# Patient Record
Sex: Female | Born: 1955 | Race: White | Hispanic: No | Marital: Married | Smoking: Never smoker
Health system: Southern US, Community
[De-identification: ages and names within clinical notes are randomized; demographics above are authoritative.]

## PROBLEM LIST (undated history)

## (undated) DIAGNOSIS — R112 Nausea with vomiting, unspecified: Secondary | ICD-10-CM

## (undated) DIAGNOSIS — J45909 Unspecified asthma, uncomplicated: Secondary | ICD-10-CM

## (undated) DIAGNOSIS — H269 Unspecified cataract: Secondary | ICD-10-CM

## (undated) DIAGNOSIS — Z87442 Personal history of urinary calculi: Secondary | ICD-10-CM

## (undated) DIAGNOSIS — R32 Unspecified urinary incontinence: Secondary | ICD-10-CM

## (undated) DIAGNOSIS — M1990 Unspecified osteoarthritis, unspecified site: Secondary | ICD-10-CM

## (undated) DIAGNOSIS — T8859XA Other complications of anesthesia, initial encounter: Secondary | ICD-10-CM

## (undated) DIAGNOSIS — Z9889 Other specified postprocedural states: Secondary | ICD-10-CM

## (undated) DIAGNOSIS — B019 Varicella without complication: Secondary | ICD-10-CM

## (undated) DIAGNOSIS — T7840XA Allergy, unspecified, initial encounter: Secondary | ICD-10-CM

## (undated) DIAGNOSIS — K219 Gastro-esophageal reflux disease without esophagitis: Secondary | ICD-10-CM

## (undated) DIAGNOSIS — G43909 Migraine, unspecified, not intractable, without status migrainosus: Secondary | ICD-10-CM

## (undated) HISTORY — DX: Unspecified urinary incontinence: R32

## (undated) HISTORY — DX: Unspecified osteoarthritis, unspecified site: M19.90

## (undated) HISTORY — PX: OTHER SURGICAL HISTORY: SHX169

## (undated) HISTORY — DX: Migraine, unspecified, not intractable, without status migrainosus: G43.909

## (undated) HISTORY — PX: BREAST CYST ASPIRATION: SHX578

## (undated) HISTORY — DX: Gastro-esophageal reflux disease without esophagitis: K21.9

## (undated) HISTORY — DX: Unspecified asthma, uncomplicated: J45.909

## (undated) HISTORY — DX: Allergy, unspecified, initial encounter: T78.40XA

## (undated) HISTORY — DX: Unspecified cataract: H26.9

## (undated) HISTORY — PX: EYE SURGERY: SHX253

## (undated) HISTORY — DX: Varicella without complication: B01.9

---

## 1985-03-24 HISTORY — PX: VAGINAL HYSTERECTOMY: SUR661

## 1990-03-24 HISTORY — PX: NASAL SINUS SURGERY: SHX719

## 1992-03-24 HISTORY — PX: ROTATOR CUFF REPAIR: SHX139

## 1997-03-24 HISTORY — PX: ELBOW SURGERY: SHX618

## 1997-03-24 HISTORY — PX: HAND SURGERY: SHX662

## 1997-10-12 ENCOUNTER — Ambulatory Visit (HOSPITAL_BASED_OUTPATIENT_CLINIC_OR_DEPARTMENT_OTHER): Admission: RE | Admit: 1997-10-12 | Discharge: 1997-10-12 | Payer: Self-pay | Admitting: Orthopedic Surgery

## 1997-11-23 ENCOUNTER — Ambulatory Visit (HOSPITAL_BASED_OUTPATIENT_CLINIC_OR_DEPARTMENT_OTHER): Admission: RE | Admit: 1997-11-23 | Discharge: 1997-11-23 | Payer: Self-pay | Admitting: Orthopedic Surgery

## 1998-04-13 ENCOUNTER — Ambulatory Visit (HOSPITAL_BASED_OUTPATIENT_CLINIC_OR_DEPARTMENT_OTHER): Admission: RE | Admit: 1998-04-13 | Discharge: 1998-04-13 | Payer: Self-pay | Admitting: Orthopedic Surgery

## 2004-10-18 ENCOUNTER — Ambulatory Visit: Payer: Self-pay | Admitting: Unknown Physician Specialty

## 2004-11-04 ENCOUNTER — Ambulatory Visit: Payer: Self-pay | Admitting: Unknown Physician Specialty

## 2005-02-07 ENCOUNTER — Ambulatory Visit: Payer: Self-pay | Admitting: Internal Medicine

## 2005-04-14 ENCOUNTER — Ambulatory Visit: Payer: Self-pay | Admitting: Unknown Physician Specialty

## 2006-02-16 ENCOUNTER — Ambulatory Visit: Payer: Self-pay | Admitting: Internal Medicine

## 2006-04-06 ENCOUNTER — Ambulatory Visit: Payer: Self-pay | Admitting: Unknown Physician Specialty

## 2007-01-08 ENCOUNTER — Ambulatory Visit: Payer: Self-pay | Admitting: Podiatry

## 2007-02-08 ENCOUNTER — Ambulatory Visit: Payer: Self-pay | Admitting: Podiatry

## 2007-02-16 ENCOUNTER — Ambulatory Visit: Payer: Self-pay | Admitting: Cardiology

## 2007-03-02 ENCOUNTER — Ambulatory Visit: Payer: Self-pay | Admitting: Internal Medicine

## 2007-05-27 ENCOUNTER — Ambulatory Visit: Payer: Self-pay | Admitting: Specialist

## 2007-10-01 ENCOUNTER — Ambulatory Visit: Payer: Self-pay | Admitting: Unknown Physician Specialty

## 2008-02-16 ENCOUNTER — Ambulatory Visit: Payer: Self-pay | Admitting: Unknown Physician Specialty

## 2008-03-02 ENCOUNTER — Ambulatory Visit: Payer: Self-pay | Admitting: Internal Medicine

## 2008-10-25 ENCOUNTER — Ambulatory Visit: Payer: Self-pay | Admitting: Internal Medicine

## 2009-03-07 ENCOUNTER — Ambulatory Visit: Payer: Self-pay | Admitting: Internal Medicine

## 2010-02-08 ENCOUNTER — Ambulatory Visit: Payer: Self-pay | Admitting: Unknown Physician Specialty

## 2010-02-11 LAB — PATHOLOGY REPORT

## 2010-03-11 ENCOUNTER — Ambulatory Visit: Payer: Self-pay | Admitting: Internal Medicine

## 2010-03-15 ENCOUNTER — Ambulatory Visit: Payer: Self-pay | Admitting: Internal Medicine

## 2011-03-19 ENCOUNTER — Ambulatory Visit: Payer: Self-pay | Admitting: Internal Medicine

## 2011-03-20 ENCOUNTER — Ambulatory Visit: Payer: Self-pay | Admitting: Internal Medicine

## 2012-01-16 ENCOUNTER — Encounter: Payer: Self-pay | Admitting: Internal Medicine

## 2012-01-16 ENCOUNTER — Ambulatory Visit (INDEPENDENT_AMBULATORY_CARE_PROVIDER_SITE_OTHER): Payer: BC Managed Care – PPO | Admitting: Internal Medicine

## 2012-01-16 VITALS — BP 112/62 | HR 72 | Temp 97.8°F | Ht 62.5 in | Wt 174.8 lb

## 2012-01-16 DIAGNOSIS — K219 Gastro-esophageal reflux disease without esophagitis: Secondary | ICD-10-CM

## 2012-01-16 DIAGNOSIS — Z139 Encounter for screening, unspecified: Secondary | ICD-10-CM

## 2012-01-16 DIAGNOSIS — N3010 Interstitial cystitis (chronic) without hematuria: Secondary | ICD-10-CM

## 2012-01-16 DIAGNOSIS — E7800 Pure hypercholesterolemia, unspecified: Secondary | ICD-10-CM

## 2012-01-16 DIAGNOSIS — Z8669 Personal history of other diseases of the nervous system and sense organs: Secondary | ICD-10-CM

## 2012-01-16 MED ORDER — ESTRADIOL 1 MG PO TABS: 1.0000 mg | ORAL_TABLET | Freq: Every day | ORAL | Status: DC

## 2012-01-16 MED ORDER — METOPROLOL TARTRATE 25 MG PO TABS: 25.0000 mg | ORAL_TABLET | Freq: Every day | ORAL | Status: DC

## 2012-01-16 NOTE — Patient Instructions (Addendum)
It was nice seeing you today.  We will get your mammogram scheduled.  We will also schedule your physical.   Let me know if you have any problems.

## 2012-01-18 ENCOUNTER — Encounter: Payer: Self-pay | Admitting: Internal Medicine

## 2012-01-18 DIAGNOSIS — K219 Gastro-esophageal reflux disease without esophagitis: Secondary | ICD-10-CM | POA: Insufficient documentation

## 2012-01-18 DIAGNOSIS — Z8669 Personal history of other diseases of the nervous system and sense organs: Secondary | ICD-10-CM | POA: Insufficient documentation

## 2012-01-18 DIAGNOSIS — N3010 Interstitial cystitis (chronic) without hematuria: Secondary | ICD-10-CM | POA: Insufficient documentation

## 2012-01-18 DIAGNOSIS — E7800 Pure hypercholesterolemia, unspecified: Secondary | ICD-10-CM | POA: Insufficient documentation

## 2012-01-18 NOTE — Assessment & Plan Note (Signed)
Reflux has been under reasonable control.  Sees Dr Markham Jordan.  Continue Dexilant.

## 2012-01-18 NOTE — Assessment & Plan Note (Signed)
Low cholesterol diet and exercise.  Check lipid panel with next fasting labs.    

## 2012-01-18 NOTE — Assessment & Plan Note (Signed)
On Topamax.  Followed by Dr Sherryll Burger.  Stable.

## 2012-01-18 NOTE — Assessment & Plan Note (Signed)
Followed by Dr Achilles Dunk.  Has follow planned 11/13.

## 2012-01-18 NOTE — Progress Notes (Signed)
  Subjective:    Patient ID: Savannah Gomez, female    DOB: 03/18/1956, 56 y.o.   MRN: [id number]  HPI 56 year old female with past history of interstitial cystitis, GERD (with Barrett's esophagitis), migraine headaches and hypercholesterolemia who comes in today for a scheduled follow up.  States she has been doing relatively well.  Headaches controlled with Topamax.  Sees Dr. Sherryll Burger.  Still some reflux issues, but has been under reasonable control with Dexilant.  Sees Dr Markham Jordan.  Still with some constipation.  Overall bowels stable.  No abdominal pain or cramping.  No nausea or vomiting.  No sob and no increased heart rate or palpitations.    Past Medical History  Diagnosis Date  . Asthma   . Arthritis   . GERD (gastroesophageal reflux disease)   . Allergy   . Chicken pox   . Migraine headache   . Urine incontinence     Review of Systems Patient denies any headache, lightheadedness or dizziness.  No significant sinus or allergy symptoms currently.  No chest pain, tightness or palpitations.  No increased shortness of breath, cough or congestion.  No nausea or vomiting.  No abdominal pain or cramping.  No bowel change, such as diarrhea, BRBPR or melana.  Some constipation.  Still has issues with her bladder.  Plans to discuss and follow up with Dr Achilles Dunk.        Objective:   Physical Exam Filed Vitals:   01/16/12 1347  BP: 112/62  Pulse: 72  Temp: 97.8 F (30.69 C)   56 year old female in no acute distress.  HEENT:  Nares - clear.  OP- without lesions or erythema.  NECK:  Supple, nontender.  No audible carotid bruit.   HEART:  Appears to be regular. LUNGS:  Without crackles or wheezing audible.  Respirations even and unlabored.   RADIAL PULSE:  Equal bilaterally.  ABDOMEN:  Soft, nontender.  No audible abdominal bruit.   EXTREMITIES:  No increased edema to be present.                     Assessment & Plan:  PULMONARY.  Currently doing well.  No sob.  Follow.   CARDIOVASCULAR.   Stable.  Has seen Dr Lady Gary.  Doing well.  Continue risk factor modification.   ESTROGEN USAGE.  Discussed with her again today.  Desires to remain on the medication.  Follow.   HEALTH MAINTENANCE.  Physical 01/29/11.  Is s/p hysterectomy and does not require yearly pap smears.  Mammogram 03/19/11 - rec ultrasound.  Ultrasound left breast (03/20/11) - Birads II.  Schedule a follow up mammogram when due.  Check cholesterol.  Colonoscopy 11/11 revealed internal hemorrhoids.  Rec follow up colonoscopy in three years.

## 2012-01-20 ENCOUNTER — Other Ambulatory Visit: Payer: Self-pay | Admitting: *Deleted

## 2012-01-20 MED ORDER — METOPROLOL TARTRATE 25 MG PO TABS: 25.0000 mg | ORAL_TABLET | Freq: Every day | ORAL | Status: DC

## 2012-02-02 ENCOUNTER — Encounter: Payer: Self-pay | Admitting: Internal Medicine

## 2012-02-03 ENCOUNTER — Encounter: Payer: Self-pay | Admitting: Internal Medicine

## 2012-02-11 DIAGNOSIS — R399 Unspecified symptoms and signs involving the genitourinary system: Secondary | ICD-10-CM | POA: Insufficient documentation

## 2012-02-11 DIAGNOSIS — N3941 Urge incontinence: Secondary | ICD-10-CM | POA: Insufficient documentation

## 2012-02-11 DIAGNOSIS — R350 Frequency of micturition: Secondary | ICD-10-CM | POA: Insufficient documentation

## 2012-02-11 DIAGNOSIS — R339 Retention of urine, unspecified: Secondary | ICD-10-CM | POA: Insufficient documentation

## 2012-03-19 ENCOUNTER — Ambulatory Visit: Payer: Self-pay | Admitting: Internal Medicine

## 2012-03-29 ENCOUNTER — Ambulatory Visit: Payer: Self-pay | Admitting: Internal Medicine

## 2012-04-01 ENCOUNTER — Telehealth: Payer: Self-pay | Admitting: Internal Medicine

## 2012-04-01 NOTE — Telephone Encounter (Signed)
Patient is wanting her test results from mammogram and ultra sound.

## 2012-04-02 NOTE — Telephone Encounter (Signed)
Dr. Lorin Picket,  Patient is wanting her test results from mammogram and ultra sound. Please advise

## 2012-04-02 NOTE — Telephone Encounter (Signed)
Dr. Lorin Picket,  Patient informed of mammogram results and  would like to wait until here appointment on 05-03-12 for her f/u breast exam. Is this okay?

## 2012-04-02 NOTE — Telephone Encounter (Signed)
yes

## 2012-04-02 NOTE — Telephone Encounter (Signed)
Patient informed of f/u appointment time

## 2012-04-02 NOTE — Telephone Encounter (Signed)
These are on michelle's desk.  Please let me know if I need to talk to pt.

## 2012-05-03 ENCOUNTER — Ambulatory Visit (INDEPENDENT_AMBULATORY_CARE_PROVIDER_SITE_OTHER): Payer: BC Managed Care – PPO | Admitting: Internal Medicine

## 2012-05-03 ENCOUNTER — Encounter: Payer: Self-pay | Admitting: Internal Medicine

## 2012-05-03 VITALS — BP 110/80 | HR 76 | Temp 97.9°F | Resp 16 | Ht 62.5 in | Wt 174.0 lb

## 2012-05-03 DIAGNOSIS — E7800 Pure hypercholesterolemia, unspecified: Secondary | ICD-10-CM

## 2012-05-03 DIAGNOSIS — R5381 Other malaise: Secondary | ICD-10-CM

## 2012-05-03 DIAGNOSIS — K219 Gastro-esophageal reflux disease without esophagitis: Secondary | ICD-10-CM

## 2012-05-03 DIAGNOSIS — R5383 Other fatigue: Secondary | ICD-10-CM

## 2012-05-03 DIAGNOSIS — N3010 Interstitial cystitis (chronic) without hematuria: Secondary | ICD-10-CM

## 2012-05-03 DIAGNOSIS — Z8669 Personal history of other diseases of the nervous system and sense organs: Secondary | ICD-10-CM

## 2012-05-04 ENCOUNTER — Encounter: Payer: Self-pay | Admitting: Internal Medicine

## 2012-05-04 NOTE — Assessment & Plan Note (Signed)
Stable on Topamax.  Sees Dr Sherryll Burger.

## 2012-05-04 NOTE — Assessment & Plan Note (Signed)
Low cholesterol diet and exercise.  Check lipid panel.   

## 2012-05-04 NOTE — Assessment & Plan Note (Signed)
Sees Dr Achilles Dunk.  Overall improved.  Still with some flares.

## 2012-05-04 NOTE — Assessment & Plan Note (Signed)
On Dexilant.  Under reasonable control.  Continues to follow up with Dr Markham Jordan.

## 2012-05-04 NOTE — Progress Notes (Signed)
Subjective:    Patient ID: Savannah Gomez, female    DOB: [birth year], 57 y.o.   MRN: [id number]  HPI 57 year old female with past history of interstitial cystitis, GERD (with Barrett's esophagitis), migraine headaches and hypercholesterolemia who comes in today to follow up on these issues as well as for a complete physical exam.  States she has been doing relatively well.  Headaches controlled with Topamax.  Sees Dr. Sherryll Burger.  Reflux under reasonable control with Dexilant.  Sees Dr Markham Jordan.  Overall bowels stable.  No abdominal pain or cramping.  No nausea or vomiting.  No sob and no increased heart rate or significant palpitations.  Had mammogram recently.  Recommended six month follow up mammo.  She has not noticed any changes in her breast.    Past Medical History  Diagnosis Date  . Asthma   . Arthritis   . GERD (gastroesophageal reflux disease)   . Allergy   . Chicken pox   . Migraine headache   . Urine incontinence     Current Outpatient Prescriptions on File Prior to Visit  Medication Sig Dispense Refill  . dexlansoprazole (DEXILANT) 60 MG capsule Take 60 mg by mouth daily.      Marland Kitchen diltiazem (CARDIZEM) 120 MG tablet Take 120 mg by mouth daily.      Marland Kitchen estradiol (ESTRACE) 1 MG tablet Take 1 tablet (1 mg total) by mouth daily. Take 2 every other day and 1 every other day  45 tablet  6  . imipramine (TOFRANIL) 25 MG tablet Take 25 mg by mouth 2 (two) times daily.      . metoprolol succinate (TOPROL-XL) 25 MG 24 hr tablet       . mirabegron ER (MYRBETRIQ) 25 MG TB24 Take 25 mg by mouth 2 (two) times daily.      Marland Kitchen topiramate (TOPAMAX) 50 MG tablet Take 50 mg by mouth 2 (two) times daily.      Marland Kitchen diltiazem (CARDIZEM CD) 120 MG 24 hr capsule       . metoprolol tartrate (LOPRESSOR) 25 MG tablet Take 1 tablet (25 mg total) by mouth daily. Take 1/2 tablet a day  30 tablet  6   No current facility-administered medications on file prior to visit.    Review of Systems Patient denies any  headache, lightheadedness or dizziness.  No significant sinus or allergy symptoms currently.  No chest pain, tightness or palpitations.  No increased shortness of breath, cough or congestion.  No nausea or vomiting.  No abdominal pain or cramping.  No bowel change, such as diarrhea, BRBPR or melana.  Still has issues with her bladder.  Follow up with Dr Achilles Dunk. Some fatigue, but overall feels she is doing well.        Objective:   Physical Exam  Filed Vitals:   05/03/12 1334  BP: 110/80  Pulse: 76  Temp: 97.9 F (36.6 C)  Resp: 16   Blood pressure recheck:  116/70, pulse 22  57 year old female in no acute distress.   HEENT:  Nares- clear.  Oropharynx - without lesions. NECK:  Supple.  Nontender.  No audible bruit.  HEART:  Appears to be regular. LUNGS:  No crackles or wheezing audible.  Respirations even and unlabored.  RADIAL PULSE:  Equal bilaterally.    BREASTS:  No nipple discharge or nipple retraction present.  Could not appreciate any distinct nodules or axillary adenopathy.  ABDOMEN:  Soft, nontender.  Bowel sounds present and normal.  No  audible abdominal bruit.  GU:  Normal external genitalia.  Vaginal vault without lesions.  S/P hysterectomy. Could not appreciate any adnexal masses or tenderness.   RECTAL:  Heme negative.   EXTREMITIES:  No increased edema present.  DP pulses palpable and equal bilaterally.             Assessment & Plan:  ABNORMAL MAMMOGRAM.  See below for details regarding mammogram.  She has noticed no change on breast exam.  Discussed options with her.  She elects to follow up with a six month mammogram.   PULMONARY.  Currently doing well.  No sob.  Follow.   CARDIOVASCULAR.  Stable.  Has seen Dr Lady Gary.  Doing well.  Continue risk factor modification.   ESTROGEN USAGE.  Discussed with her again today.  Desires to remain on the medication.  Follow.   FATIGUE.  Check cbc, met c and tsh.   HEALTH MAINTENANCE.  Physical today.  Is s/p hysterectomy and  does not require yearly pap smears.  Mammogram 03/19/11 - rec ultrasound.  Ultrasound left breast (03/20/11) - Birads II.  Had follow up mammogram 03/19/12.  Recommended follow up views.  03/29/12 follow up views recommended a six month follow up mammogram right breast.  Schedule.   Check cholesterol.  Colonoscopy 11/11 revealed internal hemorrhoids.  Rec follow up colonoscopy in three years.

## 2012-05-06 ENCOUNTER — Other Ambulatory Visit: Payer: Self-pay | Admitting: Internal Medicine

## 2012-05-06 DIAGNOSIS — R928 Other abnormal and inconclusive findings on diagnostic imaging of breast: Secondary | ICD-10-CM

## 2012-05-06 NOTE — Progress Notes (Signed)
Order placed for r breast mammo for 6 month f/u.

## 2012-05-08 ENCOUNTER — Other Ambulatory Visit: Payer: Self-pay

## 2012-05-18 ENCOUNTER — Other Ambulatory Visit: Payer: BC Managed Care – PPO

## 2012-05-21 ENCOUNTER — Other Ambulatory Visit (INDEPENDENT_AMBULATORY_CARE_PROVIDER_SITE_OTHER): Payer: BC Managed Care – PPO

## 2012-05-21 DIAGNOSIS — E7800 Pure hypercholesterolemia, unspecified: Secondary | ICD-10-CM

## 2012-05-21 DIAGNOSIS — K219 Gastro-esophageal reflux disease without esophagitis: Secondary | ICD-10-CM

## 2012-05-21 DIAGNOSIS — R5383 Other fatigue: Secondary | ICD-10-CM

## 2012-05-21 DIAGNOSIS — R5381 Other malaise: Secondary | ICD-10-CM

## 2012-05-21 LAB — CBC WITH DIFFERENTIAL/PLATELET
Basophils Relative: 0.4 % (ref 0.0–3.0)
Eosinophils Relative: 6.4 % — ABNORMAL HIGH (ref 0.0–5.0)
Lymphocytes Relative: 29.9 % (ref 12.0–46.0)
Monocytes Absolute: 0.2 10*3/uL (ref 0.1–1.0)
Monocytes Relative: 6.2 % (ref 3.0–12.0)
Neutrophils Relative %: 57.1 % (ref 43.0–77.0)
Platelets: 205 10*3/uL (ref 150.0–400.0)
RBC: 4.56 Mil/uL (ref 3.87–5.11)
WBC: 3.7 10*3/uL — ABNORMAL LOW (ref 4.5–10.5)

## 2012-05-21 LAB — LIPID PANEL
HDL: 75.4 mg/dL (ref >39.00)
Total CHOL/HDL Ratio: 2
Triglycerides: 44 mg/dL (ref 0.0–149.0)
VLDL: 8.8 mg/dL (ref 0.0–40.0)

## 2012-05-21 LAB — COMPREHENSIVE METABOLIC PANEL
Albumin: 3.9 g/dL (ref 3.5–5.2)
Alkaline Phosphatase: 81 U/L (ref 39–117)
CO2: 25 mEq/L (ref 19–32)
Calcium: 9.1 mg/dL (ref 8.4–10.5)
Chloride: 109 mEq/L (ref 96–112)
Glucose, Bld: 99 mg/dL (ref 70–99)
Potassium: 3.8 mEq/L (ref 3.5–5.1)
Sodium: 140 mEq/L (ref 135–145)
Total Protein: 6.5 g/dL (ref 6.0–8.3)

## 2012-05-21 LAB — TSH: TSH: 1.61 u[IU]/mL (ref 0.35–5.50)

## 2012-05-22 ENCOUNTER — Telehealth: Payer: Self-pay | Admitting: Internal Medicine

## 2012-05-22 DIAGNOSIS — D72819 Decreased white blood cell count, unspecified: Secondary | ICD-10-CM

## 2012-05-22 NOTE — Telephone Encounter (Signed)
Pt notified of labs via my chart and needs to come in for a follow up blood test in the next 3 weeks.  Please schedule her for a non fasting lab in 3 weeks.  Thanks.  Please call her with an appt time.

## 2012-05-24 ENCOUNTER — Encounter: Payer: Self-pay | Admitting: Internal Medicine

## 2012-05-26 NOTE — Telephone Encounter (Signed)
scheduled

## 2012-06-04 ENCOUNTER — Other Ambulatory Visit (INDEPENDENT_AMBULATORY_CARE_PROVIDER_SITE_OTHER): Payer: BC Managed Care – PPO

## 2012-06-04 DIAGNOSIS — D72819 Decreased white blood cell count, unspecified: Secondary | ICD-10-CM

## 2012-06-04 LAB — CBC WITH DIFFERENTIAL/PLATELET
Basophils Absolute: 0 10*3/uL (ref 0.0–0.1)
HCT: 39.1 % (ref 36.0–46.0)
Lymphs Abs: 1.3 10*3/uL (ref 0.7–4.0)
MCV: 86.3 fl (ref 78.0–100.0)
Monocytes Absolute: 0.3 10*3/uL (ref 0.1–1.0)
Neutrophils Relative %: 55.1 % (ref 43.0–77.0)
Platelets: 225 10*3/uL (ref 150.0–400.0)
RDW: 13.9 % (ref 11.5–14.6)
WBC: 4.1 10*3/uL — ABNORMAL LOW (ref 4.5–10.5)

## 2012-06-09 ENCOUNTER — Telehealth: Payer: Self-pay | Admitting: Internal Medicine

## 2012-06-09 DIAGNOSIS — D72819 Decreased white blood cell count, unspecified: Secondary | ICD-10-CM

## 2012-06-09 NOTE — Telephone Encounter (Signed)
I can see her tomorrow at 11:30 - if ok to wait until tomorrow.  Work in for this.

## 2012-06-09 NOTE — Telephone Encounter (Signed)
Left message for pt to call office please schedule the follow appointments

## 2012-06-09 NOTE — Telephone Encounter (Signed)
Patient called this morning to see if she could be seen today or tomorrow. She isn't feeling well and has a low grade fever? Is there any way we can work her in?    -AB

## 2012-06-09 NOTE — Telephone Encounter (Signed)
Patient will be here tomorrow at 11:30 am.

## 2012-06-09 NOTE — Telephone Encounter (Signed)
Pt notified of labs via my chart.  She needs a follow up non fasting lab in 3-4 months.  Please schedule and call pt with an appt time.  She also needs a six month follow up appt scheduled with me.  Thanks.

## 2012-06-10 ENCOUNTER — Ambulatory Visit (INDEPENDENT_AMBULATORY_CARE_PROVIDER_SITE_OTHER): Payer: BC Managed Care – PPO | Admitting: Internal Medicine

## 2012-06-10 ENCOUNTER — Encounter: Payer: Self-pay | Admitting: Internal Medicine

## 2012-06-10 VITALS — BP 110/80 | HR 76 | Temp 98.3°F | Ht 62.5 in | Wt 174.0 lb

## 2012-06-10 DIAGNOSIS — R000 Tachycardia, unspecified: Secondary | ICD-10-CM

## 2012-06-10 DIAGNOSIS — R002 Palpitations: Secondary | ICD-10-CM

## 2012-06-10 DIAGNOSIS — Z8669 Personal history of other diseases of the nervous system and sense organs: Secondary | ICD-10-CM

## 2012-06-10 DIAGNOSIS — K219 Gastro-esophageal reflux disease without esophagitis: Secondary | ICD-10-CM

## 2012-06-11 ENCOUNTER — Encounter: Payer: Self-pay | Admitting: Internal Medicine

## 2012-06-11 NOTE — Progress Notes (Signed)
Subjective:    Patient ID: Savannah Gomez, female    DOB: 11/02/1955, 57 y.o.   MRN: [id number]  HPI 57 year old female with past history of interstitial cystitis, GERD (with Barrett's esophagitis), migraine headaches and hypercholesterolemia who comes in today as a work in with concerns regarding increased fatigue and no energy.  States symptoms started several days ago with a headache.  She then experienced some nausea and chills.  The headache subsided and subsequently the nausea and chills subsided.  She noticed some minimal lightheadedness.  This has also occurred on a few occasions prior to this week.  She is eating and drinking.  No vomiting or diarrhea.  No abdominal pain. Feels her bowels are stable.  Her concern is the increased fatigue and decreased energy.  Noticed getting fatigued trying to get a shower and while getting ready to go to work.  No chest pain.  She has noticed some intermittent episodes of heart palpitations and increased heart racing.  This has occurred prior to this week as well.    Past Medical History  Diagnosis Date  . Asthma   . Arthritis   . GERD (gastroesophageal reflux disease)   . Allergy   . Chicken pox   . Migraine headache   . Urine incontinence     Current Outpatient Prescriptions on File Prior to Visit  Medication Sig Dispense Refill  . acetaminophen (TYLENOL) 500 MG tablet Take 500 mg by mouth every 6 (six) hours as needed for pain.      Marland Kitchen dexlansoprazole (DEXILANT) 60 MG capsule Take 60 mg by mouth daily.      Marland Kitchen diltiazem (CARDIZEM CD) 120 MG 24 hr capsule       . diltiazem (CARDIZEM) 120 MG tablet Take 120 mg by mouth daily.      Marland Kitchen estradiol (ESTRACE) 1 MG tablet Take 1 tablet (1 mg total) by mouth daily. Take 2 every other day and 1 every other day  45 tablet  6  . imipramine (TOFRANIL) 25 MG tablet Take 25 mg by mouth 2 (two) times daily.      . metoprolol succinate (TOPROL-XL) 25 MG 24 hr tablet       . metoprolol tartrate (LOPRESSOR) 25  MG tablet Take 1 tablet (25 mg total) by mouth daily. Take 1/2 tablet a day  30 tablet  6  . mirabegron ER (MYRBETRIQ) 25 MG TB24 Take 25 mg by mouth 2 (two) times daily.      Marland Kitchen topiramate (TOPAMAX) 50 MG tablet Take 50 mg by mouth 2 (two) times daily.       No current facility-administered medications on file prior to visit.    Review of Systems Patient denies any headache now.  Occasional lightheadedness/dizziness.  No significant sinus or allergy symptoms currently.  No chest pain, tightness.  Some increased heart racing and palpitations.  No increased shortness of breath, cough or congestion.  No nausea now.  Nausea resolved.  No vomiting.  No abdominal pain or cramping.  No bowel change, such as diarrhea, BRBPR or melana.  Still has issues with her bladder.  Increased fatigue and decreased energy.  Questionable bite - upper right arm.  No surrounding erythema.         Objective:   Physical Exam  Filed Vitals:   06/10/12 1136  BP: 110/80  Pulse: 76  Temp: 98.3 F (36.8 C)   Blood pressure recheck:  Not orthostatic on exam.   57 year old female in  no acute distress.   HEENT:  Nares- clear.  Oropharynx - without lesions. NECK:  Supple.  Nontender.  No audible bruit.  HEART:  Appears to be regular. LUNGS:  No crackles or wheezing audible.  Respirations even and unlabored.  RADIAL PULSE:  Equal bilaterally. ABDOMEN:  Soft, nontender.  Bowel sounds present and normal.  No audible abdominal bruit.    EXTREMITIES:  No increased edema present.  DP pulses palpable and equal bilaterally.             Assessment & Plan:  PULMONARY.  Currently doing well.  No sob.  Follow.   CARDIOVASCULAR.  Given the palpitations and increased heart rate along with the increased fatigue and decreased energy, EKG obtained and revealed SR with no acute ischemic changes.  Discussed further options regarding w/up.  Will refer back to Dr Lady Gary for further evaluation with question of need for repeat ECHO/holter  monitor, etc.  Pt comfortable with this plan.     FATIGUE.  Symptoms as outlined.  Recent cbc, met c and tsh within normal limits.  Will pursue cardiac w/up as outlined.  Possible viral syndrome.  The nausea, chills and headache have resolved.  No other infectious symptoms identified.  Hold on abx or further lab testing.  Not orthostatic on exam.  Stay hydrated.  Follow.  Notify me if symptoms change, worsen or do not resolve.    HEALTH MAINTENANCE.  Physical last visit.  Is s/p hysterectomy and does not require yearly pap smears.  Mammogram 03/19/11 - rec ultrasound.  Ultrasound left breast (03/20/11) - Birads II.  Had follow up mammogram 03/19/12.  Recommended follow up views.  03/29/12 follow up views recommended a six month follow up mammogram right breast.  Schedule.   Colonoscopy 11/11 revealed internal hemorrhoids.  Rec follow up colonoscopy in three years.

## 2012-06-11 NOTE — Assessment & Plan Note (Signed)
On Dexilant.  Under reasonable control.  Continues to follow up with Dr Markham Jordan.

## 2012-06-11 NOTE — Assessment & Plan Note (Signed)
Stable on Topamax.  Sees Dr Sherryll Burger.  Headache has resolved.

## 2012-06-16 ENCOUNTER — Other Ambulatory Visit: Payer: BC Managed Care – PPO

## 2012-09-09 ENCOUNTER — Other Ambulatory Visit: Payer: Self-pay | Admitting: Internal Medicine

## 2012-09-13 ENCOUNTER — Other Ambulatory Visit: Payer: Self-pay | Admitting: Internal Medicine

## 2012-09-15 ENCOUNTER — Other Ambulatory Visit (INDEPENDENT_AMBULATORY_CARE_PROVIDER_SITE_OTHER): Payer: BC Managed Care – PPO

## 2012-09-15 DIAGNOSIS — D72819 Decreased white blood cell count, unspecified: Secondary | ICD-10-CM

## 2012-09-16 LAB — CBC WITH DIFFERENTIAL/PLATELET
Eosinophils Relative: 11.3 % — ABNORMAL HIGH (ref 0.0–5.0)
Lymphocytes Relative: 32.7 % (ref 12.0–46.0)
Monocytes Absolute: 0.3 10*3/uL (ref 0.1–1.0)
Monocytes Relative: 6.5 % (ref 3.0–12.0)
Neutrophils Relative %: 47.9 % (ref 43.0–77.0)
Platelets: 213 10*3/uL (ref 150.0–400.0)
WBC: 5.3 10*3/uL (ref 4.5–10.5)

## 2012-09-17 ENCOUNTER — Encounter: Payer: Self-pay | Admitting: Internal Medicine

## 2012-09-28 ENCOUNTER — Ambulatory Visit: Payer: Self-pay | Admitting: Internal Medicine

## 2012-10-12 ENCOUNTER — Encounter: Payer: Self-pay | Admitting: *Deleted

## 2012-11-15 ENCOUNTER — Encounter: Payer: Self-pay | Admitting: Internal Medicine

## 2012-12-13 ENCOUNTER — Ambulatory Visit: Payer: BC Managed Care – PPO | Admitting: Internal Medicine

## 2013-01-03 ENCOUNTER — Ambulatory Visit (INDEPENDENT_AMBULATORY_CARE_PROVIDER_SITE_OTHER): Payer: BC Managed Care – PPO | Admitting: Internal Medicine

## 2013-01-03 ENCOUNTER — Encounter: Payer: Self-pay | Admitting: Internal Medicine

## 2013-01-03 VITALS — BP 110/80 | HR 66 | Temp 98.1°F | Ht 62.5 in | Wt 168.5 lb

## 2013-01-03 DIAGNOSIS — E7800 Pure hypercholesterolemia, unspecified: Secondary | ICD-10-CM

## 2013-01-03 DIAGNOSIS — Z8669 Personal history of other diseases of the nervous system and sense organs: Secondary | ICD-10-CM

## 2013-01-03 DIAGNOSIS — N3010 Interstitial cystitis (chronic) without hematuria: Secondary | ICD-10-CM

## 2013-01-03 DIAGNOSIS — K219 Gastro-esophageal reflux disease without esophagitis: Secondary | ICD-10-CM

## 2013-01-09 ENCOUNTER — Encounter: Payer: Self-pay | Admitting: Internal Medicine

## 2013-01-09 NOTE — Progress Notes (Signed)
Subjective:    Patient ID: Savannah Gomez, female    DOB: [birth year], 57 y.o.   MRN: [id number]  HPI 57 year old female with past history of interstitial cystitis, GERD (with Barrett's esophagitis), migraine headaches and hypercholesterolemia who comes in today for a scheduled follow up.  Overall doing well.  Feels from a cardiac standpoint things are stable.  Breathing stable.  Still some issues with her acid reflux and some regurgitation.  Planning for f/u in 11/14.  No abdominal pain or cramping.  Bowels stable.     Past Medical History  Diagnosis Date  . Asthma   . Arthritis   . GERD (gastroesophageal reflux disease)   . Allergy   . Chicken pox   . Migraine headache   . Urine incontinence     Current Outpatient Prescriptions on File Prior to Visit  Medication Sig Dispense Refill  . acetaminophen (TYLENOL) 500 MG tablet Take 500 mg by mouth every 6 (six) hours as needed for pain.      Marland Kitchen dexlansoprazole (DEXILANT) 60 MG capsule Take 60 mg by mouth daily.      Marland Kitchen diltiazem (CARDIZEM CD) 120 MG 24 hr capsule       . estradiol (ESTRACE) 1 MG tablet TAKE 2 TABLETS BY MOUTH EVERY OTHER DAY AND 1 TABLET EVERY OTHER DAY  45 tablet  11  . imipramine (TOFRANIL) 25 MG tablet Take 25 mg by mouth 2 (two) times daily.      . metoprolol succinate (TOPROL-XL) 25 MG 24 hr tablet TAKE 1/2 TABLET BY MOUTH ONCE DAILY AS DIRECTED.  15 tablet  5  . mirabegron ER (MYRBETRIQ) 25 MG TB24 Take 25 mg by mouth 2 (two) times daily.      Marland Kitchen topiramate (TOPAMAX) 50 MG tablet Take 50 mg by mouth 2 (two) times daily.       No current facility-administered medications on file prior to visit.    Review of Systems Patient denies any headache.   No dizziness.   No significant sinus or allergy symptoms currently.  No chest pain, tightness.  No increased heart rate or palpitations.   No increased shortness of breath, cough or congestion.  No nausea. No vomiting.  No abdominal pain or cramping.  No bowel change, such  as diarrhea, BRBPR or melana.  Still has issues with her bladder.  Acid reflux and upper GI symptoms as outlined.  Has f/u with GI next month.          Objective:   Physical Exam  Filed Vitals:   01/03/13 1641  BP: 110/80  Pulse: 66  Temp: 98.1 F (36.7 C)   Blood pressure recheck:  19/52  56 year old female in no acute distress.   HEENT:  Nares- clear.  Oropharynx - without lesions. NECK:  Supple.  Nontender.  No audible bruit.  HEART:  Appears to be regular. LUNGS:  No crackles or wheezing audible.  Respirations even and unlabored.  RADIAL PULSE:  Equal bilaterally. ABDOMEN:  Soft, nontender.  Bowel sounds present and normal.  No audible abdominal bruit.    EXTREMITIES:  No increased edema present.  DP pulses palpable and equal bilaterally.             Assessment & Plan:  PULMONARY.  Currently doing well.  No sob.  Follow.   CARDIOVASCULAR.  Stable.  Sees Dr Lady Gary.     HEALTH MAINTENANCE.  Physical 05/03/12.  Is s/p hysterectomy and does not require yearly pap smears.  Mammogram 03/19/11 - rec ultrasound.  Ultrasound left breast (03/20/11) - Birads II.  Had follow up mammogram 03/19/12.  Recommended follow up views.  03/29/12 follow up views recommended a six month follow up mammogram right breast.  Follow up mammogram 09/28/12 - Birads II.  Colonoscopy 11/11 revealed internal hemorrhoids.  Rec follow up colonoscopy in three years.

## 2013-01-09 NOTE — Assessment & Plan Note (Signed)
Stable on Topamax.  Sees Dr Shah.  Headaches better.   

## 2013-01-09 NOTE — Assessment & Plan Note (Signed)
Low cholesterol diet and exercise.  Follow lipid panel.   

## 2013-01-09 NOTE — Assessment & Plan Note (Signed)
On Dexilant.  Under reasonable control.  Continues to follow up with Dr Markham Jordan.  Some persistent symptoms.  Planning to f/u with Dr Markham Jordan next month.  Has colonoscopy planned.  Will see if EGD can be done at that time.

## 2013-01-09 NOTE — Assessment & Plan Note (Signed)
Sees Dr Achilles Dunk.  Overall improved.  Still with some flares.

## 2013-02-19 ENCOUNTER — Ambulatory Visit: Payer: Self-pay | Admitting: Physician Assistant

## 2013-02-23 ENCOUNTER — Encounter: Payer: Self-pay | Admitting: Adult Health

## 2013-02-23 ENCOUNTER — Ambulatory Visit (INDEPENDENT_AMBULATORY_CARE_PROVIDER_SITE_OTHER): Payer: BC Managed Care – PPO | Admitting: Adult Health

## 2013-02-23 VITALS — BP 126/80 | HR 84 | Temp 97.6°F | Resp 14 | Wt 168.0 lb

## 2013-02-23 DIAGNOSIS — R5383 Other fatigue: Secondary | ICD-10-CM

## 2013-02-23 DIAGNOSIS — R5381 Other malaise: Secondary | ICD-10-CM

## 2013-02-23 LAB — COMPREHENSIVE METABOLIC PANEL
Albumin: 4.1 g/dL (ref 3.5–5.2)
CO2: 25 mEq/L (ref 19–32)
GFR: 52.99 mL/min — ABNORMAL LOW (ref >60.00)
Glucose, Bld: 97 mg/dL (ref 70–99)
Sodium: 139 mEq/L (ref 135–145)
Total Bilirubin: 0.5 mg/dL (ref 0.3–1.2)
Total Protein: 6.7 g/dL (ref 6.0–8.3)

## 2013-02-23 LAB — CBC WITH DIFFERENTIAL/PLATELET
Basophils Relative: 0.4 % (ref 0.0–3.0)
Eosinophils Relative: 6.1 % — ABNORMAL HIGH (ref 0.0–5.0)
HCT: 40.5 % (ref 36.0–46.0)
Hemoglobin: 13.6 g/dL (ref 12.0–15.0)
Lymphs Abs: 1.8 10*3/uL (ref 0.7–4.0)
Monocytes Relative: 6.8 % (ref 3.0–12.0)
Platelets: 227 10*3/uL (ref 150.0–400.0)
RBC: 4.62 Mil/uL (ref 3.87–5.11)
WBC: 5.2 10*3/uL (ref 4.5–10.5)

## 2013-02-23 LAB — TSH: TSH: 2.22 u[IU]/mL (ref 0.35–5.50)

## 2013-02-23 NOTE — Progress Notes (Signed)
   Subjective:    Patient ID: Savannah Gomez, female    DOB: [birth year], 57 y.o.   MRN: [id number]  HPI Patient is a pleasant 57 year old female who presents to clinic with complaints of fatigue. She reports having an injury to her right index finger which required stitches. Patient received a tetanus vaccine on Saturday. She began to experience some fatigue on Monday. No other symptoms reported.  Current Outpatient Prescriptions on File Prior to Visit  Medication Sig Dispense Refill  . acetaminophen (TYLENOL) 500 MG tablet Take 500 mg by mouth every 6 (six) hours as needed for pain.      Marland Kitchen dexlansoprazole (DEXILANT) 60 MG capsule Take 60 mg by mouth daily.      Marland Kitchen diltiazem (CARDIZEM CD) 120 MG 24 hr capsule       . estradiol (ESTRACE) 1 MG tablet TAKE 2 TABLETS BY MOUTH EVERY OTHER DAY AND 1 TABLET EVERY OTHER DAY  45 tablet  11  . imipramine (TOFRANIL) 25 MG tablet Take 25 mg by mouth 2 (two) times daily.      . metoprolol succinate (TOPROL-XL) 25 MG 24 hr tablet TAKE 1/2 TABLET BY MOUTH ONCE DAILY AS DIRECTED.  15 tablet  5  . mirabegron ER (MYRBETRIQ) 25 MG TB24 Take 25 mg by mouth 2 (two) times daily.      Marland Kitchen topiramate (TOPAMAX) 50 MG tablet Take 50 mg by mouth 2 (two) times daily.       No current facility-administered medications on file prior to visit.     Review of Systems  Constitutional: Positive for fatigue. Negative for fever and chills.  HENT: Negative.   Eyes: Negative.   Respiratory: Negative.   Cardiovascular: Negative.   Gastrointestinal: Negative.   Genitourinary: Negative.   Musculoskeletal: Negative.  Negative for arthralgias and myalgias.  Neurological: Negative.   Psychiatric/Behavioral: Negative.        Objective:   Physical Exam  Constitutional: She is oriented to person, place, and time. She appears well-developed and well-nourished. No distress.  HENT:  Head: Normocephalic and atraumatic.  Mouth/Throat: Oropharynx is clear and moist. No  oropharyngeal exudate.  Cardiovascular: Normal rate, regular rhythm and normal heart sounds.  Exam reveals no gallop and no friction rub.   No murmur heard. Pulmonary/Chest: Effort normal and breath sounds normal. No respiratory distress. She has no wheezes. She has no rales. She exhibits no tenderness.  Musculoskeletal: Normal range of motion.  Right index finger with stitches in place. No s/s infection. Skin is well approximated.  Neurological: She is alert and oriented to person, place, and time.  Skin: Skin is warm and dry.  Psychiatric: She has a normal mood and affect. Her behavior is normal. Judgment and thought content normal.    BP 126/80  Pulse 84  Temp(Src) 97.6 F (36.4 C) (Oral)  Resp 14  Wt 168 lb (76.204 kg)  SpO2 97%       Assessment & Plan:

## 2013-02-23 NOTE — Progress Notes (Signed)
Pre visit review using our clinic review tool, if applicable. No additional management support is needed unless otherwise documented below in the visit note. 

## 2013-02-23 NOTE — Patient Instructions (Signed)
  Please have your labs drawn prior to leaving the office.  I will contact you with the results once they are available.

## 2013-02-23 NOTE — Assessment & Plan Note (Signed)
Assured patient that fatigue is a common side effect after receiving the tetanus vaccine. Will check cbc, tsh, cmet to r/o any physiolologic reasons for fatigue.

## 2013-02-24 ENCOUNTER — Telehealth: Payer: Self-pay | Admitting: Internal Medicine

## 2013-02-24 NOTE — Telephone Encounter (Signed)
Wanting lab results

## 2013-02-25 NOTE — Telephone Encounter (Signed)
I reviewed her chart.  It appears that Savannah Gomez saw her on 02/23/13.  She was having issues with fatigue.  Labs drawn then.  Her hgb is normal.  Her thyroid function is normal.  Her kidney function is ok, but she does need to stay hydrated.  Let us know if she needs anything.

## 2013-02-25 NOTE — Telephone Encounter (Signed)
LMTCB/lsw 

## 2013-02-25 NOTE — Telephone Encounter (Signed)
The patient called back wants you to call her on her home phone.

## 2013-02-25 NOTE — Telephone Encounter (Signed)
Pt has mychart-lab not viewable & no recommendations have been given

## 2013-02-25 NOTE — Telephone Encounter (Signed)
Pt notified of lab results & recommendations

## 2013-03-09 ENCOUNTER — Other Ambulatory Visit: Payer: Self-pay | Admitting: Internal Medicine

## 2013-03-23 ENCOUNTER — Ambulatory Visit: Payer: Self-pay | Admitting: Internal Medicine

## 2013-03-23 LAB — HM MAMMOGRAPHY: HM Mammogram: NEGATIVE

## 2013-03-28 ENCOUNTER — Encounter: Payer: Self-pay | Admitting: Internal Medicine

## 2013-06-07 ENCOUNTER — Ambulatory Visit (INDEPENDENT_AMBULATORY_CARE_PROVIDER_SITE_OTHER): Payer: BC Managed Care – PPO | Admitting: Adult Health

## 2013-06-07 ENCOUNTER — Encounter: Payer: Self-pay | Admitting: Adult Health

## 2013-06-07 VITALS — BP 112/72 | HR 77 | Temp 98.3°F | Resp 12 | Wt 167.0 lb

## 2013-06-07 DIAGNOSIS — R6889 Other general symptoms and signs: Secondary | ICD-10-CM

## 2013-06-07 LAB — POCT RAPID STREP A (OFFICE): Rapid Strep A Screen: NEGATIVE

## 2013-06-07 LAB — POCT INFLUENZA A/B
INFLUENZA A, POC: NEGATIVE
INFLUENZA B, POC: NEGATIVE

## 2013-06-07 MED ORDER — AMOXICILLIN-POT CLAVULANATE 875-125 MG PO TABS: 1.0000 | ORAL_TABLET | Freq: Two times a day (BID) | ORAL | Status: DC

## 2013-06-07 NOTE — Progress Notes (Signed)
Pre visit review using our clinic review tool, if applicable. No additional management support is needed unless otherwise documented below in the visit note. 

## 2013-06-07 NOTE — Patient Instructions (Signed)
  Start Augmentin twice a day for 7 days.  Drink plenty of fluids.  Over the counter medication such as delsym for cough, tylenol for HA and fever.  Return to clinic if no improvement within 4-5 days.

## 2013-06-07 NOTE — Progress Notes (Signed)
Patient ID: Savannah NeighborsCynthia H Mcinnis, female   DOB: May 14, 1955, 58 y.o.   MRN: [id number]    Subjective:    Patient ID: Savannah NeighborsCynthia H Scalici, female    DOB: May 14, 1955, 58 y.o.   MRN: [id number]  HPI  Pt is a pleasant 58 y/o female who presents to clinic with flu-like symptoms of fever, chills, cough, HA and sore throat. Symptoms started on Friday. She reports taking tylenol for her HA but not any other medication. Her husband has been sick. Mrs. Feggins reports bloody drainage from sinuses at times. She also reports feeling her left ear popping.   Past Medical History  Diagnosis Date  . Asthma   . Arthritis   . GERD (gastroesophageal reflux disease)   . Allergy   . Chicken pox   . Migraine headache   . Urine incontinence     Current Outpatient Prescriptions on File Prior to Visit  Medication Sig Dispense Refill  . acetaminophen (TYLENOL) 500 MG tablet Take 500 mg by mouth every 6 (six) hours as needed for pain.      Marland Kitchen. dexlansoprazole (DEXILANT) 60 MG capsule Take 60 mg by mouth daily.      Marland Kitchen. diltiazem (CARDIZEM CD) 120 MG 24 hr capsule       . estradiol (ESTRACE) 1 MG tablet TAKE 2 TABLETS BY MOUTH EVERY OTHER DAY AND 1 TABLET EVERY OTHER DAY  45 tablet  11  . imipramine (TOFRANIL) 25 MG tablet Take 25 mg by mouth 2 (two) times daily.      . metoprolol succinate (TOPROL-XL) 25 MG 24 hr tablet TAKE 1/2 TABLET BY MOUTH ONCE DAILY AS DIRECTED.  15 tablet  5  . mirabegron ER (MYRBETRIQ) 25 MG TB24 Take 25 mg by mouth 2 (two) times daily.      Marland Kitchen. topiramate (TOPAMAX) 50 MG tablet Take 50 mg by mouth 2 (two) times daily.       No current facility-administered medications on file prior to visit.     Review of Systems Positive for fever, chills, cough, bloody drainage from sinuses, pressure in left ear, sore throat and HA. Negative for shortness of breath    Objective:  BP 112/72  Pulse 77  Temp(Src) 98.3 F (36.8 C) (Oral)  Resp 12  Wt 167 lb (75.751 kg)  SpO2 98%   Physical Exam    Constitutional: She is oriented to person, place, and time.  Appears acutely ill  HENT:  Head: Normocephalic and atraumatic.  Right Ear: External ear normal.  Left ear canal erythematous. TM is also slightly erythematous. Pharynx is erythematous. There is no exudate.  Cardiovascular: Normal rate, regular rhythm, normal heart sounds and intact distal pulses.  Exam reveals no gallop and no friction rub.   No murmur heard. Pulmonary/Chest: Effort normal and breath sounds normal. No respiratory distress. She has no wheezes. She has no rales.  Musculoskeletal: Normal range of motion.  Neurological: She is alert and oriented to person, place, and time.  Skin: Skin is warm and dry.  Psychiatric: She has a normal mood and affect. Her behavior is normal. Judgment and thought content normal.       Assessment & Plan:   1. Flu-like symptoms Both tests of influenza and strep were negative. Will treat for sinus infection with Augmentin. RTC if not improvement within 4/5 days. - POCT Influenza A/B - POCT rapid strep A

## 2013-06-09 ENCOUNTER — Telehealth: Payer: Self-pay | Admitting: Internal Medicine

## 2013-06-09 ENCOUNTER — Telehealth: Payer: Self-pay | Admitting: Adult Health

## 2013-06-09 NOTE — Telephone Encounter (Signed)
Patient Information:  Caller Name: Aram BeechamCynthia  Phone: [phone]  Patient: Savannah NeighborsBennett, Brayli Gomez  Gender: Female  DOB: [birth year]  Age: 58 Years  PCP: Dale DurhamScott, Charlene  Office Follow Up:  Does the office need to follow up with this patient?: Yes  Instructions For The Office: [phone].  Patient is requesting medication or appt to be evaluated. Scheduled booked.  Please review and contact patient.  RN Note:  [phone]- [phone].  Patient is requesting medication or appt to be evaluated. Scheduled booked.  Please review and contact patient.  Symptoms  Reason For Call & Symptoms: Patient states she saw Raquel Rey on Tuesday 06/07/13. Dx with  left ear infection and sore throat. She is currently on Augmentin for 7 days.  She states she is not better.  Now both ears are hurting "head in a barrell", +sore throat, +headache.  +nasal drainage green, +cough non productive.  No fever but chills.  (2) She is also experiencing a shingles out break- currently on Acyclovir since Monday 06/06/13  Reviewed Health History In EMR: Yes  Reviewed Medications In EMR: Yes  Reviewed Allergies In EMR: Yes  Reviewed Surgeries / Procedures: Yes  Date of Onset of Symptoms: 06/04/2013  Treatments Tried: Augmentin, robitussion, Tylenol  Treatments Tried Worked: No  Guideline(s) Used:  Cough  Disposition Per Guideline:   See Today or Tomorrow in Office  Reason For Disposition Reached:   Patient wants to be seen  Advice Given:  Cough Medicines:  Home Remedy - Hard Candy: Hard candy works just as well as medicine-flavored OTC cough drops. Diabetics should use sugar-free candy.  Home Remedy - Honey: This old home remedy has been shown to help decrease coughing at night. The adult dosage is 2 teaspoons (10 ml) at bedtime. Honey should not be given to infants under one year of age.  Reassurance  Coughing is the way that our lungs remove irritants  and mucus. It helps protect our lungs from getting pneumonia.  Coughing Spasms:  Drink warm fluids. Inhale warm mist (Reason: both relax the airway and loosen up the phlegm).  Suck on cough drops or hard candy to coat the irritated throat.  Prevent Dehydration:  Drink adequate liquids.  This will help soothe an irritated or dry throat and loosen up the phlegm.  Call Back If:  Difficulty breathing  You become worse.  Fever Medicines:  For fevers above 101 F (38.3 C) take either acetaminophen or ibuprofen.  They are over-the-counter (OTC) drugs that help treat both fever and pain. You can buy them at the drugstore.l  Acetaminophen (e.g., Tylenol):  Regular Strength Tylenol: Take 650 mg (two 325 mg pills) by mouth every 4-6 hours as needed. Each Regular Strength Tylenol pill has 325 mg of acetaminophen.  Ibuprofen (e.g., Motrin, Advil):  Take 400 mg (two 200 mg pills) by mouth every 6 hours.  RN Overrode Recommendation:  Patient Requests Prescription  [phone].  Patient is requesting medication or appt to be evaluated. Scheduled booked.  Please review and contact patient.

## 2013-06-09 NOTE — Telephone Encounter (Signed)
Spoke with pt, will take 8:00 appt tomorrow.

## 2013-06-09 NOTE — Telephone Encounter (Signed)
Please advise 

## 2013-06-09 NOTE — Telephone Encounter (Signed)
error 

## 2013-06-09 NOTE — Telephone Encounter (Signed)
Please call pt and see if she is ok if I see her at 8:00 in the morning.  Will work her in for this.  (please block the 30 minute spot)

## 2013-06-10 ENCOUNTER — Encounter: Payer: Self-pay | Admitting: Internal Medicine

## 2013-06-10 ENCOUNTER — Ambulatory Visit (INDEPENDENT_AMBULATORY_CARE_PROVIDER_SITE_OTHER): Payer: BC Managed Care – PPO | Admitting: Internal Medicine

## 2013-06-10 VITALS — HR 85 | Temp 98.0°F | Ht 62.5 in | Wt 165.2 lb

## 2013-06-10 DIAGNOSIS — J069 Acute upper respiratory infection, unspecified: Secondary | ICD-10-CM

## 2013-06-10 MED ORDER — AMOXICILLIN-POT CLAVULANATE 875-125 MG PO TABS
1.0000 | ORAL_TABLET | Freq: Two times a day (BID) | ORAL | Status: DC
Start: 2013-06-10 — End: 2014-05-02

## 2013-06-10 MED ORDER — PREDNISONE 10 MG PO TABS: ORAL_TABLET | ORAL | Status: DC

## 2013-06-10 NOTE — Patient Instructions (Signed)
Saline nasal spray - flush nose at least 2-3x/day.  Nasacort - 2 sprays each nostril one time per day. (do this in the evening).   Continue Robitussin.

## 2013-06-10 NOTE — Progress Notes (Signed)
Pre-visit discussion using our clinic review tool. No additional management support is needed unless otherwise documented below in the visit note.  

## 2013-06-12 ENCOUNTER — Encounter: Payer: Self-pay | Admitting: Internal Medicine

## 2013-06-12 NOTE — Progress Notes (Signed)
Subjective:    Patient ID: Savannah Gomez, female    DOB: [birth year], 58 y.o.   MRN: [id number]  Otalgia  Associated symptoms include coughing.  Sore Throat  Associated symptoms include coughing and ear pain.  Cough Associated symptoms include ear pain.  58 year old female with past history of interstitial cystitis, GERD (with Barrett's esophagitis), migraine headaches and hypercholesterolemia who comes in today as a work in with concerns regarding increased cough and sore throat.  Also some left ear discomfort.  Feels like her head is in a barrell.  Some wheezing.  Is on augmentin now.  Started the abx 06/07/13.  See Raquel's note for details.  No vomiting or diarrhea.  Has shingles as well.  On valtrex.  Does feel some better today, but still persistent cough and symptoms as outlined.       Past Medical History  Diagnosis Date  . Asthma   . Arthritis   . GERD (gastroesophageal reflux disease)   . Allergy   . Chicken pox   . Migraine headache   . Urine incontinence     Current Outpatient Prescriptions on File Prior to Visit  Medication Sig Dispense Refill  . acetaminophen (TYLENOL) 500 MG tablet Take 500 mg by mouth every 6 (six) hours as needed for pain.      . calcium carbonate (CALCIUM 600) 600 MG TABS tablet Take 600 mg by mouth daily with breakfast.      . cetirizine (ZYRTEC) 10 MG tablet Take 10 mg by mouth daily.      Marland Kitchen dexlansoprazole (DEXILANT) 60 MG capsule Take 60 mg by mouth daily.      Marland Kitchen diltiazem (CARDIZEM CD) 120 MG 24 hr capsule       . estradiol (ESTRACE) 1 MG tablet TAKE 2 TABLETS BY MOUTH EVERY OTHER DAY AND 1 TABLET EVERY OTHER DAY  45 tablet  11  . imipramine (TOFRANIL) 25 MG tablet Take 25 mg by mouth 2 (two) times daily.      . metoprolol succinate (TOPROL-XL) 25 MG 24 hr tablet TAKE 1/2 TABLET BY MOUTH ONCE DAILY AS DIRECTED.  15 tablet  5  . mirabegron ER (MYRBETRIQ) 25 MG TB24 Take 25 mg by mouth 2 (two) times daily.      Marland Kitchen topiramate (TOPAMAX) 50 MG  tablet Take 50 mg by mouth 2 (two) times daily.      . valACYclovir (VALTREX) 1000 MG tablet Take 1,000 mg by mouth 4 (four) times daily.       No current facility-administered medications on file prior to visit.    Review of Systems  HENT: Positive for ear pain.   Respiratory: Positive for cough.   Had headache over the weekend.  No dizziness.   Some nasal congestion and drainage.  Sore throat.  Increased cough.  Left ear discomfort.  Previously told had ear infection.  On augmentin.  Does feel some better.  Persistent increased cough.   No chest pain, tightness.  No increased heart rate or palpitations.   No increased sob.  Some wheezing.  No nausea. No vomiting.  No abdominal pain or cramping.  Decreased appetite.  No bowel change, such as diarrhea.  Some chills.  No documented fever.            Objective:   Physical Exam  Filed Vitals:   06/10/13 0756  Pulse: 85  Temp: 98 F (41.62 C)   58 year old female in no acute distress.   HEENT:  Nares- slightly erythematous turbinates.  Oropharynx - without lesions.  Left TM - no erythema.  Minimal fluid posterior TM.  Right TM clear.   NECK:  Supple.  Nontender.   HEART:  Appears to be regular. LUNGS:  No crackles or wheezing audible.  Respirations even and unlabored.            Assessment & Plan:  CARDIOVASCULAR.  Stable.  Sees Dr Lady GaryFath.     HEALTH MAINTENANCE.  Physical 05/03/12.  Is s/p hysterectomy and does not require yearly pap smears.  Mammogram 03/19/11 - rec ultrasound.  Ultrasound left breast (03/20/11) - Birads II.  Had follow up mammogram 03/19/12.  Recommended follow up views.  03/29/12 follow up views recommended a six month follow up mammogram right breast.  Follow up mammogram 09/28/12 - Birads II.  Colonoscopy 11/11 revealed internal hemorrhoids.  Rec follow up colonoscopy in three years.

## 2013-06-12 NOTE — Assessment & Plan Note (Addendum)
Persistent cough and drainage as outlined.  Some improvement on augmentin and extend out past one week.  Continue augmentin.  Saline nasal spray and nasacort as directed.  Mucinex DM/robitussin DM as outlined.  Prednisone taper as directed.  Follow.  Call with update on symptoms.

## 2013-09-06 ENCOUNTER — Other Ambulatory Visit: Payer: Self-pay | Admitting: Internal Medicine

## 2013-09-10 ENCOUNTER — Emergency Department: Payer: Self-pay | Admitting: Emergency Medicine

## 2013-09-10 LAB — COMPREHENSIVE METABOLIC PANEL
ALBUMIN: 3.5 g/dL (ref 3.4–5.0)
Alkaline Phosphatase: 95 U/L
Anion Gap: 6 — ABNORMAL LOW (ref 7–16)
BILIRUBIN TOTAL: 0.5 mg/dL (ref 0.2–1.0)
BUN: 11 mg/dL (ref 7–18)
CALCIUM: 8.8 mg/dL (ref 8.5–10.1)
CO2: 26 mmol/L (ref 21–32)
Chloride: 108 mmol/L — ABNORMAL HIGH (ref 98–107)
Creatinine: 0.91 mg/dL (ref 0.60–1.30)
Glucose: 107 mg/dL — ABNORMAL HIGH (ref 65–99)
OSMOLALITY: 279 (ref 275–301)
POTASSIUM: 3.8 mmol/L (ref 3.5–5.1)
SGOT(AST): 22 U/L (ref 15–37)
SGPT (ALT): 30 U/L (ref 12–78)
SODIUM: 140 mmol/L (ref 136–145)
Total Protein: 6.3 g/dL — ABNORMAL LOW (ref 6.4–8.2)

## 2013-09-10 LAB — URINALYSIS, COMPLETE
BILIRUBIN, UR: NEGATIVE
BLOOD: NEGATIVE
Bacteria: NONE SEEN
Glucose,UR: NEGATIVE mg/dL (ref 0–75)
Ketone: NEGATIVE
Leukocyte Esterase: NEGATIVE
NITRITE: NEGATIVE
PROTEIN: NEGATIVE
Ph: 7 (ref 4.5–8.0)
RBC,UR: NONE SEEN /HPF (ref 0–5)
SQUAMOUS EPITHELIAL: NONE SEEN
Specific Gravity: 1.008 (ref 1.003–1.030)
WBC UR: NONE SEEN /HPF (ref 0–5)

## 2013-09-10 LAB — CBC
HCT: 40.6 % (ref 35.0–47.0)
HGB: 13.3 g/dL (ref 12.0–16.0)
MCH: 29 pg (ref 26.0–34.0)
MCHC: 32.8 g/dL (ref 32.0–36.0)
MCV: 89 fL (ref 80–100)
Platelet: 211 10*3/uL (ref 150–440)
RBC: 4.59 10*6/uL (ref 3.80–5.20)
RDW: 13.6 % (ref 11.5–14.5)
WBC: 9 10*3/uL (ref 3.6–11.0)

## 2013-09-10 LAB — LIPASE, BLOOD: Lipase: 105 U/L (ref 73–393)

## 2013-09-20 ENCOUNTER — Encounter: Payer: Self-pay | Admitting: General Surgery

## 2013-09-20 ENCOUNTER — Observation Stay: Payer: Self-pay | Admitting: Internal Medicine

## 2013-09-20 DIAGNOSIS — K5900 Constipation, unspecified: Secondary | ICD-10-CM

## 2013-09-20 LAB — CBC WITH DIFFERENTIAL/PLATELET
BASOS PCT: 0.3 %
Basophil #: 0 10*3/uL (ref 0.0–0.1)
EOS ABS: 0.3 10*3/uL (ref 0.0–0.7)
Eosinophil %: 2.6 %
HCT: 45.8 % (ref 35.0–47.0)
HGB: 15.2 g/dL (ref 12.0–16.0)
LYMPHS PCT: 11.5 %
Lymphocyte #: 1.1 10*3/uL (ref 1.0–3.6)
MCH: 29 pg (ref 26.0–34.0)
MCHC: 33.3 g/dL (ref 32.0–36.0)
MCV: 87 fL (ref 80–100)
Monocyte #: 0.6 x10 3/mm (ref 0.2–0.9)
Monocyte %: 5.7 %
Neutrophil #: 7.9 10*3/uL — ABNORMAL HIGH (ref 1.4–6.5)
Neutrophil %: 79.9 %
Platelet: 250 10*3/uL (ref 150–440)
RBC: 5.26 10*6/uL — ABNORMAL HIGH (ref 3.80–5.20)
RDW: 13.3 % (ref 11.5–14.5)
WBC: 9.8 10*3/uL (ref 3.6–11.0)

## 2013-09-20 LAB — COMPREHENSIVE METABOLIC PANEL
ALBUMIN: 4 g/dL (ref 3.4–5.0)
ALK PHOS: 109 U/L
AST: 21 U/L (ref 15–37)
Anion Gap: 6 — ABNORMAL LOW (ref 7–16)
BUN: 8 mg/dL (ref 7–18)
Bilirubin,Total: 0.6 mg/dL (ref 0.2–1.0)
CHLORIDE: 106 mmol/L (ref 98–107)
CO2: 26 mmol/L (ref 21–32)
CREATININE: 0.97 mg/dL (ref 0.60–1.30)
Calcium, Total: 9 mg/dL (ref 8.5–10.1)
EGFR (African American): >60
EGFR (Non-African Amer.): >60
Glucose: 117 mg/dL — ABNORMAL HIGH (ref 65–99)
Osmolality: 275 (ref 275–301)
POTASSIUM: 3.5 mmol/L (ref 3.5–5.1)
SGPT (ALT): 30 U/L (ref 12–78)
SODIUM: 138 mmol/L (ref 136–145)
Total Protein: 7.4 g/dL (ref 6.4–8.2)

## 2013-09-20 LAB — URINALYSIS, COMPLETE
BILIRUBIN, UR: NEGATIVE
Bacteria: NONE SEEN
Blood: NEGATIVE
Glucose,UR: NEGATIVE mg/dL (ref 0–75)
LEUKOCYTE ESTERASE: NEGATIVE
NITRITE: NEGATIVE
Ph: 5 (ref 4.5–8.0)
Protein: NEGATIVE
Specific Gravity: 1.023 (ref 1.003–1.030)
Squamous Epithelial: 2

## 2013-09-21 LAB — CBC WITH DIFFERENTIAL/PLATELET
BASOS ABS: 0 10*3/uL (ref 0.0–0.1)
Basophil %: 0.4 %
Eosinophil #: 0.2 10*3/uL (ref 0.0–0.7)
Eosinophil %: 2.6 %
HCT: 37.8 % (ref 35.0–47.0)
HGB: 13 g/dL (ref 12.0–16.0)
Lymphocyte #: 1.3 10*3/uL (ref 1.0–3.6)
Lymphocyte %: 17.8 %
MCH: 29.7 pg (ref 26.0–34.0)
MCHC: 34.4 g/dL (ref 32.0–36.0)
MCV: 86 fL (ref 80–100)
MONO ABS: 0.5 x10 3/mm (ref 0.2–0.9)
MONOS PCT: 6.6 %
NEUTROS PCT: 72.6 %
Neutrophil #: 5.3 10*3/uL (ref 1.4–6.5)
PLATELETS: 211 10*3/uL (ref 150–440)
RBC: 4.37 10*6/uL (ref 3.80–5.20)
RDW: 13.4 % (ref 11.5–14.5)
WBC: 7.3 10*3/uL (ref 3.6–11.0)

## 2013-09-21 LAB — BASIC METABOLIC PANEL
Anion Gap: 11 (ref 7–16)
BUN: 4 mg/dL — ABNORMAL LOW (ref 7–18)
Calcium, Total: 8.2 mg/dL — ABNORMAL LOW (ref 8.5–10.1)
Chloride: 110 mmol/L — ABNORMAL HIGH (ref 98–107)
Co2: 21 mmol/L (ref 21–32)
Creatinine: 0.82 mg/dL (ref 0.60–1.30)
GLUCOSE: 100 mg/dL — AB (ref 65–99)
OSMOLALITY: 280 (ref 275–301)
POTASSIUM: 3.4 mmol/L — AB (ref 3.5–5.1)
Sodium: 142 mmol/L (ref 136–145)

## 2013-09-21 LAB — MAGNESIUM: Magnesium: 2 mg/dL

## 2013-12-13 LAB — HM COLONOSCOPY

## 2014-02-28 DIAGNOSIS — N200 Calculus of kidney: Secondary | ICD-10-CM | POA: Insufficient documentation

## 2014-04-10 ENCOUNTER — Other Ambulatory Visit: Payer: Self-pay | Admitting: Internal Medicine

## 2014-04-10 NOTE — Telephone Encounter (Signed)
Last OV 3.20.15, last refill 12.21.15.  Please advise refill

## 2014-04-11 NOTE — Telephone Encounter (Signed)
ok'd #[phone] with one refill.

## 2014-05-01 ENCOUNTER — Telehealth: Payer: Self-pay | Admitting: Internal Medicine

## 2014-05-01 NOTE — Telephone Encounter (Signed)
left message to call office to reschedule appt 2/8.msn ° °

## 2014-05-02 ENCOUNTER — Encounter: Payer: Self-pay | Admitting: Internal Medicine

## 2014-05-02 ENCOUNTER — Ambulatory Visit: Payer: Self-pay | Admitting: Internal Medicine

## 2014-05-02 ENCOUNTER — Ambulatory Visit (INDEPENDENT_AMBULATORY_CARE_PROVIDER_SITE_OTHER): Payer: BLUE CROSS/BLUE SHIELD | Admitting: Internal Medicine

## 2014-05-02 VITALS — BP 118/70 | HR 79 | Temp 98.0°F | Ht 61.5 in | Wt 164.0 lb

## 2014-05-02 DIAGNOSIS — E7800 Pure hypercholesterolemia, unspecified: Secondary | ICD-10-CM

## 2014-05-02 DIAGNOSIS — Z13220 Encounter for screening for lipoid disorders: Secondary | ICD-10-CM

## 2014-05-02 DIAGNOSIS — Z1239 Encounter for other screening for malignant neoplasm of breast: Secondary | ICD-10-CM

## 2014-05-02 DIAGNOSIS — N3010 Interstitial cystitis (chronic) without hematuria: Secondary | ICD-10-CM

## 2014-05-02 DIAGNOSIS — R5383 Other fatigue: Secondary | ICD-10-CM

## 2014-05-02 DIAGNOSIS — K219 Gastro-esophageal reflux disease without esophagitis: Secondary | ICD-10-CM

## 2014-05-02 DIAGNOSIS — Z0000 Encounter for general adult medical examination without abnormal findings: Secondary | ICD-10-CM

## 2014-05-02 MED ORDER — METOPROLOL SUCCINATE ER 25 MG PO TB24: ORAL_TABLET | ORAL | Status: DC

## 2014-05-02 MED ORDER — ESTRADIOL 1 MG PO TABS: ORAL_TABLET | ORAL | Status: DC

## 2014-05-02 NOTE — Progress Notes (Signed)
Patient ID: Savannah Gomez, female   DOB: 1955/05/31, 59 y.o.   MRN: [id number]   Subjective:    Patient ID: Savannah Gomez, female    DOB: [birth year], 59 y.o.   MRN: [id number]  HPI  Patient here for her physical exam.  She reports she is doing well.  Working.  Some increased stress at work, but she feels she is handling things relatively well.  Reflux controlled.  Eating and drinking well.  Sees Dr Tiffany Kocher.  Bowels stable.  Taking a stool softener.     Past Medical History  Diagnosis Date  . Asthma   . Arthritis   . GERD (gastroesophageal reflux disease)   . Allergy   . Chicken pox   . Migraine headache   . Urine incontinence     Current Outpatient Prescriptions on File Prior to Visit  Medication Sig Dispense Refill  . acetaminophen (TYLENOL) 500 MG tablet Take 500 mg by mouth every 6 (six) hours as needed for pain.    . calcium carbonate (CALCIUM 600) 600 MG TABS tablet Take 600 mg by mouth daily with breakfast.    . cetirizine (ZYRTEC) 10 MG tablet Take 10 mg by mouth daily.    Marland Kitchen dexlansoprazole (DEXILANT) 60 MG capsule Take 60 mg by mouth daily.    Marland Kitchen diltiazem (CARDIZEM CD) 120 MG 24 hr capsule     . imipramine (TOFRANIL) 25 MG tablet Take 25 mg by mouth 2 (two) times daily.    . mirabegron ER (MYRBETRIQ) 25 MG TB24 Take 25 mg by mouth 2 (two) times daily.    Marland Kitchen topiramate (TOPAMAX) 50 MG tablet Take 50 mg by mouth 2 (two) times daily.     No current facility-administered medications on file prior to visit.    Review of Systems  Constitutional: Positive for fatigue (some fatigue, but overall doing well.  ). Negative for unexpected weight change.  HENT: Negative for congestion, sinus pressure and sore throat.   Eyes: Negative for pain and visual disturbance.  Respiratory: Negative for cough, chest tightness and shortness of breath.   Cardiovascular: Negative for chest pain, palpitations and leg swelling.  Gastrointestinal: Negative for abdominal pain and diarrhea.    Bowels doing ok.  Taking a stool softener.    Genitourinary: Negative for frequency and difficulty urinating.  Musculoskeletal: Negative for back pain and joint swelling.  Skin: Negative for color change and rash.  Neurological: Negative for dizziness and headaches.  Hematological: Negative for adenopathy. Does not bruise/bleed easily.  Psychiatric/Behavioral: Negative for dysphoric mood and decreased concentration.       Objective:    Physical Exam  Constitutional: She is oriented to person, place, and time. She appears well-developed and well-nourished.  HENT:  Nose: Nose normal.  Mouth/Throat: Oropharynx is clear and moist.  Eyes: Right eye exhibits no discharge. Left eye exhibits no discharge. No scleral icterus.  Neck: Neck supple. No thyromegaly present.  Cardiovascular: Normal rate and regular rhythm.   Pulmonary/Chest: Breath sounds normal. No accessory muscle usage. No tachypnea. No respiratory distress. She has no decreased breath sounds. She has no wheezes. She has no rhonchi. Right breast exhibits no inverted nipple, no mass, no nipple discharge and no tenderness (no axillary adenopathy). Left breast exhibits no inverted nipple, no mass, no nipple discharge and no tenderness (no axilarry adenopathy).  Abdominal: Soft. Bowel sounds are normal. There is no tenderness.  Musculoskeletal: She exhibits no edema or tenderness.  Lymphadenopathy:    She has no cervical adenopathy.  Neurological: She is alert and oriented to person, place, and time.  Skin: Skin is warm. No rash noted.  Psychiatric: She has a normal mood and affect. Her behavior is normal.    BP 118/70 mmHg  Pulse 79  Temp(Src) 98 F (36.7 C) (Oral)  Ht 5' 1.5" (1.562 m)  Wt 164 lb (74.39 kg)  BMI 30.49 kg/m2  SpO2 98% Wt Readings from Last 3 Encounters:  05/02/14 164 lb (74.39 kg)  06/10/13 165 lb 4 oz (74.957 kg)  06/07/13 167 lb (75.751 kg)     Lab Results  Component Value Date   WBC 5.2 02/23/2013    HGB 13.6 02/23/2013   HCT 40.5 02/23/2013   PLT 227.0 02/23/2013   GLUCOSE 97 02/23/2013   CHOL 179 05/21/2012   TRIG 44.0 05/21/2012   HDL 75.40 05/21/2012   LDLCALC 95 05/21/2012   ALT 24 02/23/2013   AST 20 02/23/2013   NA 139 02/23/2013   K 4.1 02/23/2013   CL 107 02/23/2013   CREATININE 1.1 02/23/2013   BUN 13 02/23/2013   CO2 25 02/23/2013   TSH 2.22 02/23/2013       Assessment & Plan:   Problem List Items Addressed This Visit    Fatigue    Some fatigue.  Overall doing well.  Check cbc, met c and tsh.        Relevant Orders   TSH   GERD (gastroesophageal reflux disease)    Controlled on dexilant.  Sees Dr Tiffany Kocher.        Relevant Medications   Docusate Calcium (STOOL SOFTENER PO)   Other Relevant Orders   CBC with Differential/Platelet   Health care maintenance    Physical today.  Schedule mammogram.  Up to date with colonoscopy.       Hypercholesteremia    Low cholesterol diet and exercise.  Check lipid panel        Relevant Medications   metoprolol succinate (TOPROL-XL) 24 hr tablet   Other Relevant Orders   Lipid panel   Interstitial cystitis    Currently doing well.  On imipramine.  Sees Dr Jacqlyn Larsen.        Relevant Orders   Comprehensive metabolic panel    Other Visit Diagnoses    Breast cancer screening    -  Primary    Relevant Orders    MM DIGITAL SCREENING BILATERAL    Screening cholesterol level          I spent 25 minutes with the patient and more than 50% of the time was spent in consultation regarding the above.     Savannah Pheasant, MD

## 2014-05-02 NOTE — Progress Notes (Signed)
Pre visit review using our clinic review tool, if applicable. No additional management support is needed unless otherwise documented below in the visit note. 

## 2014-05-04 ENCOUNTER — Encounter: Payer: BC Managed Care – PPO | Admitting: Internal Medicine

## 2014-05-07 ENCOUNTER — Encounter: Payer: Self-pay | Admitting: Internal Medicine

## 2014-05-07 DIAGNOSIS — Z0000 Encounter for general adult medical examination without abnormal findings: Secondary | ICD-10-CM | POA: Insufficient documentation

## 2014-05-07 NOTE — Assessment & Plan Note (Signed)
Low cholesterol diet and exercise.  Check lipid panel.   

## 2014-05-07 NOTE — Assessment & Plan Note (Signed)
Physical today.  Schedule mammogram.  Up to date with colonoscopy.

## 2014-05-07 NOTE — Assessment & Plan Note (Signed)
Currently doing well.  On imipramine.  Sees Dr Achilles Dunkope.

## 2014-05-07 NOTE — Assessment & Plan Note (Signed)
Some fatigue.  Overall doing well.  Check cbc, met c and tsh.

## 2014-05-07 NOTE — Assessment & Plan Note (Signed)
Controlled on dexilant.  Sees Dr Markham JordanElliot.

## 2014-05-12 ENCOUNTER — Other Ambulatory Visit: Payer: BLUE CROSS/BLUE SHIELD

## 2014-05-19 ENCOUNTER — Other Ambulatory Visit: Payer: BLUE CROSS/BLUE SHIELD

## 2014-05-19 ENCOUNTER — Ambulatory Visit: Payer: Self-pay | Admitting: Internal Medicine

## 2014-05-19 LAB — HM MAMMOGRAPHY

## 2014-05-26 ENCOUNTER — Other Ambulatory Visit (INDEPENDENT_AMBULATORY_CARE_PROVIDER_SITE_OTHER): Payer: BLUE CROSS/BLUE SHIELD

## 2014-05-26 DIAGNOSIS — K219 Gastro-esophageal reflux disease without esophagitis: Secondary | ICD-10-CM

## 2014-05-26 DIAGNOSIS — N3010 Interstitial cystitis (chronic) without hematuria: Secondary | ICD-10-CM

## 2014-05-26 DIAGNOSIS — E7800 Pure hypercholesterolemia, unspecified: Secondary | ICD-10-CM

## 2014-05-26 DIAGNOSIS — R5383 Other fatigue: Secondary | ICD-10-CM

## 2014-05-26 LAB — CBC WITH DIFFERENTIAL/PLATELET
BASOS PCT: 0.7 % (ref 0.0–3.0)
Basophils Absolute: 0.1 10*3/uL (ref 0.0–0.1)
Eosinophils Absolute: 0.5 10*3/uL (ref 0.0–0.7)
Eosinophils Relative: 3.8 % (ref 0.0–5.0)
HCT: 43.6 % (ref 36.0–46.0)
Hemoglobin: 14.9 g/dL (ref 12.0–15.0)
LYMPHS PCT: 24.3 % (ref 12.0–46.0)
Lymphs Abs: 2.9 10*3/uL (ref 0.7–4.0)
MCHC: 34.1 g/dL (ref 30.0–36.0)
MCV: 86.2 fl (ref 78.0–100.0)
Monocytes Absolute: 1.1 10*3/uL — ABNORMAL HIGH (ref 0.1–1.0)
Monocytes Relative: 9.1 % (ref 3.0–12.0)
Neutro Abs: 7.4 10*3/uL (ref 1.4–7.7)
Neutrophils Relative %: 62.1 % (ref 43.0–77.0)
Platelets: 391 10*3/uL (ref 150.0–400.0)
RBC: 5.06 Mil/uL (ref 3.87–5.11)
RDW: 13.3 % (ref 11.5–15.5)
WBC: 11.9 10*3/uL — ABNORMAL HIGH (ref 4.0–10.5)

## 2014-05-26 LAB — COMPREHENSIVE METABOLIC PANEL
ALBUMIN: 4.3 g/dL (ref 3.5–5.2)
ALT: 17 U/L (ref 0–35)
AST: 18 U/L (ref 0–37)
Alkaline Phosphatase: 100 U/L (ref 39–117)
BUN: 17 mg/dL (ref 6–23)
CALCIUM: 9.2 mg/dL (ref 8.4–10.5)
CO2: 26 mEq/L (ref 19–32)
Chloride: 106 mEq/L (ref 96–112)
Creatinine, Ser: 1.03 mg/dL (ref 0.40–1.20)
GFR: 58.12 mL/min — AB (ref >60.00)
Glucose, Bld: 92 mg/dL (ref 70–99)
POTASSIUM: 4 meq/L (ref 3.5–5.1)
Sodium: 138 mEq/L (ref 135–145)
Total Bilirubin: 0.6 mg/dL (ref 0.2–1.2)
Total Protein: 6.7 g/dL (ref 6.0–8.3)

## 2014-05-26 LAB — LIPID PANEL
CHOLESTEROL: 191 mg/dL (ref 0–200)
HDL: 86.6 mg/dL (ref >39.00)
LDL CALC: 95 mg/dL (ref 0–99)
NonHDL: 104.4
TRIGLYCERIDES: 49 mg/dL (ref 0.0–149.0)
Total CHOL/HDL Ratio: 2
VLDL: 9.8 mg/dL (ref 0.0–40.0)

## 2014-05-26 LAB — TSH: TSH: 2.52 u[IU]/mL (ref 0.35–4.50)

## 2014-05-28 ENCOUNTER — Encounter: Payer: Self-pay | Admitting: Internal Medicine

## 2014-05-28 ENCOUNTER — Telehealth: Payer: Self-pay | Admitting: Internal Medicine

## 2014-05-28 DIAGNOSIS — D72829 Elevated white blood cell count, unspecified: Secondary | ICD-10-CM

## 2014-05-28 NOTE — Telephone Encounter (Signed)
Pt was notified of labs via my chart.  Needs a f/u non fasting lab in 1-2 weeks.  Please contact the patient with an appt date and time.  Thanks

## 2014-06-12 ENCOUNTER — Other Ambulatory Visit: Payer: BLUE CROSS/BLUE SHIELD

## 2014-06-22 ENCOUNTER — Other Ambulatory Visit (INDEPENDENT_AMBULATORY_CARE_PROVIDER_SITE_OTHER): Payer: BLUE CROSS/BLUE SHIELD

## 2014-06-22 DIAGNOSIS — D72829 Elevated white blood cell count, unspecified: Secondary | ICD-10-CM | POA: Diagnosis not present

## 2014-06-22 NOTE — Addendum Note (Signed)
Addended by: Cristela BluePULLIAM, Kush Farabee W on: 06/22/2014 04:03 PM   Modules accepted: Orders

## 2014-06-23 LAB — CBC WITH DIFFERENTIAL/PLATELET
BASOS ABS: 0 10*3/uL (ref 0.0–0.1)
BASOS PCT: 0.5 % (ref 0.0–3.0)
Eosinophils Absolute: 0.5 10*3/uL (ref 0.0–0.7)
Eosinophils Relative: 9 % — ABNORMAL HIGH (ref 0.0–5.0)
HCT: 38.1 % (ref 36.0–46.0)
HEMOGLOBIN: 13 g/dL (ref 12.0–15.0)
LYMPHS PCT: 35.8 % (ref 12.0–46.0)
Lymphs Abs: 1.9 10*3/uL (ref 0.7–4.0)
MCHC: 34 g/dL (ref 30.0–36.0)
MCV: 86.6 fl (ref 78.0–100.0)
MONOS PCT: 4.7 % (ref 3.0–12.0)
Monocytes Absolute: 0.2 10*3/uL (ref 0.1–1.0)
Neutro Abs: 2.6 10*3/uL (ref 1.4–7.7)
Neutrophils Relative %: 50 % (ref 43.0–77.0)
Platelets: 215 10*3/uL (ref 150.0–400.0)
RBC: 4.4 Mil/uL (ref 3.87–5.11)
RDW: 14.2 % (ref 11.5–15.5)
WBC: 5.2 10*3/uL (ref 4.0–10.5)

## 2014-06-24 ENCOUNTER — Encounter: Payer: Self-pay | Admitting: Internal Medicine

## 2014-07-15 NOTE — Consult Note (Signed)
PATIENT NAME:  Savannah Gomez, Savannah Gomez MR#:  [id number] DATE OF BIRTH:  October 03, 1955  DATE OF CONSULTATION:  09/20/2013  REFERRING PHYSICIAN:  Lynnae Prude, MD CONSULTING PHYSICIAN:  Earline Mayotte, MD  PRIMARY CARE PHYSICIAN: Dale [place], MD  ATTENDING PHYSICIAN: Huey Bienenstock, MD  INDICATION FOR CONSULTATION: Abdominal pain, possible diverticulitis, possible obstruction.   CLINICAL NOTE: This 59 year old woman was in her usual state of health until June 18th when she noticed some left lower quadrant pain. This accelerated over the next 48 hours prompting a trip to the Emergency Room on June 20th. At that time, a CT scan was obtained which suggested thickening of the distal descending colon adjacent to diverticula. No evidence of obstruction or evidence of perforation. Mild inflammation was thought to represent early diverticulitis. Incidental finding was nonobstructing left nephrolithiasis. The patient's laboratory studies at that time were normal and she was placed on Cipro and Flagyl. The latter produced significant nausea and vomiting, even on a reduced dose. This was changed to Augmentin on June 25 with a transient improvement in her symptoms. She developed worsening pain last night and presented to the Emergency Room in the early morning hours today and was admitted for evaluation.   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 The patient has been able to maintain a regular diet throughout this episode. She denied previous episodes of similar abdominal pain.   There is a past history of significant slow colonic transit with her normal bowel movements occurring once every week. She has not reported any blood or mucus. Last colonoscopy was completed in 2011 and was notable only for diverticula.   FAMILY HISTORY: Colon cancer in both mother and a sister.    PAST MEDICAL HISTORY: Notable for asthma, arthritis, reflux, and intermittent palpitations. The latter is managed with Cardizem and metoprolol.   ALLERGIES: Only recorded drug allergy is Demerol.   PHYSICAL EXAMINATION:  GENERAL: Today shows the patient to be awake, alert and orientated.  VITAL SIGNS: She is afebrile and has been since admission. Normal vital signs.  HEAD AND NECK: Unremarkable.  CHEST: Clear to auscultation.  CARDIAC: Regular rhythm without murmur, rub or gallop.  ABDOMEN: Nondistended. Bowel sounds were normoactive. There was mild tenderness with firm palpation in the left lower quadrant. No peritoneal irritation, guarding or referred pain.  LYMPH: No inguinal adenopathy.  EXTREMITIES: No peripheral edema.   DIAGNOSTIC DATA: CT of June 20th and plain films of earlier this morning were reviewed. The latter study shows CT contrast within the colon without dilatation. No rectal contrast. (The patient has had multiple enemas in the last 24 hours).   Laboratory studies completed earlier this morning showed a white blood cell count of 9,800 with 80% polys and 12% lymphocytes. Hemoglobin 15.2, up from 13.3 on June 20th. Normal liver function studies. Normal electrolytes. Minimally elevated blood sugar at 117. Urinalysis showed elevated specific gravity of 1.23, otherwise negative.   The CT findings of diverticulitis are modest at best. She has not shown any laboratory evidence of an acute inflammatory process, although she does report being chilled. She has had no definite improvement on Cipro/Flagyl, Augmentin or presently on Levaquin.   Last operative intervention was in 2000 when she had her ovaries removed (previously status post vaginal hysterectomy). Adhesions would be unlikely. It may be a combination of general slow colonic transit, long-standing calcium  channel blockers as well as this recent pain episode that has exacerbated her obstipation.   Arrangements were made  for a Gastrografin enema to evaluate for possibility of obstruction near the area of reported diverticulitis. These films were reviewed by phone with David SwazilandJordan MD from radiology. There was no obstruction to contrast passage, although multiple large stool balls were identified. Normal mucosal pattern.   IMPRESSION: Obstipation secondary to multifactorial causes.   RECOMMENDATIONS: Efforts at lower gastrointestinal cleanout should be continued and at present consideration could be given towards discontinuation of antibiotic therapy and possibly calcium channel blockers.   Thank you for the opportunity of seeing this patient.  ____________________________ Earline MayotteJeffrey W. Byrnett, MD jwb:sb D: 09/20/2013 16:33:53 ET T: 09/20/2013 17:05:53 ET JOB#: [id number]  cc: Earline MayotteJeffrey W. Byrnett, MD, <Dictator> Dale Durhamharlene Scott, MD Scot Junobert T. Elliott, MD JEFFREY Brion AlimentW BYRNETT MD ELECTRONICALLY SIGNED 09/20/2013 18:49

## 2014-07-15 NOTE — Consult Note (Signed)
PATIENT NAME:  Savannah Gomez, Savannah Gomez MR#:  [id number] DATE OF BIRTH:  03-Mar-1956  DATE OF CONSULTATION:  09/20/2013.  REFERRING PHYSICIAN:  Dr. Adrian SaranSital Gomez.  CONSULTING PHYSICIAN:  Dow AdolphMatthew Rein, MD  REASON FOR CONSULTATION:  Abdominal pain, diverticulitis.   HISTORY OF PRESENT ILLNESS:  Savannah Gomez is 59 year old female with a history of GERD, headaches, who is presenting to the Emergency Room for evaluation of left lower quadrant pain, nausea and vomiting.  Of note, Savannah Gomez did develop abdominal pain back in the middle of June.  She was seen in the Emergency Room for this and had evidence of mild diverticulitis in her descending colon.  She was discharged on Cipro and Flagyl but developed nausea on the Flagyl.  She was switched from Flagyl to Augmentin by Dr. Mechele CollinElliott.    Unfortunately, the patient did not tolerate the Augmentin well, either.  She has continued to have nausea and also now also has worsening left lower quadrant pain.  She presented to the Emergency Room for further evaluation of this.    In the Emergency Room Savannah Gomez underwent laboratory testing which was unremarkable. She also underwent a KUB which did show significant stool burden along with contrast from her prior CT in her distal colon.    Currently she is doing a little bit better now that she is receiving pain medicine. She did receive an enema in the Emergency Room but had very little output from that enema.   PAST MEDICAL HISTORY:    1.  GERD.  2.  Migraine.   PAST SURGICAL HISTORY: 1.  D and C.  2.  Vaginal hysterectomy.  3.  Bilateral oophorectomy.   SOCIAL HISTORY:  No alcohol, no recreational drugs.   FAMILY HISTORY:  The patient does have a significant family history of colon cancer including in her mother and her sister.    HOME MEDICATIONS:  1.  Percocet 1 tablet every 6 hours p.r.n.  2.  Diltiazem 120 mg daily.  3.  Topiramate 50 mg b.i.d. 4.  Imipramine 25 mg at bedtime. 5.  Zyrtec 10 mg daily.   6.  Metoprolol 25 mg daily.  7.  Magnesium citrate 1 tablet daily.  8.  Augmentin 1 tablet q.12 hours. 9.  Dexilant 60 mg daily.  10. Estradiol 1 mg 2 tablets daily.  11. Myrbetriq 50 mg daily.  12. Calcium plus vitamin D.    REVIEW OF SYSTEMS:  A 10-point review of systems was conducted and is negative except as stated in the HPI.   PHYSICAL EXAMINATION:  VITAL SIGNS:  Temperature 97.7, pulse 74, respirations 18, blood pressure 106.68, pulse oximetry is 100% on room air.  GENERAL: Alert and oriented times 4.  No acute distress. Appears stated age. HEENT: Normocephalic/atraumatic. Extraocular movements are intact. Anicteric. NECK: Soft, supple. JVP appears normal. No adenopathy. CHEST: Clear to auscultation. No wheeze or crackle. Respirations unlabored. HEART: Regular. No murmur, rub, or gallop.  Normal S1 and S2. ABDOMEN: Soft, nondistended.  Some mild left lower quadrant tenderness to palpation. Decreased bowel sounds diffusely. No organomegaly. No masses EXTREMITIES: No swelling, well perfused. SKIN: No rash or lesion. Skin color, texture, turgor normal. NEUROLOGICAL: Grossly intact. PSYCHIATRIC: Normal tone and affect. MUSCULOSKELETAL: No joint swelling or erythema.   LABORATORY DATA:  Sodium 138, creatinine 0.97, potassium 3.5, chloride 106, her liver enzymes are normal, white count 9.8, hemoglobin 15.2, hematocrit 46, platelets 250,000.   Urinalysis is unremarkable.   IMAGING:  KUB showed a moderate amount of  retained large bowel stool and enteric CT contrast in her small bowel.   ASSESSMENT AND PLAN:  Left lower quadrant abdominal pain, nausea.  I suspect this is likely a combination of constipation along with possibly some residual diverticulitis. She has not been moving her bowels well at all in the past week or so.  This is also evidenced on the KUB.    RECOMMENDATIONS:  We will go ahead and start her on a bowel regimen and a molasses enema from below and we will also try  to give her MiraLax in a form that she can tolerate better, such as MiraLax in lemonade as opposed to Gatorade.    In terms of her possible diverticulitis I think levofloxacin is an okay choice, although the anaerobic coverage is not great, however, she did not tolerate Flagyl or Augmentin to this point.  I also do suspect that more of the symptoms are from constipation as opposed to diverticulitis.   If the patient does not respond well to the above measures or any other concerns we will go ahead with a CT scan to evaluate for evidence of worsening diverticulitis or complications from the diverticulitis.   Thank you for this consult.    ____________________________ Dow Adolph, MD mr:lt D: 09/20/2013 12:35:38 ET T: 09/20/2013 13:55:48 ET JOB#: [id number]  cc: Dow Adolph, MD, <Dictator> Kathalene Frames MD ELECTRONICALLY SIGNED 10/13/2013 10:20

## 2014-07-15 NOTE — Consult Note (Signed)
Pt had gastrograffin enema and showed constipation without colonic obstruction or stricture.  Continue lactulose and see no reason could not start regular diet since no sign of any colonic problem that would need colonoscopy.  Electronic Signatures: Scot JunElliott, Robert T (MD)  (Signed on 30-Jun-15 17:19)  Authored  Last Updated: 30-Jun-15 17:19 by Scot JunElliott, Robert T (MD)

## 2014-07-15 NOTE — H&P (Signed)
PATIENT NAME:  Savannah Gomez, Savannah Gomez MR#:  [id number] DATE OF BIRTH:  1955/05/06  DATE OF ADMISSION:  09/20/2013.  PRIMARY CARE PHYSICIAN:  Dr. Dale Holstein.   REFERRING PHYSICIAN: [place] Sink. Dolores Frame, MD.  CHIEF COMPLAINT: Abdominal pain, nausea.   HISTORY OF PRESENT ILLNESS: This is a 59 year old female with known history of migraine and GERD. The patient presents with complaints of abdominal pain and nausea. The patient was seen in the Emergency Department on that June 20 where she was diagnosed with diverticulitis with CAT scan.  She was afebrile, had no leukocytosis.  She was sent home on p.o. Cipro and Flagyl. The patient has nausea to Flagyl and was switched to Augmentin by her PCP.  The patient saw Dr. Mechele Collin yesterday because she was still having abdominal pain. He did an x-ray and told her she has constipation and gave her MiraLax. The patient at home could not take MiraLax because of her nausea, so the patient called Dr. Shelle Iron, on call, who instructed her to come to the Emergency Department for admission. The patient she was afebrile, did not have any leukocytosis in the Emergency Department.    An abdominal x-ray was done which did not show any acute cardiopulmonary process and a moderate amount of retained large bowel stool and enteric contrast without obstructive gas pattern. Hospitalists were requested to admit the patient for further treatment of her diverticulitis and abdominal pain.    PAST MEDICAL HISTORY:  Esophageal reflux, trigger finger, headache, and migraine.   SURGICAL HISTORY:  1.  Status post D and C.  2.  Vaginal hysterectomy secondary to fibroids.  3.  Sinus surgery.  4.  Rotator cuff surgery.  5.  Broken finger with pins placed.  6.  Trigger finger surgery.  7.  Bilateral carpal tunnel syndrome.  8.  Right elbow surgery.  9.  Bilateral oophorectomy.  10. Rotator cuff repair June 2002.   SOCIAL HISTORY: The patient is married, has 2 children. No tobacco. No alcohol.  No illicit drug use.   FAMILY HISTORY: Significant for colon cancer in her mother. Father has thyroid cancer.  One sister also has colon cancer.   HOME MEDICATIONS:  1.  Percocet 1 tablet every 6 hours as needed.  2.  Diltiazem 120 mg oral daily.  3.  Topiramate 50 mg oral 2 times a day (Topamax).  4.  Imipramine 25 mg oral 2 tablets at bedtime.  5.  Zyrtec 10 mg oral daily.  6.  Metoprolol succinate 25 mg 0.5 tablet oral daily.  7.  Magnesium carbonate 1 tablet daily.  8.  Augmentin 1 tablet every 12 hours.  9.  Dexilant 60 mg oral daily.  10. Estradiol 1 mg oral 2 tablets every other day.  11. Myrbetriq 50 mg oral daily.  12. Calcium 600 with vitamin D 1 tablet oral daily.   REVIEW OF SYSTEMS:  CONSTITUTIONAL: The patient denies fever, chills, fatigue.  EYES: Denies blurry vision, double vision, inflammation.  ENT: Denies tinnitus, ear pain, pain, hearing loss.  RESPIRATORY: Denies cough, wheezing, hemoptysis or chronic obstructive pulmonary disease.  CARDIOVASCULAR: Denies chest pain, edema, palpitation, syncope.  GASTROINTESTINAL: Reports nausea, abdominal pain, and constipation. Denies hematemesis, melena, coffee-ground emesis.  GENITOURINARY: Denies dysuria, hematuria, or renal colic.  ENDOCRINE: Denies polyuria, polydipsia, heat or cold intolerance.  HEMATOLOGY: Denies anemia, easy bruising, bleeding, diathesis.  INTEGUMENT: Denies acne, rash, or skin lesion.  MUSCULOSKELETAL: Denies any arthritis, cramps or gout.  NEUROLOGIC: Denies any CVA, TIA,  tremors, vertigo,  ataxia.   PSYCHIATRIC: Denies anxiety, insomnia, or depression.   PHYSICAL EXAMINATION:  VITAL SIGNS: Temperature 97.5, pulse 83, respiratory rate 16, blood pressure 109/63, saturating 100% on room air.  GENERAL: Well-nourished female who looks comfortable in bed, in no apparent distress.  HEENT:  Head atraumatic, normocephalic. Pupils equal, reactive to light. Pink conjunctivae. Anicteric sclerae. Moist oral  mucosa.  NECK: Supple. No thyromegaly. No JVD.  CHEST: Good air entry bilaterally. No wheezing, rales or rhonchi.  CARDIOVASCULAR: S1, S2 heard. No rubs, murmurs or gallops.  ABDOMEN:  Soft, no rebound, no guarding. Bowel sounds present in all 4 quadrants. Has mild tenderness to palpation in the left lower quadrant and periumbilical area.  EXTREMITIES: No edema. No clubbing. No cyanosis.  PSYCHIATRIC: Appropriate affect. Awake, alert x3. Intact judgment and insight.  NEUROLOGIC: Cranial nerves grossly intact. Motor 5/5. No focal deficits. Pedal and radial pulses felt bilaterally.  SKIN: Normal skin turgor. Warm and dry.  MUSCULOSKELETAL: No joint effusion or erythema.   PERTINENT LABORATORY DATA:  Glucose 117, BUN 8, creatinine 0.97, sodium 138, potassium 3.5, chloride 106, CO2 26 white blood cells 9.8, hemoglobin 15.2, hematocrit 45.8, platelets 250,000.   Urinalysis negative for leukocyte esterase and nitrite.   IMAGING:  KUB showing no acute cardiopulmonary process and a moderate amount of retained large bowel stool and enteric contrast without obstructive bowel gas pattern.   ASSESSMENT AND PLAN:  1.  Abdominal pain and nausea. This is most likely related to constipation versus diverticulitis. The patient will be started on IV levofloxacin. Will be started good laxative and good bowel regimen. We will consult Dr. Shelle Ironein.  2.  History of migraine. The patient reports she is on diltiazem for migraine. Will continue her on diltiazem and the rest of her home medication including Topamax and imipramine.  3.  History of gastroesophageal reflux disease. We will continue the patient on PPI.  4.  Deep vein thrombosis prophylaxis. Subcutaneous heparin.   CODE STATUS: Full code.   TOTAL TIME SPENT:  Admission and patient care: 45 minutes.    ____________________________ Starleen Armsawood S. Elgergawy, MD dse:lt D: 09/20/2013 07:40:56 ET T: 09/20/2013 08:47:29 ET JOB#: [id number]  cc: Starleen Armsawood S. Elgergawy,  MD, <Dictator> DAWOOD Teena IraniS ELGERGAWY MD ELECTRONICALLY SIGNED 09/30/2013 0:48

## 2014-07-15 NOTE — Discharge Summary (Signed)
PATIENT NAME:  Savannah NeighborsBENNETT, Arryana H MR#:  [id number] DATE OF BIRTH:  24-Jul-1955  DATE OF ADMISSION:  09/20/2013 DATE OF DISCHARGE:  09/21/2013  ADMISSION DIAGNOSES: 1.  Abdominal pain.  2.  Constipation.   DISCHARGE DIAGNOSES: 1.  Left lower quadrant abdominal pain secondary to obstipation. 2.  Constipation.  CONSULTATIONS:  1.  Dr. Lemar LivingsByrnett.  2.  Dr. Mechele CollinElliott.   LABORATORIES AT DISCHARGE: White blood cells 7.3, hemoglobin 13, hematocrit 38, platelets 211,000, sodium 142, potassium 3.4, chloride 110, bicarbonate 21, BUN 4, creatinine 0.82, glucose is 100.   Barium enema of colon showed no fixed obstruction, large amount of retained stool within the colon from ascending and descending colon.   HOSPITAL COURSE: A 59 year old female, recently diagnosed with diverticulitis, treated as an outpatient and sent from GI's office due to ongoing left lower quadrant pain. For further details, please see H and P.  1.  Left  lower quadrant abdominal pain. It is likely this is secondary to constipation. She had a barium of colon, which showed no evidence of obstruction, but did show a large amount of stool. Dr. Lemar LivingsByrnett felt that this is probably due to her calcium channel blockers. Her abdominal pain has resolved as she has had bowel movements during the hospitalization.   2.  Constipation. The patient will need p.r.n. stool softeners.    3.  Irregular heart beat and hypertension. The patient is on diltiazem and metoprolol.  If she continues to have ongoing issues with constipation, her diltiazem will need to be discontinued, but this should be discussed with her primary care physician.   4.  History of migraines, which is stable during this hospitalization.   DISCHARGE MEDICATIONS:  1.  Zyrtec 10 mg daily.  2.  Acetaminophen/oxycodone 5/325 q. 6 hours p.r.n.   3.  Dexilant 60 mg daily. 4.  Diltiazem 120 mg daily.  5.  Estradiol 2 mg every other day, alternating with 1 tablet every other day.  6.   Imipramine 25 mg 2 tablets at bedtime.  7.  Magnesium carbonate 1 tablet daily.  8.  Topiramate 50 mg b.i.d.   9.  Metoprolol 12.5 daily.  10.  Myrbetriq 50 mg daily.  11.  Calcium plus D 1 tablet daily.  12.  Docusate 100 mg t.i.d.   DISCHARGE DIET: Low-sodium high-fiber diet.   DISCHARGE ACTIVITY: As tolerated.   DISCHARGE FOLLOW UP:  Patient will follow up in one week with Dr. Mechele CollinElliott  and in 1 week with Dr. Dale Durhamharlene Scott.  She sees Dr. Lorin PicketScott and Dr. Mechele CollinElliott.  TIME SPENT: 35 minutes. The patient was stable for discharge     ____________________________ Edwena Mayorga P. Juliene PinaMody, MD spm:ts D: 09/21/2013 13:03:08 ET T: 09/21/2013 14:07:42 ET JOB#: [id number]  cc: Milianna Ericsson P. Juliene PinaMody, MD, <Dictator> Janyth ContesSITAL P Quanta Roher MD ELECTRONICALLY SIGNED 09/22/2013 12:10

## 2014-07-15 NOTE — Consult Note (Signed)
Given lack of signs of diverticulitis on gastrograffin colon study will stop Zosyn.  Likely home tomorrow.  Electronic Signatures: Scot JunElliott, Robert T (MD)  (Signed on 30-Jun-15 18:00)  Authored  Last Updated: 30-Jun-15 18:00 by Scot JunElliott, Robert T (MD)

## 2014-07-15 NOTE — Consult Note (Signed)
Brief Consult Note: Diagnosis: constipation, diverticulitis.   Patient was seen by consultant.   Consult note dictated.   Recommend further assessment or treatment.   Comments: This appears to be mostly constipation based on the KUB and the labs and hx.  Possible contribution from diverticulitis also.   Recs:  - molasses enema - miralax 34 gr BID ( will try crystal lite since gatorade caused nausea) - zofran 4 mg q8 hr - ok with levofloxacin for now for diverticulitis ( flagyl caused nausea, didn't tolerate augmentin).  Electronic Signatures: Dow Adolphein, Matthew (MD)  (Signed 30-Jun-15 10:15)  Authored: Brief Consult Note   Last Updated: 30-Jun-15 10:15 by Dow Adolphein, Matthew (MD)

## 2014-08-09 ENCOUNTER — Other Ambulatory Visit: Payer: Self-pay | Admitting: Internal Medicine

## 2014-09-18 ENCOUNTER — Other Ambulatory Visit: Payer: Self-pay | Admitting: *Deleted

## 2014-09-18 MED ORDER — METOPROLOL SUCCINATE ER 25 MG PO TB24
12.5000 mg | ORAL_TABLET | Freq: Every day | ORAL | Status: DC
Start: 2014-09-18 — End: 2018-02-02

## 2014-11-02 ENCOUNTER — Encounter: Payer: Self-pay | Admitting: Internal Medicine

## 2014-11-02 ENCOUNTER — Ambulatory Visit (INDEPENDENT_AMBULATORY_CARE_PROVIDER_SITE_OTHER): Payer: BLUE CROSS/BLUE SHIELD | Admitting: Internal Medicine

## 2014-11-02 VITALS — BP 108/70 | HR 68 | Temp 98.1°F | Ht 61.5 in

## 2014-11-02 DIAGNOSIS — Z8669 Personal history of other diseases of the nervous system and sense organs: Secondary | ICD-10-CM | POA: Diagnosis not present

## 2014-11-02 DIAGNOSIS — K219 Gastro-esophageal reflux disease without esophagitis: Secondary | ICD-10-CM

## 2014-11-02 DIAGNOSIS — N3010 Interstitial cystitis (chronic) without hematuria: Secondary | ICD-10-CM

## 2014-11-02 DIAGNOSIS — R5383 Other fatigue: Secondary | ICD-10-CM | POA: Diagnosis not present

## 2014-11-02 DIAGNOSIS — E7800 Pure hypercholesterolemia, unspecified: Secondary | ICD-10-CM

## 2014-11-02 DIAGNOSIS — R928 Other abnormal and inconclusive findings on diagnostic imaging of breast: Secondary | ICD-10-CM

## 2014-11-02 LAB — VITAMIN B12: Vitamin B-12: 382 pg/mL (ref 211–911)

## 2014-11-02 NOTE — Progress Notes (Signed)
Patient ID: Savannah Gomez, female   DOB: November 15, 1955, 59 y.o.   MRN: [id number]   Subjective:    Patient ID: Savannah Gomez, female    DOB: 11-17-1955, 59 y.o.   MRN: [id number]  HPI  Patient here for a scheduled follow up.  Increased stress with her work.  Feels she is handling things relatively well.  Does not feel she needs any further intervention at this time.  No cardiac symptoms with increased activity or exertion.  No sob.  Palpitations better.  Seeing Dr Lady Gary.  Eating and drinking well.  No nausea or vomiting.  Bowels stable.  No problems with acid reflux.     Past Medical History  Diagnosis Date  . Asthma   . Arthritis   . GERD (gastroesophageal reflux disease)   . Allergy   . Chicken pox   . Migraine headache   . Urine incontinence     Family history and social history reviewed.    Outpatient Encounter Prescriptions as of 11/02/2014  Medication Sig  . acetaminophen (TYLENOL) 500 MG tablet Take 500 mg by mouth every 6 (six) hours as needed for pain.  . calcium carbonate (CALCIUM 600) 600 MG TABS tablet Take 600 mg by mouth daily with breakfast.  . cetirizine (ZYRTEC) 10 MG tablet Take 10 mg by mouth daily.  Marland Kitchen dexlansoprazole (DEXILANT) 60 MG capsule Take 60 mg by mouth daily.  Marland Kitchen diltiazem (CARDIZEM CD) 120 MG 24 hr capsule   . Docusate Calcium (STOOL SOFTENER PO) Take by mouth daily.  Marland Kitchen estradiol (ESTRACE) 1 MG tablet TAKE 2 TABLETS BY MOUTH EVERY OTHER DAY AND 1 TABLET EVERY OTHER DAY  . imipramine (TOFRANIL) 25 MG tablet Take 25 mg by mouth 2 (two) times daily.  . metoprolol succinate (TOPROL-XL) 25 MG 24 hr tablet Take 0.5 tablets (12.5 mg total) by mouth daily. (Patient taking differently: Take 25 mg by mouth daily. )  . mirabegron ER (MYRBETRIQ) 25 MG TB24 Take 25 mg by mouth 2 (two) times daily.  Marland Kitchen topiramate (TOPAMAX) 50 MG tablet Take 50 mg by mouth 2 (two) times daily.   No facility-administered encounter medications on file as of 11/02/2014.    Review of  Systems  Constitutional: Negative for appetite change and unexpected weight change.  HENT: Negative for congestion and sinus pressure.   Respiratory: Negative for cough, chest tightness and shortness of breath.   Cardiovascular: Negative for chest pain, palpitations and leg swelling.  Gastrointestinal: Negative for nausea, vomiting, abdominal pain and diarrhea.  Skin: Negative for color change and rash.  Neurological: Negative for dizziness, light-headedness and headaches.  Psychiatric/Behavioral: Negative for dysphoric mood and agitation.       Objective:    Physical Exam  Constitutional: She appears well-developed and well-nourished. No distress.  HENT:  Nose: Nose normal.  Mouth/Throat: Oropharynx is clear and moist.  Neck: Neck supple. No thyromegaly present.  Cardiovascular: Normal rate and regular rhythm.   Pulmonary/Chest: Breath sounds normal. No respiratory distress. She has no wheezes.  Abdominal: Soft. Bowel sounds are normal. There is no tenderness.  Musculoskeletal: She exhibits no edema or tenderness.  Lymphadenopathy:    She has no cervical adenopathy.  Skin: No rash noted. No erythema.  Psychiatric: She has a normal mood and affect. Her behavior is normal.    BP 108/70 mmHg  Pulse 68  Temp(Src) 98.1 F (36.7 C) (Oral)  Ht 5' 1.5" (1.562 m)  SpO2 97% Wt Readings from Last 3 Encounters:  05/02/14 164  lb (74.39 kg)  06/10/13 165 lb 4 oz (74.957 kg)  06/07/13 167 lb (75.751 kg)     Lab Results  Component Value Date   WBC 5.2 06/22/2014   HGB 13.0 06/22/2014   HCT 38.1 06/22/2014   PLT 215.0 06/22/2014   GLUCOSE 92 05/26/2014   CHOL 191 05/26/2014   TRIG 49.0 05/26/2014   HDL 86.60 05/26/2014   LDLCALC 95 05/26/2014   ALT 17 05/26/2014   AST 18 05/26/2014   NA 138 05/26/2014   K 4.0 05/26/2014   CL 106 05/26/2014   CREATININE 1.03 05/26/2014   BUN 17 05/26/2014   CO2 26 05/26/2014   TSH 2.52 05/26/2014       Assessment & Plan:   Problem  List Items Addressed This Visit    Abnormal mammogram    Mammogram 05/02/14 - recommended f/u views.  F/u views 05/19/14 - [facility] II.  Recommended f/u in one year.        Fatigue - Primary    Desires to have B12 checked.  Check today.        Relevant Orders   Vitamin B12 (Completed)   GERD (gastroesophageal reflux disease)    Controlled on dexilant.  Sees Dr Markham Jordan.        History of migraine headaches    Stable on Topamax.  Sees Dr Sherryll Burger.  Headaches better.        Hypercholesteremia    Low cholesterol diet and exercise.  Follow lipid panel.        Interstitial cystitis    On imipramine.  Sees Dr Achilles Dunk.            Dale [place], MD

## 2014-11-02 NOTE — Progress Notes (Signed)
Pre visit review using our clinic review tool, if applicable. No additional management support is needed unless otherwise documented below in the visit note. 

## 2014-11-03 ENCOUNTER — Encounter: Payer: Self-pay | Admitting: *Deleted

## 2014-11-04 ENCOUNTER — Encounter: Payer: Self-pay | Admitting: Internal Medicine

## 2014-11-04 DIAGNOSIS — R928 Other abnormal and inconclusive findings on diagnostic imaging of breast: Secondary | ICD-10-CM

## 2014-11-04 NOTE — Assessment & Plan Note (Signed)
Stable on Topamax.  Sees Dr Sherryll Burger.  Headaches better.

## 2014-11-04 NOTE — Assessment & Plan Note (Signed)
Low cholesterol diet and exercise.  Follow lipid panel.   

## 2014-11-04 NOTE — Assessment & Plan Note (Signed)
On imipramine.  Sees Dr Achilles Dunk.

## 2014-11-04 NOTE — Assessment & Plan Note (Signed)
Mammogram 05/02/14 - recommended f/u views.  F/u views 05/19/14 - [facility] II.  Recommended f/u in one year.

## 2014-11-04 NOTE — Assessment & Plan Note (Signed)
Controlled on dexilant.  Sees Dr Elliot.   

## 2014-11-04 NOTE — Assessment & Plan Note (Signed)
Desires to have B12 checked.  Check today.

## 2014-11-06 ENCOUNTER — Encounter: Payer: Self-pay | Admitting: *Deleted

## 2014-11-07 ENCOUNTER — Encounter: Payer: Self-pay | Admitting: Internal Medicine

## 2014-11-07 DIAGNOSIS — Z800 Family history of malignant neoplasm of digestive organs: Secondary | ICD-10-CM | POA: Insufficient documentation

## 2014-11-17 ENCOUNTER — Encounter: Payer: Self-pay | Admitting: Internal Medicine

## 2015-02-01 ENCOUNTER — Encounter: Payer: Self-pay | Admitting: Emergency Medicine

## 2015-02-01 ENCOUNTER — Emergency Department
Admission: EM | Admit: 2015-02-01 | Discharge: 2015-02-01 | Disposition: A | Discharge status: Home / Self Care | Payer: BLUE CROSS/BLUE SHIELD | Attending: Emergency Medicine | Admitting: Emergency Medicine

## 2015-02-01 ENCOUNTER — Emergency Department: Payer: BLUE CROSS/BLUE SHIELD

## 2015-02-01 DIAGNOSIS — J168 Pneumonia due to other specified infectious organisms: Secondary | ICD-10-CM | POA: Diagnosis not present

## 2015-02-01 DIAGNOSIS — R05 Cough: Secondary | ICD-10-CM | POA: Diagnosis present

## 2015-02-01 DIAGNOSIS — J45909 Unspecified asthma, uncomplicated: Secondary | ICD-10-CM | POA: Diagnosis not present

## 2015-02-01 DIAGNOSIS — E860 Dehydration: Secondary | ICD-10-CM | POA: Insufficient documentation

## 2015-02-01 DIAGNOSIS — Z79899 Other long term (current) drug therapy: Secondary | ICD-10-CM | POA: Insufficient documentation

## 2015-02-01 DIAGNOSIS — J189 Pneumonia, unspecified organism: Secondary | ICD-10-CM

## 2015-02-01 DIAGNOSIS — R531 Weakness: Secondary | ICD-10-CM | POA: Insufficient documentation

## 2015-02-01 DIAGNOSIS — R42 Dizziness and giddiness: Secondary | ICD-10-CM | POA: Diagnosis not present

## 2015-02-01 DIAGNOSIS — J181 Lobar pneumonia, unspecified organism: Secondary | ICD-10-CM

## 2015-02-01 LAB — BASIC METABOLIC PANEL
ANION GAP: 2 — AB (ref 5–15)
BUN: 10 mg/dL (ref 6–20)
CALCIUM: 8.6 mg/dL — AB (ref 8.9–10.3)
CO2: 24 mmol/L (ref 22–32)
CREATININE: 1 mg/dL (ref 0.44–1.00)
Chloride: 107 mmol/L (ref 101–111)
GFR, EST NON AFRICAN AMERICAN: 60 mL/min — AB (ref >60)
Glucose, Bld: 113 mg/dL — ABNORMAL HIGH (ref 65–99)
Potassium: 3.5 mmol/L (ref 3.5–5.1)
SODIUM: 133 mmol/L — AB (ref 135–145)

## 2015-02-01 LAB — CBC
HCT: 42.6 % (ref 35.0–47.0)
HEMOGLOBIN: 14.5 g/dL (ref 12.0–16.0)
MCH: 29 pg (ref 26.0–34.0)
MCHC: 34 g/dL (ref 32.0–36.0)
MCV: 85.3 fL (ref 80.0–100.0)
PLATELETS: 155 10*3/uL (ref 150–440)
RBC: 5 MIL/uL (ref 3.80–5.20)
RDW: 13.5 % (ref 11.5–14.5)
WBC: 3.1 10*3/uL — AB (ref 3.6–11.0)

## 2015-02-01 LAB — URINALYSIS COMPLETE WITH MICROSCOPIC (ARMC ONLY)
BILIRUBIN URINE: NEGATIVE
Glucose, UA: NEGATIVE mg/dL
HGB URINE DIPSTICK: NEGATIVE
LEUKOCYTES UA: NEGATIVE
NITRITE: NEGATIVE
PH: 5 (ref 5.0–8.0)
Protein, ur: 30 mg/dL — AB
SPECIFIC GRAVITY, URINE: 1.023 (ref 1.005–1.030)

## 2015-02-01 MED ORDER — DOXYCYCLINE HYCLATE 100 MG PO TABS
100.0000 mg | ORAL_TABLET | Freq: Once | ORAL | Status: AC
  Administered 2015-02-01: 100 mg via ORAL
  Filled 2015-02-01: qty 1

## 2015-02-01 MED ORDER — DOXYCYCLINE HYCLATE 100 MG PO TABS: 100.0000 mg | ORAL_TABLET | Freq: Once | ORAL | Status: DC

## 2015-02-01 NOTE — ED Provider Notes (Signed)
[facility] Emergency Department Keegan Bensch Note  ____________________________________________  Time seen: 1420  I have reviewed the triage vital signs and the nursing notes.   HISTORY  Chief Complaint Cough; Dizziness; and Weakness     HPI Savannah Gomez is a 59 y.o. female who developed a cough this past Friday and Saturday. She was seen at [facility] acute care on Monday. They prescribed a hydrocodone cough syrup for evening use, Tessalon Perles, and Zofran. She is also started on Flonase. They did not prescribe any antibiotics. She has been taking the above-mentioned medications except for the Zofran.  The patient presents to the emergency department today due to no change in her symptoms. She continues to have a mild to moderate cough. She also reports she feels a little bit weak and off balance. She has a dry mouth and is concerned about dehydration. She has been tolerating liquids by mouth. She reports she drank a 16 ounce bottle of 4 this morning as well as some tea.  She does confirm that she has a little bit of a sore throat. She denies any significant congestion.  She does report she has a mild history of asthma, with symptoms that crop up when she has a cold. No ongoing asthma medications.   Past Medical History  Diagnosis Date  . Asthma   . Arthritis   . GERD (gastroesophageal reflux disease)   . Allergy   . Chicken pox   . Migraine headache   . Urine incontinence     Patient Active Problem List   Diagnosis Date Noted  . Family history of colon cancer 11/07/2014  . Abnormal mammogram 11/04/2014  . Health care maintenance 05/07/2014  . Fatigue 02/23/2013  . History of migraine headaches 01/18/2012  . Interstitial cystitis 01/18/2012  . Hypercholesteremia 01/18/2012  . GERD (gastroesophageal reflux disease) 01/18/2012    Past Surgical History  Procedure Laterality Date  . Vaginal hysterectomy  1987    secondary to fibroids  . Nasal  sinus surgery  1992  . Rotator cuff repair  1994  . Hand surgery  1999    broken finger - pins  . Elbow surgery  1999  . Carpal tunnel sugery      bilateral  . Trigger finger surgery  1999 and 2000    Current Outpatient Rx  Name  Route  Sig  Dispense  Refill  . acetaminophen (TYLENOL) 500 MG tablet   Oral   Take 500 mg by mouth every 6 (six) hours as needed for pain.         . calcium carbonate (CALCIUM 600) 600 MG TABS tablet   Oral   Take 600 mg by mouth daily with breakfast.         . cetirizine (ZYRTEC) 10 MG tablet   Oral   Take 10 mg by mouth daily.         Marland Kitchen dexlansoprazole (DEXILANT) 60 MG capsule   Oral   Take 60 mg by mouth daily.         Marland Kitchen diltiazem (CARDIZEM CD) 120 MG 24 hr capsule               . Docusate Calcium (STOOL SOFTENER PO)   Oral   Take by mouth daily.         Marland Kitchen doxycycline (VIBRA-TABS) 100 MG tablet   Oral   Take 1 tablet (100 mg total) by mouth once.   14 tablet   0   . estradiol (  ESTRACE) 1 MG tablet      TAKE 2 TABLETS BY MOUTH EVERY OTHER DAY AND 1 TABLET EVERY OTHER DAY   45 tablet   0   . imipramine (TOFRANIL) 25 MG tablet   Oral   Take 25 mg by mouth 2 (two) times daily.         . metoprolol succinate (TOPROL-XL) 25 MG 24 hr tablet   Oral   Take 0.5 tablets (12.5 mg total) by mouth daily. Patient taking differently: Take 25 mg by mouth daily.    15 tablet   5   . mirabegron ER (MYRBETRIQ) 25 MG TB24   Oral   Take 25 mg by mouth 2 (two) times daily.         Marland Kitchen topiramate (TOPAMAX) 50 MG tablet   Oral   Take 50 mg by mouth 2 (two) times daily.           Allergies Demerol  Family History  Problem Relation Age of Onset  . Colon cancer Mother     colon  . Asthma Mother   . Heart disease Father   . Hypertension Father   . Colon cancer Sister   . Stroke Maternal Grandfather   . Lung cancer Maternal Grandmother   . Breast cancer Maternal Grandmother   . Diabetes Maternal Grandfather      Social History Social History  Substance Use Topics  . Smoking status: Never Smoker   . Smokeless tobacco: Never Used  . Alcohol Use: No    Review of Systems  Constitutional: Mild general weakness with a little bit of dizziness. See history of present illness. ENT: Negative for congestion. Positive for sore throat. Cardiovascular: Negative for chest pain. Respiratory: Positive for cough. See history of present illness Gastrointestinal: Negative for abdominal pain, vomiting and diarrhea. Genitourinary: Negative for dysuria. Musculoskeletal: No myalgias or injuries. Skin: Negative for rash. Neurological: Negative for headache or focal weakness   10-point ROS otherwise negative.  ____________________________________________   PHYSICAL EXAM:  VITAL SIGNS: ED Triage Vitals  Enc Vitals Group     BP 02/01/15 1341 137/36 mmHg     Pulse Rate 02/01/15 1341 105     Resp 02/01/15 1341 18     Temp 02/01/15 1341 98.9 F (37.2 C)     Temp Source 02/01/15 1341 Oral     SpO2 02/01/15 1341 100 %     Weight 02/01/15 1341 165 lb (74.844 kg)     Height 02/01/15 1341  (1.6 m)     Head Cir --      Peak Flow --      Pain Score 02/01/15 1343 0     Pain Loc --      Pain Edu? --      Excl. in GC? --     Constitutional: Alert and oriented. Well appearing and in no distress. ENT   Head: Normocephalic and atraumatic.   Nose: No congestion/rhinnorhea.       Mouth: No erythema, no swelling   Cardiovascular: Normal rate, regular rhythm, no murmur noted Respiratory:  Normal respiratory effort, no tachypnea.    Breath sounds are clear and equal bilaterally.    No wheezing. Gastrointestinal: Soft and nontender. No distention.  Back: No muscle spasm, no tenderness, no CVA tenderness. Musculoskeletal: No deformity noted. Nontender with normal range of motion in all extremities.  No noted edema. Neurologic:  Communicative. Normal appearing spontaneous movement in all 4  extremities. Ambulatory. Equal grip strength. Negative pronator  drift and negative Romberg. Good finger to nose motion. No gross focal neurologic deficits are appreciated.  Skin:  Skin is warm, dry. No rash noted. Psychiatric: Mood and affect are normal. Speech and behavior are normal.  ____________________________________________    LABS (pertinent positives/negatives)  Labs Reviewed  BASIC METABOLIC PANEL - Abnormal; Notable for the following:    Sodium 133 (*)    Glucose, Bld 113 (*)    Calcium 8.6 (*)    GFR calc non Af Amer 60 (*)    Anion gap 2 (*)    All other components within normal limits  CBC - Abnormal; Notable for the following:    WBC 3.1 (*)    All other components within normal limits  URINALYSIS COMPLETEWITH MICROSCOPIC ([facility] ONLY) - Abnormal; Notable for the following:    Color, Urine YELLOW (*)    APPearance CLEAR (*)    Ketones, ur TRACE (*)    Protein, ur 30 (*)    Bacteria, UA RARE (*)    Squamous Epithelial / LPF 0-5 (*)    All other components within normal limits  CBG MONITORING, ED     ____________________________________________   EKG  ED ECG REPORT I, KAMINSKI,DAVID W, the attending physician, personally viewed and interpreted this ECG.   Date: 02/01/2015  EKG Time: 1352  Rate: 90  Rhythm: Normal sinus rhythm  Axis: Normal  Intervals: Normal  ST&T Change: None noted   ____________________________________________    RADIOLOGY  Chest x-ray:  FINDINGS: Cardiomediastinal silhouette unremarkable. Airspace consolidation in the right middle lobe. Lungs otherwise clear. Bronchovascular markings normal. Pulmonary vascularity normal. No pleural effusions. No pneumothorax. 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  IMPRESSION: Acute right middle lobe pneumonia.   ____________________________________________   PROCEDURES    ____________________________________________   INITIAL  IMPRESSION / ASSESSMENT AND PLAN / ED COURSE  Pertinent labs & imaging results that were available during my care of the patient were reviewed by me and considered in my medical decision making (see chart for details).  Pleasant, overall well-appearing [age]-year-old female in no acute distress. She is breathing comfortably without wheezing. She is ambulatory without any noted focal neurologic deficits. Her mild symptoms may be due to what I suspect is a viral illness or due to side effects from some of the medications she is currently taking. We will check a chest x-ray and ensure there is no infiltrate. Labs are pending. If they appear well, we will not pursue IV rehydration but let her continue oral rehydration which she is tolerating well.  ----------------------------------------- 3:48 PM on 02/01/2015 -----------------------------------------  X-ray shows acute right middle lobe pneumonia. Blood tests overall look good with normal electrolytes and renal function. Her white blood cell count is a little bit low at 3.1k.  Reexamined this time finds the patient comfortable. Her heart rate is 96. Her oxygen saturation level on room air is 100%.  We will start her on doxycycline. I counseled her and her husband on the diagnosis, treatment, and the need for follow-up.   ____________________________________________   FINAL CLINICAL IMPRESSION(S) / ED DIAGNOSES  Final diagnoses:  Pneumonia of right middle lobe due to infectious organism      Darien Ramusavid W Kaminski, MD 02/01/15 1557

## 2015-02-01 NOTE — Discharge Instructions (Signed)
Take doxycycline for the right middle lobe pneumonia that has been seen on x-ray today. Follow-up with her regular doctor early this coming week. Return to the emergency department if you have worsening shortness of breath, chest pain, uncontrolled fevers, or other urgent concerns.  Community-Acquired Pneumonia, Adult Pneumonia is an infection of the lungs. One type of pneumonia can happen while a person is in a hospital. A different type can happen when a person is not in a hospital (community-acquired pneumonia). It is easy for this kind to spread from person to person. It can spread to you if you breathe near an infected person who coughs or sneezes. Some symptoms include:  A dry cough.  A wet (productive) cough.  Fever.  Sweating.  Chest pain. HOME CARE  Take over-the-counter and prescription medicines only as told by your doctor.  Only take cough medicine if you are losing sleep.  If you were prescribed an antibiotic medicine, take it as told by your doctor. Do not stop taking the antibiotic even if you start to feel better.  Sleep with your head and neck raised (elevated). You can do this by putting a few pillows under your head, or you can sleep in a recliner.  Do not use tobacco products. These include cigarettes, chewing tobacco, and e-cigarettes. If you need help quitting, ask your doctor.  Drink enough water to keep your pee (urine) clear or pale yellow. A shot (vaccine) can help prevent pneumonia. Shots are often suggested for:  People older than 60 years of age.  People older than 60 years of age:  Who are having cancer treatment.  Who have long-term (chronic) lung disease.  Who have problems with their body's defense system (immune system). You may also prevent pneumonia if you take these actions:  Get the flu (influenza) shot every year.  Go to the dentist as often as told.  Wash your hands often. If soap and water are not available, use hand  sanitizer. GET HELP IF:  You have a fever.  You lose sleep because your cough medicine does not help. GET HELP RIGHT AWAY IF:  You are short of breath and it gets worse.  You have more chest pain.  Your sickness gets worse. This is very serious if:  You are an older adult.  Your body's defense system is weak.  You cough up blood.   This information is not intended to replace advice given to you by your health care provider. Make sure you discuss any questions you have with your health care provider.   Document Released: 08/27/2007 Document Revised: 11/29/2014 Document Reviewed: 07/05/2014 Elsevier Interactive Patient Education [employer]2016 Elsevier Inc.

## 2015-02-01 NOTE — ED Notes (Addendum)
Pt states she was dx with URI on Monday and given 2 cough medications and 1 nasal spray, states she feels dizzy and weak, states "I feel like I am dehydrated from the medicine I have been taking",l states she did not drink fluids yesterday due to being in bed all day, also states her urine has been very dark in color

## 2015-02-01 NOTE — ED Notes (Signed)
Discussed discharge instructions, prescriptions, and follow-up care with patient. No questions or concerns at this time. Pt stable at discharge.  

## 2015-02-06 ENCOUNTER — Ambulatory Visit (INDEPENDENT_AMBULATORY_CARE_PROVIDER_SITE_OTHER): Payer: BLUE CROSS/BLUE SHIELD | Admitting: Nurse Practitioner

## 2015-02-06 ENCOUNTER — Encounter: Payer: Self-pay | Admitting: Nurse Practitioner

## 2015-02-06 VITALS — BP 110/70 | HR 80 | Temp 97.9°F | Resp 16 | Wt 165.0 lb

## 2015-02-06 DIAGNOSIS — J189 Pneumonia, unspecified organism: Secondary | ICD-10-CM

## 2015-02-06 NOTE — Progress Notes (Signed)
Patient ID: Savannah Gomez, female    DOB: 03/18/1956  Age: 59 y.o. MRN: [id number]  CC: Follow-up   HPI MYKIRA HOFMEISTER presents for HFU for pneumonia.   1) [facility] ED 02/01/15 for cough, dizziness, and weakness Seen previously by [facility] Acute Care on the Monday prio Rxs- hycodan syrup, tessalon, zofran, flonase (did not start zofran) Pt felt worse and proceeded to the ED X-ray Chest- Acute Right Middle Lobe Pneumonia Started on Doxycyline bid x 7 days  Today: Feeling improved today. Finishing up Doxycyline. Headaches and coughing still apparent.    History [name] has a past medical history of Asthma; Arthritis; GERD (gastroesophageal reflux disease); Allergy; Chicken pox; Migraine headache; and Urine incontinence.   She has past surgical history that includes Vaginal hysterectomy (1987); Nasal sinus surgery (1992); Rotator cuff repair (1994); Hand surgery (1999); Elbow surgery (1999); carpal tunnel sugery; and Trigger finger surgery (1999 and 2000).   Her family history includes Asthma in her mother; Breast cancer in her maternal grandmother; Colon cancer in her mother and sister; Diabetes in her maternal grandfather; Heart disease in her father; Hypertension in her father; Lung cancer in her maternal grandmother; Stroke in her maternal grandfather.She reports that she has never smoked. She has never used smokeless tobacco. She reports that she does not drink alcohol or use illicit drugs.  Outpatient Prescriptions Prior to Visit  Medication Sig Dispense Refill  . calcium carbonate (CALCIUM 600) 600 MG TABS tablet Take 600 mg by mouth daily with breakfast.    . cetirizine (ZYRTEC) 10 MG tablet Take 10 mg by mouth daily.    Marland Kitchen dexlansoprazole (DEXILANT) 60 MG capsule Take 60 mg by mouth daily.    Marland Kitchen diltiazem (CARDIZEM CD) 120 MG 24 hr capsule     . Docusate Calcium (STOOL SOFTENER PO) Take by mouth daily.    Marland Kitchen estradiol (ESTRACE) 1 MG tablet TAKE 2 TABLETS BY MOUTH EVERY OTHER DAY  AND 1 TABLET EVERY OTHER DAY 45 tablet 0  . imipramine (TOFRANIL) 25 MG tablet Take 25 mg by mouth 2 (two) times daily.    . metoprolol succinate (TOPROL-XL) 25 MG 24 hr tablet Take 0.5 tablets (12.5 mg total) by mouth daily. (Patient taking differently: Take 25 mg by mouth daily. ) 15 tablet 5  . mirabegron ER (MYRBETRIQ) 25 MG TB24 Take 25 mg by mouth 2 (two) times daily.    Marland Kitchen topiramate (TOPAMAX) 50 MG tablet Take 50 mg by mouth 2 (two) times daily.    Marland Kitchen acetaminophen (TYLENOL) 500 MG tablet Take 500 mg by mouth every 6 (six) hours as needed for pain.    Marland Kitchen doxycycline (VIBRA-TABS) 100 MG tablet Take 1 tablet (100 mg total) by mouth once. 14 tablet 0   No facility-administered medications prior to visit.    ROS Review of Systems  Constitutional: Negative for fever, chills, diaphoresis and fatigue.  Respiratory: Positive for cough. Negative for chest tightness, shortness of breath and wheezing.   Cardiovascular: Negative for chest pain, palpitations and leg swelling.  Gastrointestinal: Negative for nausea, vomiting and diarrhea.  Skin: Negative for rash.  Neurological: Positive for headaches. Negative for dizziness, weakness and numbness.  Psychiatric/Behavioral: The patient is not nervous/anxious.     Objective:  BP 110/70 mmHg  Pulse 80  Temp(Src) 97.9 F (36.6 C) (Oral)  Resp 16  Wt 165 lb (74.844 kg)  SpO2 98%  Physical Exam  Constitutional: She is oriented to person, place, and time. She appears well-developed and well-nourished. No  distress.  HENT:  Head: Normocephalic and atraumatic.  Right Ear: External ear normal.  Left Ear: External ear normal.  Mouth/Throat: No oropharyngeal exudate.  Eyes: EOM are normal. Pupils are equal, round, and reactive to light. Right eye exhibits no discharge. Left eye exhibits no discharge. No scleral icterus.  Cardiovascular: Normal rate, regular rhythm and normal heart sounds.  Exam reveals no gallop and no friction rub.   No murmur  heard. Pulmonary/Chest: Effort normal. No respiratory distress. She has no wheezes. She has no rales. She exhibits no tenderness.  Slightly decreased on right side lower to middle lobe areas.  Neurological: She is alert and oriented to person, place, and time. No cranial nerve deficit. She exhibits normal muscle tone. Coordination normal.  Skin: Skin is warm and dry. No rash noted. She is not diaphoretic.  Psychiatric: She has a normal mood and affect. Her behavior is normal. Judgment and thought content normal.   Assessment & Plan:   Aram BeechamCynthia was seen today for follow-up.  Diagnoses and all orders for this visit:  CAP (community acquired pneumonia) -     DG Chest 2 View; Future   I am having [name] maintain her topiramate, dexlansoprazole, imipramine, mirabegron ER, diltiazem, cetirizine, calcium carbonate, Docusate Calcium (STOOL SOFTENER PO), estradiol, and metoprolol succinate.  No orders of the defined types were placed in this encounter.     Follow-up: Return if symptoms worsen or fail to improve.

## 2015-02-06 NOTE — Patient Instructions (Addendum)
Mucinex over the counter (plain; NOT DM or D)- This will help move mucous out and thin it  [facility] from 11/10 (12/8 approximately)   Community-Acquired Pneumonia, Adult Pneumonia is an infection of the lungs. There are different types of pneumonia. One type can develop while a person is in a hospital. A different type, called community-acquired pneumonia, develops in people who are not, or have not recently been, in the hospital or other health care facility.  CAUSES Pneumonia may be caused by bacteria, viruses, or funguses. Community-acquired pneumonia is often caused by Streptococcus pneumonia bacteria. These bacteria are often passed from one person to another by breathing in droplets from the cough or sneeze of an infected person. RISK FACTORS The condition is more likely to develop in:  People who havechronic diseases, such as chronic obstructive pulmonary disease (COPD), asthma, congestive heart failure, cystic fibrosis, diabetes, or kidney disease.  People who haveearly-stage or late-stage HIV.  People who havesickle cell disease.  People who havehad their spleen removed (splenectomy).  People who havepoor Administratordental hygiene.  People who havemedical conditions that increase the risk of breathing in (aspirating) secretions their own mouth and nose.   People who havea weakened immune system (immunocompromised).  People who smoke.  People whotravel to areas where pneumonia-causing germs commonly exist.  People whoare around animal habitats or animals that have pneumonia-causing germs, including birds, bats, rabbits, cats, and farm animals. SYMPTOMS Symptoms of this condition include:  Adry cough.  A wet (productive) cough.  Fever.  Sweating.  Chest pain, especially when breathing deeply or coughing.  Rapid breathing or difficulty breathing.  Shortness of breath.  Shaking chills.  Fatigue.  Muscle aches. DIAGNOSIS Your health  care provider will take a medical history and perform a physical exam. You may also have other tests, including:  Imaging studies of your chest, including X-rays.  Tests to check your blood oxygen level and other blood gases.  Other tests on blood, mucus (sputum), fluid around your lungs (pleural fluid), and urine. If your pneumonia is severe, other tests may be done to identify the specific cause of your illness. TREATMENT The type of treatment that you receive depends on many factors, such as the cause of your pneumonia, the medicines you take, and other medical conditions that you have. For most adults, treatment and recovery from pneumonia may occur at home. In some cases, treatment must happen in a hospital. Treatment may include:  Antibiotic medicines, if the pneumonia was caused by bacteria.  Antiviral medicines, if the pneumonia was caused by a virus.  Medicines that are given by mouth or through an IV tube.  Oxygen.  Respiratory therapy. Although rare, treating severe pneumonia may include:  Mechanical ventilation. This is done if you are not breathing well on your own and you cannot maintain a safe blood oxygen level.  Thoracentesis. This procedureremoves fluid around one lung or both lungs to help you breathe better. HOME CARE INSTRUCTIONS  Take over-the-counter and prescription medicines only as told by your health care provider.  Only takecough medicine if you are losing sleep. Understand that cough medicine can prevent your body's natural ability to remove mucus from your lungs.  If you were prescribed an antibiotic medicine, take it as told by your health care provider. Do not stop taking the antibiotic even if you start to feel better.  Sleep in a semi-upright position at night. Try sleeping in a reclining chair, or place a few pillows  under your head.  Do not use tobacco products, including cigarettes, chewing tobacco, and e-cigarettes. If you need help quitting,  ask your health care provider.  Drink enough water to keep your urine clear or pale yellow. This will help to thin out mucus secretions in your lungs. PREVENTION There are ways that you can decrease your risk of developing community-acquired pneumonia. Consider getting a pneumococcal vaccine if:  You are older than 60 years of age.  You are older than 60 years of age and are undergoing cancer treatment, have chronic lung disease, or have other medical conditions that affect your immune system. Ask your health care provider if this applies to you. There are different types and schedules of pneumococcal vaccines. Ask your health care provider which vaccination option is best for you. You may also prevent community-acquired pneumonia if you take these actions:  Get an influenza vaccine every year. Ask your health care provider which type of influenza vaccine is best for you.  Go to the dentist on a regular basis.  Wash your hands often. Use hand sanitizer if soap and water are not available. SEEK MEDICAL CARE IF:  You have a fever.  You are losing sleep because you cannot control your cough with cough medicine. SEEK IMMEDIATE MEDICAL CARE IF:  You have worsening shortness of breath.  You have increased chest pain.  Your sickness becomes worse, especially if you are an older adult or have a weakened immune system.  You cough up blood.   This information is not intended to replace advice given to you by your health care provider. Make sure you discuss any questions you have with your health care provider.   Document Released: 03/10/2005 Document Revised: 11/29/2014 Document Reviewed: 07/05/2014 Elsevier Interactive Patient Education [employer].

## 2015-02-06 NOTE — Progress Notes (Signed)
Pre visit review using our clinic review tool, if applicable. No additional management support is needed unless otherwise documented below in the visit note. 

## 2015-02-11 ENCOUNTER — Emergency Department
Admission: EM | Admit: 2015-02-11 | Discharge: 2015-02-11 | Disposition: A | Discharge status: Home / Self Care | Payer: BLUE CROSS/BLUE SHIELD | Attending: Emergency Medicine | Admitting: Emergency Medicine

## 2015-02-11 ENCOUNTER — Emergency Department: Payer: BLUE CROSS/BLUE SHIELD

## 2015-02-11 ENCOUNTER — Encounter: Payer: Self-pay | Admitting: Emergency Medicine

## 2015-02-11 DIAGNOSIS — J159 Unspecified bacterial pneumonia: Secondary | ICD-10-CM | POA: Diagnosis not present

## 2015-02-11 DIAGNOSIS — Z79899 Other long term (current) drug therapy: Secondary | ICD-10-CM | POA: Insufficient documentation

## 2015-02-11 DIAGNOSIS — J45909 Unspecified asthma, uncomplicated: Secondary | ICD-10-CM | POA: Insufficient documentation

## 2015-02-11 DIAGNOSIS — J189 Pneumonia, unspecified organism: Secondary | ICD-10-CM

## 2015-02-11 DIAGNOSIS — R05 Cough: Secondary | ICD-10-CM | POA: Diagnosis present

## 2015-02-11 LAB — CBC WITH DIFFERENTIAL/PLATELET
BASOS ABS: 0 10*3/uL (ref 0–0.1)
Basophils Relative: 1 %
EOS PCT: 10 %
Eosinophils Absolute: 0.4 10*3/uL (ref 0–0.7)
HEMATOCRIT: 36.2 % (ref 35.0–47.0)
HEMOGLOBIN: 12.6 g/dL (ref 12.0–16.0)
LYMPHS PCT: 36 %
Lymphs Abs: 1.6 10*3/uL (ref 1.0–3.6)
MCH: 29.6 pg (ref 26.0–34.0)
MCHC: 34.7 g/dL (ref 32.0–36.0)
MCV: 85.1 fL (ref 80.0–100.0)
Monocytes Absolute: 0.3 10*3/uL (ref 0.2–0.9)
Monocytes Relative: 8 %
NEUTROS ABS: 2 10*3/uL (ref 1.4–6.5)
NEUTROS PCT: 45 %
PLATELETS: 210 10*3/uL (ref 150–440)
RBC: 4.25 MIL/uL (ref 3.80–5.20)
RDW: 13.7 % (ref 11.5–14.5)
WBC: 4.4 10*3/uL (ref 3.6–11.0)

## 2015-02-11 MED ORDER — AZITHROMYCIN 250 MG PO TABS: ORAL_TABLET | ORAL | Status: DC

## 2015-02-11 MED ORDER — BUTALBITAL-APAP-CAFFEINE 50-325-40 MG PO TABS: 1.0000 | ORAL_TABLET | Freq: Four times a day (QID) | ORAL | Status: AC | PRN

## 2015-02-11 MED ORDER — BUTALBITAL-APAP-CAFFEINE 50-325-40 MG PO TABS
1.0000 | ORAL_TABLET | Freq: Once | ORAL | Status: AC
  Administered 2015-02-11: 1 via ORAL
  Filled 2015-02-11: qty 1

## 2015-02-11 MED ORDER — GUAIFENESIN-CODEINE 100-10 MG/5ML PO SOLN: 10.0000 mL | ORAL | Status: DC | PRN

## 2015-02-11 NOTE — Discharge Instructions (Signed)

## 2015-02-11 NOTE — ED Provider Notes (Signed)
[facility] Emergency Department Provider Note  ____________________________________________  Time seen: Approximately 1:32 PM  I have reviewed the triage vital signs and the nursing notes.   HISTORY  Chief Complaint Cough    HPI Savannah Gomez is a 59 y.o. female who presents for follow-up from right middle lobe pneumonia diagnosed 10 days ago.   Past Medical History  Diagnosis Date  . Asthma   . Arthritis   . GERD (gastroesophageal reflux disease)   . Allergy   . Chicken pox   . Migraine headache   . Urine incontinence     Patient Active Problem List   Diagnosis Date Noted  . Family history of colon cancer 11/07/2014  . Abnormal mammogram 11/04/2014  . Health care maintenance 05/07/2014  . Fatigue 02/23/2013  . History of migraine headaches 01/18/2012  . Interstitial cystitis 01/18/2012  . Hypercholesteremia 01/18/2012  . GERD (gastroesophageal reflux disease) 01/18/2012    Past Surgical History  Procedure Laterality Date  . Vaginal hysterectomy  1987    secondary to fibroids  . Nasal sinus surgery  1992  . Rotator cuff repair  1994  . Hand surgery  1999    broken finger - pins  . Elbow surgery  1999  . Carpal tunnel sugery      bilateral  . Trigger finger surgery  1999 and 2000    Current Outpatient Rx  Name  Route  Sig  Dispense  Refill  . azithromycin (ZITHROMAX Z-PAK) 250 MG tablet      Take 2 tablets (500 mg) on  Day 1,  followed by 1 tablet (250 mg) once daily on Days 2 through 5.   6 each   0   . butalbital-acetaminophen-caffeine (FIORICET) 50-325-40 MG tablet   Oral   Take 1-2 tablets by mouth every 6 (six) hours as needed for headache.   20 tablet   0   . calcium carbonate (CALCIUM 600) 600 MG TABS tablet   Oral   Take 600 mg by mouth daily with breakfast.         . cetirizine (ZYRTEC) 10 MG tablet   Oral   Take 10 mg by mouth daily.         Marland Kitchen dexlansoprazole (DEXILANT) 60 MG capsule   Oral    Take 60 mg by mouth daily.         Marland Kitchen diltiazem (CARDIZEM CD) 120 MG 24 hr capsule               . Docusate Calcium (STOOL SOFTENER PO)   Oral   Take by mouth daily.         Marland Kitchen estradiol (ESTRACE) 1 MG tablet      TAKE 2 TABLETS BY MOUTH EVERY OTHER DAY AND 1 TABLET EVERY OTHER DAY   45 tablet   0   . guaiFENesin-codeine 100-10 MG/5ML syrup   Oral   Take 10 mLs by mouth every 4 (four) hours as needed for cough.   180 mL   0   . imipramine (TOFRANIL) 25 MG tablet   Oral   Take 25 mg by mouth 2 (two) times daily.         . metoprolol succinate (TOPROL-XL) 25 MG 24 hr tablet   Oral   Take 0.5 tablets (12.5 mg total) by mouth daily. Patient taking differently: Take 25 mg by mouth daily.    15 tablet   5   . mirabegron ER (MYRBETRIQ) 25 MG TB24  Oral   Take 25 mg by mouth 2 (two) times daily.         Marland Kitchen. topiramate (TOPAMAX) 50 MG tablet   Oral   Take 50 mg by mouth 2 (two) times daily.           Allergies Demerol  Family History  Problem Relation Age of Onset  . Colon cancer Mother     colon  . Asthma Mother   . Heart disease Father   . Hypertension Father   . Colon cancer Sister   . Stroke Maternal Grandfather   . Lung cancer Maternal Grandmother   . Breast cancer Maternal Grandmother   . Diabetes Maternal Grandfather     Social History Social History  Substance Use Topics  . Smoking status: Never Smoker   . Smokeless tobacco: Never Used  . Alcohol Use: No    Review of Systems Constitutional: No fever/chills Eyes: No visual changes. ENT: No sore throat. Cardiovascular: Denies chest pain. Respiratory: Denies shortness of breath. Continues with cough. Gastrointestinal: No abdominal pain.  No nausea, no vomiting.  No diarrhea.  No constipation. Genitourinary: Negative for dysuria. Musculoskeletal: Negative for back pain. Skin: Negative for rash. Neurological: Positive for headaches, negative for focal weakness or numbness.  10-point  ROS otherwise negative.  ____________________________________________   PHYSICAL EXAM:  VITAL SIGNS: ED Triage Vitals  Enc Vitals Group     BP 02/11/15 1253 113/59 mmHg     Pulse Rate 02/11/15 1253 66     Resp 02/11/15 1253 16     Temp 02/11/15 1253 97.6 F (36.4 C)     Temp Source 02/11/15 1253 Oral     SpO2 02/11/15 1253 100 %     Weight 02/11/15 1253 165 lb (74.844 kg)     Height 02/11/15 1253 5\' 3"  (1.6 m)     Head Cir --      Peak Flow --      Pain Score 02/11/15 1254 8     Pain Loc --      Pain Edu? --      Excl. in GC? --     Constitutional: Alert and oriented. Well appearing and in no acute distress. Eyes: Conjunctivae are normal. PERRL. EOMI. Head: Atraumatic. Nose: No congestion/rhinnorhea. Mouth/Throat: Mucous membranes are moist.  Oropharynx non-erythematous. Neck: No stridor.   Cardiovascular: Normal rate, regular rhythm. Grossly normal heart sounds.  Good peripheral circulation. Respiratory: Normal respiratory effort.  No retractions. Lungs CTAB with occasional rhonchi noted on the right lower lobe. Musculoskeletal: No lower extremity tenderness nor edema.  No joint effusions. Neurologic:  Normal speech and language. No gross focal neurologic deficits are appreciated. No gait instability. Skin:  Skin is warm, dry and intact. No rash noted. Psychiatric: Mood and affect are normal. Speech and behavior are normal.  ____________________________________________   LABS (all labs ordered are listed, but only abnormal results are displayed)  Labs Reviewed  CBC WITH DIFFERENTIAL/PLATELET     RADIOLOGY  Airspace disease in the right middle lobe has markedly improved. Suspect a small amount of residual disease and recommend continued follow-up to ensure complete resolution. No new pneumonia. ____________________________________________   PROCEDURES  Procedure(s) performed: None  Critical Care performed:  No  ____________________________________________   INITIAL IMPRESSION / ASSESSMENT AND PLAN / ED COURSE  Pertinent labs & imaging results that were available during my care of the patient were reviewed by me and considered in my medical decision making (see chart for details).  Continued pneumonia. Rx  given for Zithromax 5 days. Continue with Robitussin-AC as needed for cough congestion and follow up with PCP next week or return to the ER with any worsening symptomology. Urised given as needed for headaches. ____________________________________________   FINAL CLINICAL IMPRESSION(S) / ED DIAGNOSES  Final diagnoses:  Community acquired pneumonia      Evangeline Dakin, PA-C 02/11/15 1441  Governor Rooks, MD 02/11/15 1515

## 2015-02-14 ENCOUNTER — Encounter: Payer: Self-pay | Admitting: *Deleted

## 2015-02-14 DIAGNOSIS — J189 Pneumonia, unspecified organism: Secondary | ICD-10-CM | POA: Insufficient documentation

## 2015-02-14 NOTE — Assessment & Plan Note (Signed)
Improving. Continue doxycyline until finished. Mucinex plain OTC and repeat chest x-ray in 4 weeks. FU prn worsening/failure to improve.  Return precautions and when to seek emergency care verbally discussed and given on handout.

## 2015-03-06 ENCOUNTER — Encounter: Payer: Self-pay | Admitting: Nurse Practitioner

## 2015-03-06 ENCOUNTER — Telehealth: Payer: Self-pay | Admitting: Nurse Practitioner

## 2015-03-06 ENCOUNTER — Ambulatory Visit
Admission: RE | Admit: 2015-03-06 | Discharge: 2015-03-06 | Disposition: A | Discharge status: Home / Self Care | Payer: Managed Care, Other (non HMO) | Source: Ambulatory Visit | Attending: Nurse Practitioner | Admitting: Nurse Practitioner

## 2015-03-06 ENCOUNTER — Ambulatory Visit (INDEPENDENT_AMBULATORY_CARE_PROVIDER_SITE_OTHER): Payer: Managed Care, Other (non HMO) | Admitting: Nurse Practitioner

## 2015-03-06 VITALS — BP 112/70 | HR 80 | Temp 97.9°F | Wt 164.0 lb

## 2015-03-06 DIAGNOSIS — R05 Cough: Secondary | ICD-10-CM | POA: Diagnosis present

## 2015-03-06 DIAGNOSIS — R059 Cough, unspecified: Secondary | ICD-10-CM

## 2015-03-06 MED ORDER — PREDNISONE 10 MG PO TABS: ORAL_TABLET | ORAL | Status: DC

## 2015-03-06 NOTE — Assessment & Plan Note (Signed)
Pt continues to have intermittent symptoms. 3 weeks out from last chest x-ray. Will repeat today to follow to resolution as recommended by ED. Prednisone taper given for residual symptoms. Instructions given verbally and written on AVS. Continue use of Robitussin AC prn. FU prn worsening/failure to improve.

## 2015-03-06 NOTE — Telephone Encounter (Signed)
Spoke with pt about results of CXR. It showed resolution of CAP. She will continue prednisone taper for other symptoms and follow up with Dr. Lorin PicketScott in Feb at her scheduled appointment or sooner if needed.

## 2015-03-06 NOTE — Patient Instructions (Signed)
[facility] [address] [place], [place] [id number] Hours of Operation Monday - Friday, 8 a.m. - 5 p.m. Main: [phone]   Prednisone with breakfast or lunch at the latest.  6 tablets on day 1, 5 tablets on day 2, 4 tablets on day 3, 3 tablets on day 4, 2 tablets day 5, 1 tablet on day 6...done! Take tablets all together not spaced out Don't take with NSAIDs (Ibuprofen, Aleve, Naproxen, Meloxicam ect...)

## 2015-03-06 NOTE — Progress Notes (Signed)
Patient ID: Savannah NeighborsCynthia H Kadrmas, female    DOB: [birth year]  Age: 59 y.o. MRN: [id number]  CC: Hospitalization Follow-up   HPI Savannah Gomez presents for follow up from ER and incomplete resolution of symptoms.  1) Pt was seen in the ER 02/01/15 with Pneumonia, was treated with Doxycyline, saw me, went back to the ED on 02/11/15 with similar symptoms and was given a Z-pack. Improvement seen on X-ray.   Was given Robitussin-AC  3 weeks out from last Chest X-ray  Last day or two, felt the symptoms returning. Chest tightness, fatigue, coughing, and SOB. Sore throat, no drainage   Zyrtec daily   History Savannah Gomez has a past medical history of Asthma; Arthritis; GERD (gastroesophageal reflux disease); Allergy; Chicken pox; Migraine headache; and Urine incontinence.   She has past surgical history that includes Vaginal hysterectomy (1987); Nasal sinus surgery (1992); Rotator cuff repair (1994); Hand surgery (1999); Elbow surgery (1999); carpal tunnel sugery; and Trigger finger surgery (1999 and 2000).   Her family history includes Asthma in her mother; Breast cancer in her maternal grandmother; Colon cancer in her mother and sister; Diabetes in her maternal grandfather; Heart disease in her father; Hypertension in her father; Lung cancer in her maternal grandmother; Stroke in her maternal grandfather.She reports that she has never smoked. She has never used smokeless tobacco. She reports that she does not drink alcohol or use illicit drugs.  Outpatient Prescriptions Prior to Visit  Medication Sig Dispense Refill  . butalbital-acetaminophen-caffeine (FIORICET) 50-325-40 MG tablet Take 1-2 tablets by mouth every 6 (six) hours as needed for headache. 20 tablet 0  . calcium carbonate (CALCIUM 600) 600 MG TABS tablet Take 600 mg by mouth daily with breakfast.    . cetirizine (ZYRTEC) 10 MG tablet Take 10 mg by mouth daily.    Marland Kitchen. dexlansoprazole (DEXILANT) 60 MG capsule Take 60 mg by mouth daily.    Marland Kitchen.  diltiazem (CARDIZEM CD) 120 MG 24 hr capsule     . Docusate Calcium (STOOL SOFTENER PO) Take by mouth daily.    Marland Kitchen. estradiol (ESTRACE) 1 MG tablet TAKE 2 TABLETS BY MOUTH EVERY OTHER DAY AND 1 TABLET EVERY OTHER DAY 45 tablet 0  . guaiFENesin-codeine 100-10 MG/5ML syrup Take 10 mLs by mouth every 4 (four) hours as needed for cough. 180 mL 0  . imipramine (TOFRANIL) 25 MG tablet Take 25 mg by mouth 2 (two) times daily.    . metoprolol succinate (TOPROL-XL) 25 MG 24 hr tablet Take 0.5 tablets (12.5 mg total) by mouth daily. (Patient taking differently: Take 25 mg by mouth daily. ) 15 tablet 5  . mirabegron ER (MYRBETRIQ) 25 MG TB24 Take 25 mg by mouth 2 (two) times daily.    Marland Kitchen. topiramate (TOPAMAX) 50 MG tablet Take 50 mg by mouth 2 (two) times daily.    Marland Kitchen. azithromycin (ZITHROMAX Z-PAK) 250 MG tablet Take 2 tablets (500 mg) on  Day 1,  followed by 1 tablet (250 mg) once daily on Days 2 through 5. (Patient not taking: Reported on 03/06/2015) 6 each 0   No facility-administered medications prior to visit.    ROS Review of Systems  Constitutional: Positive for fatigue. Negative for fever, chills and diaphoresis.  HENT: Positive for postnasal drip and sore throat. Negative for congestion, ear discharge and ear pain.   Eyes: Negative for visual disturbance.  Respiratory: Positive for cough. Negative for chest tightness, shortness of breath and wheezing.   Cardiovascular: Negative for chest pain, palpitations and leg  swelling.  Gastrointestinal: Negative for nausea, vomiting and diarrhea.  Skin: Negative for rash.  Neurological: Positive for light-headedness. Negative for dizziness, weakness, numbness and headaches.  Psychiatric/Behavioral: The patient is not nervous/anxious.     Objective:  BP 112/70 mmHg  Pulse 80  Temp(Src) 97.9 F (36.6 C) (Oral)  Wt 164 lb (74.39 kg)  SpO2 99%  Physical Exam  Constitutional: She is oriented to person, place, and time. She appears well-developed and  well-nourished. No distress.  HENT:  Head: Normocephalic and atraumatic.  Right Ear: External ear normal.  Left Ear: External ear normal.  Mouth/Throat: No oropharyngeal exudate.  TM's clear bilaterally  Eyes: EOM are normal. Pupils are equal, round, and reactive to light. Right eye exhibits no discharge. Left eye exhibits no discharge. No scleral icterus.  Cardiovascular: Normal rate, regular rhythm and normal heart sounds.  Exam reveals no gallop and no friction rub.   No murmur heard. Pulmonary/Chest: Effort normal and breath sounds normal. No respiratory distress. She has no wheezes. She has no rales. She exhibits no tenderness.  Neurological: She is alert and oriented to person, place, and time. No cranial nerve deficit. She exhibits normal muscle tone. Coordination normal.  Skin: Skin is warm and dry. No rash noted. She is not diaphoretic.  Psychiatric: She has a normal mood and affect. Her behavior is normal. Judgment and thought content normal.   Assessment & Plan:   Savannah Gomez was seen today for hospitalization follow-up.  Diagnoses and all orders for this visit:  Cough -     DG Chest 2 View; Future  Other orders -     predniSONE (DELTASONE) 10 MG tablet; Take 6 tablets by mouth on day 1 with breakfast then decrease by 1 tablet each day until gone.   I have discontinued Ms. Delima's azithromycin. I am also having her start on predniSONE. Additionally, I am having her maintain her topiramate, dexlansoprazole, imipramine, mirabegron ER, diltiazem, cetirizine, calcium carbonate, Docusate Calcium (STOOL SOFTENER PO), estradiol, metoprolol succinate, butalbital-acetaminophen-caffeine, and guaiFENesin-codeine.  Meds ordered this encounter  Medications  . predniSONE (DELTASONE) 10 MG tablet    Sig: Take 6 tablets by mouth on day 1 with breakfast then decrease by 1 tablet each day until gone.    Dispense:  21 tablet    Refill:  0    Order Specific Question:  Supervising Provider     Answer:  Sherlene Shams [[id number]]     Follow-up: Return if symptoms worsen or fail to improve.

## 2015-03-26 ENCOUNTER — Other Ambulatory Visit: Payer: Self-pay | Admitting: Internal Medicine

## 2015-03-27 ENCOUNTER — Ambulatory Visit (INDEPENDENT_AMBULATORY_CARE_PROVIDER_SITE_OTHER): Payer: Managed Care, Other (non HMO) | Admitting: Nurse Practitioner

## 2015-03-27 VITALS — BP 124/82 | HR 86 | Temp 98.7°F | Ht 63.0 in | Wt 167.0 lb

## 2015-03-27 DIAGNOSIS — J189 Pneumonia, unspecified organism: Secondary | ICD-10-CM

## 2015-03-27 MED ORDER — GUAIFENESIN-CODEINE 100-10 MG/5ML PO SOLN: 10.0000 mL | ORAL | Status: DC | PRN

## 2015-03-27 MED ORDER — LEVOFLOXACIN 750 MG PO TABS: 750.0000 mg | ORAL_TABLET | Freq: Every day | ORAL | Status: DC

## 2015-03-27 MED ORDER — PREDNISONE 10 MG PO TABS: ORAL_TABLET | ORAL | Status: DC

## 2015-03-27 NOTE — Progress Notes (Signed)
Patient ID: Savannah Gomez, female    DOB: 1955-10-23  Age: 60 y.o. MRN: [id number]  CC: Sore Throat   HPI Savannah Gomez presents for follow up of cough that has worsened x 4 days She is accompanied by her husband again today  1) Started back on cough medicine ( Robitussin AC) and sudafed  Sore throat and cough. Denies fever  Saturday had chest heaviness Fatigue  Patient reports she feels the same as when she first had CAP  History Savannah Gomez has a past medical history of Asthma; Arthritis; GERD (gastroesophageal reflux disease); Allergy; Chicken pox; Migraine headache; and Urine incontinence.   She has past surgical history that includes Vaginal hysterectomy (1987); Nasal sinus surgery (1992); Rotator cuff repair (1994); Hand surgery (1999); Elbow surgery (1999); carpal tunnel sugery; and Trigger finger surgery (1999 and 2000).   Her family history includes Asthma in her mother; Breast cancer in her maternal grandmother; Colon cancer in her mother and sister; Diabetes in her maternal grandfather; Heart disease in her father; Hypertension in her father; Lung cancer in her maternal grandmother; Stroke in her maternal grandfather.She reports that she has never smoked. She has never used smokeless tobacco. She reports that she does not drink alcohol or use illicit drugs.  Outpatient Prescriptions Prior to Visit  Medication Sig Dispense Refill  . calcium carbonate (CALCIUM 600) 600 MG TABS tablet Take 600 mg by mouth daily with breakfast.    . cetirizine (ZYRTEC) 10 MG tablet Take 10 mg by mouth daily.    Marland Kitchen dexlansoprazole (DEXILANT) 60 MG capsule Take 60 mg by mouth daily.    Marland Kitchen diltiazem (CARDIZEM CD) 120 MG 24 hr capsule     . Docusate Calcium (STOOL SOFTENER PO) Take by mouth daily.    Marland Kitchen estradiol (ESTRACE) 1 MG tablet TAKE 2 TABLETS BY MOUTH EVERY OTHER DAY ALTERNATING WITH 1 TABLET EVERY OTHER DAY 45 tablet 1  . mirabegron ER (MYRBETRIQ) 25 MG TB24 Take 25 mg by mouth 2 (two) times  daily.    Marland Kitchen topiramate (TOPAMAX) 50 MG tablet Take 50 mg by mouth 2 (two) times daily.    Marland Kitchen guaiFENesin-codeine 100-10 MG/5ML syrup Take 10 mLs by mouth every 4 (four) hours as needed for cough. 180 mL 0  . butalbital-acetaminophen-caffeine (FIORICET) 50-325-40 MG tablet Take 1-2 tablets by mouth every 6 (six) hours as needed for headache. 20 tablet 0  . imipramine (TOFRANIL) 25 MG tablet Take 25 mg by mouth 2 (two) times daily.    . metoprolol succinate (TOPROL-XL) 25 MG 24 hr tablet Take 0.5 tablets (12.5 mg total) by mouth daily. (Patient taking differently: Take 25 mg by mouth daily. ) 15 tablet 5  . predniSONE (DELTASONE) 10 MG tablet Take 6 tablets by mouth on day 1 with breakfast then decrease by 1 tablet each day until gone. (Patient not taking: Reported on 03/27/2015) 21 tablet 0   No facility-administered medications prior to visit.    ROS Review of Systems  Constitutional: Positive for chills and fatigue. Negative for fever and diaphoresis.  HENT: Positive for congestion and sore throat. Negative for ear discharge, ear pain, postnasal drip and rhinorrhea.   Respiratory: Positive for cough and chest tightness. Negative for wheezing.   Gastrointestinal: Positive for nausea. Negative for vomiting and diarrhea.  Musculoskeletal: Negative for myalgias.  Skin: Negative for rash.  Neurological: Positive for headaches.    Objective:  BP 124/82 mmHg  Pulse 86  Temp(Src) 98.7 F (37.1 C)  Ht 5\' 3"  (  1.6 m)  Wt 167 lb (75.751 kg)  BMI 29.59 kg/m2  SpO2 99%  Physical Exam  Constitutional: She is oriented to person, place, and time. She appears well-developed and well-nourished. No distress.  HENT:  Head: Normocephalic and atraumatic.  Right Ear: External ear normal.  Left Ear: External ear normal.  Mouth/Throat: No oropharyngeal exudate.  Oropharynx and nasal mucosa slightly edematous  Eyes: EOM are normal. Pupils are equal, round, and reactive to light. Right eye exhibits no  discharge. Left eye exhibits no discharge. No scleral icterus.  Neck: Normal range of motion. Neck supple.  Cardiovascular: Normal rate and regular rhythm.   Pulmonary/Chest: Effort normal and breath sounds normal. No respiratory distress. She has no wheezes. She has no rales. She exhibits no tenderness.  Lymphadenopathy:    She has no cervical adenopathy.  Neurological: She is alert and oriented to person, place, and time. No cranial nerve deficit. She exhibits normal muscle tone. Coordination normal.  Skin: Skin is warm and dry. No rash noted. She is not diaphoretic.  Psychiatric: She has a normal mood and affect. Her behavior is normal. Judgment and thought content normal.   Assessment & Plan:   Savannah Gomez was seen today for sore throat.  Diagnoses and all orders for this visit:  CAP (community acquired pneumonia)  Other orders -     levofloxacin (LEVAQUIN) 750 MG tablet; Take 1 tablet (750 mg total) by mouth daily. -     guaiFENesin-codeine 100-10 MG/5ML syrup; Take 10 mLs by mouth every 4 (four) hours as needed for cough. -     predniSONE (DELTASONE) 10 MG tablet; Take 6 tablets by mouth on day 1 with breakfast then decrease by 1 tablet each day until gone.  I have discontinued Ms. Grandinetti's predniSONE. I am also having her start on levofloxacin and predniSONE. Additionally, I am having her maintain her topiramate, dexlansoprazole, imipramine, mirabegron ER, diltiazem, cetirizine, calcium carbonate, Docusate Calcium (STOOL SOFTENER PO), metoprolol succinate, butalbital-acetaminophen-caffeine, estradiol, and guaiFENesin-codeine.  Meds ordered this encounter  Medications  . levofloxacin (LEVAQUIN) 750 MG tablet    Sig: Take 1 tablet (750 mg total) by mouth daily.    Dispense:  5 tablet    Refill:  0    Order Specific Question:  Supervising Provider    Answer:  Darrick HuntsmanULLO, TERESA L [[id number]]  . guaiFENesin-codeine 100-10 MG/5ML syrup    Sig: Take 10 mLs by mouth every 4 (four) hours as  needed for cough.    Dispense:  180 mL    Refill:  0    Order Specific Question:  Supervising Provider    Answer:  Duncan DullULLO, TERESA L [[id number]]  . predniSONE (DELTASONE) 10 MG tablet    Sig: Take 6 tablets by mouth on day 1 with breakfast then decrease by 1 tablet each day until gone.    Dispense:  21 tablet    Refill:  0    Order Specific Question:  Supervising Provider    Answer:  Sherlene ShamsULLO, TERESA L [[id number]]     Follow-up: Return if symptoms worsen or fail to improve.

## 2015-03-27 NOTE — Patient Instructions (Signed)
Levaquin once daily for 5 days- daily probiotics for 3 weeks with this.  Prednisone with breakfast or lunch at the latest.  6 tablets on day 1, 5 tablets on day 2, 4 tablets on day 3, 3 tablets on day 4, 2 tablets day 5, 1 tablet on day 6...done! Take tablets all together not spaced out Don't take with NSAIDs (Ibuprofen, Aleve, Naproxen, Meloxicam ect...)  5 mL (1 teaspoon) of cough syrup. Don't drive or make important decisions while taking this....just sleep and rest.   Send me a Mychart or call if it comes back again after this round. We may need another Chest x-ray

## 2015-04-01 ENCOUNTER — Encounter: Payer: Self-pay | Admitting: Nurse Practitioner

## 2015-04-01 NOTE — Assessment & Plan Note (Signed)
Worsening again established problem Levaquin once daily for 5 days Encouraged probiotics Prednisone taper instructions Robitussin-AC with instructions (printed, signed, given to pt) Asked her to follow-up with me via my chart if little to no improvement we will get a chest x-ray Follow-up with Dr. Lorin PicketScott in February or sooner as needed

## 2015-05-07 ENCOUNTER — Encounter: Payer: BLUE CROSS/BLUE SHIELD | Admitting: Internal Medicine

## 2015-05-10 ENCOUNTER — Encounter: Payer: Self-pay | Admitting: Internal Medicine

## 2015-05-10 ENCOUNTER — Ambulatory Visit (INDEPENDENT_AMBULATORY_CARE_PROVIDER_SITE_OTHER): Payer: Managed Care, Other (non HMO) | Admitting: Internal Medicine

## 2015-05-10 VITALS — BP 102/62 | HR 84 | Temp 97.8°F | Resp 12 | Ht 62.0 in | Wt 169.1 lb

## 2015-05-10 DIAGNOSIS — R059 Cough, unspecified: Secondary | ICD-10-CM

## 2015-05-10 DIAGNOSIS — R928 Other abnormal and inconclusive findings on diagnostic imaging of breast: Secondary | ICD-10-CM

## 2015-05-10 DIAGNOSIS — R05 Cough: Secondary | ICD-10-CM

## 2015-05-10 DIAGNOSIS — Z1239 Encounter for other screening for malignant neoplasm of breast: Secondary | ICD-10-CM

## 2015-05-10 DIAGNOSIS — R5383 Other fatigue: Secondary | ICD-10-CM

## 2015-05-10 DIAGNOSIS — R0789 Other chest pain: Secondary | ICD-10-CM

## 2015-05-10 DIAGNOSIS — Z0001 Encounter for general adult medical examination with abnormal findings: Secondary | ICD-10-CM

## 2015-05-10 DIAGNOSIS — N3010 Interstitial cystitis (chronic) without hematuria: Secondary | ICD-10-CM

## 2015-05-10 DIAGNOSIS — K219 Gastro-esophageal reflux disease without esophagitis: Secondary | ICD-10-CM

## 2015-05-10 DIAGNOSIS — E7800 Pure hypercholesterolemia, unspecified: Secondary | ICD-10-CM

## 2015-05-10 DIAGNOSIS — Z0000 Encounter for general adult medical examination without abnormal findings: Secondary | ICD-10-CM

## 2015-05-10 DIAGNOSIS — Z800 Family history of malignant neoplasm of digestive organs: Secondary | ICD-10-CM

## 2015-05-10 NOTE — Progress Notes (Signed)
Pre visit review using our clinic review tool, if applicable. No additional management support is needed unless otherwise documented below in the visit note. 

## 2015-05-10 NOTE — Progress Notes (Signed)
Patient ID: Savannah Gomez, female   DOB: June 11, 1955, 60 y.o.   MRN: [id number]   Subjective:    Patient ID: Savannah Gomez, female    DOB: [birth year], 60 y.o.   MRN: [id number]  HPI  Patient with past history of GERD, allergies and hypercholesterolemia.  She comes in today to follow up on these issues as well as for a complete physical exam.  She reports decreased energy.  Reports noticing some increased fatigue with exertion.  Also reports noticing a lump in her throat.  Some cough related to this.  No chest congestion.  No wheezing.  She has a history of acid reflux.  Noticed worsening.  Had been on dexilant.  Insurance stopped covering.  Had worsening symptoms.  Back on dexilant now.  No abdominal pain.  Bowels stable.     Past Medical History  Diagnosis Date  . Asthma   . Arthritis   . GERD (gastroesophageal reflux disease)   . Allergy   . Chicken pox   . Migraine headache   . Urine incontinence    Past Surgical History  Procedure Laterality Date  . Vaginal hysterectomy  1987    secondary to fibroids  . Nasal sinus surgery  1992  . Rotator cuff repair  1994  . Hand surgery  1999    broken finger - pins  . Elbow surgery  1999  . Carpal tunnel sugery      bilateral  . Trigger finger surgery  1999 and 2000   Family History  Problem Relation Age of Onset  . Colon cancer Mother     colon  . Asthma Mother   . Heart disease Father   . Hypertension Father   . Colon cancer Sister   . Stroke Maternal Grandfather   . Lung cancer Maternal Grandmother   . Breast cancer Maternal Grandmother   . Diabetes Maternal Grandfather    Social History   Social History  . Marital Status: Married    Spouse Name: N/A  . Number of Children: N/A  . Years of Education: N/A   Social History Main Topics  . Smoking status: Never Smoker   . Smokeless tobacco: Never Used  . Alcohol Use: No  . Drug Use: No  . Sexual Activity: Not Asked   Other Topics Concern  . None   Social  History Narrative    Outpatient Encounter Prescriptions as of 05/10/2015  Medication Sig  . butalbital-acetaminophen-caffeine (FIORICET) 50-325-40 MG tablet Take 1-2 tablets by mouth every 6 (six) hours as needed for headache.  . calcium carbonate (CALCIUM 600) 600 MG TABS tablet Take 600 mg by mouth daily with breakfast.  . cetirizine (ZYRTEC) 10 MG tablet Take 10 mg by mouth daily.  Marland Kitchen dexlansoprazole (DEXILANT) 60 MG capsule Take 60 mg by mouth daily.  Marland Kitchen diltiazem (CARDIZEM CD) 120 MG 24 hr capsule   . Docusate Calcium (STOOL SOFTENER PO) Take by mouth daily.  Marland Kitchen estradiol (ESTRACE) 1 MG tablet TAKE 2 TABLETS BY MOUTH EVERY OTHER DAY ALTERNATING WITH 1 TABLET EVERY OTHER DAY  . imipramine (TOFRANIL) 25 MG tablet Take 25 mg by mouth 2 (two) times daily.  . metoprolol succinate (TOPROL-XL) 25 MG 24 hr tablet Take 0.5 tablets (12.5 mg total) by mouth daily. (Patient taking differently: Take 25 mg by mouth daily. )  . mirabegron ER (MYRBETRIQ) 25 MG TB24 Take 25 mg by mouth 2 (two) times daily.  Marland Kitchen topiramate (TOPAMAX) 50 MG tablet Take 50 mg by  mouth 2 (two) times daily.  . [DISCONTINUED] guaiFENesin-codeine 100-10 MG/5ML syrup Take 10 mLs by mouth every 4 (four) hours as needed for cough.  . [DISCONTINUED] levofloxacin (LEVAQUIN) 750 MG tablet Take 1 tablet (750 mg total) by mouth daily.  . [DISCONTINUED] predniSONE (DELTASONE) 10 MG tablet Take 6 tablets by mouth on day 1 with breakfast then decrease by 1 tablet each day until gone.   No facility-administered encounter medications on file as of 05/10/2015.    Review of Systems  Constitutional: Positive for fatigue. Negative for appetite change and unexpected weight change.  HENT: Negative for congestion and sinus pressure.   Eyes: Negative for pain and visual disturbance.  Respiratory: Positive for cough and shortness of breath. Negative for chest tightness and wheezing.   Cardiovascular: Negative for chest pain, palpitations and leg  swelling.  Gastrointestinal: Negative for nausea, vomiting, abdominal pain and diarrhea.  Genitourinary: Negative for dysuria and difficulty urinating.  Musculoskeletal: Negative for back pain and joint swelling.  Skin: Negative for color change and rash.  Neurological: Negative for dizziness, light-headedness and headaches.  Hematological: Negative for adenopathy. Does not bruise/bleed easily.  Psychiatric/Behavioral: Negative for dysphoric mood and agitation.       Objective:    Physical Exam  Constitutional: She is oriented to person, place, and time. She appears well-developed and well-nourished. No distress.  HENT:  Nose: Nose normal.  Mouth/Throat: Oropharynx is clear and moist.  Eyes: Right eye exhibits no discharge. Left eye exhibits no discharge. No scleral icterus.  Neck: Neck supple. No thyromegaly present.  Cardiovascular: Normal rate and regular rhythm.   Pulmonary/Chest: Breath sounds normal. No accessory muscle usage. No tachypnea. No respiratory distress. She has no decreased breath sounds. She has no wheezes. She has no rhonchi. Right breast exhibits no inverted nipple, no mass, no nipple discharge and no tenderness (no axillary adenopathy). Left breast exhibits no inverted nipple, no mass, no nipple discharge and no tenderness (no axilarry adenopathy).  Abdominal: Soft. Bowel sounds are normal. There is no tenderness.  Musculoskeletal: She exhibits no edema or tenderness.  Lymphadenopathy:    She has no cervical adenopathy.  Neurological: She is alert and oriented to person, place, and time.  Skin: Skin is warm. No rash noted. No erythema.  Psychiatric: She has a normal mood and affect. Her behavior is normal.    BP 102/62 mmHg  Pulse 84  Temp(Src) 97.8 F (36.6 C) (Oral)  Resp 12  Ht  (1.575 m)  Wt 169 lb 1.9 oz (76.712 kg)  BMI 30.92 kg/m2  SpO2 99% Wt Readings from Last 3 Encounters:  05/10/15 169 lb 1.9 oz (76.712 kg)  03/27/15 167 lb (75.751 kg)    03/06/15 164 lb (74.39 kg)     Lab Results  Component Value Date   WBC 4.4 02/11/2015   HGB 12.6 02/11/2015   HCT 36.2 02/11/2015   PLT 210 02/11/2015   GLUCOSE 113* 02/01/2015   CHOL 191 05/26/2014   TRIG 49.0 05/26/2014   HDL 86.60 05/26/2014   LDLCALC 95 05/26/2014   ALT 17 05/26/2014   AST 18 05/26/2014   NA 133* 02/01/2015   K 3.5 02/01/2015   CL 107 02/01/2015   CREATININE 1.00 02/01/2015   BUN 10 02/01/2015   CO2 24 02/01/2015   TSH 2.52 05/26/2014    Dg Chest 2 View  03/06/2015  CLINICAL DATA:  Cough, pneumonia. EXAM: CHEST  2 VIEW COMPARISON:  02/11/2015 FINDINGS: Previously seen right middle lobe infiltrate has  resolved. No focal airspace opacities or effusions. Heart is normal size. No acute bony abnormality. IMPRESSION: No active cardiopulmonary disease. Electronically Signed   By: Charlett Nose M.D.   On: 03/06/2015 11:53       Assessment & Plan:   Problem List Items Addressed This Visit    Abnormal mammogram    Schedule f/u mammogram.        Cough    Persistent cough and feeling as if has a lump in her throat.  Had been off dexilant.  Back on now.  Add zantac as directed.  F/u with GI.  cxr recently - clear.        Family history of colon cancer    Colonoscopy 12/13/13 as outlined.  Continue f/u with GI.       Fatigue    Reports increased fatigue.  Fatigue with exertion.  EKG - SR with no acute ischemic changes.  Given worsening and persistent symptoms, will have Dr Lady Gary evaluate with question of need for further cardiac w/up.        Relevant Orders   Ambulatory referral to Cardiology   TSH   Basic metabolic panel   GERD (gastroesophageal reflux disease)    Back on dexilant.  With cough now.  Had been off dexilant.  Symptoms worsened.  Feel cough and lump in throat may be related to acid reflux.  Continue dexilant in the morning.  Zantac in the evening.  F/u with Dr Markham Jordan.  Question of need for f/u EGD.       Health care maintenance     Physical today 05/11/15.  Schedule mammogram.  Colonoscopy 12/13/13 - internal hemorrhoids and diverticulosis.        Hypercholesteremia    Low cholesterol diet and exercise.  Follow lipid panel.        Relevant Orders   Lipid panel   Hepatic function panel   Interstitial cystitis    Followed by Dr Achilles Dunk.        Other Visit Diagnoses    Other chest pain    -  Primary    Relevant Orders    EKG 12-Lead (Completed)    Ambulatory referral to Cardiology    Breast cancer screening        Relevant Orders    MM DIGITAL SCREENING BILATERAL        Dale Frankfort, MD

## 2015-05-13 ENCOUNTER — Encounter: Payer: Self-pay | Admitting: Internal Medicine

## 2015-05-13 NOTE — Assessment & Plan Note (Signed)
Followed by Dr Cope.  

## 2015-05-13 NOTE — Assessment & Plan Note (Signed)
Low cholesterol diet and exercise.  Follow lipid panel.   

## 2015-05-13 NOTE — Assessment & Plan Note (Signed)
Physical today 05/11/15.  Schedule mammogram.  Colonoscopy 12/13/13 - internal hemorrhoids and diverticulosis.

## 2015-05-13 NOTE — Assessment & Plan Note (Signed)
Reports increased fatigue.  Fatigue with exertion.  EKG - SR with no acute ischemic changes.  Given worsening and persistent symptoms, will have Dr Lady Gary evaluate with question of need for further cardiac w/up.

## 2015-05-13 NOTE — Assessment & Plan Note (Signed)
Back on dexilant.  With cough now.  Had been off dexilant.  Symptoms worsened.  Feel cough and lump in throat may be related to acid reflux.  Continue dexilant in the morning.  Zantac in the evening.  F/u with Dr Markham Jordan.  Question of need for f/u EGD.

## 2015-05-13 NOTE — Assessment & Plan Note (Signed)
Persistent cough and feeling as if has a lump in her throat.  Had been off dexilant.  Back on now.  Add zantac as directed.  F/u with GI.  cxr recently - clear.

## 2015-05-13 NOTE — Assessment & Plan Note (Signed)
Colonoscopy 12/13/13 as outlined.  Continue f/u with GI.

## 2015-05-13 NOTE — Assessment & Plan Note (Signed)
Schedule f/u mammogram.   

## 2015-05-18 ENCOUNTER — Other Ambulatory Visit: Payer: Self-pay | Admitting: Internal Medicine

## 2015-05-22 ENCOUNTER — Telehealth: Payer: Self-pay | Admitting: *Deleted

## 2015-05-22 NOTE — Telephone Encounter (Signed)
Patient stated that she was to receive a Rx for Estradial,and have a mammogram scheduled, however she has not received either.  Please advise  Pt.contact  [phone]

## 2015-05-22 NOTE — Telephone Encounter (Signed)
The referral coordinators will be giving Savannah Gomez a call about her mammogram and the pharmacy states that the pt picked up her Rx for Estradial on 2/18, too early for a refill

## 2015-05-24 ENCOUNTER — Other Ambulatory Visit: Payer: Managed Care, Other (non HMO)

## 2015-05-24 ENCOUNTER — Encounter: Payer: Self-pay | Admitting: Internal Medicine

## 2015-06-06 ENCOUNTER — Other Ambulatory Visit (INDEPENDENT_AMBULATORY_CARE_PROVIDER_SITE_OTHER): Payer: Managed Care, Other (non HMO)

## 2015-06-06 ENCOUNTER — Encounter: Payer: Self-pay | Admitting: Internal Medicine

## 2015-06-06 DIAGNOSIS — R5383 Other fatigue: Secondary | ICD-10-CM

## 2015-06-06 DIAGNOSIS — E7800 Pure hypercholesterolemia, unspecified: Secondary | ICD-10-CM | POA: Diagnosis not present

## 2015-06-06 LAB — LIPID PANEL
Cholesterol: 177 mg/dL (ref 0–200)
HDL: 74.2 mg/dL (ref >39.00)
LDL CALC: 92 mg/dL (ref 0–99)
NONHDL: 103.1
Total CHOL/HDL Ratio: 2
Triglycerides: 54 mg/dL (ref 0.0–149.0)
VLDL: 10.8 mg/dL (ref 0.0–40.0)

## 2015-06-06 LAB — HEPATIC FUNCTION PANEL
ALBUMIN: 3.9 g/dL (ref 3.5–5.2)
ALT: 20 U/L (ref 0–35)
AST: 19 U/L (ref 0–37)
Alkaline Phosphatase: 89 U/L (ref 39–117)
Bilirubin, Direct: 0.1 mg/dL (ref 0.0–0.3)
Total Bilirubin: 0.4 mg/dL (ref 0.2–1.2)
Total Protein: 6.3 g/dL (ref 6.0–8.3)

## 2015-06-06 LAB — TSH: TSH: 2.63 u[IU]/mL (ref 0.35–4.50)

## 2015-06-06 LAB — BASIC METABOLIC PANEL
BUN: 16 mg/dL (ref 6–23)
CALCIUM: 9.1 mg/dL (ref 8.4–10.5)
CO2: 26 mEq/L (ref 19–32)
Chloride: 108 mEq/L (ref 96–112)
Creatinine, Ser: 0.97 mg/dL (ref 0.40–1.20)
GFR: 62.08 mL/min (ref >60.00)
Glucose, Bld: 93 mg/dL (ref 70–99)
POTASSIUM: 4.2 meq/L (ref 3.5–5.1)
SODIUM: 139 meq/L (ref 135–145)

## 2015-06-12 ENCOUNTER — Ambulatory Visit
Admission: RE | Admit: 2015-06-12 | Discharge: 2015-06-12 | Disposition: A | Discharge status: Home / Self Care | Payer: Managed Care, Other (non HMO) | Source: Ambulatory Visit | Attending: Internal Medicine | Admitting: Internal Medicine

## 2015-06-12 ENCOUNTER — Other Ambulatory Visit: Payer: Self-pay | Admitting: Internal Medicine

## 2015-06-12 DIAGNOSIS — Z1231 Encounter for screening mammogram for malignant neoplasm of breast: Secondary | ICD-10-CM | POA: Diagnosis present

## 2015-06-12 DIAGNOSIS — Z1239 Encounter for other screening for malignant neoplasm of breast: Secondary | ICD-10-CM

## 2015-07-24 ENCOUNTER — Encounter: Payer: Self-pay | Admitting: Family Medicine

## 2015-07-24 ENCOUNTER — Telehealth: Payer: Self-pay | Admitting: *Deleted

## 2015-07-24 ENCOUNTER — Ambulatory Visit (INDEPENDENT_AMBULATORY_CARE_PROVIDER_SITE_OTHER): Payer: Managed Care, Other (non HMO) | Admitting: Family Medicine

## 2015-07-24 VITALS — BP 124/82 | HR 64 | Temp 97.6°F | Ht 62.0 in | Wt 172.5 lb

## 2015-07-24 DIAGNOSIS — J069 Acute upper respiratory infection, unspecified: Secondary | ICD-10-CM | POA: Diagnosis not present

## 2015-07-24 LAB — POCT RAPID STREP A (OFFICE): RAPID STREP A SCREEN: NEGATIVE

## 2015-07-24 NOTE — Patient Instructions (Signed)
This is viral.  Continue supportive care/OTC meds.  Call me if you fail to improve.  Take care  Dr. Adriana Simasook   Upper Respiratory Infection, Adult Most upper respiratory infections (URIs) are a viral infection of the air passages leading to the lungs. A URI affects the nose, throat, and upper air passages. The most common type of URI is nasopharyngitis and is typically referred to as "the common cold." URIs run their course and usually go away on their own. Most of the time, a URI does not require medical attention, but sometimes a bacterial infection in the upper airways can follow a viral infection. This is called a secondary infection. Sinus and middle ear infections are common types of secondary upper respiratory infections. Bacterial pneumonia can also complicate a URI. A URI can worsen asthma and chronic obstructive pulmonary disease (COPD). Sometimes, these complications can require emergency medical care and may be life threatening.  CAUSES Almost all URIs are caused by viruses. A virus is a type of germ and can spread from one person to another.  RISKS FACTORS You may be at risk for a URI if:   You smoke.   You have chronic heart or lung disease.  You have a weakened defense (immune) system.   You are very young or very old.   You have nasal allergies or asthma.  You work in crowded or poorly ventilated areas.  You work in health care facilities or schools. SIGNS AND SYMPTOMS  Symptoms typically develop 2-3 days after you come in contact with a cold virus. Most viral URIs last 7-10 days. However, viral URIs from the influenza virus (flu virus) can last 14-18 days and are typically more severe. Symptoms may include:   Runny or stuffy (congested) nose.   Sneezing.   Cough.   Sore throat.   Headache.   Fatigue.   Fever.   Loss of appetite.   Pain in your forehead, behind your eyes, and over your cheekbones (sinus pain).  Muscle aches.  DIAGNOSIS    Your health care provider may diagnose a URI by:  Physical exam.  Tests to check that your symptoms are not due to another condition such as:  Strep throat.  Sinusitis.  Pneumonia.  Asthma. TREATMENT  A URI goes away on its own with time. It cannot be cured with medicines, but medicines may be prescribed or recommended to relieve symptoms. Medicines may help:  Reduce your fever.  Reduce your cough.  Relieve nasal congestion. HOME CARE INSTRUCTIONS   Take medicines only as directed by your health care provider.   Gargle warm saltwater or take cough drops to comfort your throat as directed by your health care provider.  Use a warm mist humidifier or inhale steam from a shower to increase air moisture. This may make it easier to breathe.  Drink enough fluid to keep your urine clear or pale yellow.   Eat soups and other clear broths and maintain good nutrition.   Rest as needed.   Return to work when your temperature has returned to normal or as your health care provider advises. You may need to stay home longer to avoid infecting others. You can also use a face mask and careful hand washing to prevent spread of the virus.  Increase the usage of your inhaler if you have asthma.   Do not use any tobacco products, including cigarettes, chewing tobacco, or electronic cigarettes. If you need help quitting, ask your health care provider. PREVENTION  The  best way to protect yourself from getting a cold is to practice good hygiene.   Avoid oral or hand contact with people with cold symptoms.   Wash your hands often if contact occurs.  There is no clear evidence that vitamin C, vitamin E, echinacea, or exercise reduces the chance of developing a cold. However, it is always recommended to get plenty of rest, exercise, and practice good nutrition.  SEEK MEDICAL CARE IF:   You are getting worse rather than better.   Your symptoms are not controlled by medicine.   You  have chills.  You have worsening shortness of breath.  You have brown or red mucus.  You have yellow or brown nasal discharge.  You have pain in your face, especially when you bend forward.  You have a fever.  You have swollen neck glands.  You have pain while swallowing.  You have white areas in the back of your throat. SEEK IMMEDIATE MEDICAL CARE IF:   You have severe or persistent:  Headache.  Ear pain.  Sinus pain.  Chest pain.  You have chronic lung disease and any of the following:  Wheezing.  Prolonged cough.  Coughing up blood.  A change in your usual mucus.  You have a stiff neck.  You have changes in your:  Vision.  Hearing.  Thinking.  Mood. MAKE SURE YOU:   Understand these instructions.  Will watch your condition.  Will get help right away if you are not doing well or get worse.   This information is not intended to replace advice given to you by your health care provider. Make sure you discuss any questions you have with your health care provider.   Document Released: 09/03/2000 Document Revised: 07/25/2014 Document Reviewed: 06/15/2013 Elsevier Interactive Patient Education [employer]

## 2015-07-24 NOTE — Assessment & Plan Note (Signed)
New acute problem. Rapid strep negative. Exam unremarkable. Viral in origin.  Advised OTC Sudafed and zyrtec. Follow up as needed.

## 2015-07-24 NOTE — Telephone Encounter (Signed)
Patient was seen in the today, when she arrived home, her nose started to bleed.  EMS was called to the home, the nose bleed did stop,they suggested she FYI her provider about the nose bleed. Patient currently feels fine. Please advise, if needed.

## 2015-07-24 NOTE — Progress Notes (Signed)
Pre visit review using our clinic review tool, if applicable. No additional management support is needed unless otherwise documented below in the visit note. 

## 2015-07-24 NOTE — Telephone Encounter (Signed)
Patient called and told Dr.Cook's suggestions.

## 2015-07-24 NOTE — Telephone Encounter (Signed)
Please advise thanks.

## 2015-07-24 NOTE — Telephone Encounter (Signed)
Advise saline twice daily (intranasal)

## 2015-07-24 NOTE — Progress Notes (Signed)
   Subjective:  Patient ID: Savannah NeighborsCynthia H Gomez, female    DOB: [birth year]  Age: 60 y.o. MRN: [id number]  CC: URI  HPI:  60 year old female presents with complaints of sore throat, congestion, cough.  Patient reports that she's had the above symptoms since Sunday. Most troublesome symptom is sore throat. She states that it is moderate in severity. She also reports associated headache congestion, chills, and nonproductive cough. No reports of purulent nasal discharge. No associated fever. No known exacerbating factors. She been taking over-the-counter Sudafed with no relief. No other complaints at this time.  Social Hx   Social History   Social History  . Marital Status: Married    Spouse Name: N/A  . Number of Children: N/A  . Years of Education: N/A   Social History Main Topics  . Smoking status: Never Smoker   . Smokeless tobacco: Never Used  . Alcohol Use: No  . Drug Use: No  . Sexual Activity: Not Asked   Other Topics Concern  . None   Social History Narrative   Review of Systems  Constitutional: Positive for chills. Negative for fever.  HENT: Positive for congestion.   Respiratory: Positive for cough.    Objective:  BP 124/82 mmHg  Pulse 64  Temp(Src) 97.6 F (36.4 C) (Oral)  Ht 5\' 2"  (1.575 m)  Wt 172 lb 8 oz (78.245 kg)  BMI 31.54 kg/m2  SpO2 99%  BP/Weight 07/24/2015 05/10/2015 03/27/2015  Systolic BP 124 102 124  Diastolic BP 82 62 82  Wt. (Lbs) 172.5 169.12 167  BMI 31.54 30.92 29.59   Physical Exam  Constitutional: She appears well-developed. No distress.  HENT:  Head: Normocephalic and atraumatic.  Oropharynx with mild erythema. No exudate.  Eyes: Conjunctivae are normal.  Neck: Neck supple.  Cardiovascular: Normal rate and regular rhythm.   Pulmonary/Chest: Effort normal and breath sounds normal. She has no wheezes. She has no rales.  Lymphadenopathy:    She has no cervical adenopathy.  Neurological: She is alert.  Vitals reviewed.  Lab Results    Component Value Date   WBC 4.4 02/11/2015   HGB 12.6 02/11/2015   HCT 36.2 02/11/2015   PLT 210 02/11/2015   GLUCOSE 93 06/06/2015   CHOL 177 06/06/2015   TRIG 54.0 06/06/2015   HDL 74.20 06/06/2015   LDLCALC 92 06/06/2015   ALT 20 06/06/2015   AST 19 06/06/2015   NA 139 06/06/2015   K 4.2 06/06/2015   CL 108 06/06/2015   CREATININE 0.97 06/06/2015   BUN 16 06/06/2015   CO2 26 06/06/2015   TSH 2.63 06/06/2015   Assessment & Plan:   Problem List Items Addressed This Visit    URI (upper respiratory infection) - Primary    New acute problem. Rapid strep negative. Exam unremarkable. Viral in origin.  Advised OTC Sudafed and zyrtec. Follow up as needed.      Relevant Orders   POCT rapid strep A (Completed)   Culture, Group A Strep     Follow-up: PRN  Everlene OtherJayce Haile Bosler DO [facility] [place]

## 2015-07-25 ENCOUNTER — Telehealth: Payer: Self-pay | Admitting: Internal Medicine

## 2015-07-25 NOTE — Telephone Encounter (Signed)
Please advise patient when results are back, thanks Dr. Adriana Simasook saw her for a acute visit. thanks

## 2015-07-25 NOTE — Telephone Encounter (Signed)
Pt called to check if her strep test result came in? Call pt @ [phone]. Thank you!

## 2015-07-25 NOTE — Telephone Encounter (Signed)
Per EPIC not resulted yet. Please advise. thanks

## 2015-07-25 NOTE — Telephone Encounter (Signed)
Patient was called and told that once results are in she would be called. The culture is saying "in process."

## 2015-07-25 NOTE — Telephone Encounter (Signed)
Patient requested results if they are back.

## 2015-07-25 NOTE — Telephone Encounter (Signed)
Culture results not back yet.  It appears she saw Dr Adriana Simasook on 07/24/15.  May take 2-3 days to result.  Does she need anything else?

## 2015-07-26 LAB — CULTURE, GROUP A STREP: ORGANISM ID, BACTERIA: NORMAL

## 2015-09-17 ENCOUNTER — Encounter: Payer: Self-pay | Admitting: Internal Medicine

## 2015-09-17 ENCOUNTER — Ambulatory Visit (INDEPENDENT_AMBULATORY_CARE_PROVIDER_SITE_OTHER): Payer: Managed Care, Other (non HMO) | Admitting: Internal Medicine

## 2015-09-17 VITALS — BP 110/76 | HR 77 | Temp 98.0°F | Resp 18 | Ht 62.0 in | Wt 174.0 lb

## 2015-09-17 DIAGNOSIS — R05 Cough: Secondary | ICD-10-CM

## 2015-09-17 DIAGNOSIS — R059 Cough, unspecified: Secondary | ICD-10-CM

## 2015-09-17 DIAGNOSIS — K219 Gastro-esophageal reflux disease without esophagitis: Secondary | ICD-10-CM

## 2015-09-17 DIAGNOSIS — N3010 Interstitial cystitis (chronic) without hematuria: Secondary | ICD-10-CM

## 2015-09-17 DIAGNOSIS — E7800 Pure hypercholesterolemia, unspecified: Secondary | ICD-10-CM | POA: Diagnosis not present

## 2015-09-17 NOTE — Progress Notes (Signed)
Pre-visit discussion using our clinic review tool. No additional management support is needed unless otherwise documented below in the visit note.  

## 2015-09-17 NOTE — Progress Notes (Signed)
Patient ID: Savannah Gomez, female   DOB: 04-14-1955, 60 y.o.   MRN: [id number]   Subjective:    Patient ID: Savannah Gomez, female    DOB: [birth year], 60 y.o.   MRN: [id number]  HPI  Patient here for a scheduled follow up.  States she is doing relatively well.  Handling stress. Main concern is that of persistent intermittent cough and feeling of congestion down in her throat.  Also has issues with singing.  This has been persistent.  Also reports some occasional sob with exertion.  She has been evaluated by cardiology.  States the sob is worse in certain weather.  Feels her acid reflux is controlled. No problems swallowing.  No chest pain.  No abdominal pain or cramping.  Bowels stable.    Past Medical History  Diagnosis Date  . Asthma   . Arthritis   . GERD (gastroesophageal reflux disease)   . Allergy   . Chicken pox   . Migraine headache   . Urine incontinence    Past Surgical History  Procedure Laterality Date  . Vaginal hysterectomy  1987    secondary to fibroids  . Nasal sinus surgery  1992  . Rotator cuff repair  1994  . Hand surgery  1999    broken finger - pins  . Elbow surgery  1999  . Carpal tunnel sugery      bilateral  . Trigger finger surgery  1999 and 2000  . Breast cyst aspiration Bilateral    Family History  Problem Relation Age of Onset  . Colon cancer Mother     colon  . Asthma Mother   . Heart disease Father   . Hypertension Father   . Colon cancer Sister   . Stroke Maternal Grandfather   . Lung cancer Maternal Grandmother   . Breast cancer Maternal Grandmother   . Diabetes Maternal Grandfather    Social History   Social History  . Marital Status: Married    Spouse Name: N/A  . Number of Children: N/A  . Years of Education: N/A   Social History Main Topics  . Smoking status: Never Smoker   . Smokeless tobacco: Never Used  . Alcohol Use: No  . Drug Use: No  . Sexual Activity: Not Asked   Other Topics Concern  . None   Social  History Narrative    Outpatient Encounter Prescriptions as of 09/17/2015  Medication Sig  . butalbital-acetaminophen-caffeine (FIORICET) 50-325-40 MG tablet Take 1-2 tablets by mouth every 6 (six) hours as needed for headache.  . calcium carbonate (CALCIUM 600) 600 MG TABS tablet Take 600 mg by mouth daily with breakfast.  . cetirizine (ZYRTEC) 10 MG tablet Take 10 mg by mouth daily.  Marland Kitchen dexlansoprazole (DEXILANT) 60 MG capsule Take 60 mg by mouth daily.  Marland Kitchen diltiazem (CARDIZEM CD) 120 MG 24 hr capsule   . Docusate Calcium (STOOL SOFTENER PO) Take by mouth daily.  Marland Kitchen estradiol (ESTRACE) 1 MG tablet TAKE 2 TABLETS BY MOUTH EVERY OTHER DAY ALTERNATING WITH 1 TABLET EVERY OTHER DAY  . imipramine (TOFRANIL) 25 MG tablet Take 25 mg by mouth 2 (two) times daily.  . metoprolol succinate (TOPROL-XL) 25 MG 24 hr tablet Take 0.5 tablets (12.5 mg total) by mouth daily. (Patient taking differently: Take 25 mg by mouth daily. )  . mirabegron ER (MYRBETRIQ) 25 MG TB24 Take 25 mg by mouth 2 (two) times daily.  Marland Kitchen topiramate (TOPAMAX) 50 MG tablet Take 50 mg by mouth 2 (  two) times daily.   No facility-administered encounter medications on file as of 09/17/2015.    Review of Systems  Constitutional: Negative for appetite change and unexpected weight change.  HENT: Negative for sinus pressure.        Throat congestion as outlined.    Respiratory: Positive for cough and shortness of breath. Negative for chest tightness.   Cardiovascular: Negative for chest pain, palpitations and leg swelling.  Gastrointestinal: Negative for nausea, vomiting, abdominal pain and diarrhea.  Genitourinary: Negative for dysuria and difficulty urinating.  Musculoskeletal: Negative for back pain and joint swelling.  Skin: Negative for color change and rash.  Neurological: Negative for dizziness, light-headedness and headaches.  Psychiatric/Behavioral: Negative for dysphoric mood and agitation.       Objective:    Physical Exam    Constitutional: She appears well-developed and well-nourished. No distress.  HENT:  Nose: Nose normal.  Mouth/Throat: Oropharynx is clear and moist.  Neck: Neck supple. No thyromegaly present.  Cardiovascular: Normal rate and regular rhythm.   Pulmonary/Chest: Breath sounds normal. No respiratory distress. She has no wheezes.  Abdominal: Soft. Bowel sounds are normal. There is no tenderness.  Musculoskeletal: She exhibits no edema or tenderness.  Lymphadenopathy:    She has no cervical adenopathy.  Skin: No rash noted. No erythema.  Psychiatric: She has a normal mood and affect. Her behavior is normal.    BP 110/76 mmHg  Pulse 77  Temp(Src) 98 F (36.7 C) (Oral)  Resp 18  Ht 5\' 2"  (1.575 m)  Wt 174 lb (78.926 kg)  BMI 31.82 kg/m2  SpO2 98% Wt Readings from Last 3 Encounters:  09/17/15 174 lb (78.926 kg)  07/24/15 172 lb 8 oz (78.245 kg)  05/10/15 169 lb 1.9 oz (76.712 kg)     Lab Results  Component Value Date   WBC 4.4 02/11/2015   HGB 12.6 02/11/2015   HCT 36.2 02/11/2015   PLT 210 02/11/2015   GLUCOSE 93 06/06/2015   CHOL 177 06/06/2015   TRIG 54.0 06/06/2015   HDL 74.20 06/06/2015   LDLCALC 92 06/06/2015   ALT 20 06/06/2015   AST 19 06/06/2015   NA 139 06/06/2015   K 4.2 06/06/2015   CL 108 06/06/2015   CREATININE 0.97 06/06/2015   BUN 16 06/06/2015   CO2 26 06/06/2015   TSH 2.63 06/06/2015    Mm Screening Breast Tomo Bilateral  06/12/2015  CLINICAL DATA:  Screening. EXAM: 2D DIGITAL SCREENING BILATERAL MAMMOGRAM WITH CAD AND ADJUNCT TOMO COMPARISON:  Previous exam(s). ACR Breast Density Category b: There are scattered areas of fibroglandular density. FINDINGS: There are no findings suspicious for malignancy. Images were processed with CAD. IMPRESSION: No mammographic evidence of malignancy. A result letter of this screening mammogram will be mailed directly to the patient. RECOMMENDATION: Screening mammogram in one year. (Code:SM-B-01Y) BI-RADS CATEGORY  1:  Negative. Electronically Signed   By: Beckie SaltsSteven  Reid M.D.   On: 06/12/2015 11:26       Assessment & Plan:   Problem List Items Addressed This Visit    Cough - Primary    Persistent cough, throat congestion and feeling of lump in her throat.  Will add zantac as outlined.  Has tried allergy treatment.  cxr recently - clear.  Refer to pulmonary.        Relevant Orders   Ambulatory referral to Pulmonology   GERD (gastroesophageal reflux disease)    On dexilant.  Sees Dr Markham JordanElliot.  Describes the throat congestion.  Will add zantac.  Refer to pulmonary.        Hypercholesteremia    Low cholesterol diet and exercise.  Follow lipid panel.        Interstitial cystitis    Followed by Dr Achilles Dunkope.  Stable.            Dale DurhamSCOTT, Jandi Swiger, MD

## 2015-09-17 NOTE — Patient Instructions (Signed)
Zantac (ranitidine) 150mg - take 30 minutes before evening meal.   

## 2015-09-20 ENCOUNTER — Encounter: Payer: Self-pay | Admitting: Internal Medicine

## 2015-09-20 NOTE — Assessment & Plan Note (Signed)
Followed by Dr Cope.  Stable.  

## 2015-09-20 NOTE — Assessment & Plan Note (Signed)
Persistent cough, throat congestion and feeling of lump in her throat.  Will add zantac as outlined.  Has tried allergy treatment.  cxr recently - clear.  Refer to pulmonary.

## 2015-09-20 NOTE — Assessment & Plan Note (Signed)
Low cholesterol diet and exercise.  Follow lipid panel.   

## 2015-09-20 NOTE — Assessment & Plan Note (Signed)
On dexilant.  Sees Dr Markham JordanElliot.  Describes the throat congestion.  Will add zantac.  Refer to pulmonary.

## 2015-10-09 ENCOUNTER — Ambulatory Visit: Payer: Managed Care, Other (non HMO) | Admitting: Internal Medicine

## 2015-10-17 ENCOUNTER — Ambulatory Visit (INDEPENDENT_AMBULATORY_CARE_PROVIDER_SITE_OTHER): Payer: Managed Care, Other (non HMO) | Admitting: Internal Medicine

## 2015-10-17 ENCOUNTER — Encounter: Payer: Self-pay | Admitting: Internal Medicine

## 2015-10-17 VITALS — BP 128/70 | HR 68 | Ht 62.0 in | Wt 175.0 lb

## 2015-10-17 DIAGNOSIS — R0600 Dyspnea, unspecified: Secondary | ICD-10-CM

## 2015-10-17 DIAGNOSIS — R05 Cough: Secondary | ICD-10-CM

## 2015-10-17 DIAGNOSIS — J4530 Mild persistent asthma, uncomplicated: Secondary | ICD-10-CM | POA: Diagnosis not present

## 2015-10-17 DIAGNOSIS — R059 Cough, unspecified: Secondary | ICD-10-CM

## 2015-10-17 DIAGNOSIS — J45909 Unspecified asthma, uncomplicated: Secondary | ICD-10-CM | POA: Insufficient documentation

## 2015-10-17 MED ORDER — FLUTICASONE FUROATE 100 MCG/ACT IN AEPB: 1.0000 | INHALATION_SPRAY | Freq: Every day | RESPIRATORY_TRACT | 0 refills | Status: AC

## 2015-10-17 MED ORDER — ALBUTEROL SULFATE HFA 108 (90 BASE) MCG/ACT IN AERS: 2.0000 | INHALATION_SPRAY | Freq: Four times a day (QID) | RESPIRATORY_TRACT | 2 refills | Status: DC | PRN

## 2015-10-17 NOTE — Progress Notes (Signed)
[facility] Pulmonary Medicine Consultation    Date: 10/17/2015  MRN# [id number] Savannah Gomez 04-18-1955  Referring Physician: Dr. Lorin Picket PMD - Dr. Benard Rink Savannah Gomez is a 60 y.o. old female seen in consultation for cough  CC:  Chief Complaint  Patient presents with  . pulmonary consult    per Dr. Lorin Picket for cough. pt states she was dx w/PNA 3 times during the winter months since the PNA she has a non prod cough & increased sob. she seen Dr. Meredeth Ide around 2012 & was dx w/asthma.    HPI:  60 year old female past medical history of tachyarrhythmias, heart palpitations, acid reflux disease, migraine headaches, seen in consultation for chronic cough 5 months. Patient states that she had 3 episodes of pneumonia last year, most recent in December through January, and since then has been having a nonproductive cough. Cough has gotten better, but over the last 2-3 months his level of. She was seen by Dr. Meredeth Ide in 2012 for shortness of breath and cough, had a methacholine challenge test, which was positive, she was diagnosed with asthma. She was started on Ventolin and Symbicort, but had improvement in her symptoms and did not end up taking those medications. Today she states she has a mild intermittent dry cough, no wheezing, but does have shortness of breath. Can walk about 50 yards or 2 flights of stairs before need to take a break. She was recently seen by her primary care physician who added Zantac to her cough regimen, however this is not providing any significant improvement. She does have seasonal allergies and has seen ENT and allergist in the past, currently on Zyrtec. She is on a beta blocker and a calcium channel blocker for her palpitations and tach arrhythmias. Line patient is a never smoker, no exotic pets at home.     PMHX:   Past Medical History:  Diagnosis Date  . Allergy   . Arthritis   . Asthma   . Chicken pox   . GERD (gastroesophageal reflux disease)   .  Migraine headache   . Urine incontinence    Surgical Hx:  Past Surgical History:  Procedure Laterality Date  . BREAST CYST ASPIRATION Bilateral   . carpal tunnel sugery     bilateral  . ELBOW SURGERY  1999  . HAND SURGERY  1999   broken finger - pins  . NASAL SINUS SURGERY  1992  . ROTATOR CUFF REPAIR  1994  . Trigger finger surgery  1999 and 2000  . VAGINAL HYSTERECTOMY  1987   secondary to fibroids   Family Hx:  Family History  Problem Relation Age of Onset  . Colon cancer Mother     colon  . Asthma Mother   . Heart disease Father   . Hypertension Father   . Colon cancer Sister   . Stroke Maternal Grandfather   . Diabetes Maternal Grandfather   . Lung cancer Maternal Grandmother   . Breast cancer Maternal Grandmother    Social Hx:   Social History  Substance Use Topics  . Smoking status: Never Smoker  . Smokeless tobacco: Never Used  . Alcohol use No   Medication:   Current Outpatient Rx  . Order #: [id number] Class: Print  . Order #: [id number] Class: Historical Med  . Order #: [id number] Class: Historical Med  . Order #: [id number] Class: Historical Med  . Order #: [id number] Class: Historical Med  . Order #: [id number] Class: Historical Med  . Order #: [id number] Class: Normal  . Order #:  [id number] Class: Historical Med  . Order #: [id number] Class: Normal  . Order #: [id number] Class: Historical Med  . Order #: [id number] Class: Historical Med  . Order #: [id number] Class: Normal  . Order #: [id number] Class: Sample      Allergies:  Demerol [meperidine]  Review of Systems  Constitutional: Negative for chills and fever.  Eyes: Negative for double vision.  Respiratory: Positive for cough and shortness of breath. Negative for sputum production and wheezing.   Gastrointestinal: Negative for nausea.  Genitourinary: Negative for dysuria.  Skin: Negative for rash.  Neurological: Negative for dizziness and headaches.  Endo/Heme/Allergies: Does not bruise/bleed easily.    Psychiatric/Behavioral: Negative for depression.     Physical Examination:   VS: BP 128/70 (BP Location: Left Arm, Cuff Size: Normal)   Pulse 68   Ht  (1.575 m)   Wt 175 lb (79.4 kg)   SpO2 100%   BMI 32.01 kg/m   General Appearance: No distress  Neuro:without focal findings, mental status, speech normal, alert and oriented, cranial nerves 2-12 intact, reflexes normal and symmetric, sensation grossly normal  HEENT: PERRLA, EOM intact, no ptosis, no other lesions noticed; Mallampati 2 Pulmonary: normal breath sounds., diaphragmatic excursion normal.No wheezing, No rales;   Sputum Production:  None CardiovascularNormal S1,S2.  No m/r/g.  Abdominal aorta pulsation normal.    Abdomen: Benign, Soft, non-tender, No masses, hepatosplenomegaly, No lymphadenopathy Renal:  No costovertebral tenderness  GU:  No performed at this time. Endoc: No evident thyromegaly, no signs of acromegaly or Cushing features Skin:   warm, no rashes, no ecchymosis  Extremities: normal, no cyanosis, clubbing, no edema, warm with normal capillary refill. Other findings: None   Labs results:   Rad results: (The following images and results were reviewed by Dr. Dema Severin on 10/17/2015). CXR 03/06/2015 CLINICAL DATA:  Cough, pneumonia.  EXAM: CHEST  2 VIEW  COMPARISON:  02/11/2015  FINDINGS: Previously seen right middle lobe infiltrate has resolved. No focal airspace opacities or effusions. Heart is normal size. No acute bony abnormality.  IMPRESSION: No active cardiopulmonary disease.   Assessment and Plan: Asthma Patient with known history of asthma. Had a positive methacholine challenge testing in 2012, roughly I suspect that she has a resurgence of asthma given the recent upper respiratory tract infections. Cough could be manifesting as an asthmatic symptom. Patient also known to have shortness of breath with dyspnea on exertion. Given her current symptoms, we'll give a two-week trial of  inhaled corticosteroid.  Plan: Arnuity 100 g, 1 puff daily, gargle and rinse after each use Avoid allergens as much as possible  Cough Multifactorial-asthma, postinfectious, recurrent infections.  I suspect that her asthma is researching, and cough may be an early manifestation. Multiple exposure to upper respiratory tract infection maybe causing resurgence of asthma. Other and inciting factors could be beta blocker usage for her tachyarrhythmia and heart palpitations however I believe this is very low on the differential.  Given that she has a known diagnosis of asthma with a positive methacholine challenge test, will rechallenge her with inhaled corticosteroid trial.  Plan: -Avoid allergens -Inhale corticosteroid trial for 2 weeks  Dyspnea Multifactorial-recurrent upper spray tract infection, heart palpitations, tachyarrhythmia, asthma, postinfectious cough, deconditioning  Plan: -Inhale corticosteroid trial 2 weeks -Pulmonary function testing and 6 minute walk test prior to follow-up visit   Updated Medication List Outpatient Encounter Prescriptions as of 10/17/2015  Medication Sig  . butalbital-acetaminophen-caffeine (FIORICET) 50-325-40 MG tablet Take 1-2 tablets by mouth every 6 (six) hours as needed for headache.  Marland Kitchen  calcium carbonate (CALCIUM 600) 600 MG TABS tablet Take 600 mg by mouth daily with breakfast.  . cetirizine (ZYRTEC) 10 MG tablet Take 10 mg by mouth daily.  Marland Kitchen dexlansoprazole (DEXILANT) 60 MG capsule Take 60 mg by mouth daily.  Marland Kitchen diltiazem (CARDIZEM CD) 120 MG 24 hr capsule   . Docusate Calcium (STOOL SOFTENER PO) Take by mouth daily.  Marland Kitchen estradiol (ESTRACE) 1 MG tablet TAKE 2 TABLETS BY MOUTH EVERY OTHER DAY ALTERNATING WITH 1 TABLET EVERY OTHER DAY  . imipramine (TOFRANIL) 25 MG tablet Take 25 mg by mouth 2 (two) times daily.  . metoprolol succinate (TOPROL-XL) 25 MG 24 hr tablet Take 0.5 tablets (12.5 mg total) by mouth daily. (Patient taking differently:  Take 25 mg by mouth daily. )  . mirabegron ER (MYRBETRIQ) 25 MG TB24 Take 25 mg by mouth 2 (two) times daily.  Marland Kitchen topiramate (TOPAMAX) 50 MG tablet Take 50 mg by mouth 2 (two) times daily.  Marland Kitchen albuterol (PROVENTIL HFA;VENTOLIN HFA) 108 (90 Base) MCG/ACT inhaler Inhale 2 puffs into the lungs every 6 (six) hours as needed for wheezing or shortness of breath.  . Fluticasone Furoate (ARNUITY ELLIPTA) 100 MCG/ACT AEPB Inhale 1 puff into the lungs daily.   No facility-administered encounter medications on file as of 10/17/2015.     Orders for this visit: Orders Placed This Encounter  Procedures  . Pulmonary function test    Standing Status:   Future    Standing Expiration Date:   10/16/2016    Order Specific Question:   Where should this test be performed?    Answer:   [facility]    Order Specific Question:   Full PFT: includes the following: basic spirometry, spirometry pre & post bronchodilator, diffusion capacity (DLCO), lung volumes    Answer:   Full PFT  . 6 minute walk    Standing Status:   Future    Standing Expiration Date:   10/16/2016    Order Specific Question:   Where should this test be performed?    Answer:   Other     Thank  you for the consultation and for allowing [facility] to assist in the care of your patient. Our recommendations are noted above.  Please contact us if we can be of further service.   Stephanie Acre, MD  Pulmonary and Critical Care Office Number: [phone]  Note: This note was prepared with Dragon dictation along with smaller phrase technology. Any transcriptional errors that result from this process are unintentional.

## 2015-10-17 NOTE — Assessment & Plan Note (Signed)
Multifactorial-asthma, postinfectious, recurrent infections.  I suspect that her asthma is researching, and cough may be an early manifestation. Multiple exposure to upper respiratory tract infection maybe causing resurgence of asthma. Other and inciting factors could be beta blocker usage for her tachyarrhythmia and heart palpitations however I believe this is very low on the differential.  Given that she has a known diagnosis of asthma with a positive methacholine challenge test, will rechallenge her with inhaled corticosteroid trial.  Plan: -Avoid allergens -Inhale corticosteroid trial for 2 weeks

## 2015-10-17 NOTE — Assessment & Plan Note (Signed)
Patient with known history of asthma. Had a positive methacholine challenge testing in 2012, roughly I suspect that she has a resurgence of asthma given the recent upper respiratory tract infections. Cough could be manifesting as an asthmatic symptom. Patient also known to have shortness of breath with dyspnea on exertion. Given her current symptoms, we'll give a two-week trial of inhaled corticosteroid.  Plan: Arnuity 100 g, 1 puff daily, gargle and rinse after each use Avoid allergens as much as possible

## 2015-10-17 NOTE — Assessment & Plan Note (Signed)
Multifactorial-recurrent upper spray tract infection, heart palpitations, tachyarrhythmia, asthma, postinfectious cough, deconditioning  Plan: -Inhale corticosteroid trial 2 weeks -Pulmonary function testing and 6 minute walk test prior to follow-up visit

## 2015-10-17 NOTE — Progress Notes (Signed)
Patient ID: Savannah Gomez, female   DOB: 11-01-1955, 60 y.o.   MRN: [id number] Patient seen in the office today and instructed on use of arnuity ellipta.  Patient expressed understanding and demonstrated technique.

## 2015-10-17 NOTE — Patient Instructions (Addendum)
Follow up with Dr. Dema Severin in:2 months - pfts and test - avoid allergens as much as possible.  - 2 week Arnuity - 1puff daily, -gargle and rinse after each use. If improvement in cough and SOB after 7-10 days, then call us back for rx.  - albuterol inhaler ([facility]) - 2puff every 3-4 hours as needed for shortness of breath\wheezing\recurrent cough

## 2015-10-24 ENCOUNTER — Telehealth: Payer: Self-pay | Admitting: Internal Medicine

## 2015-10-24 MED ORDER — FLUTICASONE FUROATE 100 MCG/ACT IN AEPB: 1.0000 | INHALATION_SPRAY | Freq: Every day | RESPIRATORY_TRACT | 5 refills | Status: DC

## 2015-10-24 NOTE — Telephone Encounter (Signed)
Arnuity 100 g  Pt is calling stating asking for [place] to send in a prescription for this medication  Please call in [facility] Please advise.

## 2015-10-24 NOTE — Telephone Encounter (Signed)
RX sent to pharmacy. Nothing further needed. 

## 2015-12-05 ENCOUNTER — Encounter: Payer: Self-pay | Admitting: Family

## 2015-12-05 ENCOUNTER — Ambulatory Visit (INDEPENDENT_AMBULATORY_CARE_PROVIDER_SITE_OTHER): Payer: Managed Care, Other (non HMO) | Admitting: Family

## 2015-12-05 DIAGNOSIS — M62838 Other muscle spasm: Secondary | ICD-10-CM | POA: Diagnosis not present

## 2015-12-05 DIAGNOSIS — J4530 Mild persistent asthma, uncomplicated: Secondary | ICD-10-CM

## 2015-12-05 DIAGNOSIS — Z8669 Personal history of other diseases of the nervous system and sense organs: Secondary | ICD-10-CM | POA: Diagnosis not present

## 2015-12-05 NOTE — Progress Notes (Signed)
Pre visit review using our clinic review tool, if applicable. No additional management support is needed unless otherwise documented below in the visit note. 

## 2015-12-05 NOTE — Progress Notes (Signed)
Subjective:    Patient ID: Savannah NeighborsCynthia H Speirs, female    DOB: 08-Dec-1955, 60 y.o.   MRN: [id number]  CC: Savannah NeighborsCynthia H Few is a 60 y.o. female who presents today for an acute visit.    HPI: Patient is here with acute complaint of headaches for 3 days. Woke up with headache, improved with Fioricet and topamax. Headache feels similar to prior HAs.  Endorses nausea, neck pain, fatigue, chills. No chills today. No fever. Neck pain is improving with heated rice pack. History of migraine headaches. No sinus congestion. No h/o cancer.   Asthma- on inhaler. Endorses some SOB and dry cough for months after PNA. Notes PNA 01/2015 and ever since hasn't felt well. Has been seen pulmonologist and diagnosed asthma. PFTs next week. No wheezing.   Denies exertional chest pain or pressure, numbness or tingling radiating to left arm or jaw, palpitations, dizziness, , changes in vision.       HISTORY:  Past Medical History:  Diagnosis Date  . Allergy   . Arthritis   . Asthma   . Chicken pox   . GERD (gastroesophageal reflux disease)   . Migraine headache   . Urine incontinence    Past Surgical History:  Procedure Laterality Date  . BREAST CYST ASPIRATION Bilateral   . carpal tunnel sugery     bilateral  . ELBOW SURGERY  1999  . HAND SURGERY  1999   broken finger - pins  . NASAL SINUS SURGERY  1992  . ROTATOR CUFF REPAIR  1994  . Trigger finger surgery  1999 and 2000  . VAGINAL HYSTERECTOMY  1987   secondary to fibroids   Family History  Problem Relation Age of Onset  . Colon cancer Mother     colon  . Asthma Mother   . Heart disease Father   . Hypertension Father   . Colon cancer Sister   . Stroke Maternal Grandfather   . Diabetes Maternal Grandfather   . Lung cancer Maternal Grandmother   . Breast cancer Maternal Grandmother     Allergies: Demerol [meperidine] Current Outpatient Prescriptions on File Prior to Visit  Medication Sig Dispense Refill  . albuterol (PROVENTIL  HFA;VENTOLIN HFA) 108 (90 Base) MCG/ACT inhaler Inhale 2 puffs into the lungs every 6 (six) hours as needed for wheezing or shortness of breath. 1 Inhaler 2  . butalbital-acetaminophen-caffeine (FIORICET) 50-325-40 MG tablet Take 1-2 tablets by mouth every 6 (six) hours as needed for headache. 20 tablet 0  . calcium carbonate (CALCIUM 600) 600 MG TABS tablet Take 600 mg by mouth daily with breakfast.    . cetirizine (ZYRTEC) 10 MG tablet Take 10 mg by mouth daily.    Marland Kitchen. dexlansoprazole (DEXILANT) 60 MG capsule Take 60 mg by mouth daily.    Marland Kitchen. diltiazem (CARDIZEM CD) 120 MG 24 hr capsule     . Docusate Calcium (STOOL SOFTENER PO) Take by mouth daily.    Marland Kitchen. estradiol (ESTRACE) 1 MG tablet TAKE 2 TABLETS BY MOUTH EVERY OTHER DAY ALTERNATING WITH 1 TABLET EVERY OTHER DAY 45 tablet 3  . Fluticasone Furoate (ARNUITY ELLIPTA) 100 MCG/ACT AEPB Inhale 1 puff into the lungs daily. 30 each 5  . imipramine (TOFRANIL) 25 MG tablet Take 25 mg by mouth 2 (two) times daily.    . metoprolol succinate (TOPROL-XL) 25 MG 24 hr tablet Take 0.5 tablets (12.5 mg total) by mouth daily. (Patient taking differently: Take 25 mg by mouth daily. ) 15 tablet 5  . mirabegron  ER (MYRBETRIQ) 25 MG TB24 Take 25 mg by mouth 2 (two) times daily.    Marland Kitchen topiramate (TOPAMAX) 50 MG tablet Take 50 mg by mouth 2 (two) times daily.     No current facility-administered medications on file prior to visit.     Social History  Substance Use Topics  . Smoking status: Never Smoker  . Smokeless tobacco: Never Used  . Alcohol use No    Review of Systems  Constitutional: Positive for chills. Negative for fever.  HENT: Negative for congestion and sore throat.   Respiratory: Positive for shortness of breath. Negative for cough and wheezing.   Cardiovascular: Negative for chest pain and palpitations.  Gastrointestinal: Negative for nausea and vomiting.  Musculoskeletal: Positive for neck pain.  Neurological: Positive for headaches.        Objective:    BP 110/66   Pulse 69   Temp 97.8 F (36.6 C) (Oral)   Ht 5\' 3"  (1.6 m)   Wt 175 lb 9.6 oz (79.7 kg)   SpO2 99%   BMI 31.11 kg/m    Physical Exam  Constitutional: She appears well-developed and well-nourished.  HENT:  Head: Normocephalic and atraumatic.  Right Ear: Hearing, tympanic membrane, external ear and ear canal normal. No drainage, swelling or tenderness. No foreign bodies. Tympanic membrane is not erythematous and not bulging. No middle ear effusion. No decreased hearing is noted.  Left Ear: Hearing, tympanic membrane, external ear and ear canal normal. No drainage, swelling or tenderness. No foreign bodies. Tympanic membrane is not erythematous and not bulging.  No middle ear effusion. No decreased hearing is noted.  Nose: Nose normal. No rhinorrhea. Right sinus exhibits no maxillary sinus tenderness and no frontal sinus tenderness. Left sinus exhibits no maxillary sinus tenderness and no frontal sinus tenderness.  Mouth/Throat: Uvula is midline, oropharynx is clear and moist and mucous membranes are normal. No oropharyngeal exudate, posterior oropharyngeal edema, posterior oropharyngeal erythema or tonsillar abscesses.  Eyes: Conjunctivae and EOM are normal. Pupils are equal, round, and reactive to light.  Fundus normal bilaterally.   Neck: Normal range of motion. Spinous process tenderness and muscular tenderness present. No neck rigidity. No erythema and normal range of motion present.    Point tenderness, spasm. No swelling or erythema. Slight discomfort with lateral ROM.   Cardiovascular: Normal rate, regular rhythm, normal heart sounds and normal pulses.   Pulmonary/Chest: Effort normal and breath sounds normal. She has no wheezes. She has no rhonchi. She has no rales.  Lymphadenopathy:       Head (right side): No submental, no submandibular, no tonsillar, no preauricular, no posterior auricular and no occipital adenopathy present.       Head (left side):  No submental, no submandibular, no tonsillar, no preauricular, no posterior auricular and no occipital adenopathy present.    She has no cervical adenopathy.  Neurological: She is alert. She has normal strength. No cranial nerve deficit or sensory deficit. She displays a negative Romberg sign.  Reflex Scores:      Bicep reflexes are 2+ on the right side and 2+ on the left side.      Patellar reflexes are 2+ on the right side and 2+ on the left side. Grip equal and strong bilateral upper extremities. Gait strong and steady. Able to perform rapid alternating movement without difficulty.   Skin: Skin is warm and dry.  Psychiatric: She has a normal mood and affect. Her speech is normal and behavior is normal. Thought content normal.  Vitals reviewed.      Assessment & Plan:   Problem List Items Addressed This Visit      Respiratory   Asthma    No wheezing or adventitious lung sounds to indicate exacerbation. No acute respiratory distress. Patient is afebrile. Patient follows with pulmonology and has pulmonary function testing for next week. Advise continued vigilance; return precautions given.        Other   History of migraine headaches    I'm reassured by normal neurologic and HEENT exam. Patient has a significant headache history. Headache responded to Topamax and Uristat and is resolving today. Patient states headache feels similar to headaches in the past and this is not the worst headache of her life. Patient politely declined any imaging today. Advise close vigilance. Return precautions given.      Muscle spasm    Suspect severity of headache triggered muscle spasm as point tenderness over trapezius. Patient and I jointly agreed on conservative management at home including heat, gentle stretching. She will let us know if this does not relieve symptoms and we will consider starting a muscle relaxant at that time.       Other Visit Diagnoses   None.        I am having Ms.  Madill maintain her topiramate, dexlansoprazole, imipramine, mirabegron ER, diltiazem, cetirizine, calcium carbonate, Docusate Calcium (STOOL SOFTENER PO), metoprolol succinate, butalbital-acetaminophen-caffeine, estradiol, albuterol, and Fluticasone Furoate.   No orders of the defined types were placed in this encounter.   Return precautions given.   Risks, benefits, and alternatives of the medications and treatment plan prescribed today were discussed, and patient expressed understanding.   Education regarding symptom management and diagnosis given to patient on AVS.  Continue to follow with Dale [place], MD for routine health maintenance.   Savannah Gomez and I agreed with plan.   Rennie Plowman, FNP

## 2015-12-05 NOTE — Assessment & Plan Note (Addendum)
No wheezing or adventitious lung sounds to indicate exacerbation. No acute respiratory distress. Patient is afebrile. Patient follows with pulmonology and has pulmonary function testing for next week. Advise continued vigilance; return precautions given.

## 2015-12-05 NOTE — Assessment & Plan Note (Signed)
Suspect severity of headache triggered muscle spasm as point tenderness over trapezius. Patient and I jointly agreed on conservative management at home including heat, gentle stretching. She will let us know if this does not relieve symptoms and we will consider starting a muscle relaxant at that time.

## 2015-12-05 NOTE — Patient Instructions (Signed)
Heat, gentle stretching as we talked about for your neck. Please let us know if headache or neck pain does not completely resolve, and most certainly if it worsens. My pleasure meeting you.

## 2015-12-05 NOTE — Assessment & Plan Note (Signed)
I'm reassured by normal neurologic and HEENT exam. Patient has a significant headache history. Headache responded to Topamax and Uristat and is resolving today. Patient states headache feels similar to headaches in the past and this is not the worst headache of her life. Patient politely declined any imaging today. Advise close vigilance. Return precautions given.

## 2015-12-11 ENCOUNTER — Ambulatory Visit (INDEPENDENT_AMBULATORY_CARE_PROVIDER_SITE_OTHER): Payer: Managed Care, Other (non HMO) | Admitting: *Deleted

## 2015-12-11 DIAGNOSIS — R05 Cough: Secondary | ICD-10-CM

## 2015-12-11 DIAGNOSIS — J4530 Mild persistent asthma, uncomplicated: Secondary | ICD-10-CM

## 2015-12-11 DIAGNOSIS — R059 Cough, unspecified: Secondary | ICD-10-CM

## 2015-12-11 LAB — PULMONARY FUNCTION TEST
DL/VA % pred: 70 %
DL/VA: 3.3 ml/min/mmHg/L
DLCO UNC: 32.61 ml/min/mmHg
DLCO unc % pred: 142 %
FEF 25-75 Post: 1.92 L/sec
FEF 25-75 Pre: 2.22 L/sec
FEF2575-%CHANGE-POST: -13 %
FEF2575-%PRED-POST: 86 %
FEF2575-%Pred-Pre: 99 %
FEV1-%CHANGE-POST: -2 %
FEV1-%PRED-POST: 89 %
FEV1-%PRED-PRE: 92 %
FEV1-POST: 2.17 L
FEV1-Pre: 2.24 L
FEV1FVC-%Change-Post: 6 %
FEV1FVC-%Pred-Pre: 101 %
FEV6-%CHANGE-POST: -8 %
FEV6-%PRED-POST: 84 %
FEV6-%PRED-PRE: 92 %
FEV6-PRE: 2.81 L
FEV6-Post: 2.56 L
FEV6FVC-%CHANGE-POST: 0 %
FEV6FVC-%PRED-PRE: 104 %
FEV6FVC-%Pred-Post: 104 %
FVC-%CHANGE-POST: -8 %
FVC-%PRED-POST: 81 %
FVC-%Pred-Pre: 89 %
FVC-Post: 2.56 L
FVC-Pre: 2.81 L
POST FEV1/FVC RATIO: 85 %
POST FEV6/FVC RATIO: 100 %
PRE FEV6/FVC RATIO: 100 %
Pre FEV1/FVC ratio: 80 %
RV % PRED: 106 %
RV: 2.11 L
TLC % pred: 102 %
TLC: 5.02 L

## 2015-12-11 NOTE — Progress Notes (Signed)
SMW performed today. 

## 2015-12-11 NOTE — Progress Notes (Signed)
PFT performed today. 

## 2015-12-19 ENCOUNTER — Ambulatory Visit (INDEPENDENT_AMBULATORY_CARE_PROVIDER_SITE_OTHER): Payer: Managed Care, Other (non HMO) | Admitting: Internal Medicine

## 2015-12-19 ENCOUNTER — Encounter: Payer: Self-pay | Admitting: Internal Medicine

## 2015-12-19 VITALS — BP 118/74 | HR 65 | Ht 62.5 in | Wt 174.4 lb

## 2015-12-19 DIAGNOSIS — J4530 Mild persistent asthma, uncomplicated: Secondary | ICD-10-CM

## 2015-12-19 NOTE — Assessment & Plan Note (Signed)
Asthma - mild persisent Cont with ICS - Arnuity  Still with some mild cough and sob, but attributes this to seasonal changes.  If cough and sob continues may need to add setup tx with ICS\LABA during the seasonal changes.

## 2015-12-19 NOTE — Progress Notes (Signed)
[facility]      MRN# [id number] Savannah Gomez 1955-11-07   CC: Chief Complaint  Patient presents with  . Follow-up    59mo rov. PFT/SMW results. pt states breathing has slightly improved. pt c/o non prd cough & sob with exertion.       Brief History: 60 yo with Hx of Asthma (positive MCH), stopped meds, and presented back to [place] with chronic cough, now on Arnuity with significant improvement.    Events since last clinic visit:  Patient presents today for follow-up visit of her asthma. At her last visit she was placed on a trial of inhaled corticosteroids, for which she states is significantly improve her symptoms of cough and shortness of breath. Today she does endorse some mild residual nonproductive cough, and some very mild shortness of breath. Patient also had primary function testing and 6 minute walk test done.   Current Outpatient Prescriptions:  .  albuterol (PROVENTIL HFA;VENTOLIN HFA) 108 (90 Base) MCG/ACT inhaler, Inhale 2 puffs into the lungs every 6 (six) hours as needed for wheezing or shortness of breath., Disp: 1 Inhaler, Rfl: 2 .  butalbital-acetaminophen-caffeine (FIORICET) 50-325-40 MG tablet, Take 1-2 tablets by mouth every 6 (six) hours as needed for headache., Disp: 20 tablet, Rfl: 0 .  calcium carbonate (CALCIUM 600) 600 MG TABS tablet, Take 600 mg by mouth daily with breakfast., Disp: , Rfl:  .  cetirizine (ZYRTEC) 10 MG tablet, Take 10 mg by mouth daily., Disp: , Rfl:  .  dexlansoprazole (DEXILANT) 60 MG capsule, Take 60 mg by mouth daily., Disp: , Rfl:  .  diltiazem (CARDIZEM CD) 120 MG 24 hr capsule, , Disp: , Rfl:  .  Docusate Calcium (STOOL SOFTENER PO), Take by mouth daily., Disp: , Rfl:  .  estradiol (ESTRACE) 1 MG tablet, TAKE 2 TABLETS BY MOUTH EVERY OTHER DAY ALTERNATING WITH 1 TABLET EVERY OTHER DAY, Disp: 45 tablet, Rfl: 3 .  Fluticasone Furoate (ARNUITY ELLIPTA) 100 MCG/ACT AEPB, Inhale 1 puff into the  lungs daily., Disp: 30 each, Rfl: 5 .  imipramine (TOFRANIL) 25 MG tablet, Take 25 mg by mouth 2 (two) times daily., Disp: , Rfl:  .  metoprolol succinate (TOPROL-XL) 25 MG 24 hr tablet, Take 0.5 tablets (12.5 mg total) by mouth daily. (Patient taking differently: Take 25 mg by mouth daily. ), Disp: 15 tablet, Rfl: 5 .  mirabegron ER (MYRBETRIQ) 25 MG TB24, Take 25 mg by mouth 2 (two) times daily., Disp: , Rfl:  .  topiramate (TOPAMAX) 50 MG tablet, Take 50 mg by mouth 2 (two) times daily., Disp: , Rfl:    Review of Systems  Constitutional: Negative for chills and fever.  Eyes: Negative for blurred vision and double vision.  Respiratory: Positive for cough and shortness of breath. Negative for hemoptysis, sputum production and wheezing.   Cardiovascular: Negative for chest pain.  Gastrointestinal: Negative for heartburn and nausea.  Genitourinary: Negative for dysuria.  Musculoskeletal: Negative for myalgias.  Skin: Negative for rash.  Neurological: Negative for dizziness and headaches.  Endo/Heme/Allergies: Does not bruise/bleed easily.  Psychiatric/Behavioral: Negative for depression.      Allergies:  Demerol [meperidine]  Physical Examination:  VS: BP 118/74 (BP Location: Left Arm, Cuff Size: Normal)   Pulse 65   Ht 5' 2.5" (1.588 m)   Wt 174 lb 6.4 oz (79.1 kg)   SpO2 100%   BMI 31.39 kg/m   General Appearance: No distress  HEENT: PERRLA, no ptosis, no other  lesions noticed Pulmonary:normal breath sounds., diaphragmatic excursion normal.No wheezing, No rales   Cardiovascular:  Normal S1,S2.  No m/r/g.     Abdomen:Exam: Benign, Soft, non-tender, No masses  Skin:   warm, no rashes, no ecchymosis  Extremities: normal, no cyanosis, clubbing, warm with normal capillary refill.    Pulmonary function test 12/11/2015 FEV1 92% FEV1/FVC 80% RV 106% TLC 102% DLCO corrected and her 42% Impression: Nonobstructive process by spirometry, no significant response to  bronchodilators. Mild scooping of end expiratory curve, consistent with a mild obstructive process. Overall fits the clinical diagnosis of asthma.  6 minute walk test total distance 1240 feet/378 m low saturation 99%, highest heart rate 78    Assessment and Plan: 60 year old female with history of asthma (diagnosed by [facility], Dr. Meredeth Ide). Seen for follow up visit  Asthma Asthma - mild persisent Cont with ICS - Arnuity  Still with some mild cough and sob, but attributes this to seasonal changes.  If cough and sob continues may need to add setup tx with ICS\LABA during the seasonal changes.    Updated Medication List Outpatient Encounter Prescriptions as of 12/19/2015  Medication Sig  . albuterol (PROVENTIL HFA;VENTOLIN HFA) 108 (90 Base) MCG/ACT inhaler Inhale 2 puffs into the lungs every 6 (six) hours as needed for wheezing or shortness of breath.  . butalbital-acetaminophen-caffeine (FIORICET) 50-325-40 MG tablet Take 1-2 tablets by mouth every 6 (six) hours as needed for headache.  . calcium carbonate (CALCIUM 600) 600 MG TABS tablet Take 600 mg by mouth daily with breakfast.  . cetirizine (ZYRTEC) 10 MG tablet Take 10 mg by mouth daily.  Marland Kitchen dexlansoprazole (DEXILANT) 60 MG capsule Take 60 mg by mouth daily.  Marland Kitchen diltiazem (CARDIZEM CD) 120 MG 24 hr capsule   . Docusate Calcium (STOOL SOFTENER PO) Take by mouth daily.  Marland Kitchen estradiol (ESTRACE) 1 MG tablet TAKE 2 TABLETS BY MOUTH EVERY OTHER DAY ALTERNATING WITH 1 TABLET EVERY OTHER DAY  . Fluticasone Furoate (ARNUITY ELLIPTA) 100 MCG/ACT AEPB Inhale 1 puff into the lungs daily.  Marland Kitchen imipramine (TOFRANIL) 25 MG tablet Take 25 mg by mouth 2 (two) times daily.  . metoprolol succinate (TOPROL-XL) 25 MG 24 hr tablet Take 0.5 tablets (12.5 mg total) by mouth daily. (Patient taking differently: Take 25 mg by mouth daily. )  . mirabegron ER (MYRBETRIQ) 25 MG TB24 Take 25 mg by mouth 2 (two) times daily.  Marland Kitchen topiramate (TOPAMAX) 50 MG tablet Take 50 mg by  mouth 2 (two) times daily.   No facility-administered encounter medications on file as of 12/19/2015.     Orders for this visit: No orders of the defined types were placed in this encounter.   Thank  you for the visitation and for allowing  [facility] to assist in the care of your patient. Our recommendations are noted above.  Please contact us if we can be of further service.  Stephanie Acre, MD [facility] Office Number: [phone]  Note: This note was prepared with Dragon dictation along with smaller phrase technology. Any transcriptional errors that result from this process are unintentional.

## 2015-12-19 NOTE — Patient Instructions (Addendum)
Follow up in months - cont with Arnuity, - allergy avoidance - please inform us if symptoms get worst as the seasons progress, we might have to add setup therapy with ICS/LABA - diet and exercise as tolerated.

## 2016-01-17 ENCOUNTER — Encounter: Payer: Self-pay | Admitting: Internal Medicine

## 2016-01-17 ENCOUNTER — Ambulatory Visit (INDEPENDENT_AMBULATORY_CARE_PROVIDER_SITE_OTHER): Payer: Managed Care, Other (non HMO) | Admitting: Internal Medicine

## 2016-01-17 DIAGNOSIS — N3010 Interstitial cystitis (chronic) without hematuria: Secondary | ICD-10-CM

## 2016-01-17 DIAGNOSIS — Z800 Family history of malignant neoplasm of digestive organs: Secondary | ICD-10-CM | POA: Diagnosis not present

## 2016-01-17 DIAGNOSIS — J4520 Mild intermittent asthma, uncomplicated: Secondary | ICD-10-CM

## 2016-01-17 DIAGNOSIS — K219 Gastro-esophageal reflux disease without esophagitis: Secondary | ICD-10-CM

## 2016-01-17 DIAGNOSIS — E7800 Pure hypercholesterolemia, unspecified: Secondary | ICD-10-CM | POA: Diagnosis not present

## 2016-01-17 NOTE — Progress Notes (Signed)
Patient ID: Savannah Gomez, female   DOB: 1955/06/17, 60 y.o.   MRN: [id number]   Subjective:    Patient ID: Savannah Gomez, female    DOB: 1955/12/21, 60 y.o.   MRN: [id number]  HPI  Patient here for a scheduled follow up.  States she is doing well.  Feels good.  Some increased stress with work, but doing well.  No chest pain.  No sob.  Breathing stable.  She just saw pulmonary.  Diagnosed with mild asthma.  Doing well on current regimen.     Past Medical History:  Diagnosis Date  . Allergy   . Arthritis   . Asthma   . Chicken pox   . GERD (gastroesophageal reflux disease)   . Migraine headache   . Urine incontinence    Past Surgical History:  Procedure Laterality Date  . BREAST CYST ASPIRATION Bilateral   . carpal tunnel sugery     bilateral  . ELBOW SURGERY  1999  . HAND SURGERY  1999   broken finger - pins  . NASAL SINUS SURGERY  1992  . ROTATOR CUFF REPAIR  1994  . Trigger finger surgery  1999 and 2000  . VAGINAL HYSTERECTOMY  1987   secondary to fibroids   Family History  Problem Relation Age of Onset  . Colon cancer Mother     colon  . Asthma Mother   . Heart disease Father   . Hypertension Father   . Colon cancer Sister   . Stroke Maternal Grandfather   . Diabetes Maternal Grandfather   . Lung cancer Maternal Grandmother   . Breast cancer Maternal Grandmother    Social History   Social History  . Marital status: Married    Spouse name: N/A  . Number of children: N/A  . Years of education: N/A   Social History Main Topics  . Smoking status: Never Smoker  . Smokeless tobacco: Never Used  . Alcohol use No  . Drug use: No  . Sexual activity: Not Asked   Other Topics Concern  . None   Social History Narrative  . None    Outpatient Encounter Prescriptions as of 01/17/2016  Medication Sig  . albuterol (PROVENTIL HFA;VENTOLIN HFA) 108 (90 Base) MCG/ACT inhaler Inhale 2 puffs into the lungs every 6 (six) hours as needed for wheezing or  shortness of breath.  . butalbital-acetaminophen-caffeine (FIORICET) 50-325-40 MG tablet Take 1-2 tablets by mouth every 6 (six) hours as needed for headache.  . calcium carbonate (CALCIUM 600) 600 MG TABS tablet Take 600 mg by mouth daily with breakfast.  . cetirizine (ZYRTEC) 10 MG tablet Take 10 mg by mouth daily.  Marland Kitchen dexlansoprazole (DEXILANT) 60 MG capsule Take 60 mg by mouth daily.  Marland Kitchen diltiazem (CARDIZEM CD) 120 MG 24 hr capsule   . Docusate Calcium (STOOL SOFTENER PO) Take by mouth daily.  Marland Kitchen estradiol (ESTRACE) 1 MG tablet TAKE 2 TABLETS BY MOUTH EVERY OTHER DAY ALTERNATING WITH 1 TABLET EVERY OTHER DAY  . Fluticasone Furoate (ARNUITY ELLIPTA) 100 MCG/ACT AEPB Inhale 1 puff into the lungs daily.  Marland Kitchen imipramine (TOFRANIL) 25 MG tablet Take 25 mg by mouth 2 (two) times daily.  . metoprolol succinate (TOPROL-XL) 25 MG 24 hr tablet Take 0.5 tablets (12.5 mg total) by mouth daily. (Patient taking differently: Take 25 mg by mouth daily. )  . mirabegron ER (MYRBETRIQ) 25 MG TB24 Take 25 mg by mouth 2 (two) times daily.  Marland Kitchen topiramate (TOPAMAX) 50 MG tablet Take  50 mg by mouth 2 (two) times daily.   No facility-administered encounter medications on file as of 01/17/2016.     Review of Systems  Constitutional: Negative for appetite change and unexpected weight change.  HENT: Negative for congestion and sinus pressure.   Respiratory: Negative for cough, chest tightness and shortness of breath.   Cardiovascular: Negative for chest pain, palpitations and leg swelling.  Gastrointestinal: Negative for abdominal pain, diarrhea, nausea and vomiting.  Genitourinary: Negative for difficulty urinating and dysuria.  Musculoskeletal: Negative for back pain and joint swelling.  Skin: Negative for color change and rash.  Neurological: Negative for dizziness, light-headedness and headaches.  Psychiatric/Behavioral: Negative for agitation and dysphoric mood.       Objective:    Physical Exam    Constitutional: She appears well-developed and well-nourished. No distress.  HENT:  Nose: Nose normal.  Mouth/Throat: Oropharynx is clear and moist.  Neck: Neck supple. No thyromegaly present.  Cardiovascular: Normal rate and regular rhythm.   Pulmonary/Chest: Breath sounds normal. No respiratory distress. She has no wheezes.  Abdominal: Soft. Bowel sounds are normal. There is no tenderness.  Musculoskeletal: She exhibits no edema or tenderness.  Lymphadenopathy:    She has no cervical adenopathy.  Skin: No rash noted. No erythema.  Psychiatric: She has a normal mood and affect. Her behavior is normal.    BP 100/70   Pulse 70   Temp 97.6 F (36.4 C) (Oral)   Ht 5\' 3"  (1.6 m)   Wt 176 lb 3.2 oz (79.9 kg)   SpO2 98%   BMI 31.21 kg/m  Wt Readings from Last 3 Encounters:  01/17/16 176 lb 3.2 oz (79.9 kg)  12/19/15 174 lb 6.4 oz (79.1 kg)  12/05/15 175 lb 9.6 oz (79.7 kg)     Lab Results  Component Value Date   WBC 4.4 02/11/2015   HGB 12.6 02/11/2015   HCT 36.2 02/11/2015   PLT 210 02/11/2015   GLUCOSE 93 06/06/2015   CHOL 177 06/06/2015   TRIG 54.0 06/06/2015   HDL 74.20 06/06/2015   LDLCALC 92 06/06/2015   ALT 20 06/06/2015   AST 19 06/06/2015   NA 139 06/06/2015   K 4.2 06/06/2015   CL 108 06/06/2015   CREATININE 0.97 06/06/2015   BUN 16 06/06/2015   CO2 26 06/06/2015   TSH 2.63 06/06/2015    Mm Screening Breast Tomo Bilateral  Result Date: 06/12/2015 CLINICAL DATA:  Screening. EXAM: 2D DIGITAL SCREENING BILATERAL MAMMOGRAM WITH CAD AND ADJUNCT TOMO COMPARISON:  Previous exam(s). ACR Breast Density Category b: There are scattered areas of fibroglandular density. FINDINGS: There are no findings suspicious for malignancy. Images were processed with CAD. IMPRESSION: No mammographic evidence of malignancy. A result letter of this screening mammogram will be mailed directly to the patient. RECOMMENDATION: Screening mammogram in one year. (Code:SM-B-01Y) BI-RADS  CATEGORY  1: Negative. Electronically Signed   By: Beckie SaltsSteven  Reid M.D.   On: 06/12/2015 11:26       Assessment & Plan:   Problem List Items Addressed This Visit    Asthma    Seeing pulmonary.  Continue current inhaler regimen.  Overall feels better.  Follow.        Family history of colon cancer    Colonoscopy 12/13/13 as outlined.  Continues f/u with GI.       GERD (gastroesophageal reflux disease)    On dexilant.  Followed by GI.  Doing well.       Hypercholesteremia    Low  cholesterol diet and exercise.  Follow lipid panel.        Interstitial cystitis    Followed by Dr Achilles Dunk.  Stable.         Other Visit Diagnoses   None.      Dale Grafton, MD

## 2016-01-17 NOTE — Progress Notes (Signed)
Pre visit review using our clinic review tool, if applicable. No additional management support is needed unless otherwise documented below in the visit note. 

## 2016-01-19 ENCOUNTER — Encounter: Payer: Self-pay | Admitting: Internal Medicine

## 2016-01-19 NOTE — Assessment & Plan Note (Signed)
Followed by Dr Cope.  Stable.  

## 2016-01-19 NOTE — Assessment & Plan Note (Signed)
On dexilant.  Followed by GI.  Doing well.

## 2016-01-19 NOTE — Assessment & Plan Note (Signed)
Colonoscopy 12/13/13 as outlined.  Continues f/u with GI.

## 2016-01-19 NOTE — Assessment & Plan Note (Signed)
Seeing pulmonary.  Continue current inhaler regimen.  Overall feels better.  Follow.

## 2016-01-19 NOTE — Assessment & Plan Note (Signed)
Low cholesterol diet and exercise.  Follow lipid panel.   

## 2016-01-21 ENCOUNTER — Other Ambulatory Visit: Payer: Self-pay | Admitting: Internal Medicine

## 2016-02-26 ENCOUNTER — Other Ambulatory Visit: Payer: Self-pay | Admitting: Nurse Practitioner

## 2016-04-07 ENCOUNTER — Ambulatory Visit (INDEPENDENT_AMBULATORY_CARE_PROVIDER_SITE_OTHER): Payer: Managed Care, Other (non HMO) | Admitting: Family

## 2016-04-07 ENCOUNTER — Ambulatory Visit (INDEPENDENT_AMBULATORY_CARE_PROVIDER_SITE_OTHER): Payer: Managed Care, Other (non HMO)

## 2016-04-07 ENCOUNTER — Encounter: Payer: Self-pay | Admitting: Family

## 2016-04-07 VITALS — BP 128/76 | HR 83 | Temp 98.2°F | Ht 63.0 in | Wt 175.6 lb

## 2016-04-07 DIAGNOSIS — J209 Acute bronchitis, unspecified: Secondary | ICD-10-CM

## 2016-04-07 MED ORDER — BENZONATATE 100 MG PO CAPS: 100.0000 mg | ORAL_CAPSULE | Freq: Two times a day (BID) | ORAL | 0 refills | Status: DC | PRN

## 2016-04-07 NOTE — Progress Notes (Signed)
Subjective:    Patient ID: Savannah Gomez, female    DOB: 1955-08-11, 61 y.o.   MRN: [id number]  CC: Savannah Gomez is a 61 y.o. female who presents today for an acute visit.    HPI: CC: cough, sore throat x one week, waxing and waning. Endorses fatigue, chills, SOB. No congestion. Tried sudafed with no relief. HA 'has eased up'. No fever, wheezing.   H/o pna  H/o palpitations, follows with cardiology and takes toprol, cardizem. Denies exertional chest pain or pressure, numbness or tingling radiating to left arm or jaw,  dizziness, frequent headaches, changes in vision, or shortness of breath.      9/17 seen pulmonology for asthma, mild persistent on ICS- Arnuity. May need ICS/LABA during seasonal allergies     HISTORY:  Past Medical History:  Diagnosis Date  . Allergy   . Arthritis   . Asthma   . Chicken pox   . GERD (gastroesophageal reflux disease)   . Migraine headache   . Urine incontinence    Past Surgical History:  Procedure Laterality Date  . BREAST CYST ASPIRATION Bilateral   . carpal tunnel sugery     bilateral  . ELBOW SURGERY  1999  . HAND SURGERY  1999   broken finger - pins  . NASAL SINUS SURGERY  1992  . ROTATOR CUFF REPAIR  1994  . Trigger finger surgery  1999 and 2000  . VAGINAL HYSTERECTOMY  1987   secondary to fibroids   Family History  Problem Relation Age of Onset  . Colon cancer Mother     colon  . Asthma Mother   . Heart disease Father   . Hypertension Father   . Colon cancer Sister   . Stroke Maternal Grandfather   . Diabetes Maternal Grandfather   . Lung cancer Maternal Grandmother   . Breast cancer Maternal Grandmother     Allergies: Demerol [meperidine] Current Outpatient Prescriptions on File Prior to Visit  Medication Sig Dispense Refill  . albuterol (PROVENTIL HFA;VENTOLIN HFA) 108 (90 Base) MCG/ACT inhaler Inhale 2 puffs into the lungs every 6 (six) hours as needed for wheezing or shortness of breath. 1 Inhaler 2    . calcium carbonate (CALCIUM 600) 600 MG TABS tablet Take 600 mg by mouth daily with breakfast.    . cetirizine (ZYRTEC) 10 MG tablet Take 10 mg by mouth daily.    Marland Kitchen DEXILANT 60 MG capsule TAKE ONE CAPSULE DAILY 30 capsule 5  . diltiazem (CARDIZEM CD) 120 MG 24 hr capsule     . Docusate Calcium (STOOL SOFTENER PO) Take by mouth daily.    Marland Kitchen estradiol (ESTRACE) 1 MG tablet TAKE TWO TABLETS BY MOUTH EVERY OTHER DAY ALTERNATING WITH ONE TABLETEVERY OTHER DAY 45 tablet 2  . Fluticasone Furoate (ARNUITY ELLIPTA) 100 MCG/ACT AEPB Inhale 1 puff into the lungs daily. 30 each 5  . imipramine (TOFRANIL) 25 MG tablet Take 25 mg by mouth 2 (two) times daily.    . metoprolol succinate (TOPROL-XL) 25 MG 24 hr tablet Take 0.5 tablets (12.5 mg total) by mouth daily. (Patient taking differently: Take 25 mg by mouth daily. ) 15 tablet 5  . mirabegron ER (MYRBETRIQ) 25 MG TB24 Take 25 mg by mouth 2 (two) times daily.    Marland Kitchen topiramate (TOPAMAX) 50 MG tablet Take 50 mg by mouth 2 (two) times daily.     No current facility-administered medications on file prior to visit.     Social History  Substance  Use Topics  . Smoking status: Never Smoker  . Smokeless tobacco: Never Used  . Alcohol use No    Review of Systems  Constitutional: Positive for fatigue. Negative for chills and fever.  HENT: Negative for congestion.   Respiratory: Positive for cough.   Cardiovascular: Negative for chest pain and palpitations.  Gastrointestinal: Negative for nausea and vomiting.  Neurological: Positive for headaches.      Objective:    BP 128/76   Pulse 83   Temp 98.2 F (36.8 C) (Oral)   Ht 5\' 3"  (1.6 m)   Wt 175 lb 9.6 oz (79.7 kg)   SpO2 99%   BMI 31.11 kg/m    Physical Exam  Constitutional: She appears well-developed and well-nourished.  HENT:  Head: Normocephalic and atraumatic.  Right Ear: Hearing, tympanic membrane, external ear and ear canal normal. No drainage, swelling or tenderness. No foreign  bodies. Tympanic membrane is not erythematous and not bulging. No middle ear effusion. No decreased hearing is noted.  Left Ear: Hearing, tympanic membrane, external ear and ear canal normal. No drainage, swelling or tenderness. No foreign bodies. Tympanic membrane is not erythematous and not bulging.  No middle ear effusion. No decreased hearing is noted.  Nose: Nose normal. No rhinorrhea. Right sinus exhibits no maxillary sinus tenderness and no frontal sinus tenderness. Left sinus exhibits no maxillary sinus tenderness and no frontal sinus tenderness.  Mouth/Throat: Uvula is midline, oropharynx is clear and moist and mucous membranes are normal. No oropharyngeal exudate, posterior oropharyngeal edema, posterior oropharyngeal erythema or tonsillar abscesses.  Eyes: Conjunctivae are normal.  Cardiovascular: Regular rhythm, normal heart sounds and normal pulses.   Pulmonary/Chest: Effort normal and breath sounds normal. She has no wheezes. She has no rhonchi. She has no rales.  Lymphadenopathy:       Head (right side): No submental, no submandibular, no tonsillar, no preauricular, no posterior auricular and no occipital adenopathy present.       Head (left side): No submental, no submandibular, no tonsillar, no preauricular, no posterior auricular and no occipital adenopathy present.    She has no cervical adenopathy.  Neurological: She is alert.  Skin: Skin is warm and dry.  Psychiatric: She has a normal mood and affect. Her speech is normal and behavior is normal. Thought content normal.  Vitals reviewed.      Assessment & Plan:  1. Acute bronchitis, unspecified organism Sao 299%. Patient is afebrile. No adventitious lung sounds. Pending chest x-ray to ensure no pneumonia as patient had pneumonia last year. Patient and I jointly agreed on conservative management in the absence of pneumonia. Advised Mucinex. Advised patient to stop Sudafed especially in history of heart palpitations.  - route.  benzonatate (TESSALON) 100 MG capsule; Take 1 capsule (100 mg total) by mouth 2 (two) times daily as needed for cough.  Dispense: 20 capsule; Refill: 0 - DG Chest 2 View       I am having Savannah Gomez maintain her topiramate, imipramine, mirabegron ER, diltiazem, cetirizine, calcium carbonate, Docusate Calcium (STOOL SOFTENER PO), metoprolol succinate, albuterol, Fluticasone Furoate, estradiol, and DEXILANT.   No orders of the defined types were placed in this encounter.   Return precautions given.   Risks, benefits, and alternatives of the medications and treatment plan prescribed today were discussed, and patient expressed understanding.   Education regarding symptom management and diagnosis given to patient on AVS.  Continue to follow with Dale DurhamSCOTT, CHARLENE, MD for routine health maintenance.   Wende Neighborsynthia H Rensch and I  agreed with plan.   Mable Paris, FNP

## 2016-04-07 NOTE — Patient Instructions (Signed)
Chest xray  Use albuterol every 6 hours for first 24 hours to get good medication into the lungs and loosen congestion; after, you may use as needed and eventually stop all together when cough resolves.  Increase intake of clear fluids. Congestion is best treated by hydration, when mucus is wetter, it is thinner, less sticky, and easier to expel from the body, either through coughing up drainage, or by blowing your nose.   Get plenty of rest.   Use saline nasal drops and blow your nose frequently. Run a humidifier at night and elevate the head of the bed. Vicks Vapor rub will help with congestion and cough. Steam showers and sinus massage for congestion.   Use Acetaminophen or Ibuprofen as needed for fever or pain. Avoid second hand smoke. Even the smallest exposure will worsen symptoms.   Over the counter medications you can try include Delsym for cough, a decongestant for congestion, and Mucinex or Robitussin as an expectorant. Be sure to just get the plain Mucinex or Robitussin that just has one medication (Guaifenesen). We don't recommend the combination products. Note, be sure to drink two glasses of water with each dose of Mucinex as the medication will not work well without adequate hydration.   You can also try a teaspoon of honey to see if this will help reduce cough. Throat lozenges can sometimes be beneficial as well.    This illness will typically last 7 - 10 days.   Please follow up with our clinic if you develop a fever greater than 101 F, symptoms worsen, or do not resolve in the next week.

## 2016-04-07 NOTE — Progress Notes (Signed)
Pre visit review using our clinic review tool, if applicable. No additional management support is needed unless otherwise documented below in the visit note. 

## 2016-05-13 ENCOUNTER — Encounter: Payer: Managed Care, Other (non HMO) | Admitting: Internal Medicine

## 2016-05-19 ENCOUNTER — Encounter: Payer: Managed Care, Other (non HMO) | Admitting: Internal Medicine

## 2016-05-27 ENCOUNTER — Telehealth: Payer: Self-pay

## 2016-05-27 NOTE — Telephone Encounter (Signed)
PA completed on Covermymeds. thanks 

## 2016-05-28 NOTE — Telephone Encounter (Signed)
PA approved from 05/27/2016-11/23/2016

## 2016-07-17 ENCOUNTER — Ambulatory Visit (INDEPENDENT_AMBULATORY_CARE_PROVIDER_SITE_OTHER): Payer: Managed Care, Other (non HMO) | Admitting: Internal Medicine

## 2016-07-17 ENCOUNTER — Encounter: Payer: Self-pay | Admitting: Internal Medicine

## 2016-07-17 VITALS — BP 108/80 | HR 77 | Resp 16 | Ht 63.0 in | Wt 179.0 lb

## 2016-07-17 DIAGNOSIS — J4520 Mild intermittent asthma, uncomplicated: Secondary | ICD-10-CM

## 2016-07-17 MED ORDER — FLUTICASONE FUROATE 100 MCG/ACT IN AEPB: 1.0000 | INHALATION_SPRAY | Freq: Every day | RESPIRATORY_TRACT | 0 refills | Status: DC

## 2016-07-17 NOTE — Addendum Note (Signed)
Addended by: Alease Frame on: 07/17/2016 09:54 AM   Modules accepted: Orders

## 2016-07-17 NOTE — Progress Notes (Signed)
[facility] Pulmonary Medicine Consultation      MRN# [id number] Savannah Gomez 10-12-1955   CC: Chief Complaint  Patient presents with  . Follow-up  . Asthma      Brief History: 61 yo with Hx of Asthma (positive MCH), stopped meds, and presented back to [place] with chronic cough, now on Arnuity with significant improvement.  Dx with ASTHMA 15 years ago Pneumonia 2 years ago Triggers-pollen, cats,dogs  Events since last clinic visit: Doing well overall, no SOB, no cough and no wheezing Well controlled at this time Last albuterol use was 3 months ago No signs of infection   Current Outpatient Prescriptions:  .  albuterol (PROVENTIL HFA;VENTOLIN HFA) 108 (90 Base) MCG/ACT inhaler, Inhale 2 puffs into the lungs every 6 (six) hours as needed for wheezing or shortness of breath., Disp: 1 Inhaler, Rfl: 2 .  calcium carbonate (CALCIUM 600) 600 MG TABS tablet, Take 600 mg by mouth daily with breakfast., Disp: , Rfl:  .  cetirizine (ZYRTEC) 10 MG tablet, Take 10 mg by mouth daily., Disp: , Rfl:  .  DEXILANT 60 MG capsule, TAKE ONE CAPSULE DAILY, Disp: 30 capsule, Rfl: 5 .  diltiazem (CARDIZEM CD) 120 MG 24 hr capsule, , Disp: , Rfl:  .  Docusate Calcium (STOOL SOFTENER PO), Take by mouth daily., Disp: , Rfl:  .  estradiol (ESTRACE) 1 MG tablet, TAKE TWO TABLETS BY MOUTH EVERY OTHER DAY ALTERNATING WITH ONE TABLETEVERY OTHER DAY, Disp: 45 tablet, Rfl: 2 .  Fluticasone Furoate (ARNUITY ELLIPTA) 100 MCG/ACT AEPB, Inhale 1 puff into the lungs daily., Disp: 30 each, Rfl: 5 .  imipramine (TOFRANIL) 25 MG tablet, Take 25 mg by mouth 2 (two) times daily., Disp: , Rfl:  .  metoprolol succinate (TOPROL-XL) 25 MG 24 hr tablet, Take 0.5 tablets (12.5 mg total) by mouth daily. (Patient taking differently: Take 25 mg by mouth daily. ), Disp: 15 tablet, Rfl: 5 .  mirabegron ER (MYRBETRIQ) 25 MG TB24, Take 25 mg by mouth 2 (two) times daily., Disp: , Rfl:  .  topiramate (TOPAMAX) 50 MG tablet,  Take 50 mg by mouth 2 (two) times daily., Disp: , Rfl:    Review of Systems  Constitutional: Negative for chills and fever.  HENT: Negative for congestion.   Eyes: Negative for blurred vision and double vision.  Respiratory: Negative for cough, hemoptysis, sputum production, shortness of breath and wheezing.   Cardiovascular: Negative for chest pain.  Gastrointestinal: Negative for heartburn and nausea.  Skin: Negative for rash.      Allergies:  Demerol [meperidine]   BP 108/80 (BP Location: Left Arm, Patient Position: Sitting, Cuff Size: Normal)   Pulse 77   Resp 16   Ht  (1.6 m)   Wt 179 lb (81.2 kg)   SpO2 100%   BMI 31.71 kg/m   Physical Examination:  VS: Resp 16   Ht  (1.6 m)   Wt 179 lb (81.2 kg)   BMI 31.71 kg/m   General Appearance: No distress  HEENT: PERRLA, no ptosis, no other lesions noticed Pulmonary:normal breath sounds., diaphragmatic excursion normal.No wheezing, No rales   Cardiovascular:  Normal S1,S2.  No m/r/g.      Pulmonary function test 12/11/2015 FEV1 92% FEV1/FVC 80% RV 106% TLC 102% DLCO corrected and her 42% Impression: Nonobstructive process by spirometry, no significant response to bronchodilators. Mild scooping of end expiratory curve, consistent with a mild obstructive process. Overall fits the clinical diagnosis of asthma.  6  minute walk test total distance 1240 feet/378 m low saturation 99%, highest heart rate 78    Assessment and Plan:  61 year old female with mild intermittent well controlled ASTHMA  With well controlled allergic rhinitis Asthma Asthma - mild intermittent Cont with ICS - Arnuity 100 daily -avoid triggers -continue Zyrtec  Follow up in 6 months  Patient are satisfied with Plan of action and management. All questions answered  [name], M.D.  [facility]  Medical Director [facility] Medical Director [facility] Cardio-Pulmonary Department

## 2016-07-17 NOTE — Patient Instructions (Signed)
continue inhalers as prescribed 

## 2016-08-19 ENCOUNTER — Ambulatory Visit (INDEPENDENT_AMBULATORY_CARE_PROVIDER_SITE_OTHER): Payer: BLUE CROSS/BLUE SHIELD | Admitting: Internal Medicine

## 2016-08-19 ENCOUNTER — Encounter: Payer: Self-pay | Admitting: Internal Medicine

## 2016-08-19 VITALS — BP 110/80 | HR 79 | Temp 97.5°F | Ht 63.0 in | Wt 179.2 lb

## 2016-08-19 DIAGNOSIS — E2839 Other primary ovarian failure: Secondary | ICD-10-CM | POA: Diagnosis not present

## 2016-08-19 DIAGNOSIS — Z1231 Encounter for screening mammogram for malignant neoplasm of breast: Secondary | ICD-10-CM

## 2016-08-19 DIAGNOSIS — E7800 Pure hypercholesterolemia, unspecified: Secondary | ICD-10-CM

## 2016-08-19 DIAGNOSIS — K219 Gastro-esophageal reflux disease without esophagitis: Secondary | ICD-10-CM

## 2016-08-19 DIAGNOSIS — Z1239 Encounter for other screening for malignant neoplasm of breast: Secondary | ICD-10-CM

## 2016-08-19 DIAGNOSIS — J4520 Mild intermittent asthma, uncomplicated: Secondary | ICD-10-CM | POA: Diagnosis not present

## 2016-08-19 DIAGNOSIS — Z23 Encounter for immunization: Secondary | ICD-10-CM

## 2016-08-19 DIAGNOSIS — Z0000 Encounter for general adult medical examination without abnormal findings: Secondary | ICD-10-CM | POA: Diagnosis not present

## 2016-08-19 DIAGNOSIS — N3010 Interstitial cystitis (chronic) without hematuria: Secondary | ICD-10-CM

## 2016-08-19 MED ORDER — DEXLANSOPRAZOLE 60 MG PO CPDR: 1.0000 | DELAYED_RELEASE_CAPSULE | Freq: Every day | ORAL | 5 refills | Status: DC

## 2016-08-19 NOTE — Progress Notes (Signed)
Patient ID: VIVIANA TRIMBLE, female   DOB: Jan 18, 1956, 61 y.o.   MRN: [id number]   Subjective:    Patient ID: SOLEIL MAS, female    DOB: Nov 14, 1955, 61 y.o.   MRN: [id number]  HPI  Patient here for her physical exam.  She reports she is doing well.  Retired last month.  Doing well.  Decreased stress.  No chest pain.  Breathing stable.  Saw Dr Belia Heman 07/17/16.  No changes made.  No cough or congestion.  No acid reflux.  No abdominal pain.  Swallowing ok.  Bowels stable.  Off estrogen.  She weaned herself off.  Has been off for 3 weeks.  Some hot flashes.  Overall doing ok.     Past Medical History:  Diagnosis Date  . Allergy   . Arthritis   . Asthma   . Chicken pox   . GERD (gastroesophageal reflux disease)   . Migraine headache   . Urine incontinence    Past Surgical History:  Procedure Laterality Date  . BREAST CYST ASPIRATION Bilateral   . carpal tunnel sugery     bilateral  . ELBOW SURGERY  1999  . HAND SURGERY  1999   broken finger - pins  . NASAL SINUS SURGERY  1992  . ROTATOR CUFF REPAIR  1994  . Trigger finger surgery  1999 and 2000  . VAGINAL HYSTERECTOMY  1987   secondary to fibroids   Family History  Problem Relation Age of Onset  . Colon cancer Mother        colon  . Asthma Mother   . Heart disease Father   . Hypertension Father   . Colon cancer Sister   . Stroke Maternal Grandfather   . Diabetes Maternal Grandfather   . Lung cancer Maternal Grandmother   . Breast cancer Maternal Grandmother    Social History   Social History  . Marital status: Married    Spouse name: N/A  . Number of children: N/A  . Years of education: N/A   Social History Main Topics  . Smoking status: Never Smoker  . Smokeless tobacco: Never Used  . Alcohol use No  . Drug use: No  . Sexual activity: Not Asked   Other Topics Concern  . None   Social History Narrative  . None    Outpatient Encounter Prescriptions as of 08/19/2016  Medication Sig  . albuterol  (PROVENTIL HFA;VENTOLIN HFA) 108 (90 Base) MCG/ACT inhaler Inhale 2 puffs into the lungs every 6 (six) hours as needed for wheezing or shortness of breath.  . calcium carbonate (CALCIUM 600) 600 MG TABS tablet Take 600 mg by mouth daily with breakfast.  . cetirizine (ZYRTEC) 10 MG tablet Take 10 mg by mouth daily.  Marland Kitchen dexlansoprazole (DEXILANT) 60 MG capsule Take 1 capsule (60 mg total) by mouth daily.  Marland Kitchen diltiazem (CARDIZEM CD) 120 MG 24 hr capsule   . Docusate Calcium (STOOL SOFTENER PO) Take by mouth daily.  . Fluticasone Furoate (ARNUITY ELLIPTA) 100 MCG/ACT AEPB Inhale 1 puff into the lungs daily.  . Fluticasone Furoate (ARNUITY ELLIPTA) 100 MCG/ACT AEPB Inhale 1 puff into the lungs daily.  Marland Kitchen imipramine (TOFRANIL) 25 MG tablet Take 25 mg by mouth 2 (two) times daily.  . metoprolol succinate (TOPROL-XL) 25 MG 24 hr tablet Take 0.5 tablets (12.5 mg total) by mouth daily. (Patient taking differently: Take 25 mg by mouth daily. )  . mirabegron ER (MYRBETRIQ) 25 MG TB24 Take 25 mg by mouth 2 (two)  times daily.  Marland Kitchen topiramate (TOPAMAX) 50 MG tablet Take 50 mg by mouth 2 (two) times daily.  . [DISCONTINUED] DEXILANT 60 MG capsule TAKE ONE CAPSULE DAILY  . [DISCONTINUED] estradiol (ESTRACE) 1 MG tablet TAKE TWO TABLETS BY MOUTH EVERY OTHER DAY ALTERNATING WITH ONE TABLETEVERY OTHER DAY (Patient not taking: Reported on 08/19/2016)   No facility-administered encounter medications on file as of 08/19/2016.     Review of Systems  Constitutional: Negative for appetite change and unexpected weight change.  HENT: Negative for congestion and sinus pressure.   Eyes: Negative for pain and visual disturbance.  Respiratory: Negative for cough, chest tightness and shortness of breath.   Cardiovascular: Negative for chest pain, palpitations and leg swelling.  Gastrointestinal: Negative for abdominal pain, diarrhea, nausea and vomiting.  Genitourinary: Negative for difficulty urinating and dysuria.    Musculoskeletal: Negative for back pain and joint swelling.  Skin: Negative for color change and rash.  Neurological: Negative for dizziness, light-headedness and headaches.  Hematological: Negative for adenopathy. Does not bruise/bleed easily.  Psychiatric/Behavioral: Negative for agitation and dysphoric mood.       Objective:    Physical Exam  Constitutional: She is oriented to person, place, and time. She appears well-developed and well-nourished. No distress.  HENT:  Nose: Nose normal.  Mouth/Throat: Oropharynx is clear and moist.  Eyes: Right eye exhibits no discharge. Left eye exhibits no discharge. No scleral icterus.  Neck: Neck supple. No thyromegaly present.  Cardiovascular: Normal rate and regular rhythm.   Pulmonary/Chest: Breath sounds normal. No accessory muscle usage. No tachypnea. No respiratory distress. She has no decreased breath sounds. She has no wheezes. She has no rhonchi. Right breast exhibits no inverted nipple, no mass, no nipple discharge and no tenderness (no axillary adenopathy). Left breast exhibits no inverted nipple, no mass, no nipple discharge and no tenderness (no axilarry adenopathy).  Abdominal: Soft. Bowel sounds are normal. There is no tenderness.  Musculoskeletal: She exhibits no edema or tenderness.  Lymphadenopathy:    She has no cervical adenopathy.  Neurological: She is alert and oriented to person, place, and time.  Skin: Skin is warm. No rash noted. No erythema.  Psychiatric: She has a normal mood and affect. Her behavior is normal.    BP 110/80 (BP Location: Left Arm, Patient Position: Sitting, Cuff Size: Normal)   Pulse 79   Temp 97.5 F (36.4 C) (Oral)   Ht 5\' 3"  (1.6 m)   Wt 179 lb 3.2 oz (81.3 kg)   SpO2 98%   BMI 31.74 kg/m  Wt Readings from Last 3 Encounters:  08/19/16 179 lb 3.2 oz (81.3 kg)  07/17/16 179 lb (81.2 kg)  04/07/16 175 lb 9.6 oz (79.7 kg)     Lab Results  Component Value Date   WBC 4.4 02/11/2015   HGB  12.6 02/11/2015   HCT 36.2 02/11/2015   PLT 210 02/11/2015   GLUCOSE 93 06/06/2015   CHOL 177 06/06/2015   TRIG 54.0 06/06/2015   HDL 74.20 06/06/2015   LDLCALC 92 06/06/2015   ALT 20 06/06/2015   AST 19 06/06/2015   NA 139 06/06/2015   K 4.2 06/06/2015   CL 108 06/06/2015   CREATININE 0.97 06/06/2015   BUN 16 06/06/2015   CO2 26 06/06/2015   TSH 2.63 06/06/2015    Mm Screening Breast Tomo Bilateral  Result Date: 06/12/2015 CLINICAL DATA:  Screening. EXAM: 2D DIGITAL SCREENING BILATERAL MAMMOGRAM WITH CAD AND ADJUNCT TOMO COMPARISON:  Previous exam(s). ACR Breast Density  Category b: There are scattered areas of fibroglandular density. FINDINGS: There are no findings suspicious for malignancy. Images were processed with CAD. IMPRESSION: No mammographic evidence of malignancy. A result letter of this screening mammogram will be mailed directly to the patient. RECOMMENDATION: Screening mammogram in one year. (Code:SM-B-01Y) BI-RADS CATEGORY  1: Negative. Electronically Signed   By: Beckie SaltsSteven  Reid M.D.   On: 06/12/2015 11:26       Assessment & Plan:   Problem List Items Addressed This Visit    Asthma    Seeing pulmonary.  With known asthma.  Will go ahead and give her pneumovax today.  Breathing stable.  Continue current regimen.        GERD (gastroesophageal reflux disease)    On dexilant.  Followed by GI.        Relevant Medications   dexlansoprazole (DEXILANT) 60 MG capsule   Health care maintenance    Physical today 08/19/16.  Scheduled for mammogram.  Colonoscopy 12/13/13 - internal hemorrhoids and diverticulosis.  Schedule bone density.        Hypercholesteremia    Low cholesterol diet and exercise.  Follow lipid panel.        Relevant Orders   CBC with Differential/Platelet   Hepatic function panel   Lipid panel   TSH   Basic metabolic panel   Interstitial cystitis    Stable.  Has been followed by Dr Achilles Dunkope.        Other Visit Diagnoses    Routine general  medical examination at a health care facility    -  Primary   Screening for breast cancer       Relevant Orders   MM DIGITAL SCREENING BILATERAL   Estrogen deficiency       Relevant Orders   DG Bone Density       Dale DurhamSCOTT, Becci Batty, MD

## 2016-08-19 NOTE — Assessment & Plan Note (Signed)
Low cholesterol diet and exercise.  Follow lipid panel.   

## 2016-08-19 NOTE — Assessment & Plan Note (Signed)
Stable.  Has been followed by Dr Achilles Dunkope.

## 2016-08-19 NOTE — Assessment & Plan Note (Signed)
On dexilant.  Followed by GI.   

## 2016-08-19 NOTE — Assessment & Plan Note (Signed)
Seeing pulmonary.  With known asthma.  Will go ahead and give her pneumovax today.  Breathing stable.  Continue current regimen.

## 2016-08-19 NOTE — Progress Notes (Signed)
Pre-visit discussion using our clinic review tool. No additional management support is needed unless otherwise documented below in the visit note.  

## 2016-08-19 NOTE — Assessment & Plan Note (Addendum)
Physical today 08/19/16.  Scheduled for mammogram.  Colonoscopy 12/13/13 - internal hemorrhoids and diverticulosis.  Schedule bone density.

## 2016-08-20 NOTE — Addendum Note (Signed)
Addended by: Donnamarie PoagHOMPSON, Shonya Sumida Y on: 08/20/2016 03:44 PM   Modules accepted: Orders

## 2016-08-29 LAB — HM DEXA SCAN

## 2016-09-10 ENCOUNTER — Other Ambulatory Visit (INDEPENDENT_AMBULATORY_CARE_PROVIDER_SITE_OTHER): Payer: BLUE CROSS/BLUE SHIELD

## 2016-09-10 DIAGNOSIS — E7800 Pure hypercholesterolemia, unspecified: Secondary | ICD-10-CM | POA: Diagnosis not present

## 2016-09-10 LAB — BASIC METABOLIC PANEL
BUN: 15 mg/dL (ref 6–23)
CALCIUM: 9.5 mg/dL (ref 8.4–10.5)
CO2: 29 mEq/L (ref 19–32)
Chloride: 104 mEq/L (ref 96–112)
Creatinine, Ser: 1.08 mg/dL (ref 0.40–1.20)
GFR: 54.61 mL/min — AB (ref >60.00)
GLUCOSE: 105 mg/dL — AB (ref 70–99)
Potassium: 4.3 mEq/L (ref 3.5–5.1)
SODIUM: 139 meq/L (ref 135–145)

## 2016-09-10 LAB — CBC WITH DIFFERENTIAL/PLATELET
BASOS ABS: 0 10*3/uL (ref 0.0–0.1)
BASOS PCT: 0.6 % (ref 0.0–3.0)
Eosinophils Absolute: 0.5 10*3/uL (ref 0.0–0.7)
Eosinophils Relative: 10 % — ABNORMAL HIGH (ref 0.0–5.0)
HEMATOCRIT: 39.6 % (ref 36.0–46.0)
HEMOGLOBIN: 13.4 g/dL (ref 12.0–15.0)
LYMPHS PCT: 37.5 % (ref 12.0–46.0)
Lymphs Abs: 1.7 10*3/uL (ref 0.7–4.0)
MCHC: 33.8 g/dL (ref 30.0–36.0)
MCV: 86.8 fl (ref 78.0–100.0)
MONO ABS: 0.3 10*3/uL (ref 0.1–1.0)
Monocytes Relative: 6.1 % (ref 3.0–12.0)
Neutro Abs: 2.1 10*3/uL (ref 1.4–7.7)
Neutrophils Relative %: 45.8 % (ref 43.0–77.0)
Platelets: 224 10*3/uL (ref 150.0–400.0)
RBC: 4.56 Mil/uL (ref 3.87–5.11)
RDW: 13.9 % (ref 11.5–15.5)
WBC: 4.6 10*3/uL (ref 4.0–10.5)

## 2016-09-10 LAB — LIPID PANEL
CHOL/HDL RATIO: 3
Cholesterol: 203 mg/dL — ABNORMAL HIGH (ref 0–200)
HDL: 73 mg/dL (ref >39.00)
LDL CALC: 115 mg/dL — AB (ref 0–99)
NONHDL: 129.52
TRIGLYCERIDES: 74 mg/dL (ref 0.0–149.0)
VLDL: 14.8 mg/dL (ref 0.0–40.0)

## 2016-09-10 LAB — HEPATIC FUNCTION PANEL
ALT: 28 U/L (ref 0–35)
AST: 23 U/L (ref 0–37)
Albumin: 4.3 g/dL (ref 3.5–5.2)
Alkaline Phosphatase: 105 U/L (ref 39–117)
BILIRUBIN DIRECT: 0.1 mg/dL (ref 0.0–0.3)
BILIRUBIN TOTAL: 0.4 mg/dL (ref 0.2–1.2)
Total Protein: 6.3 g/dL (ref 6.0–8.3)

## 2016-09-10 LAB — TSH: TSH: 2.5 u[IU]/mL (ref 0.35–4.50)

## 2016-09-14 ENCOUNTER — Encounter: Payer: Self-pay | Admitting: Internal Medicine

## 2016-09-23 ENCOUNTER — Ambulatory Visit
Admission: RE | Admit: 2016-09-23 | Discharge: 2016-09-23 | Disposition: A | Discharge status: Home / Self Care | Payer: BLUE CROSS/BLUE SHIELD | Source: Ambulatory Visit | Attending: Internal Medicine | Admitting: Internal Medicine

## 2016-09-23 DIAGNOSIS — Z1231 Encounter for screening mammogram for malignant neoplasm of breast: Secondary | ICD-10-CM | POA: Insufficient documentation

## 2016-09-23 DIAGNOSIS — Z1239 Encounter for other screening for malignant neoplasm of breast: Secondary | ICD-10-CM

## 2016-10-27 DIAGNOSIS — M7582 Other shoulder lesions, left shoulder: Secondary | ICD-10-CM | POA: Insufficient documentation

## 2016-10-27 DIAGNOSIS — M6530 Trigger finger, unspecified finger: Secondary | ICD-10-CM | POA: Insufficient documentation

## 2016-10-27 DIAGNOSIS — Z9889 Other specified postprocedural states: Secondary | ICD-10-CM | POA: Insufficient documentation

## 2016-10-29 ENCOUNTER — Other Ambulatory Visit: Payer: BLUE CROSS/BLUE SHIELD

## 2016-11-26 ENCOUNTER — Other Ambulatory Visit: Payer: Self-pay | Admitting: Internal Medicine

## 2016-11-26 MED ORDER — FLUTICASONE FUROATE 100 MCG/ACT IN AEPB: 1.0000 | INHALATION_SPRAY | Freq: Every day | RESPIRATORY_TRACT | 5 refills | Status: DC

## 2016-11-28 ENCOUNTER — Telehealth: Payer: Self-pay | Admitting: Internal Medicine

## 2016-11-28 NOTE — Telephone Encounter (Signed)
Pt would like to know if there is a generic version of Arnuity   She doesn't have insurance on medications  Please advise.

## 2016-12-04 NOTE — Telephone Encounter (Signed)
Informed pt there is no generic for Arnuity at this time. Pt verbalized understanding. Nothing further needed.

## 2017-01-08 ENCOUNTER — Encounter: Payer: Self-pay | Admitting: Internal Medicine

## 2017-01-08 ENCOUNTER — Ambulatory Visit (INDEPENDENT_AMBULATORY_CARE_PROVIDER_SITE_OTHER): Payer: BLUE CROSS/BLUE SHIELD | Admitting: Internal Medicine

## 2017-01-08 VITALS — BP 120/80 | HR 83 | Ht 63.0 in | Wt 186.0 lb

## 2017-01-08 DIAGNOSIS — Z23 Encounter for immunization: Secondary | ICD-10-CM | POA: Diagnosis not present

## 2017-01-08 DIAGNOSIS — J4520 Mild intermittent asthma, uncomplicated: Secondary | ICD-10-CM

## 2017-01-08 MED ORDER — FLUTICASONE FUROATE 100 MCG/ACT IN AEPB: 1.0000 | INHALATION_SPRAY | Freq: Every day | RESPIRATORY_TRACT | 5 refills | Status: DC

## 2017-01-08 NOTE — Addendum Note (Signed)
Addended by: Janean SarkSNIPES, SONYA K on: 01/08/2017 10:54 AM   Modules accepted: Orders

## 2017-01-08 NOTE — Progress Notes (Signed)
[facility]      MRN# [id number] Savannah NeighborsCynthia H Gomez [birth year]   CC  Follow up asthma   Brief History: 61 yo with Hx of Asthma (positive MCH), stopped meds, and presented back to [place] with chronic cough, now on Arnuity with significant improvement.  Dx with ASTHMA 15 years ago Pneumonia 2 years ago Triggers-pollen, cats,dogs  HPI Doing well overall, no SOB, no cough and no wheezing Well controlled at this time Last albuterol use was 3 months ago No signs of infection No signs of exacerbation +GRED under control +alerghic rhinitis seems to be controlled with zyrtec   Current Outpatient Prescriptions:  .  albuterol (PROVENTIL HFA;VENTOLIN HFA) 108 (90 Base) MCG/ACT inhaler, Inhale 2 puffs into the lungs every 6 (six) hours as needed for wheezing or shortness of breath., Disp: 1 Inhaler, Rfl: 2 .  calcium carbonate (CALCIUM 600) 600 MG TABS tablet, Take 600 mg by mouth daily with breakfast., Disp: , Rfl:  .  cetirizine (ZYRTEC) 10 MG tablet, Take 10 mg by mouth daily., Disp: , Rfl:  .  dexlansoprazole (DEXILANT) 60 MG capsule, Take 1 capsule (60 mg total) by mouth daily., Disp: 30 capsule, Rfl: 5 .  diltiazem (CARDIZEM CD) 120 MG 24 hr capsule, , Disp: , Rfl:  .  Docusate Calcium (STOOL SOFTENER PO), Take by mouth daily., Disp: , Rfl:  .  Fluticasone Furoate (ARNUITY ELLIPTA) 100 MCG/ACT AEPB, Inhale 1 puff into the lungs daily., Disp: 30 each, Rfl: 5 .  imipramine (TOFRANIL) 25 MG tablet, Take 25 mg by mouth 2 (two) times daily., Disp: , Rfl:  .  metoprolol succinate (TOPROL-XL) 25 MG 24 hr tablet, Take 0.5 tablets (12.5 mg total) by mouth daily. (Patient taking differently: Take 25 mg by mouth daily. ), Disp: 15 tablet, Rfl: 5 .  mirabegron ER (MYRBETRIQ) 25 MG TB24, Take 25 mg by mouth 2 (two) times daily., Disp: , Rfl:  .  topiramate (TOPAMAX) 50 MG tablet, Take 50 mg by mouth 2 (two) times daily., Disp: , Rfl:    Review of Systems    Constitutional: Negative for chills and fever.  HENT: Negative for congestion.   Eyes: Negative for blurred vision and double vision.  Respiratory: Negative for cough, hemoptysis, sputum production, shortness of breath and wheezing.   Cardiovascular: Negative for chest pain.  Gastrointestinal: Negative for heartburn and nausea.  Skin: Negative for rash.      Allergies:  Demerol [meperidine]  BP 120/80 (BP Location: Left Arm, Cuff Size: Normal)   Pulse 83   Ht 5\' 3"  (1.6 m)   Wt 186 lb (84.4 kg)   SpO2 96%   BMI 32.95 kg/m   Physical Examination:  General Appearance: No distress  HEENT: PERRLA, no ptosis, no other lesions noticed Pulmonary:normal breath sounds., diaphragmatic excursion normal.No wheezing, No rales   Cardiovascular:  Normal S1,S2.  No m/r/g.      Pulmonary function test 12/11/2015 FEV1 92% FEV1/FVC 80% RV 106% TLC 102% DLCO corrected and her 42% Impression: Nonobstructive process by spirometry, no significant response to bronchodilators. Mild scooping of end expiratory curve, consistent with a mild obstructive process. Overall fits the clinical diagnosis of asthma.  6 minute walk test total distance 1240 feet/378 m low saturation 99%, highest heart rate 78    Assessment and Plan:  61 year old female with mild intermittent well controlled ASTHMA  With well controlled allergic rhinitis with underlying GERD Asthma Asthma - mild intermittent Cont with ANORO -avoid triggers -continue Zyrtec -  continuePPI Patient to be given flu shot today   Follow up in 6 months  Patient are satisfied with Plan of action and management. All questions answered  [name], M.D.  [facility]  Medical Director [facility] Medical Director [facility] Cardio-Pulmonary Department

## 2017-01-08 NOTE — Addendum Note (Signed)
Addended by: Janean SarkSNIPES, Mase Dhondt K on: 01/08/2017 10:39 AM   Modules accepted: Orders

## 2017-01-08 NOTE — Patient Instructions (Addendum)
FLU SHOT TODAY!  Continue inhalers as prescribed

## 2017-01-22 ENCOUNTER — Telehealth: Payer: Self-pay | Admitting: Internal Medicine

## 2017-01-22 ENCOUNTER — Encounter: Payer: Self-pay | Admitting: Internal Medicine

## 2017-01-22 ENCOUNTER — Ambulatory Visit (INDEPENDENT_AMBULATORY_CARE_PROVIDER_SITE_OTHER): Payer: BLUE CROSS/BLUE SHIELD | Admitting: Internal Medicine

## 2017-01-22 VITALS — BP 126/80 | HR 78 | Resp 16 | Ht 63.0 in | Wt 184.0 lb

## 2017-01-22 DIAGNOSIS — J4551 Severe persistent asthma with (acute) exacerbation: Secondary | ICD-10-CM

## 2017-01-22 MED ORDER — ALBUTEROL SULFATE HFA 108 (90 BASE) MCG/ACT IN AERS
2.0000 | INHALATION_SPRAY | Freq: Four times a day (QID) | RESPIRATORY_TRACT | 5 refills | Status: DC | PRN
Start: 2017-01-22 — End: 2018-06-01

## 2017-01-22 NOTE — Telephone Encounter (Signed)
Pt states she has a croupy cough, SOB , and headache. Please call.

## 2017-01-22 NOTE — Progress Notes (Signed)
[facility]      MRN# [id number] Wende NeighborsCynthia H Rosebrook October 21, 1955   CC  Follow up asthma   Brief History: 61 yo with Hx of Asthma (positive MCH), stopped meds, and presented back to [place] with chronic cough, now on Arnuity with significant improvement.  Dx with ASTHMA 15 years ago Pneumonia 2 years ago Triggers-pollen, cats,dogs  HPI Today she notes that she has been having a lot of cough, congestion, wheezing.  She takes arnuity daily. She is using albuterol rarely, but she has forgot to use it now that she is feeling sicker. She does not have a nebulizer at home.  +GRED under control, she is taking prevacid.  +alerghic rhinitis seems to be controlled with zyrtec   Current Outpatient Prescriptions:  .  albuterol (PROVENTIL HFA;VENTOLIN HFA) 108 (90 Base) MCG/ACT inhaler, Inhale 2 puffs into the lungs every 6 (six) hours as needed for wheezing or shortness of breath., Disp: 1 Inhaler, Rfl: 2 .  calcium carbonate (CALCIUM 600) 600 MG TABS tablet, Take 600 mg by mouth daily with breakfast., Disp: , Rfl:  .  cetirizine (ZYRTEC) 10 MG tablet, Take 10 mg by mouth daily., Disp: , Rfl:  .  dexlansoprazole (DEXILANT) 60 MG capsule, Take 1 capsule (60 mg total) by mouth daily., Disp: 30 capsule, Rfl: 5 .  diltiazem (CARDIZEM CD) 120 MG 24 hr capsule, , Disp: , Rfl:  .  Docusate Calcium (STOOL SOFTENER PO), Take by mouth daily., Disp: , Rfl:  .  Fluticasone Furoate (ARNUITY ELLIPTA) 100 MCG/ACT AEPB, Inhale 1 puff into the lungs daily., Disp: 30 each, Rfl: 5 .  imipramine (TOFRANIL) 25 MG tablet, Take 25 mg by mouth 2 (two) times daily., Disp: , Rfl:  .  metoprolol succinate (TOPROL-XL) 25 MG 24 hr tablet, Take 0.5 tablets (12.5 mg total) by mouth daily. (Patient taking differently: Take 25 mg by mouth daily. ), Disp: 15 tablet, Rfl: 5 .  mirabegron ER (MYRBETRIQ) 25 MG TB24, Take 25 mg by mouth 2 (two) times daily., Disp: , Rfl:  .  topiramate (TOPAMAX) 50 MG  tablet, Take 50 mg by mouth 2 (two) times daily., Disp: , Rfl:    Review of Systems  Constitutional: Negative for chills and fever.  Eyes: Negative for blurred vision and double vision.  Respiratory: Positive for cough, shortness of breath and wheezing.   Cardiovascular: Positive for chest pain.  Gastrointestinal: Negative for heartburn and nausea.  Genitourinary: Negative.   Musculoskeletal: Negative.   Skin: Negative for rash.  Neurological: Positive for weakness and headaches.  Endo/Heme/Allergies: Negative.       Allergies:  Demerol [meperidine]  BP 126/80 (BP Location: Left Arm, Cuff Size: Normal)   Pulse 78   Resp 16   Ht 5\' 3"  (1.6 m)   Wt 184 lb (83.5 kg)   SpO2 100%   BMI 32.59 kg/m   Physical Examination:  General Appearance: No distress  HEENT: PERRLA, no ptosis, no other lesions noticed Pulmonary:normal breath sounds., diaphragmatic excursion normal.No wheezing, No rales   Cardiovascular:  Normal S1,S2.  No m/r/g.      Pulmonary function test 12/11/2015 FEV1 92% FEV1/FVC 80% RV 106% TLC 102% DLCO corrected and her 42% Impression: Nonobstructive process by spirometry, no significant response to bronchodilators. Mild scooping of end expiratory curve, consistent with a mild obstructive process. Overall fits the clinical diagnosis of asthma.  6 minute walk test total distance 1240 feet/378 m low saturation 99%, highest heart rate 78  Assessment and Plan:  61 year old female with mild intermittent well controlled ASTHMA -- now with acute exacerbation, likely viral syndrome. Asthma Asthma - mild intermittent at baseline now with an exacerbation, which appears mild. Cont with inhaled steroid -avoid triggers -continue Zyrtec -continuePPI -Patient is reminded to use her rescue inhaler, she is asked to use 2 puffs 3 times daily until feeling better.  She is asked to call back on Monday if she is not feeling better and we will prescribe another course of  prednisone or an antibiotic.   Follow up in 6 months with Dr. Belia Heman.   Wells Guiles, MD.   Board Certified in Internal Medicine, Pulmonary Medicine, Critical Care Medicine, and Sleep Medicine.  [place] [facility] Office Number: [phone] Pager: [phone]  Santiago Glad, M.D.  Billy Fischer, M.D

## 2017-01-22 NOTE — Addendum Note (Signed)
Addended by: Janean SarkSNIPES, SONYA K on: 01/22/2017 12:03 PM   Modules accepted: Orders

## 2017-01-22 NOTE — Telephone Encounter (Signed)
Called patient and offer appt to come in today. Patient scheduled 11:45 01-22-17. Nothing further needed.

## 2017-01-22 NOTE — Patient Instructions (Signed)
--  Start using your albuterol rescue inhaler 2 puffs three times daily until feeling better.   --Call back on Monday morning if not feeling better and we will call in a prescription for prednisone or an antibiotic.

## 2017-02-19 ENCOUNTER — Ambulatory Visit (INDEPENDENT_AMBULATORY_CARE_PROVIDER_SITE_OTHER): Payer: BLUE CROSS/BLUE SHIELD | Admitting: Internal Medicine

## 2017-02-19 ENCOUNTER — Encounter: Payer: Self-pay | Admitting: Internal Medicine

## 2017-02-19 DIAGNOSIS — E7800 Pure hypercholesterolemia, unspecified: Secondary | ICD-10-CM | POA: Diagnosis not present

## 2017-02-19 DIAGNOSIS — K219 Gastro-esophageal reflux disease without esophagitis: Secondary | ICD-10-CM

## 2017-02-19 DIAGNOSIS — J329 Chronic sinusitis, unspecified: Secondary | ICD-10-CM

## 2017-02-19 DIAGNOSIS — J4520 Mild intermittent asthma, uncomplicated: Secondary | ICD-10-CM | POA: Diagnosis not present

## 2017-02-19 MED ORDER — AMOXICILLIN 875 MG PO TABS: 875.0000 mg | ORAL_TABLET | Freq: Two times a day (BID) | ORAL | 0 refills | Status: DC

## 2017-02-19 NOTE — Patient Instructions (Signed)
Saline nasal spray - flush nose at least 2-3x/day  nasacort (or flonase) nasal spray - 2 sprays each nostril one time per day.  Do this in the evening.    Continue mucinex.

## 2017-02-19 NOTE — Progress Notes (Signed)
Pre visit review using our clinic review tool, if applicable. No additional management support is needed unless otherwise documented below in the visit note. 

## 2017-02-19 NOTE — Progress Notes (Signed)
Patient ID: Savannah NeighborsCynthia H Allis, female   DOB: March 14, 1956, 61 y.o.   MRN: [id number]   Subjective:    Patient ID: Savannah NeighborsCynthia H Marciel, female    DOB: March 14, 1956, 61 y.o.   MRN: [id number]  HPI  Patient here for a scheduled follow up.  She states she is doing relatively well.  Tries to stay active.  No chest pain.  No sob.  Breathing overall has been stable.  She has noticed recently some increased congestion.  Went to the beach 12/2016.  Saw Dr Belia HemanKasa after,  Since the visit, she has noticed increased congestion.  Noticed sore throat, increased nasal congestion and sinus pressure.  Some increased cough.  No chest tightness.  No sob.  Symptoms progressing.  No acid reflux.  No problems swallowing.  No abdominal pain or cramping.  Bowels stable.     Past Medical History:  Diagnosis Date  . Allergy   . Arthritis   . Asthma   . Chicken pox   . GERD (gastroesophageal reflux disease)   . Migraine headache   . Urine incontinence    Past Surgical History:  Procedure Laterality Date  . BREAST CYST ASPIRATION Bilateral   . carpal tunnel sugery     bilateral  . ELBOW SURGERY  1999  . HAND SURGERY  1999   broken finger - pins  . NASAL SINUS SURGERY  1992  . ROTATOR CUFF REPAIR  1994  . Trigger finger surgery  1999 and 2000  . VAGINAL HYSTERECTOMY  1987   secondary to fibroids   Family History  Problem Relation Age of Onset  . Colon cancer Mother        colon  . Asthma Mother   . Heart disease Father   . Hypertension Father   . Colon cancer Sister   . Stroke Maternal Grandfather   . Diabetes Maternal Grandfather   . Lung cancer Maternal Grandmother   . Breast cancer Maternal Grandmother    Social History   Socioeconomic History  . Marital status: Married    Spouse name: None  . Number of children: None  . Years of education: None  . Highest education level: None  Social Needs  . Financial resource strain: None  . Food insecurity - worry: None  . Food insecurity - inability: None    . Transportation needs - medical: None  . Transportation needs - non-medical: None  Occupational History  . None  Tobacco Use  . Smoking status: Never Smoker  . Smokeless tobacco: Never Used  Substance and Sexual Activity  . Alcohol use: No    Alcohol/week: 0.0 oz  . Drug use: No  . Sexual activity: None  Other Topics Concern  . None  Social History Narrative  . None    Outpatient Encounter Medications as of 02/19/2017  Medication Sig  . albuterol (PROVENTIL HFA;VENTOLIN HFA) 108 (90 Base) MCG/ACT inhaler Inhale 2 puffs into the lungs every 6 (six) hours as needed for wheezing or shortness of breath.  . calcium carbonate (CALCIUM 600) 600 MG TABS tablet Take 600 mg by mouth daily with breakfast.  . cetirizine (ZYRTEC) 10 MG tablet Take 10 mg by mouth daily.  Marland Kitchen. dexlansoprazole (DEXILANT) 60 MG capsule Take 1 capsule (60 mg total) by mouth daily.  Marland Kitchen. diltiazem (CARDIZEM CD) 120 MG 24 hr capsule   . Docusate Calcium (STOOL SOFTENER PO) Take by mouth daily.  . Fluticasone Furoate (ARNUITY ELLIPTA) 100 MCG/ACT AEPB Inhale 1 puff into the lungs daily.  .Marland Kitchen  imipramine (TOFRANIL) 25 MG tablet Take 25 mg by mouth 2 (two) times daily.  . metoprolol succinate (TOPROL-XL) 25 MG 24 hr tablet Take 0.5 tablets (12.5 mg total) by mouth daily. (Patient taking differently: Take 25 mg by mouth daily. )  . mirabegron ER (MYRBETRIQ) 25 MG TB24 Take 25 mg by mouth 2 (two) times daily.  Marland Kitchen topiramate (TOPAMAX) 50 MG tablet Take 50 mg by mouth 2 (two) times daily.  Marland Kitchen amoxicillin (AMOXIL) 875 MG tablet Take 1 tablet (875 mg total) by mouth 2 (two) times daily.   No facility-administered encounter medications on file as of 02/19/2017.     Review of Systems  Constitutional: Negative for appetite change and unexpected weight change.  HENT: Positive for congestion, sinus pressure and sore throat.   Respiratory: Negative for chest tightness, shortness of breath and wheezing.   Cardiovascular: Negative for  chest pain, palpitations and leg swelling.  Gastrointestinal: Negative for abdominal pain, diarrhea, nausea and vomiting.  Genitourinary: Negative for difficulty urinating and dysuria.  Musculoskeletal: Negative for joint swelling and myalgias.  Skin: Negative for color change and rash.  Neurological: Negative for dizziness and light-headedness.       Sinus headache.  No unusual headache.    Psychiatric/Behavioral: Negative for agitation and dysphoric mood.       Objective:     Blood pressure rechecked by me:  134/82  Physical Exam  Constitutional: She appears well-developed and well-nourished. No distress.  HENT:  Mouth/Throat: Oropharynx is clear and moist.  Nares:  Slightly erythematous turbinates.  Minimal tenderness to palpation over the sinuses.    Neck: Neck supple. No thyromegaly present.  Cardiovascular: Normal rate and regular rhythm.  Pulmonary/Chest: Breath sounds normal. No respiratory distress. She has no wheezes.  Abdominal: Soft. Bowel sounds are normal. There is no tenderness.  Musculoskeletal: She exhibits no edema or tenderness.  Lymphadenopathy:    She has no cervical adenopathy.  Skin: No rash noted. No erythema.  Psychiatric: She has a normal mood and affect. Her behavior is normal.    BP 136/66   Pulse 78   Temp 97.8 F (36.6 C) (Oral)   Ht 5\' 3"  (1.6 m)   Wt 188 lb 9.6 oz (85.5 kg)   SpO2 98%   BMI 33.41 kg/m  Wt Readings from Last 3 Encounters:  02/19/17 188 lb 9.6 oz (85.5 kg)  01/22/17 184 lb (83.5 kg)  01/08/17 186 lb (84.4 kg)     Lab Results  Component Value Date   WBC 4.6 09/10/2016   HGB 13.4 09/10/2016   HCT 39.6 09/10/2016   PLT 224.0 09/10/2016   GLUCOSE 105 (H) 09/10/2016   CHOL 203 (H) 09/10/2016   TRIG 74.0 09/10/2016   HDL 73.00 09/10/2016   LDLCALC 115 (H) 09/10/2016   ALT 28 09/10/2016   AST 23 09/10/2016   NA 139 09/10/2016   K 4.3 09/10/2016   CL 104 09/10/2016   CREATININE 1.08 09/10/2016   BUN 15 09/10/2016     CO2 29 09/10/2016   TSH 2.50 09/10/2016    Mm Screening Breast Tomo Bilateral  Result Date: 09/23/2016 CLINICAL DATA:  Screening. EXAM: 2D DIGITAL SCREENING BILATERAL MAMMOGRAM WITH CAD AND ADJUNCT TOMO COMPARISON:  Previous exam(s). ACR Breast Density Category b: There are scattered areas of fibroglandular density. FINDINGS: There are no findings suspicious for malignancy. Images were processed with CAD. IMPRESSION: No mammographic evidence of malignancy. A result letter of this screening mammogram will be mailed directly to the patient.  RECOMMENDATION: Screening mammogram in one year. (Code:SM-B-01Y) BI-RADS CATEGORY  1: Negative. Electronically Signed   By: Edwin CapJennifer  Jarosz M.D.   On: 09/23/2016 12:23       Assessment & Plan:   Problem List Items Addressed This Visit    Asthma    Followed by pulmonary.  Breathing overall stable.        GERD (gastroesophageal reflux disease)    Followed by GI. On dexilant.  Controlled.        Hypercholesteremia    Low cholesterol diet and exercise.  Follow lipid panel.        Sinusitis    Symptoms as outlined.  Progressing.  Will treat with mucinex, saline nasal spray and steroid nasal spray as directed.  Inhalers if needed.  Gave her a prescription for amoxicillin, to use if symptoms persist or progress.  Discussed the need for probiotic if takes abx.         Relevant Medications   amoxicillin (AMOXIL) 875 MG tablet       Dale DurhamSCOTT, Shir Bergman, MD

## 2017-02-22 ENCOUNTER — Encounter: Payer: Self-pay | Admitting: Internal Medicine

## 2017-02-22 DIAGNOSIS — J329 Chronic sinusitis, unspecified: Secondary | ICD-10-CM | POA: Insufficient documentation

## 2017-02-22 NOTE — Assessment & Plan Note (Signed)
Symptoms as outlined.  Progressing.  Will treat with mucinex, saline nasal spray and steroid nasal spray as directed.  Inhalers if needed.  Gave her a prescription for amoxicillin, to use if symptoms persist or progress.  Discussed the need for probiotic if takes abx.

## 2017-02-22 NOTE — Assessment & Plan Note (Signed)
Followed by GI. On dexilant.  Controlled.

## 2017-02-22 NOTE — Assessment & Plan Note (Signed)
Followed by pulmonary.  Breathing overall stable.

## 2017-02-22 NOTE — Assessment & Plan Note (Signed)
Low cholesterol diet and exercise.  Follow lipid panel.   

## 2017-03-20 ENCOUNTER — Ambulatory Visit: Payer: Self-pay | Admitting: *Deleted

## 2017-03-20 ENCOUNTER — Ambulatory Visit (INDEPENDENT_AMBULATORY_CARE_PROVIDER_SITE_OTHER): Payer: BLUE CROSS/BLUE SHIELD

## 2017-03-20 ENCOUNTER — Other Ambulatory Visit: Payer: Self-pay

## 2017-03-20 ENCOUNTER — Ambulatory Visit
Admission: EM | Admit: 2017-03-20 | Discharge: 2017-03-20 | Disposition: A | Discharge status: Home / Self Care | Payer: BLUE CROSS/BLUE SHIELD | Attending: Family Medicine | Admitting: Family Medicine

## 2017-03-20 ENCOUNTER — Encounter: Payer: Self-pay | Admitting: Emergency Medicine

## 2017-03-20 DIAGNOSIS — K219 Gastro-esophageal reflux disease without esophagitis: Secondary | ICD-10-CM | POA: Diagnosis not present

## 2017-03-20 DIAGNOSIS — J45909 Unspecified asthma, uncomplicated: Secondary | ICD-10-CM | POA: Diagnosis not present

## 2017-03-20 DIAGNOSIS — R05 Cough: Secondary | ICD-10-CM

## 2017-03-20 DIAGNOSIS — M1990 Unspecified osteoarthritis, unspecified site: Secondary | ICD-10-CM | POA: Insufficient documentation

## 2017-03-20 DIAGNOSIS — E7800 Pure hypercholesterolemia, unspecified: Secondary | ICD-10-CM | POA: Insufficient documentation

## 2017-03-20 DIAGNOSIS — R51 Headache: Secondary | ICD-10-CM | POA: Insufficient documentation

## 2017-03-20 DIAGNOSIS — R42 Dizziness and giddiness: Secondary | ICD-10-CM

## 2017-03-20 DIAGNOSIS — Z800 Family history of malignant neoplasm of digestive organs: Secondary | ICD-10-CM | POA: Diagnosis not present

## 2017-03-20 DIAGNOSIS — Z7951 Long term (current) use of inhaled steroids: Secondary | ICD-10-CM | POA: Diagnosis not present

## 2017-03-20 DIAGNOSIS — Z8249 Family history of ischemic heart disease and other diseases of the circulatory system: Secondary | ICD-10-CM | POA: Insufficient documentation

## 2017-03-20 DIAGNOSIS — R519 Headache, unspecified: Secondary | ICD-10-CM

## 2017-03-20 DIAGNOSIS — Z825 Family history of asthma and other chronic lower respiratory diseases: Secondary | ICD-10-CM | POA: Diagnosis not present

## 2017-03-20 DIAGNOSIS — Z79899 Other long term (current) drug therapy: Secondary | ICD-10-CM | POA: Insufficient documentation

## 2017-03-20 DIAGNOSIS — Z885 Allergy status to narcotic agent status: Secondary | ICD-10-CM | POA: Diagnosis not present

## 2017-03-20 DIAGNOSIS — Z8701 Personal history of pneumonia (recurrent): Secondary | ICD-10-CM | POA: Diagnosis not present

## 2017-03-20 DIAGNOSIS — R059 Cough, unspecified: Secondary | ICD-10-CM

## 2017-03-20 LAB — CBC WITH DIFFERENTIAL/PLATELET
BASOS ABS: 0 10*3/uL (ref 0–0.1)
Basophils Relative: 1 %
EOS PCT: 2 %
Eosinophils Absolute: 0.1 10*3/uL (ref 0–0.7)
HCT: 39.4 % (ref 35.0–47.0)
HEMOGLOBIN: 13.4 g/dL (ref 12.0–16.0)
Lymphocytes Relative: 20 %
Lymphs Abs: 1.3 10*3/uL (ref 1.0–3.6)
MCH: 29.6 pg (ref 26.0–34.0)
MCHC: 34 g/dL (ref 32.0–36.0)
MCV: 87 fL (ref 80.0–100.0)
Monocytes Absolute: 0.3 10*3/uL (ref 0.2–0.9)
Monocytes Relative: 5 %
NEUTROS ABS: 4.7 10*3/uL (ref 1.4–6.5)
NEUTROS PCT: 72 %
PLATELETS: 231 10*3/uL (ref 150–440)
RBC: 4.53 MIL/uL (ref 3.80–5.20)
RDW: 14 % (ref 11.5–14.5)
WBC: 6.4 10*3/uL (ref 3.6–11.0)

## 2017-03-20 LAB — BASIC METABOLIC PANEL
ANION GAP: 9 (ref 5–15)
BUN: 13 mg/dL (ref 6–20)
CHLORIDE: 101 mmol/L (ref 101–111)
CO2: 25 mmol/L (ref 22–32)
Calcium: 8.7 mg/dL — ABNORMAL LOW (ref 8.9–10.3)
Creatinine, Ser: 0.85 mg/dL (ref 0.44–1.00)
GFR calc Af Amer: >60 mL/min (ref >60)
GLUCOSE: 124 mg/dL — AB (ref 65–99)
POTASSIUM: 3.7 mmol/L (ref 3.5–5.1)
SODIUM: 135 mmol/L (ref 135–145)

## 2017-03-20 MED ORDER — ONDANSETRON 4 MG PO TBDP: 4.0000 mg | ORAL_TABLET | Freq: Three times a day (TID) | ORAL | 0 refills | Status: DC | PRN

## 2017-03-20 MED ORDER — BENZONATATE 100 MG PO CAPS: 100.0000 mg | ORAL_CAPSULE | Freq: Three times a day (TID) | ORAL | 0 refills | Status: DC | PRN

## 2017-03-20 MED ORDER — KETOROLAC TROMETHAMINE 60 MG/2ML IM SOLN
60.0000 mg | Freq: Once | INTRAMUSCULAR | Status: AC
  Administered 2017-03-20: 60 mg via INTRAMUSCULAR

## 2017-03-20 NOTE — Discharge Instructions (Signed)
Take medication as prescribed. Rest. Drink plenty of fluids.   Follow up with your primary care physician this week. Return to Urgent care as needed.  As discussed for any worsening headache, worsening lightheadedness sensation, chest pain, shortness of breath or worsening concerns proceed directly to the emergency room.

## 2017-03-20 NOTE — ED Triage Notes (Signed)
Patient in today c/o 5 day history of severe headache. Patient is using Tylenol Migraine. Cough started 2 days ago. Patient has had chills, but no fever.

## 2017-03-20 NOTE — Telephone Encounter (Signed)
Pt's husband, Jorja Loaim,  called and stated that his wife was having dizziness; pt placed on the phone and states that she started taking amoxicillin yesterday for a worsening sore throat; she states that she is dizzy upon standing with associated nausea and flushing; additionally has a croupy cough, severe headache and a fast heart rate; nurse triage initiated; per protocol pt and husband directed to call 911 now; they verbalize understanding and will follow recommendation; [facility]   Reason for Disposition . Shock suspected (e.g., cold/pale/clammy skin, too weak to stand, low BP, rapid pulse)  Answer Assessment - Initial Assessment Questions 1. DESCRIPTION: "Describe your dizziness."     severe 2. LIGHTHEADED: "Do you feel lightheaded?" (e.g., somewhat faint, woozy, weak upon standing)     Extremely faint upon standing 3. VERTIGO: "Do you feel like either you or the room is spinning or tilting?" (i.e. vertigo)     somewhat 4. SEVERITY: "How bad is it?"  "Do you feel like you are going to faint?" "Can you stand and walk?"   - MILD - walking normally   - MODERATE - interferes with normal activities (e.g., work, school)    - SEVERE - unable to stand, requires support to walk, feels like passing out now.      severe 5. ONSET:  "When did the dizziness begin?"     This morning 6. AGGRAVATING FACTORS: "Does anything make it worse?" (e.g., standing, change in head position)     standing 7. HEART RATE: "Can you tell me your heart rate?" "How many beats in 15 seconds?"  (Note: not all patients can do this)       Described as rapid 8. CAUSE: "What do you think is causing the dizziness?"     Started amoxicillin yesterday 9. RECURRENT SYMPTOM: "Have you had dizziness before?" If so, ask: "When was the last time?" "What happened that time?"     no 10. OTHER SYMPTOMS: "Do you have any other symptoms?" (e.g., fever, chest pain, vomiting, diarrhea,  bleeding)       Nausea, severe headache, flushing, croupy cough 11. PREGNANCY: "Is there any chance you are pregnant?" "When was your last menstrual period?"       No post menopausal  Protocols used: DIZZINESS [facility]- LIGHTHEADEDNESS-A-AH

## 2017-03-20 NOTE — Telephone Encounter (Signed)
Talked with patient and advised after seeing patient had not arrived at ER that she really needs to be evaluated today and that PCP is out of office today. That she really needs to be evaluated , patient staed she would go to ER now.

## 2017-03-20 NOTE — ED Provider Notes (Signed)
[facility] ____________________________________________  Time seen: Approximately 312 PM  I have reviewed the triage vital signs and the nursing notes.   HISTORY  Chief Complaint Cough and Headache  HPI Savannah Gomez is a 61 y.o. female present with husband at bedside for evaluation headache as well as a cough.  Patient reports she has been having headaches for approximately 4-5 days.  States headache is not constant but present daily.  States headache initially was at the base of her neck, now more to her forehead and cheek area described as an aching throbbing pain.  States has had a history of migraines with some similar presentation but has not had a migraine in years.  Denies abrupt onset.  States current headache 7 out of 10.  States did take over-the-counter Tylenol yesterday, no over-the-counter medications taken today prior to arrival.  States also has had accompanying cough and chest congestion present for last 3-4 days.  States she describes her cough as cough is similar to her previous pneumonia in the past.  States has had chills and some achiness, no known fevers.  States sinuses feel clogged, not much nasal drainage.  States that she had a prescription of amoxicillin at home and she has taken 3 doses without any change.  Also reports that today she is having some lightheadedness intermittently, stating worse with position changes.  States has been eating and drinking, but has not been eating and drinking nearly as much as normal.  States occasional nausea.  Denies vomiting or diarrhea.  Denies associated chest pain, shortness of breath, abdominal pain,syncope, near syncope, vision changes, unilateral weakness, dysuria, extremity pain, extremity swelling or rash.  Denies other home sick contacts.  Denies cardiac history.  Denies renal insufficiency.  History of asthma, denies any recent wheezing.    Past Medical History:  Diagnosis Date  . Allergy   . Arthritis     . Asthma   . Chicken pox   . GERD (gastroesophageal reflux disease)   . Migraine headache   . Urine incontinence     Patient Active Problem List   Diagnosis Date Noted  . Sinusitis 02/22/2017  . Muscle spasm 12/05/2015  . Asthma 10/17/2015  . Dyspnea 10/17/2015  . URI (upper respiratory infection) 07/24/2015  . Cough 03/06/2015  . CAP (community acquired pneumonia) 02/14/2015  . Family history of colon cancer 11/07/2014  . Abnormal mammogram 11/04/2014  . Health care maintenance 05/07/2014  . Fatigue 02/23/2013  . History of migraine headaches 01/18/2012  . Interstitial cystitis 01/18/2012  . Hypercholesteremia 01/18/2012  . GERD (gastroesophageal reflux disease) 01/18/2012    Past Surgical History:  Procedure Laterality Date  . BREAST CYST ASPIRATION Bilateral   . carpal tunnel sugery     bilateral  . ELBOW SURGERY  1999  . HAND SURGERY  1999   broken finger - pins  . NASAL SINUS SURGERY  1992  . ROTATOR CUFF REPAIR  1994  . Trigger finger surgery  1999 and 2000  . VAGINAL HYSTERECTOMY  1987   secondary to fibroids     No current facility-administered medications for this encounter.   Current Outpatient Medications:  .  amoxicillin (AMOXIL) 875 MG tablet, Take 1 tablet (875 mg total) by mouth 2 (two) times daily., Disp: 20 tablet, Rfl: 0 .  calcium carbonate (CALCIUM 600) 600 MG TABS tablet, Take 600 mg by mouth daily with breakfast., Disp: , Rfl:  .  cetirizine (ZYRTEC) 10 MG tablet, Take 10 mg by mouth  daily., Disp: , Rfl:  .  dexlansoprazole (DEXILANT) 60 MG capsule, Take 1 capsule (60 mg total) by mouth daily., Disp: 30 capsule, Rfl: 5 .  diltiazem (CARDIZEM CD) 120 MG 24 hr capsule, , Disp: , Rfl:  .  Docusate Calcium (STOOL SOFTENER PO), Take by mouth daily., Disp: , Rfl:  .  Fluticasone Furoate (ARNUITY ELLIPTA) 100 MCG/ACT AEPB, Inhale 1 puff into the lungs daily., Disp: 30 each, Rfl: 5 .  imipramine (TOFRANIL) 25 MG tablet, Take 25 mg by mouth 2 (two)  times daily., Disp: , Rfl:  .  metoprolol succinate (TOPROL-XL) 25 MG 24 hr tablet, Take 0.5 tablets (12.5 mg total) by mouth daily. (Patient taking differently: Take 25 mg by mouth daily. ), Disp: 15 tablet, Rfl: 5 .  mirabegron ER (MYRBETRIQ) 25 MG TB24, Take 25 mg by mouth 2 (two) times daily., Disp: , Rfl:  .  albuterol (PROVENTIL HFA;VENTOLIN HFA) 108 (90 Base) MCG/ACT inhaler, Inhale 2 puffs into the lungs every 6 (six) hours as needed for wheezing or shortness of breath., Disp: 1 Inhaler, Rfl: 5 .  benzonatate (TESSALON PERLES) 100 MG capsule, Take 1 capsule (100 mg total) by mouth 3 (three) times daily as needed for cough., Disp: 15 capsule, Rfl: 0 .  ondansetron (ZOFRAN ODT) 4 MG disintegrating tablet, Take 1 tablet (4 mg total) by mouth every 8 (eight) hours as needed for nausea or vomiting., Disp: 15 tablet, Rfl: 0 .  topiramate (TOPAMAX) 50 MG tablet, Take 50 mg by mouth 2 (two) times daily., Disp: , Rfl:   Allergies Demerol [meperidine]  Family History  Problem Relation Age of Onset  . Colon cancer Mother        colon  . Asthma Mother   . Heart disease Father   . Hypertension Father   . Colon cancer Sister   . Stroke Maternal Grandfather   . Diabetes Maternal Grandfather   . Lung cancer Maternal Grandmother   . Breast cancer Maternal Grandmother     Social History Social History   Tobacco Use  . Smoking status: Never Smoker  . Smokeless tobacco: Never Used  Substance Use Topics  . Alcohol use: No    Alcohol/week: 0.0 oz  . Drug use: No    Review of Systems Constitutional: As above.  Eyes: No visual changes. ENT: No sore throat. Cardiovascular: Denies chest pain. Respiratory: Denies shortness of breath. Gastrointestinal: No abdominal pain. No vomiting.  No diarrhea.  No constipation.  Some intermittent nausea. Genitourinary: Negative for dysuria. Musculoskeletal: Negative for back pain. Skin: Negative for rash. Neurological: Negative for focal weakness or  numbness.   ____________________________________________   PHYSICAL EXAM:  VITAL SIGNS: ED Triage Vitals [03/20/17 1452]  Enc Vitals Group     BP (!) 121/59     Pulse Rate 78     Resp 16     Temp 97.8 F (36.6 C)     Temp Source Oral     SpO2 100 %     Weight 180 lb (81.6 kg)     Height 5\' 3"  (1.6 m)     Head Circumference      Peak Flow      Pain Score 7     Pain Loc      Pain Edu?      Excl. in GC?     Constitutional: Alert and oriented. Well appearing and in no acute distress. Eyes: Conjunctivae are normal.  Head: Atraumatic.Mild tenderness to palpation bilateral frontal and maxillary  sinuses. No swelling. No erythema.   Ears: no erythema, normal TMs bilaterally.   Nose: No nasal congestion.  Mouth/Throat: Mucous membranes are moist.  Oropharynx non-erythematous.No tonsillar swelling or exudate.  Neck: No stridor.  No cervical spine tenderness to palpation. Hematological/Lymphatic/Immunilogical: No cervical lymphadenopathy. Cardiovascular: Normal rate, regular rhythm. Grossly normal heart sounds.  Good peripheral circulation. Respiratory: Normal respiratory effort.  No retractions.No wheezes, rales or rhonchi. Good air movement. Occasional dry cough noted in room.  Gastrointestinal: Soft and nontender.No CVA tenderness. Musculoskeletal: No lower or upper extremity tenderness nor edema.  Bilateral distal radial pulses equal and easily palpated. No cervical, thoracic or lumbar tenderness to palpation.  Neurologic:  Normal speech and language. No gross focal neurologic deficits are appreciated. No gait instability. 5/5 strength to bilateral upper and lower extremities. Steady gait. Negative Romberg. No ataxia.  Skin:  Skin is warm, dry and intact. No rash noted. Psychiatric: Mood and affect are normal. Speech and behavior are normal.  ___________________________________________   LABS (all labs ordered are listed, but only abnormal results are displayed)  Labs  Reviewed  BASIC METABOLIC PANEL - Abnormal; Notable for the following components:      Result Value   Glucose, Bld 124 (*)    Calcium 8.7 (*)    All other components within normal limits  CBC WITH DIFFERENTIAL/PLATELET   ____________________________________________  EKG  ED ECG REPORT I, Renford DillsLindsey Kimala Horne, the attending provider, personally viewed and interpreted this ECG.   Date: 03/20/2017  EKG Time: 1554  Rate: 68  Rhythm: normal EKG, normal sinus rhythm  Axis: normal  Intervals:none  ST&T Change: no St elevation or depression noted.   ____________________________________________  RADIOLOGY  Dg Chest 2 View  Result Date: 03/20/2017 CLINICAL DATA:  Severe headache EXAM: CHEST  2 VIEW COMPARISON:  04/07/2016 FINDINGS: The heart size and mediastinal contours are within normal limits. Both lungs are clear. Degenerative changes of the spine. IMPRESSION: No active cardiopulmonary disease. Electronically Signed   By: Jasmine PangKim  Fujinaga M.D.   On: 03/20/2017 15:56   ____________________________________________   PROCEDURES Procedures   INITIAL IMPRESSION / ASSESSMENT AND PLAN / ED COURSE  Pertinent labs & imaging results that were available during my care of the patient were reviewed by me and considered in my medical decision making (see chart for details).  Well-appearing patient.  Presenting with husband at bedside.  Complaining of headache as well as a cough.  Chest x-ray as per radiologist above no active cardiopulmonary disease.  Labs reviewed and unremarkable.   EKG unremarkable.  60 mg IM Toradol given once in urgent care.  After Toradol, patient reevaluated.  Patient reports headache improving and she is feeling better.  Patient states lightheadedness is also improved.  Discussed unclear etiology exactly of patient's symptoms.  No focal neurological deficits.  Discussed suspect viral infection and mild dehydration.  Encouraged rest, fluids, supportive care..  Zofran and  Tessalon Perles.  Discussed use of other cough medications and discussed due to intermittent lightheaded sensation will refrain from Tussionex or codeine with guaifenesin, patient and husband agree.Discussed indication, risks and benefits of medications with patient.  Discussed very strict follow-up and return parameters including proceed directly to emergency room for any increased headache or worsening concerns.  Discussed follow up with Primary care physician this week. Discussed follow up and return parameters including no resolution or any worsening concerns. Patient verbalized understanding and agreed to plan.   ____________________________________________   FINAL CLINICAL IMPRESSION(S) / ED DIAGNOSES  Final diagnoses:  Nonintractable headache, unspecified chronicity pattern, unspecified headache type  Cough  Light headedness     ED Discharge Orders        Ordered    benzonatate (TESSALON PERLES) 100 MG capsule  3 times daily PRN     03/20/17 1629    ondansetron (ZOFRAN ODT) 4 MG disintegrating tablet  Every 8 hours PRN     03/20/17 1629       Note: This dictation was prepared with Dragon dictation along with smaller phrase technology. Any transcriptional errors that result from this process are unintentional.         Renford Dills, NP 03/20/17 2136

## 2017-04-01 ENCOUNTER — Ambulatory Visit: Payer: Self-pay | Admitting: *Deleted

## 2017-04-01 ENCOUNTER — Encounter: Payer: Self-pay | Admitting: Internal Medicine

## 2017-04-01 NOTE — Telephone Encounter (Signed)
Savannah HerterShannon spoke to pt and she is scheduled to see me tomorrow.

## 2017-04-01 NOTE — Telephone Encounter (Signed)
Pt called [facility] reporting unresolving symptoms of cough, neck pain. Returned call , attempted to triage patient, pt refused. States will E-mail Dr. Lorin PicketScott directly.

## 2017-04-02 ENCOUNTER — Encounter: Payer: Self-pay | Admitting: Internal Medicine

## 2017-04-02 ENCOUNTER — Ambulatory Visit (INDEPENDENT_AMBULATORY_CARE_PROVIDER_SITE_OTHER): Payer: BLUE CROSS/BLUE SHIELD | Admitting: Internal Medicine

## 2017-04-02 DIAGNOSIS — J069 Acute upper respiratory infection, unspecified: Secondary | ICD-10-CM

## 2017-04-02 DIAGNOSIS — J4520 Mild intermittent asthma, uncomplicated: Secondary | ICD-10-CM | POA: Diagnosis not present

## 2017-04-02 DIAGNOSIS — R51 Headache: Secondary | ICD-10-CM | POA: Diagnosis not present

## 2017-04-02 DIAGNOSIS — R519 Headache, unspecified: Secondary | ICD-10-CM

## 2017-04-02 LAB — CBC WITH DIFFERENTIAL/PLATELET
BASOS PCT: 0.3 % (ref 0.0–3.0)
Basophils Absolute: 0 10*3/uL (ref 0.0–0.1)
EOS PCT: 3.6 % (ref 0.0–5.0)
Eosinophils Absolute: 0.2 10*3/uL (ref 0.0–0.7)
HEMATOCRIT: 41.7 % (ref 36.0–46.0)
HEMOGLOBIN: 13.9 g/dL (ref 12.0–15.0)
LYMPHS PCT: 18.5 % (ref 12.0–46.0)
Lymphs Abs: 1 10*3/uL (ref 0.7–4.0)
MCHC: 33.5 g/dL (ref 30.0–36.0)
MCV: 90 fl (ref 78.0–100.0)
Monocytes Absolute: 0.6 10*3/uL (ref 0.1–1.0)
Monocytes Relative: 10.5 % (ref 3.0–12.0)
Neutro Abs: 3.6 10*3/uL (ref 1.4–7.7)
Neutrophils Relative %: 67.1 % (ref 43.0–77.0)
Platelets: 241 10*3/uL (ref 150.0–400.0)
RBC: 4.63 Mil/uL (ref 3.87–5.11)
RDW: 13.9 % (ref 11.5–15.5)
WBC: 5.3 10*3/uL (ref 4.0–10.5)

## 2017-04-02 LAB — SEDIMENTATION RATE: Sed Rate: 32 mm/hr — ABNORMAL HIGH (ref 0–30)

## 2017-04-02 MED ORDER — PREDNISONE 10 MG PO TABS: ORAL_TABLET | ORAL | 0 refills | Status: DC

## 2017-04-02 MED ORDER — AZITHROMYCIN 250 MG PO TABS: ORAL_TABLET | ORAL | 0 refills | Status: DC

## 2017-04-02 NOTE — Progress Notes (Signed)
Patient ID: Savannah Gomez, female   DOB: 09-05-1955, 62 y.o.   MRN: [id number]   Subjective:    Patient ID: Savannah Gomez, female    DOB: 26-Mar-1955, 62 y.o.   MRN: [id number]  HPI  Patient here as a work in with concerns regarding persistent cough and congestion.  Also reports headache.  She was evaluated in Urgent Care on 03/20/17 with headache and cough and congestion.  Note reviewed.  CXR negative.  She was given shot of Tordol and felt better.  Discharged with zofran and tessalon perles.  Diagnosed with viral infection.  She reports that she is still having symptoms.  Persistent headache.  Describes neck pain and headache.  Feels a little different then her migraine headaches.  Increased sinus pressure and drainage.  No earache.  Does report increased cough - mostly non productive.  No acid reflux.  Describes some coughing fits.  Minimal nausea at times.  Minimal light headedness.  No vomiting.  some decreased appetite.      Past Medical History:  Diagnosis Date  . Allergy   . Arthritis   . Asthma   . Chicken pox   . GERD (gastroesophageal reflux disease)   . Migraine headache   . Urine incontinence    Past Surgical History:  Procedure Laterality Date  . BREAST CYST ASPIRATION Bilateral   . carpal tunnel sugery     bilateral  . ELBOW SURGERY  1999  . HAND SURGERY  1999   broken finger - pins  . NASAL SINUS SURGERY  1992  . ROTATOR CUFF REPAIR  1994  . Trigger finger surgery  1999 and 2000  . VAGINAL HYSTERECTOMY  1987   secondary to fibroids   Family History  Problem Relation Age of Onset  . Colon cancer Mother        colon  . Asthma Mother   . Heart disease Father   . Hypertension Father   . Colon cancer Sister   . Stroke Maternal Grandfather   . Diabetes Maternal Grandfather   . Lung cancer Maternal Grandmother   . Breast cancer Maternal Grandmother    Social History   Socioeconomic History  . Marital status: Married    Spouse name: None  . Number of  children: None  . Years of education: None  . Highest education level: None  Social Needs  . Financial resource strain: None  . Food insecurity - worry: None  . Food insecurity - inability: None  . Transportation needs - medical: None  . Transportation needs - non-medical: None  Occupational History  . None  Tobacco Use  . Smoking status: Never Smoker  . Smokeless tobacco: Never Used  Substance and Sexual Activity  . Alcohol use: No    Alcohol/week: 0.0 oz  . Drug use: No  . Sexual activity: None  Other Topics Concern  . None  Social History Narrative  . None    Outpatient Encounter Medications as of 04/02/2017  Medication Sig  . albuterol (PROVENTIL HFA;VENTOLIN HFA) 108 (90 Base) MCG/ACT inhaler Inhale 2 puffs into the lungs every 6 (six) hours as needed for wheezing or shortness of breath.  Marland Kitchen amoxicillin (AMOXIL) 875 MG tablet Take 1 tablet (875 mg total) by mouth 2 (two) times daily.  . benzonatate (TESSALON PERLES) 100 MG capsule Take 1 capsule (100 mg total) by mouth 3 (three) times daily as needed for cough.  . calcium carbonate (CALCIUM 600) 600 MG TABS tablet Take 600 mg by  mouth daily with breakfast.  . cetirizine (ZYRTEC) 10 MG tablet Take 10 mg by mouth daily.  Marland Kitchen dexlansoprazole (DEXILANT) 60 MG capsule Take 1 capsule (60 mg total) by mouth daily.  Marland Kitchen diltiazem (CARDIZEM CD) 120 MG 24 hr capsule   . Docusate Calcium (STOOL SOFTENER PO) Take by mouth daily.  . Fluticasone Furoate (ARNUITY ELLIPTA) 100 MCG/ACT AEPB Inhale 1 puff into the lungs daily.  Marland Kitchen imipramine (TOFRANIL) 25 MG tablet Take 25 mg by mouth 2 (two) times daily.  . metoprolol succinate (TOPROL-XL) 25 MG 24 hr tablet Take 0.5 tablets (12.5 mg total) by mouth daily. (Patient taking differently: Take 25 mg by mouth daily. )  . mirabegron ER (MYRBETRIQ) 25 MG TB24 Take 25 mg by mouth 2 (two) times daily.  . ondansetron (ZOFRAN ODT) 4 MG disintegrating tablet Take 1 tablet (4 mg total) by mouth every 8  (eight) hours as needed for nausea or vomiting.  . topiramate (TOPAMAX) 50 MG tablet Take 50 mg by mouth 2 (two) times daily.  Marland Kitchen azithromycin (ZITHROMAX) 250 MG tablet Take 2 tablets x 1 day and then 1 per day for four more days.  . predniSONE (DELTASONE) 10 MG tablet Take 6 tablets x 1 day and then decrease by 1/2 tablet per day until down to zero mg.   No facility-administered encounter medications on file as of 04/02/2017.     Review of Systems  Constitutional: Positive for fatigue.       Some decreased appetite.   HENT: Positive for congestion, postnasal drip and sinus pressure.   Respiratory: Positive for cough. Negative for chest tightness and shortness of breath.   Cardiovascular: Negative for chest pain and leg swelling.  Gastrointestinal: Positive for nausea. Negative for abdominal pain, diarrhea and vomiting.  Musculoskeletal: Negative for joint swelling and myalgias.  Skin: Negative for color change and rash.  Neurological: Positive for light-headedness and headaches.  Psychiatric/Behavioral: Negative for agitation and dysphoric mood.       Objective:    Physical Exam  Constitutional: She appears well-developed and well-nourished. No distress.  HENT:  Mouth/Throat: Oropharynx is clear and moist.  TMs visualized - no erythema.  Minimal erythema nasal turbinates.  Minimal tenderness to palpation over the sinuses.    Neck: Neck supple.  No neck rigidity.    Cardiovascular: Normal rate and regular rhythm.  Pulmonary/Chest: Breath sounds normal. No respiratory distress. She has no wheezes.  Increased cough with forced expiration.   Abdominal: Soft. Bowel sounds are normal. There is no tenderness.  Musculoskeletal: She exhibits no edema or tenderness.  Lymphadenopathy:    She has no cervical adenopathy.  Skin: No rash noted. No erythema.  Psychiatric: She has a normal mood and affect. Her behavior is normal.    BP 138/74   Pulse 89   Temp 97.7 F (36.5 C) (Oral)   Ht  '5\' 3"'$  (1.6 m)   Wt 186 lb 9.6 oz (84.6 kg)   SpO2 98%   BMI 33.05 kg/m  Wt Readings from Last 3 Encounters:  04/02/17 186 lb 9.6 oz (84.6 kg)  03/20/17 180 lb (81.6 kg)  02/19/17 188 lb 9.6 oz (85.5 kg)     Lab Results  Component Value Date   WBC 5.3 04/02/2017   HGB 13.9 04/02/2017   HCT 41.7 04/02/2017   PLT 241.0 04/02/2017   GLUCOSE 124 (H) 03/20/2017   CHOL 203 (H) 09/10/2016   TRIG 74.0 09/10/2016   HDL 73.00 09/10/2016   LDLCALC 115 (H)  09/10/2016   ALT 28 09/10/2016   AST 23 09/10/2016   NA 135 03/20/2017   K 3.7 03/20/2017   CL 101 03/20/2017   CREATININE 0.85 03/20/2017   BUN 13 03/20/2017   CO2 25 03/20/2017   TSH 2.50 09/10/2016    Dg Chest 2 View  Result Date: 03/20/2017 CLINICAL DATA:  Severe headache EXAM: CHEST  2 VIEW COMPARISON:  04/07/2016 FINDINGS: The heart size and mediastinal contours are within normal limits. Both lungs are clear. Degenerative changes of the spine. IMPRESSION: No active cardiopulmonary disease. Electronically Signed   By: Donavan Foil M.D.   On: 03/20/2017 15:56       Assessment & Plan:   Problem List Items Addressed This Visit    Asthma    Is followed by pulmonary.  With persistent cough as outlined.  Treat with abx and prednisone as outlined.  Use inhalers as directed.  Follow.       Relevant Medications   predniSONE (DELTASONE) 10 MG tablet   Headache    Headache as outlined.  Some neck issues as outlined.  No neck rigidity.  Treat infection as outlined.  Check esr.  Discussed further evaluation, including MRI.  Will treat infection - abx, prednisone as outlined.  Given persistent headache and light headedness with headache not being similar to her previous migraines, will schedule MRI.        Relevant Orders   MR Brain W Wo Contrast   CBC with Differential/Platelet (Completed)   Sedimentation rate (Completed)   URI (upper respiratory infection)    Persistent cough, congestion and headache as outlined.  Exam as  outlined.  Treat with zpak as directed.  Saline nasal spray and nasacort nasal spray as outlned.  mucinex and  Inhalers.  Prednisone taper as directed.  Rest.  Fluids.  Follow.  Call with update.        Relevant Medications   azithromycin (ZITHROMAX) 250 MG tablet       Einar Pheasant, MD

## 2017-04-02 NOTE — Patient Instructions (Addendum)
Nasacort nasal spray - 2 sprays each nostril one time per day.  Do this in the evening.    Continue saline flushes  Continue inhalers.    Take the probiotic as we discussed.

## 2017-04-02 NOTE — Progress Notes (Signed)
Pre visit review using our clinic review tool, if applicable. No additional management support is needed unless otherwise documented below in the visit note. 

## 2017-04-05 ENCOUNTER — Encounter: Payer: Self-pay | Admitting: Internal Medicine

## 2017-04-05 NOTE — Assessment & Plan Note (Signed)
Persistent cough, congestion and headache as outlined.  Exam as outlined.  Treat with zpak as directed.  Saline nasal spray and nasacort nasal spray as outlned.  mucinex and  Inhalers.  Prednisone taper as directed.  Rest.  Fluids.  Follow.  Call with update.

## 2017-04-05 NOTE — Assessment & Plan Note (Signed)
Is followed by pulmonary.  With persistent cough as outlined.  Treat with abx and prednisone as outlined.  Use inhalers as directed.  Follow.

## 2017-04-05 NOTE — Assessment & Plan Note (Addendum)
Headache as outlined.  Some neck issues as outlined.  No neck rigidity.  Treat infection as outlined.  Check esr.  Discussed further evaluation, including MRI.  Will treat infection - abx, prednisone as outlined.  Given persistent headache and light headedness with headache not being similar to her previous migraines, will schedule MRI.

## 2017-04-07 ENCOUNTER — Ambulatory Visit: Payer: BLUE CROSS/BLUE SHIELD | Admitting: Internal Medicine

## 2017-04-09 ENCOUNTER — Telehealth: Payer: Self-pay | Admitting: Internal Medicine

## 2017-04-09 NOTE — Telephone Encounter (Signed)
Copied from CRM [phone]#[id number]. Topic: Quick Communication - See Telephone Encounter >> Apr 09, 2017 12:55 PM Cipriano BunkerLambe, Annette S wrote: CRM for notification. See Telephone encounter for:   Myrene Buddylvia F [employer] [phone] option 2 and option 3 and [facility] if there is questions.   Pt. Rescheduled MRI and [facility] [place], needing order sent to them.   04/09/17.

## 2017-04-09 NOTE — Telephone Encounter (Signed)
Provider may need to reorder MRI. Please advise.

## 2017-04-10 ENCOUNTER — Telehealth: Payer: Self-pay

## 2017-04-10 NOTE — Telephone Encounter (Signed)
Copied from CRM [phone]#[id number]. Topic: Quick Communication - See Telephone Encounter >> Apr 09, 2017 12:55 PM Cipriano BunkerLambe, Annette S wrote: CRM for notification. See Telephone encounter for:   Savannah Gomez [employer] [phone] option 2 and option 3 and [facility] to [facility] to [facility] if there is questions.  Pt. Rescheduled MRI and [facility] [place], needing order sent to them.   04/09/17. >> Apr 10, 2017 12:53 PM Jonette EvaBarksdale, Harvey B wrote: Savannah Gomez wants to give fax number b/c office has not received an order/ fax: [phone]

## 2017-04-10 NOTE — Telephone Encounter (Signed)
Not sure if this MRI needs to be reordered?

## 2017-04-10 NOTE — Telephone Encounter (Signed)
Called Ms Savannah Gomez and she has [facility] [place] [facility] [place] insurance request and stated was cheaper.  What do I need to do for a "new order".  Just let me know.

## 2017-04-13 ENCOUNTER — Ambulatory Visit: Payer: BLUE CROSS/BLUE SHIELD

## 2017-04-13 NOTE — Telephone Encounter (Signed)
Thank you :)

## 2017-04-13 NOTE — Telephone Encounter (Signed)
Nothing I will send order to them.

## 2017-04-14 NOTE — Telephone Encounter (Signed)
Is this something that you can help with?  

## 2017-04-15 ENCOUNTER — Telehealth: Payer: Self-pay | Admitting: Internal Medicine

## 2017-04-15 NOTE — Telephone Encounter (Signed)
Please advise 

## 2017-04-15 NOTE — Telephone Encounter (Signed)
Copied from CRM [phone]. Topic: Quick Communication - See Telephone Encounter >> Apr 15, 2017 10:15 AM Guinevere FerrariMorris, Edithe Dobbin E, NT wrote: CRM for notification. See Telephone encounter for: Carollee HerterShannon is calling to get orders for MRI of the brain with and without contrast.  Fax number is [phone]  04/15/17.

## 2017-04-15 NOTE — Telephone Encounter (Signed)
faxed

## 2017-04-23 ENCOUNTER — Telehealth: Payer: Self-pay | Admitting: Internal Medicine

## 2017-04-23 NOTE — Telephone Encounter (Signed)
Copied from CRM [phone]. Topic: Quick Communication - See Telephone Encounter >> Apr 23, 2017 10:27 AM Rudi CocoLathan, Simcha Farrington M, NT wrote: CRM for notification. See Telephone encounter for:   04/23/17. Pt. Calling to see if her results are back from her mri. Pt. Can be reached at [phone]

## 2017-04-27 NOTE — Telephone Encounter (Signed)
Pt [facility]. She would like to see if the nurse or doctor can call [facility] and see why the results are not in Epic yet. Please advise.

## 2017-04-27 NOTE — Telephone Encounter (Signed)
Patient is aware 

## 2017-04-27 NOTE — Telephone Encounter (Signed)
[facility] message for [facility] to fax [facility] results.

## 2017-04-29 NOTE — Telephone Encounter (Signed)
[facility]athy [facility] called and stated patient has not had an MRI that they are with Epic and results would be also in chart. Patient did have chest X-ray on 03/20/17 at that location.

## 2017-04-30 NOTE — Telephone Encounter (Signed)
Called [facility] and left a message for them to call our office regarding this patient.

## 2017-04-30 NOTE — Telephone Encounter (Signed)
[place] [place] [facility] and received [facility].

## 2017-05-04 NOTE — Telephone Encounter (Signed)
Copied from CRM [id number]. Topic: Quick Communication - See Telephone Encounter >> Apr 23, 2017 10:27 AM Rudi CocoLathan, Latoya M, NT wrote: CRM for notification. See Telephone encounter for:   04/23/17. Pt. Calling to see if her results are back from her mri. Pt. Can be reached at [phone] >> May 04, 2017 11:30 AM Percival SpanishKennedy, Cheryl W wrote:  Pt had a MRI on 04/16/17 anf still has not heard about her results and is asking to  Be called back today with the results   [phone]

## 2017-05-05 NOTE — Telephone Encounter (Signed)
Tried to call pt.  Unable to leave a message on her cell phone.  Left message on her home phone.  MRI interpretation - normal MRI of the brain for age.  Some thickening in the sinuses.  Notify her MRI ok.  Is she doing ok?

## 2017-05-05 NOTE — Telephone Encounter (Signed)
You have her MRI results. Let me know if I need to call her.

## 2017-05-05 NOTE — Telephone Encounter (Signed)
Patient stated that she is doing ok, still has a little cough but feels better overall. Patient is aware of MRI results and says thank you for following up.

## 2017-05-05 NOTE — Telephone Encounter (Signed)
Noted.  If any persistent problems or feel needs to be seen let me know (or if cough persist).

## 2017-07-28 ENCOUNTER — Ambulatory Visit (INDEPENDENT_AMBULATORY_CARE_PROVIDER_SITE_OTHER): Payer: BLUE CROSS/BLUE SHIELD | Admitting: Internal Medicine

## 2017-07-28 ENCOUNTER — Encounter: Payer: Self-pay | Admitting: Internal Medicine

## 2017-07-28 ENCOUNTER — Telehealth: Payer: Self-pay | Admitting: Internal Medicine

## 2017-07-28 VITALS — BP 142/80 | HR 80 | Ht 63.0 in | Wt 192.0 lb

## 2017-07-28 DIAGNOSIS — J4520 Mild intermittent asthma, uncomplicated: Secondary | ICD-10-CM

## 2017-07-28 MED ORDER — FLUTICASONE PROPIONATE HFA 110 MCG/ACT IN AERO: 2.0000 | INHALATION_SPRAY | Freq: Two times a day (BID) | RESPIRATORY_TRACT | 2 refills | Status: DC

## 2017-07-28 NOTE — Patient Instructions (Addendum)
Change FLOVENT POWDER TO FLOVENT HFA ALBUTEROL AS NEEDED  CONTINUE ZYRTEC CONTINUE DEXILANT

## 2017-07-28 NOTE — Telephone Encounter (Signed)
Pt calling stating she was just here and we started her in Albuterol   But her pharmacy called her since she is self pay to let her know it would be  $ [phone] She is calling to see if there is an alternative she can take since she is self pay.  Please advise

## 2017-07-28 NOTE — Telephone Encounter (Signed)
Pt informed to call pharmacy and call back with medication. Nothing further needed.

## 2017-07-28 NOTE — Telephone Encounter (Signed)
Please advise on message below. Pt is a self pay.

## 2017-07-28 NOTE — Telephone Encounter (Signed)
She can ask pharmacy what other types of ALbuterol inhalers she can use that least expensive

## 2017-07-28 NOTE — Progress Notes (Signed)
[facility] Pulmonary Medicine Consultation      MRN# [id number] [name] Oct 03, 1955   CC  Follow up asthma   Brief History: 62 yo with Hx of Asthma (positive MCH), stopped meds, and presented back to [facility] with chronic cough, now on Arnuity with significant improvement.  Dx with ASTHMA 15 years ago Pneumonia 2 years ago Triggers-pollen, cats,dogs  HPI Doing well overall, no SOB, no cough and no wheezing Well controlled at this time Last albuterol use was 2 months ago No signs of infection No signs of exacerbation +GRED under control +alerghic rhinitis seems to be controlled with zyrtec  Patient has hoarse voice and itchy throat she uses Flovent POWER form at this time Will swicth forms and see if this helps her symptoms   Current Outpatient Medications:  .  albuterol (PROVENTIL HFA;VENTOLIN HFA) 108 (90 Base) MCG/ACT inhaler, Inhale 2 puffs into the lungs every 6 (six) hours as needed for wheezing or shortness of breath., Disp: 1 Inhaler, Rfl: 5 .  calcium carbonate (CALCIUM 600) 600 MG TABS tablet, Take 600 mg by mouth daily with breakfast., Disp: , Rfl:  .  cetirizine (ZYRTEC) 10 MG tablet, Take 10 mg by mouth daily., Disp: , Rfl:  .  dexlansoprazole (DEXILANT) 60 MG capsule, Take 1 capsule (60 mg total) by mouth daily., Disp: 30 capsule, Rfl: 5 .  diltiazem (CARDIZEM CD) 120 MG 24 hr capsule, , Disp: , Rfl:  .  Docusate Calcium (STOOL SOFTENER PO), Take by mouth daily., Disp: , Rfl:  .  fluticasone (FLONASE) 50 MCG/ACT nasal spray, Place 1 spray into the nose daily as needed., Disp: , Rfl:  .  Fluticasone Furoate (ARNUITY ELLIPTA) 100 MCG/ACT AEPB, Inhale 1 puff into the lungs daily., Disp: 30 each, Rfl: 5 .  imipramine (TOFRANIL) 25 MG tablet, Take 25 mg by mouth 2 (two) times daily., Disp: , Rfl:  .  metoprolol succinate (TOPROL-XL) 25 MG 24 hr tablet, Take 0.5 tablets (12.5 mg total) by mouth daily. (Patient taking differently: Take 25 mg by mouth daily. ),  Disp: 15 tablet, Rfl: 5 .  mirabegron ER (MYRBETRIQ) 25 MG TB24, Take 25 mg by mouth 2 (two) times daily., Disp: , Rfl:  .  ondansetron (ZOFRAN ODT) 4 MG disintegrating tablet, Take 1 tablet (4 mg total) by mouth every 8 (eight) hours as needed for nausea or vomiting., Disp: 15 tablet, Rfl: 0 .  topiramate (TOPAMAX) 50 MG tablet, Take 50 mg by mouth 2 (two) times daily., Disp: , Rfl:    Review of Systems  Constitutional: Negative for chills and fever.  HENT: Negative for congestion.   Eyes: Negative for blurred vision and double vision.  Respiratory: Negative for cough, hemoptysis, sputum production, shortness of breath and wheezing.   Cardiovascular: Negative for chest pain.  Gastrointestinal: Negative for heartburn and nausea.  Skin: Negative for rash.      Allergies:  Demerol [meperidine]  BP (!) 142/80 (BP Location: Left Arm, Cuff Size: Normal)   Pulse 80   Ht  (1.6 m)   Wt 192 lb (87.1 kg)   SpO2 99%   BMI 34.01 kg/m   Physical Examination:  General Appearance: No distress  HEENT: PERRLA, no ptosis, no other lesions noticed Pulmonary:normal breath sounds., diaphragmatic excursion normal.No wheezing, No rales   Cardiovascular:  Normal S1,S2.  No m/r/g.      Pulmonary function test 12/11/2015 FEV1 92% FEV1/FVC 80% RV 106% TLC 102% DLCO corrected and her 42% Impression: Nonobstructive process  by spirometry, no significant response to bronchodilators. Mild scooping of end expiratory curve, consistent with a mild obstructive process. Overall fits the clinical diagnosis of asthma.  6 minute walk test total distance 1240 feet/378 m low saturation 99%, highest heart rate 78    Assessment and Plan:  62 year old female with mild intermittent well controlled ASTHMA  With well controlled allergic rhinitis with underlying GERD Asthma Asthma - mild intermittent Will switch FLOVENT POWDER TO HFA FORM TO SEE IF THIS WOULD IMPROVE HOARSE VOICE -avoid triggers -continue  Zyrtec -continuePPI Patient to be given flu shot today   Follow up in 3 months  Patient are satisfied with Plan of action and management. All questions answered  [name], M.D.  [facility]  Medical Director [facility] Medical Director [facility] Cardio-Pulmonary Department

## 2017-08-25 ENCOUNTER — Encounter: Payer: Self-pay | Admitting: Internal Medicine

## 2017-08-25 ENCOUNTER — Ambulatory Visit (INDEPENDENT_AMBULATORY_CARE_PROVIDER_SITE_OTHER): Payer: BLUE CROSS/BLUE SHIELD | Admitting: Internal Medicine

## 2017-08-25 VITALS — BP 118/76 | HR 78 | Temp 97.8°F | Resp 18 | Ht 63.0 in | Wt 190.6 lb

## 2017-08-25 DIAGNOSIS — Z1239 Encounter for other screening for malignant neoplasm of breast: Secondary | ICD-10-CM

## 2017-08-25 DIAGNOSIS — R739 Hyperglycemia, unspecified: Secondary | ICD-10-CM

## 2017-08-25 DIAGNOSIS — K219 Gastro-esophageal reflux disease without esophagitis: Secondary | ICD-10-CM | POA: Diagnosis not present

## 2017-08-25 DIAGNOSIS — Z0000 Encounter for general adult medical examination without abnormal findings: Secondary | ICD-10-CM

## 2017-08-25 DIAGNOSIS — J4520 Mild intermittent asthma, uncomplicated: Secondary | ICD-10-CM | POA: Diagnosis not present

## 2017-08-25 DIAGNOSIS — Z1231 Encounter for screening mammogram for malignant neoplasm of breast: Secondary | ICD-10-CM | POA: Diagnosis not present

## 2017-08-25 DIAGNOSIS — M79645 Pain in left finger(s): Secondary | ICD-10-CM

## 2017-08-25 DIAGNOSIS — E7800 Pure hypercholesterolemia, unspecified: Secondary | ICD-10-CM

## 2017-08-25 DIAGNOSIS — R51 Headache: Secondary | ICD-10-CM | POA: Diagnosis not present

## 2017-08-25 DIAGNOSIS — R519 Headache, unspecified: Secondary | ICD-10-CM

## 2017-08-25 MED ORDER — TOPIRAMATE 25 MG PO TABS: 25.0000 mg | ORAL_TABLET | Freq: Two times a day (BID) | ORAL | 2 refills | Status: DC

## 2017-08-25 MED ORDER — PANTOPRAZOLE SODIUM 40 MG PO TBEC: 40.0000 mg | DELAYED_RELEASE_TABLET | Freq: Every day | ORAL | 3 refills | Status: DC

## 2017-08-25 NOTE — Assessment & Plan Note (Signed)
Physical today 08/25/17.  Schedule mammogram.  Colonoscopy 12/13/13.

## 2017-08-25 NOTE — Progress Notes (Signed)
Patient ID: Savannah Gomez, female   DOB: 26-Dec-1955, 62 y.o.   MRN: [id number]   Subjective:    Patient ID: Savannah Gomez, female    DOB: [birth year], 62 y.o.   MRN: [id number]  HPI  Patient here for her physical exam.  Just saw Dr Belia Heman for f/u asthma.  Changed flovent to HFA to help with hoarseness.  Currently stable.  Does not feel omeprazole is helping control acid reflux as well.  Has noticed some increased belching.  Taking otc omeprazole 10mg  (2/day).   Also taking zantac in the evening.  No chest pain.  No abdominal pain.  Bowels moving.  Increased pain, swelling and stiffness in her left first (and some in second) finger.  Unable to bend the finger.  Taking tylenol.     Past Medical History:  Diagnosis Date  . Allergy   . Arthritis   . Asthma   . Chicken pox   . GERD (gastroesophageal reflux disease)   . Migraine headache   . Urine incontinence    Past Surgical History:  Procedure Laterality Date  . BREAST CYST ASPIRATION Bilateral   . carpal tunnel sugery     bilateral  . ELBOW SURGERY  1999  . HAND SURGERY  1999   broken finger - pins  . NASAL SINUS SURGERY  1992  . ROTATOR CUFF REPAIR  1994  . Trigger finger surgery  1999 and 2000  . VAGINAL HYSTERECTOMY  1987   secondary to fibroids   Family History  Problem Relation Age of Onset  . Colon cancer Mother        colon  . Asthma Mother   . Heart disease Father   . Hypertension Father   . Colon cancer Sister   . Stroke Maternal Grandfather   . Diabetes Maternal Grandfather   . Lung cancer Maternal Grandmother   . Breast cancer Maternal Grandmother    Social History   Socioeconomic History  . Marital status: Married    Spouse name: Not on file  . Number of children: Not on file  . Years of education: Not on file  . Highest education level: Not on file  Occupational History  . Not on file  Social Needs  . Financial resource strain: Not on file  . Food insecurity:    Worry: Not on file   Inability: Not on file  . Transportation needs:    Medical: Not on file    Non-medical: Not on file  Tobacco Use  . Smoking status: Never Smoker  . Smokeless tobacco: Never Used  Substance and Sexual Activity  . Alcohol use: No    Alcohol/week: 0.0 oz  . Drug use: No  . Sexual activity: Not on file  Lifestyle  . Physical activity:    Days per week: Not on file    Minutes per session: Not on file  . Stress: Not on file  Relationships  . Social connections:    Talks on phone: Not on file    Gets together: Not on file    Attends religious service: Not on file    Active member of club or organization: Not on file    Attends meetings of clubs or organizations: Not on file    Relationship status: Not on file  Other Topics Concern  . Not on file  Social History Narrative  . Not on file    Outpatient Encounter Medications as of 08/25/2017  Medication Sig  . albuterol (PROVENTIL HFA;VENTOLIN HFA)  108 (90 Base) MCG/ACT inhaler Inhale 2 puffs into the lungs every 6 (six) hours as needed for wheezing or shortness of breath.  . calcium carbonate (CALCIUM 600) 600 MG TABS tablet Take 600 mg by mouth daily with breakfast.  . cetirizine (ZYRTEC) 10 MG tablet Take 10 mg by mouth daily.  Marland Kitchen dexlansoprazole (DEXILANT) 60 MG capsule Take 1 capsule (60 mg total) by mouth daily.  Marland Kitchen diltiazem (CARDIZEM CD) 120 MG 24 hr capsule   . Docusate Calcium (STOOL SOFTENER PO) Take by mouth daily.  . fluticasone (FLONASE) 50 MCG/ACT nasal spray Place 1 spray into the nose daily as needed.  . fluticasone (FLOVENT HFA) 110 MCG/ACT inhaler Inhale 2 puffs into the lungs 2 (two) times daily.  . Fluticasone Furoate (ARNUITY ELLIPTA) 100 MCG/ACT AEPB Inhale 1 puff into the lungs daily.  Marland Kitchen imipramine (TOFRANIL) 25 MG tablet Take 25 mg by mouth 2 (two) times daily.  . metoprolol succinate (TOPROL-XL) 25 MG 24 hr tablet Take 0.5 tablets (12.5 mg total) by mouth daily. (Patient taking differently: Take 25 mg by mouth  daily. )  . mirabegron ER (MYRBETRIQ) 25 MG TB24 Take 25 mg by mouth 2 (two) times daily.  . ondansetron (ZOFRAN ODT) 4 MG disintegrating tablet Take 1 tablet (4 mg total) by mouth every 8 (eight) hours as needed for nausea or vomiting.  . [DISCONTINUED] topiramate (TOPAMAX) 50 MG tablet Take 50 mg by mouth 2 (two) times daily.  . pantoprazole (PROTONIX) 40 MG tablet Take 1 tablet (40 mg total) by mouth daily.  Marland Kitchen topiramate (TOPAMAX) 25 MG tablet Take 1 tablet (25 mg total) by mouth 2 (two) times daily.   No facility-administered encounter medications on file as of 08/25/2017.     Review of Systems  Constitutional: Negative for appetite change and unexpected weight change.  HENT: Negative for congestion and sinus pressure.   Eyes: Negative for pain and visual disturbance.  Respiratory: Negative for cough and chest tightness.        Breathing stable.    Cardiovascular: Negative for chest pain, palpitations and leg swelling.  Gastrointestinal: Negative for abdominal pain, diarrhea and nausea.       Belching and acid reflux as outlined.    Genitourinary: Negative for difficulty urinating and dysuria.  Musculoskeletal: Negative for myalgias.       Left first finger - stiffness and swelling.    Skin: Negative for color change and rash.  Neurological: Negative for dizziness, light-headedness and headaches.  Hematological: Negative for adenopathy. Does not bruise/bleed easily.  Psychiatric/Behavioral: Negative for agitation and dysphoric mood.       Objective:     Blood pressure rechecked by me:  128/72  Physical Exam  Constitutional: She is oriented to person, place, and time. She appears well-developed and well-nourished. No distress.  HENT:  Nose: Nose normal.  Mouth/Throat: Oropharynx is clear and moist.  Eyes: Right eye exhibits no discharge. Left eye exhibits no discharge. No scleral icterus.  Neck: Neck supple. No thyromegaly present.  Cardiovascular: Normal rate and regular  rhythm.  Pulmonary/Chest: Breath sounds normal. No accessory muscle usage. No tachypnea. No respiratory distress. She has no decreased breath sounds. She has no wheezes. She has no rhonchi. Right breast exhibits no inverted nipple, no mass, no nipple discharge and no tenderness (no axillary adenopathy). Left breast exhibits no inverted nipple, no mass, no nipple discharge and no tenderness (no axilarry adenopathy).  Abdominal: Soft. Bowel sounds are normal. There is no tenderness.  Musculoskeletal: She  exhibits no edema or tenderness.  Increased swelling left first finger.  No increased erythema.  Decreased rom.    Lymphadenopathy:    She has no cervical adenopathy.  Neurological: She is alert and oriented to person, place, and time.  Skin: No rash noted. No erythema.  Psychiatric: She has a normal mood and affect. Her behavior is normal.    BP 118/76 (BP Location: Left Arm, Patient Position: Sitting, Cuff Size: Large)   Pulse 78   Temp 97.8 F (36.6 C) (Oral)   Resp 18   Ht 5\' 3"  (1.6 m)   Wt 190 lb 9.6 oz (86.5 kg)   SpO2 99%   BMI 33.76 kg/m  Wt Readings from Last 3 Encounters:  08/25/17 190 lb 9.6 oz (86.5 kg)  07/28/17 192 lb (87.1 kg)  04/02/17 186 lb 9.6 oz (84.6 kg)     Lab Results  Component Value Date   WBC 5.3 04/02/2017   HGB 13.9 04/02/2017   HCT 41.7 04/02/2017   PLT 241.0 04/02/2017   GLUCOSE 124 (H) 03/20/2017   CHOL 203 (H) 09/10/2016   TRIG 74.0 09/10/2016   HDL 73.00 09/10/2016   LDLCALC 115 (H) 09/10/2016   ALT 28 09/10/2016   AST 23 09/10/2016   NA 135 03/20/2017   K 3.7 03/20/2017   CL 101 03/20/2017   CREATININE 0.85 03/20/2017   BUN 13 03/20/2017   CO2 25 03/20/2017   TSH 2.50 09/10/2016    Dg Chest 2 View  Result Date: 03/20/2017 CLINICAL DATA:  Severe headache EXAM: CHEST  2 VIEW COMPARISON:  04/07/2016 FINDINGS: The heart size and mediastinal contours are within normal limits. Both lungs are clear. Degenerative changes of the spine.  IMPRESSION: No active cardiopulmonary disease. Electronically Signed   By: Jasmine Pang M.D.   On: 03/20/2017 15:56       Assessment & Plan:   Problem List Items Addressed This Visit    Asthma    Is followed by pulmonary.  On flovent.  Breathing stable.  Follow.       Finger pain, left    Persistent pain, stiffness and swelling.  Unable to take antiinflammatories.  Refer to Dr Gavin Potters for evaluation and further treatment.        Relevant Orders   Ambulatory referral to Rheumatology   GERD (gastroesophageal reflux disease)    On otc omeprazole.  Not working.  Start protonix 40mg  q am.  Continue zantac in the evening.  Follow.  Has seen GI.       Relevant Medications   pantoprazole (PROTONIX) 40 MG tablet   Headache    Headache at last visit has resolved.  MRI unrevealing.  Now having some recurring issues with her migraine headaches.   Has seen Dr Sherryll Burger.  Was on topamax for while.  Helped.  Off and had been doing well.  Would like to restart.  Restart topamax 25mg  bid and titrate up if needed.  Follow.       Relevant Medications   topiramate (TOPAMAX) 25 MG tablet   Health care maintenance    Physical today 08/25/17.  Schedule mammogram.  Colonoscopy 12/13/13.        Hypercholesteremia    Low cholesterol diet and exercise.  Follow lipid panel.        Relevant Orders   Hepatic function panel   Lipid panel   TSH   Basic metabolic panel    Other Visit Diagnoses    Breast cancer screening    -  Primary   Relevant Orders   MM 3D SCREEN BREAST BILATERAL   Routine general medical examination at a health care facility       Hyperglycemia       Relevant Orders   Hemoglobin A1c       Dale DurhamSCOTT, Annalynne Ibanez, MD

## 2017-08-27 ENCOUNTER — Encounter: Payer: Self-pay | Admitting: Internal Medicine

## 2017-08-27 DIAGNOSIS — M79645 Pain in left finger(s): Secondary | ICD-10-CM | POA: Insufficient documentation

## 2017-08-27 NOTE — Assessment & Plan Note (Signed)
On otc omeprazole.  Not working.  Start protonix 40mg  q am.  Continue zantac in the evening.  Follow.  Has seen GI.

## 2017-08-27 NOTE — Assessment & Plan Note (Signed)
Persistent pain, stiffness and swelling.  Unable to take antiinflammatories.  Refer to Dr [facility] for evaluation and further treatment.

## 2017-08-27 NOTE — Assessment & Plan Note (Signed)
Is followed by pulmonary.  On flovent.  Breathing stable.  Follow.

## 2017-08-27 NOTE — Assessment & Plan Note (Signed)
Headache at last visit has resolved.  MRI unrevealing.  Now having some recurring issues with her migraine headaches.   Has seen Dr Sherryll BurgerShah.  Was on topamax for while.  Helped.  Off and had been doing well.  Would like to restart.  Restart topamax 25mg  bid and titrate up if needed.  Follow.

## 2017-08-27 NOTE — Assessment & Plan Note (Signed)
Low cholesterol diet and exercise.  Follow lipid panel.   

## 2017-09-08 ENCOUNTER — Other Ambulatory Visit: Payer: Self-pay

## 2017-09-08 ENCOUNTER — Ambulatory Visit: Payer: BLUE CROSS/BLUE SHIELD | Attending: Rheumatology | Admitting: Occupational Therapy

## 2017-09-08 ENCOUNTER — Encounter: Payer: Self-pay | Admitting: Occupational Therapy

## 2017-09-08 DIAGNOSIS — M25642 Stiffness of left hand, not elsewhere classified: Secondary | ICD-10-CM

## 2017-09-08 DIAGNOSIS — M6281 Muscle weakness (generalized): Secondary | ICD-10-CM

## 2017-09-08 DIAGNOSIS — M25542 Pain in joints of left hand: Secondary | ICD-10-CM

## 2017-09-12 NOTE — Therapy (Signed)
[facility] [address] [place], [address] Phone: [phone]   Fax:  [phone]  Occupational Therapy Evaluation  Patient Details  Name: Savannah Gomez MRN: [id number] Date of Birth: 1956-02-28 Referring Provider: Lavenia Gomez   Encounter Date: 09/08/2017    Past Medical History:  Diagnosis Date  . Allergy   . Arthritis   . Asthma   . Chicken pox   . GERD (gastroesophageal reflux disease)   . Migraine headache   . Urine incontinence     Past Surgical History:  Procedure Laterality Date  . BREAST CYST ASPIRATION Bilateral   . carpal tunnel sugery     bilateral  . ELBOW SURGERY  1999  . HAND SURGERY  1999   broken finger - pins  . NASAL SINUS SURGERY  1992  . ROTATOR CUFF REPAIR  1994  . Trigger finger surgery  1999 and 2000  . VAGINAL HYSTERECTOMY  1987   secondary to fibroids    There were no vitals filed for this visit.     [facility] OT Assessment - 09/12/17 1720      Assessment   Referring Provider  Savannah Gomez    Hand Dominance  Right      Balance Screen   Has the patient fallen in the past 6 months  No    Has the patient had a decrease in activity level because of a fear of falling?   No    Is the patient reluctant to leave their home because of a fear of falling?   No      Home  Environment   Family/patient expects to be discharged to:  Private residence    Living Arrangements  Spouse/significant other    Available Help at Discharge  Family    Type of Home  House    Home Access  Stairs    Home Layout  One level    Alternate Level Stairs - Number of Steps  5 to enter home    Bathroom Shower/Tub  Walk-in Shower    Lives With  Spouse      Prior Function   Level of Independence  Independent    Vocation  Retired    Leisure  helps to take care of parents, reading, spending time with family and grandchildren.      ADL   ADL comments  assist at times to put on jewelry  or bracelets, difficulty with buttons if they are on the back.       IADL   Prior Level of Function Shopping  independent    Shopping  Shops independently for small purchases    Prior Level of Function Light Housekeeping  independent    Light Housekeeping  Maintains house alone or with occasional assistance    Prior Level of Function Meal Prep  independet    Meal Prep  Plans, prepares and serves adequate meals independently    Medication Management  Is responsible for taking medication in correct dosages at correct time      Hand Function   Right Hand Grip (lbs)  40    Right Hand Lateral Pinch  7 lbs    Right Hand 3 Point Pinch  7 lbs    Left Hand Grip (lbs)  30    Left Hand Lateral Pinch  6 lbs    Left 3 point pinch  6 lbs  Patient will benefit from skilled therapeutic intervention in order to improve the following deficits and impairments:     Visit Diagnosis: Pain in joint of left hand  Stiffness of left hand, not elsewhere classified  Muscle weakness (generalized)    Problem List Patient Active Problem List   Diagnosis Date Noted  . Finger pain, left 08/27/2017  . Headache 04/02/2017  . Sinusitis 02/22/2017  . Muscle spasm 12/05/2015  . Asthma 10/17/2015  . Dyspnea 10/17/2015  . Cough 03/06/2015  . CAP (community acquired pneumonia) 02/14/2015  . Family history of colon cancer 11/07/2014  . Abnormal mammogram 11/04/2014  . Health care maintenance 05/07/2014  . Fatigue 02/23/2013  . History of migraine headaches 01/18/2012  . Interstitial cystitis 01/18/2012  . Hypercholesteremia 01/18/2012  . GERD (gastroesophageal reflux disease) 01/18/2012   Savannah Gomez, OTR/L, CLT  Savannah Gomez 09/12/2017, 5:20 PM  [facility] [address] [place], [place], [id number] Phone: [phone]   Fax:  [phone]  Name: Savannah NeighborsCynthia H Gomez MRN:  [id number] Date of Birth: Jun 06, 1955

## 2017-09-14 ENCOUNTER — Other Ambulatory Visit (INDEPENDENT_AMBULATORY_CARE_PROVIDER_SITE_OTHER): Payer: BLUE CROSS/BLUE SHIELD

## 2017-09-14 DIAGNOSIS — R739 Hyperglycemia, unspecified: Secondary | ICD-10-CM | POA: Diagnosis not present

## 2017-09-14 DIAGNOSIS — E7800 Pure hypercholesterolemia, unspecified: Secondary | ICD-10-CM

## 2017-09-14 LAB — LIPID PANEL
CHOLESTEROL: 175 mg/dL (ref 0–200)
HDL: 78.6 mg/dL (ref >39.00)
LDL CALC: 87 mg/dL (ref 0–99)
NonHDL: 96.4
TRIGLYCERIDES: 45 mg/dL (ref 0.0–149.0)
Total CHOL/HDL Ratio: 2
VLDL: 9 mg/dL (ref 0.0–40.0)

## 2017-09-14 LAB — BASIC METABOLIC PANEL
BUN: 14 mg/dL (ref 6–23)
CHLORIDE: 108 meq/L (ref 96–112)
CO2: 25 meq/L (ref 19–32)
CREATININE: 1.02 mg/dL (ref 0.40–1.20)
Calcium: 9.4 mg/dL (ref 8.4–10.5)
GFR: 58.14 mL/min — ABNORMAL LOW (ref >60.00)
Glucose, Bld: 103 mg/dL — ABNORMAL HIGH (ref 70–99)
POTASSIUM: 4.2 meq/L (ref 3.5–5.1)
Sodium: 140 mEq/L (ref 135–145)

## 2017-09-14 LAB — HEPATIC FUNCTION PANEL
ALBUMIN: 4.2 g/dL (ref 3.5–5.2)
ALT: 18 U/L (ref 0–35)
AST: 15 U/L (ref 0–37)
Alkaline Phosphatase: 114 U/L (ref 39–117)
BILIRUBIN TOTAL: 0.4 mg/dL (ref 0.2–1.2)
Bilirubin, Direct: 0.1 mg/dL (ref 0.0–0.3)
TOTAL PROTEIN: 6.6 g/dL (ref 6.0–8.3)

## 2017-09-14 LAB — TSH: TSH: 2.29 u[IU]/mL (ref 0.35–4.50)

## 2017-09-14 LAB — HEMOGLOBIN A1C: HEMOGLOBIN A1C: 5.7 % (ref 4.6–6.5)

## 2017-09-15 ENCOUNTER — Encounter: Payer: Self-pay | Admitting: Internal Medicine

## 2017-09-16 ENCOUNTER — Ambulatory Visit: Payer: BLUE CROSS/BLUE SHIELD | Admitting: Occupational Therapy

## 2017-09-21 ENCOUNTER — Ambulatory Visit: Payer: BLUE CROSS/BLUE SHIELD | Admitting: Occupational Therapy

## 2017-09-28 ENCOUNTER — Ambulatory Visit
Admission: RE | Admit: 2017-09-28 | Discharge: 2017-09-28 | Disposition: A | Discharge status: Home / Self Care | Payer: BLUE CROSS/BLUE SHIELD | Source: Ambulatory Visit | Attending: Internal Medicine | Admitting: Internal Medicine

## 2017-09-28 DIAGNOSIS — Z1231 Encounter for screening mammogram for malignant neoplasm of breast: Secondary | ICD-10-CM | POA: Diagnosis present

## 2017-09-28 DIAGNOSIS — Z1239 Encounter for other screening for malignant neoplasm of breast: Secondary | ICD-10-CM

## 2017-09-28 NOTE — Therapy (Signed)
[facility] PHYSICAL AND SPORTS MEDICINE [address] [place], [place], [address] Phone: [phone]   Fax:  [phone]  Occupational Therapy Evaluation  Patient Details  Name: Savannah NeighborsCynthia H Gomez MRN: [id number] Date of Birth: Aug 23, 1955 Referring Provider: Bevelyn BucklesKernodle, GW   Encounter Date: 09/08/2017  OT End of Session - 09/28/17 0907    Visit Number  1    Number of Visits  6    Date for OT Re-Evaluation  10/20/17    OT Start Time  1300    OT Stop Time  1405    OT Time Calculation (min)  65 min    Activity Tolerance  Patient tolerated treatment well    Behavior During Therapy  [facility] for tasks assessed/performed       Past Medical History:  Diagnosis Date  . Allergy   . Arthritis   . Asthma   . Chicken pox   . GERD (gastroesophageal reflux disease)   . Migraine headache   . Urine incontinence     Past Surgical History:  Procedure Laterality Date  . BREAST CYST ASPIRATION Bilateral   . carpal tunnel sugery     bilateral  . ELBOW SURGERY  1999  . HAND SURGERY  1999   broken finger - pins  . NASAL SINUS SURGERY  1992  . ROTATOR CUFF REPAIR  1994  . Trigger finger surgery  1999 and 2000  . VAGINAL HYSTERECTOMY  1987   secondary to fibroids    There were no vitals filed for this visit.  Subjective Assessment - 09/28/17 0851    Subjective   Patient reports she had an injection on Thursday and wore a brace until Monday.  Reports it feels a little better and can move it more.     Pertinent History  Patient reports issues with left hand over the last year with possible arthritis and trigger finger, confirmed by Dr. Gavin PottersKernodle last week.  Has had 2 injections so far.     Patient Stated Goals  Patient reports she wants to have better motion, decreased swelling.     Currently in Pain?  Yes    Pain Score  5     Pain Location  Hand    Pain Orientation  Left    Pain Descriptors / Indicators  Aching;Shooting    Pain Type  Acute pain    Pain  Onset  More than a month ago    Pain Frequency  Intermittent        [facility] OT Assessment - 09/28/17 0852      Assessment   Medical Diagnosis  left trigger finger/arthritis    Referring Provider  Gavin PottersKernodle, GW    Hand Dominance  Right      Balance Screen   Has the patient fallen in the past 6 months  No    Has the patient had a decrease in activity level because of a fear of falling?   No    Is the patient reluctant to leave their home because of a fear of falling?   No      Home  Environment   Family/patient expects to be discharged to:  Private residence    Living Arrangements  Spouse/significant other    Available Help at Discharge  Family    Type of Home  House    Home Access  Stairs    Home Layout  One level    Alternate Level Stairs - Number of Steps  5 to  enter home    Radiation protection practitioner    Lives With  Spouse      Prior Function   Level of Independence  Independent    Vocation  Retired    Leisure  helps to take care of parents, reading, spending time with family and grandchildren.      ADL   Eating/Feeding  Modified independent    Grooming  Modified independent    Upper Body Bathing  Independent    Lower Body Bathing  Independent    Upper Body Dressing  Needs assist for fasteners    Lower Body Dressing  Modified independent    Toilet Transfer  Independent    Toileting - Clothing Manipulation  Increased time    Toileting -  Hygiene  Independent    Tub/Shower Transfer  Modified independent    ADL comments  assist at times to put on jewelry or bracelets, difficulty with buttons if they are on the back.       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    Medication Management  Is responsible for taking medication in correct dosages at correct time      Written Expression   Dominant Hand  Right      Sensation   Light Touch  Appears Intact    Stereognosis  Appears Intact    Hot/Cold  Appears Intact    Proprioception  Appears Intact      Coordination   Gross Motor Movements are Fluid and Coordinated  Yes    Fine Motor Movements are Fluid and Coordinated  Yes      Edema   Edema  present in left hand index finger base      ROM / Strength   AROM / PROM / Strength  AROM;Strength      AROM   Overall AROM   Within functional limits for tasks performed      Strength   Overall Strength  Deficits    Overall Strength Comments  decreased grip strength in left hand      Right Hand AROM   R Thumb MCP 0-60  50 Degrees    R Thumb IP 0-80  75 Degrees    R Thumb Radial ABduction/ADduction 0-55  45    R Thumb Palmar ABduction/ADduction 0-45  40    R Index  MCP 0-90  90 Degrees    R Index PIP 0-100  90 Degrees    R Index DIP 0-70  70 Degrees    R Long  MCP 0-90  90 Degrees    R Long PIP 0-100  90 Degrees    R Long DIP 0-70  70 Degrees    R Ring  MCP 0-90  90 Degrees    R Ring PIP 0-100  90 Degrees    R Ring DIP 0-70  70 Degrees    R Little  MCP 0-90  90 Degrees    R Little PIP 0-100  90 Degrees    R Little DIP  0-70  70 Degrees      Left Hand AROM   L Thumb MCP 0-60  50 Degrees    L Thumb IP 0-80  75 Degrees    L Thumb Radial ADduction/ABduction 0-55  50    L Thumb Palmar ADduction/ABduction 0-45  45    L Index  MCP 0-90  80 Degrees    L Index PIP 0-100  -15 Degrees -15-80    L Index DIP 0-70  55 Degrees    L Long  MCP 0-90  85 Degrees    L Long PIP 0-100  85 Degrees    L Long DIP 0-70  65 Degrees    L Ring  MCP 0-90  85 Degrees    L Ring PIP 0-100  85 Degrees    L Ring DIP 0-70  65 Degrees    L Little  MCP  0-90  85 Degrees    L Little PIP 0-100  90 Degrees    L Little DIP 0-70  65 Degrees      Hand Function   Right Hand Grip (lbs)  40    Right Hand Lateral Pinch  7 lbs    Right Hand 3 Point Pinch  7 lbs    Left Hand Grip (lbs)  30    Left Hand Lateral Pinch  6 lbs    Left 3 point pinch  6 lbs       Patient reports a pulling sensation when attempting to complete full fisting on the left. She received an injection last week and reports significantly more motion and decreased pain. She is still tender along the A1pulley Has finger splint for left Index finger -15 degrees from extension at PIP Negative tinels Right hand at MCP has edema at times.  Patient was educated and perform contrast for 15 mins to decrease edema and increase motion Tendon gliding exercises for left hand specifically but instructed to perform both hands No tight fisting Oppositional movements of thumb to each digit Instruction of ice massage over A1 pulley on left index Discussed joint protection principles                OT Education - 09/28/17 0906    Education Details  contrast bath, splint wear, ice massage, HEP    Person(s) Educated  Patient    Methods  Explanation;Demonstration    Comprehension  Verbalized understanding;Returned demonstration          OT Long Term Goals - 09/28/17 0914      OT LONG TERM GOAL #1   Title  Pt to be independent in HEP to increase AROM in L digits and decrease triggering     Baseline  decreased ROM at eval    Time  6    Period  Weeks    Status  New    Target Date  10/20/17      OT LONG TERM GOAL #2   Title  Patient to demonstrate understanding of joint protection principles and implement into daily routine.     Baseline  none    Time  3    Period  Weeks    Status  New    Target Date  09/29/17      OT LONG TERM GOAL #3   Title  Patient will manage buttons with modified independence with use of Adaptive equipment as needed.     Baseline   increased difficulty    Time  6    Period  Weeks    Status  New    Target Date  10/20/17      OT LONG TERM GOAL #4   Title  Patient will be independent with contrast bath to help decrease edema and manage symptoms of arthritis/trigger finger    Baseline  no knowledge    Time  2    Period  Weeks    Status  New    Target Date  09/22/17            Plan - 09/28/17 0908    Clinical Impression Statement  Patient is a 62 yo female diagnosed with left index trigger finger and arthritis of B hands.  Patient reports recent decline in ROM, function and increased pain and had injection last week and is feeling much better.  She was referred to OT for hand therapy, evaluation and treatment.   Patient presents with pain in left index, mild edema, tender along A1 pulley of the index finger on the left, decreased ROM and decreased functional use.  She would benefit from skilled OT to [facility] safety and independence in daily tasks by addressing the listed impairments.     Occupational Profile and client history currently impacting functional performance  arthritis, nodules on PIP of index/MF, both fingers are deviated at the DIP joints, edema in R MCP at times.     Occupational performance deficits (Please refer to evaluation for details):  ADL's;IADL's;Leisure    Rehab Potential  Good    OT Frequency  1x / week    OT Duration  6 weeks    OT Treatment/Interventions  Self-care/ADL training;Iontophoresis;Therapeutic exercise;Moist Heat;Paraffin;Splinting;Patient/family education;Fluidtherapy;Energy conservation;Therapeutic activities;Cryotherapy;Ultrasound;Contrast Bath;DME and/or AE instruction;Manual Therapy    Clinical Decision Making  Limited treatment options, no task modification necessary    Consulted and Agree with Plan of Care  Patient       Patient will benefit from skilled therapeutic intervention in order to improve the following deficits and impairments:  Increased edema, Pain, Impaired  flexibility, Decreased range of motion, Decreased strength, Impaired UE functional use  Visit Diagnosis: Pain in joint of left hand  Stiffness of left hand, not elsewhere classified  Muscle weakness (generalized)    Problem List Patient Active Problem List   Diagnosis Date Noted  . Finger pain, left 08/27/2017  . Headache 04/02/2017  . Sinusitis 02/22/2017  . Muscle spasm 12/05/2015  . Asthma 10/17/2015  . Dyspnea 10/17/2015  . Cough 03/06/2015  . CAP (community acquired pneumonia) 02/14/2015  . Family history of colon cancer 11/07/2014  . Abnormal mammogram 11/04/2014  . Health care maintenance 05/07/2014  . Fatigue 02/23/2013  . History of migraine headaches 01/18/2012  . Interstitial cystitis 01/18/2012  . Hypercholesteremia 01/18/2012  . GERD (gastroesophageal reflux disease) 01/18/2012   Amy T Lovett, OTR/L, CLT  Lovett,Amy 09/28/2017, 9:22 AM  [facility] PHYSICAL AND SPORTS MEDICINE [address] [place], [address] Phone: [phone]   Fax:  [phone]  Name: AUDI CONOVER MRN: [id number] Date of Birth: Apr 23, 1955

## 2017-09-28 NOTE — Addendum Note (Signed)
Addended by: Derrek GuLOVETT, Rowan Pollman T on: 09/28/2017 09:27 AM   Modules accepted: Orders

## 2017-10-01 ENCOUNTER — Telehealth: Payer: Self-pay | Admitting: Internal Medicine

## 2017-10-01 NOTE — Telephone Encounter (Signed)
Pt is requesting a refill on her Tolterodine Tartrate ER 4mg , generic for Detrol LA. Dr. Achilles Dunkope prescribed this medication but has moved to [facility]. Pt has a note requesting this medication, note located up front in Dr. Roby LoftsScott's color folder.

## 2017-10-02 MED ORDER — TOLTERODINE TARTRATE ER 4 MG PO CP24: 4.0000 mg | ORAL_CAPSULE | Freq: Every day | ORAL | 2 refills | Status: DC

## 2017-10-02 NOTE — Telephone Encounter (Signed)
[facility]rol LA?

## 2017-10-02 NOTE — Telephone Encounter (Signed)
Ok.  Any problems, let us know.

## 2017-10-02 NOTE — Telephone Encounter (Signed)
Patient notified and voiced understanding.

## 2017-10-19 ENCOUNTER — Ambulatory Visit (INDEPENDENT_AMBULATORY_CARE_PROVIDER_SITE_OTHER): Payer: BLUE CROSS/BLUE SHIELD | Admitting: Internal Medicine

## 2017-10-19 VITALS — BP 128/76 | HR 76 | Temp 97.6°F | Resp 16 | Wt 187.8 lb

## 2017-10-19 DIAGNOSIS — E7800 Pure hypercholesterolemia, unspecified: Secondary | ICD-10-CM

## 2017-10-19 DIAGNOSIS — N3010 Interstitial cystitis (chronic) without hematuria: Secondary | ICD-10-CM

## 2017-10-19 DIAGNOSIS — R739 Hyperglycemia, unspecified: Secondary | ICD-10-CM

## 2017-10-19 DIAGNOSIS — K219 Gastro-esophageal reflux disease without esophagitis: Secondary | ICD-10-CM

## 2017-10-19 DIAGNOSIS — J4520 Mild intermittent asthma, uncomplicated: Secondary | ICD-10-CM | POA: Diagnosis not present

## 2017-10-19 MED ORDER — PANTOPRAZOLE SODIUM 40 MG PO TBEC: 40.0000 mg | DELAYED_RELEASE_TABLET | Freq: Two times a day (BID) | ORAL | 3 refills | Status: DC

## 2017-10-19 NOTE — Progress Notes (Signed)
Patient ID: Savannah Gomez, female   DOB: 03/21/1956, 62 y.o.   MRN: [id number]   Subjective:    Patient ID: Savannah NeighborsCynthia H Gomez, female    DOB: 03/21/1956, 62 y.o.   MRN: [id number]  HPI  Patient here for a scheduled follow up.  She reports she has been having problems with increased acid reflux and regurgitation.  She is taking protonix daily.  Wants to increase to bid.  Is taking mylanta.  Helping with heartburn.  Still persistent symptoms.  No chest pain.  No sob.  Breathing stable.  No abdominal pain.  Bowels moving.  S/p injection for trigger finger.  Went to therapy.  Doing exercises at home.     Past Medical History:  Diagnosis Date  . Allergy   . Arthritis   . Asthma   . Chicken pox   . GERD (gastroesophageal reflux disease)   . Migraine headache   . Urine incontinence    Past Surgical History:  Procedure Laterality Date  . BREAST CYST ASPIRATION Bilateral   . carpal tunnel sugery     bilateral  . ELBOW SURGERY  1999  . HAND SURGERY  1999   broken finger - pins  . NASAL SINUS SURGERY  1992  . ROTATOR CUFF REPAIR  1994  . Trigger finger surgery  1999 and 2000  . VAGINAL HYSTERECTOMY  1987   secondary to fibroids   Family History  Problem Relation Age of Onset  . Colon cancer Mother        colon  . Asthma Mother   . Heart disease Father   . Hypertension Father   . Colon cancer Sister   . Stroke Maternal Grandfather   . Diabetes Maternal Grandfather   . Lung cancer Maternal Grandmother   . Breast cancer Maternal Grandmother    Social History   Socioeconomic History  . Marital status: Married    Spouse name: Not on file  . Number of children: Not on file  . Years of education: Not on file  . Highest education level: Not on file  Occupational History  . Not on file  Social Needs  . Financial resource strain: Not on file  . Food insecurity:    Worry: Not on file    Inability: Not on file  . Transportation needs:    Medical: Not on file    Non-medical:  Not on file  Tobacco Use  . Smoking status: Never Smoker  . Smokeless tobacco: Never Used  Substance and Sexual Activity  . Alcohol use: No    Alcohol/week: 0.0 oz  . Drug use: No  . Sexual activity: Not on file  Lifestyle  . Physical activity:    Days per week: Not on file    Minutes per session: Not on file  . Stress: Not on file  Relationships  . Social connections:    Talks on phone: Not on file    Gets together: Not on file    Attends religious service: Not on file    Active member of club or organization: Not on file    Attends meetings of clubs or organizations: Not on file    Relationship status: Not on file  Other Topics Concern  . Not on file  Social History Narrative  . Not on file    Outpatient Encounter Medications as of 10/19/2017  Medication Sig  . albuterol (PROVENTIL HFA;VENTOLIN HFA) 108 (90 Base) MCG/ACT inhaler Inhale 2 puffs into the lungs every 6 (six)  hours as needed for wheezing or shortness of breath.  . calcium carbonate (CALCIUM 600) 600 MG TABS tablet Take 600 mg by mouth daily with breakfast.  . cetirizine (ZYRTEC) 10 MG tablet Take 10 mg by mouth daily.  Marland Kitchen dexlansoprazole (DEXILANT) 60 MG capsule Take 1 capsule (60 mg total) by mouth daily. (Patient not taking: Reported on 09/08/2017)  . diltiazem (CARDIZEM CD) 120 MG 24 hr capsule   . Docusate Calcium (STOOL SOFTENER PO) Take by mouth daily.  . fluticasone (FLONASE) 50 MCG/ACT nasal spray Place 1 spray into the nose daily as needed.  . fluticasone (FLOVENT HFA) 110 MCG/ACT inhaler Inhale 2 puffs into the lungs 2 (two) times daily.  Marland Kitchen imipramine (TOFRANIL) 25 MG tablet Take 25 mg by mouth 2 (two) times daily.  . metoprolol succinate (TOPROL-XL) 25 MG 24 hr tablet Take 0.5 tablets (12.5 mg total) by mouth daily. (Patient taking differently: Take 25 mg by mouth daily. )  . mirabegron ER (MYRBETRIQ) 25 MG TB24 Take 25 mg by mouth 2 (two) times daily.  . ondansetron (ZOFRAN ODT) 4 MG disintegrating  tablet Take 1 tablet (4 mg total) by mouth every 8 (eight) hours as needed for nausea or vomiting.  . pantoprazole (PROTONIX) 40 MG tablet Take 1 tablet (40 mg total) by mouth 2 (two) times daily before a meal.  . tolterodine (DETROL LA) 4 MG 24 hr capsule Take 1 capsule (4 mg total) by mouth daily.  Marland Kitchen topiramate (TOPAMAX) 25 MG tablet Take 1 tablet (25 mg total) by mouth 2 (two) times daily.  . [DISCONTINUED] Fluticasone Furoate (ARNUITY ELLIPTA) 100 MCG/ACT AEPB Inhale 1 puff into the lungs daily.  . [DISCONTINUED] pantoprazole (PROTONIX) 40 MG tablet Take 1 tablet (40 mg total) by mouth daily.   No facility-administered encounter medications on file as of 10/19/2017.     Review of Systems  Constitutional: Negative for appetite change and unexpected weight change.  HENT: Negative for congestion and sinus pressure.   Respiratory: Negative for cough, chest tightness and shortness of breath.   Cardiovascular: Negative for chest pain, palpitations and leg swelling.  Gastrointestinal: Negative for abdominal pain, diarrhea, nausea and vomiting.       Acid reflux and regurgitation.    Genitourinary: Negative for difficulty urinating and dysuria.  Musculoskeletal: Negative for joint swelling and myalgias.  Skin: Negative for color change and rash.  Neurological: Negative for dizziness, light-headedness and headaches.  Psychiatric/Behavioral: Negative for agitation and dysphoric mood.       Objective:    Physical Exam  Constitutional: She appears well-developed and well-nourished. No distress.  HENT:  Nose: Nose normal.  Mouth/Throat: Oropharynx is clear and moist.  Neck: Neck supple. No thyromegaly present.  Cardiovascular: Normal rate and regular rhythm.  Pulmonary/Chest: Breath sounds normal. No respiratory distress. She has no wheezes.  Abdominal: Soft. Bowel sounds are normal. There is no tenderness.  Musculoskeletal: She exhibits no edema or tenderness.  Lymphadenopathy:    She  has no cervical adenopathy.  Skin: No rash noted. No erythema.  Psychiatric: She has a normal mood and affect. Her behavior is normal.    BP 128/76 (BP Location: Left Arm, Patient Position: Sitting, Cuff Size: Large)   Pulse 76   Temp 97.6 F (36.4 C) (Oral)   Resp 16   Wt 187 lb 12.8 oz (85.2 kg)   SpO2 98%   BMI 33.27 kg/m  Wt Readings from Last 3 Encounters:  10/19/17 187 lb 12.8 oz (85.2 kg)  08/25/17  190 lb 9.6 oz (86.5 kg)  07/28/17 192 lb (87.1 kg)     Lab Results  Component Value Date   WBC 5.3 04/02/2017   HGB 13.9 04/02/2017   HCT 41.7 04/02/2017   PLT 241.0 04/02/2017   GLUCOSE 103 (H) 09/14/2017   CHOL 175 09/14/2017   TRIG 45.0 09/14/2017   HDL 78.60 09/14/2017   LDLCALC 87 09/14/2017   ALT 18 09/14/2017   AST 15 09/14/2017   NA 140 09/14/2017   K 4.2 09/14/2017   CL 108 09/14/2017   CREATININE 1.02 09/14/2017   BUN 14 09/14/2017   CO2 25 09/14/2017   TSH 2.29 09/14/2017   HGBA1C 5.7 09/14/2017    Mm 3d Screen Breast Bilateral  Result Date: 09/28/2017 CLINICAL DATA:  Screening. EXAM: DIGITAL SCREENING BILATERAL MAMMOGRAM WITH TOMO AND CAD COMPARISON:  Previous exam(s). ACR Breast Density Category b: There are scattered areas of fibroglandular density. FINDINGS: There are no findings suspicious for malignancy. Images were processed with CAD. IMPRESSION: No mammographic evidence of malignancy. A result letter of this screening mammogram will be mailed directly to the patient. RECOMMENDATION: Screening mammogram in one year. (Code:SM-B-01Y) BI-RADS CATEGORY  1: Negative. Electronically Signed   By: Baird Lyons M.D.   On: 09/28/2017 14:43       Assessment & Plan:   Problem List Items Addressed This Visit    Asthma    Is followed by pulmonary.  On flovent.  Breathing stable.  Treat acid reflux.        GERD (gastroesophageal reflux disease)    On protonix daily.  Having persistent acid reflux and regurgitation.  is some better, but persistent symptoms.   Will increase to bid.  Follow closely.  Schedule f/u to reassess.  Discussed f/u with GI if persistent.        Relevant Medications   pantoprazole (PROTONIX) 40 MG tablet   Hypercholesteremia    Low cholesterol diet and exercise.  Follow lipid panel.        Relevant Orders   Hepatic function panel   Lipid panel   Interstitial cystitis    Has been followed by Dr Achilles Dunk.  Establishing with new urologist since he moved.         Other Visit Diagnoses    Hyperglycemia    -  Primary   Relevant Orders   Hemoglobin A1c   Basic metabolic panel       Dale Cathlamet, MD

## 2017-10-19 NOTE — Patient Instructions (Signed)
shingrix - new shingles vaccine

## 2017-10-21 ENCOUNTER — Encounter: Payer: Self-pay | Admitting: Internal Medicine

## 2017-10-21 NOTE — Assessment & Plan Note (Signed)
Has been followed by Dr Achilles Dunkope.  Establishing with new urologist since he moved.

## 2017-10-21 NOTE — Assessment & Plan Note (Signed)
Low cholesterol diet and exercise.  Follow lipid panel.   

## 2017-10-21 NOTE — Assessment & Plan Note (Signed)
On protonix daily.  Having persistent acid reflux and regurgitation.  is some better, but persistent symptoms.  Will increase to bid.  Follow closely.  Schedule f/u to reassess.  Discussed f/u with GI if persistent.

## 2017-10-21 NOTE — Assessment & Plan Note (Signed)
Is followed by pulmonary.  On flovent.  Breathing stable.  Treat acid reflux.

## 2017-11-02 ENCOUNTER — Telehealth: Payer: Self-pay | Admitting: Internal Medicine

## 2017-11-02 ENCOUNTER — Other Ambulatory Visit: Payer: Self-pay

## 2017-11-02 MED ORDER — PANTOPRAZOLE SODIUM 40 MG PO TBEC: 40.0000 mg | DELAYED_RELEASE_TABLET | Freq: Two times a day (BID) | ORAL | 3 refills | Status: DC

## 2017-11-02 NOTE — Telephone Encounter (Signed)
Copied from CRM [phone]#[id number]. Topic: General - Other >> Nov 02, 2017 11:19 AM Tamela OddiHarris, Alvaro Aungst J wrote: Reason for CRM: Joy with [facility] called to verify frequency on prescription for pantoprazole (PROTONIX) 40 MG tablet,  Please confirm if the dosage is 1 or 2x daily.  Please advise.  CB# [phone].

## 2017-11-02 NOTE — Telephone Encounter (Signed)
Savannah Gomez is [facility] and states that she needs a new prescription for Protonix with the correct dosage. Savannah Gomez is requesting a call back from [place].  CB# [phone]

## 2017-11-02 NOTE — Telephone Encounter (Signed)
Pharmacy called back with clarification of rx request

## 2017-11-02 NOTE — Telephone Encounter (Signed)
New rx sent in Attempted to call Harriett SineNancy back

## 2017-11-02 NOTE — Telephone Encounter (Signed)
[facility] to clarify that protonix should be BID per rx on 10/19/17

## 2017-11-16 ENCOUNTER — Ambulatory Visit (INDEPENDENT_AMBULATORY_CARE_PROVIDER_SITE_OTHER): Payer: BLUE CROSS/BLUE SHIELD | Admitting: Urology

## 2017-11-16 ENCOUNTER — Encounter: Payer: Self-pay | Admitting: Urology

## 2017-11-16 VITALS — BP 126/76 | HR 88 | Ht 63.0 in | Wt 180.0 lb

## 2017-11-16 DIAGNOSIS — N200 Calculus of kidney: Secondary | ICD-10-CM

## 2017-11-16 DIAGNOSIS — I499 Cardiac arrhythmia, unspecified: Secondary | ICD-10-CM | POA: Insufficient documentation

## 2017-11-16 DIAGNOSIS — N3010 Interstitial cystitis (chronic) without hematuria: Secondary | ICD-10-CM

## 2017-11-16 DIAGNOSIS — K5792 Diverticulitis of intestine, part unspecified, without perforation or abscess without bleeding: Secondary | ICD-10-CM | POA: Insufficient documentation

## 2017-11-16 DIAGNOSIS — K635 Polyp of colon: Secondary | ICD-10-CM | POA: Insufficient documentation

## 2017-11-16 MED ORDER — TOLTERODINE TARTRATE ER 4 MG PO CP24: 4.0000 mg | ORAL_CAPSULE | Freq: Every day | ORAL | 3 refills | Status: DC

## 2017-11-16 MED ORDER — IMIPRAMINE HCL 25 MG PO TABS: 25.0000 mg | ORAL_TABLET | Freq: Two times a day (BID) | ORAL | 3 refills | Status: DC

## 2017-11-16 NOTE — Progress Notes (Signed)
11/16/2017 12:38 PM   Savannah Gomez 08-Jan-1956 [id number]  Referring provider: Dale DurhamScott, Charlene, MD [address] [place], [place] [id number]  Chief Complaint  Patient presents with  . New Patient (Initial Visit)    Establish Care   Urologic history: 1.  Interstitial cystitis - Imipramine 50 mg daily - Tolterodine 4 mg daily  2.  Left nephrolithiasis - 4 mm nonobstructing left lower pole calculus  HPI: 62 year old female presents to establish local urologic care.  She has previously followed Dr. Achilles Dunkope for interstitial cystitis for several years while he was in [place] and subsequently [facility]  [facility].  Her symptoms have been well managed on imipramine and tolterodine.  She presently has no complaints and presents for refill of her medication.  She denies dysuria or gross hematuria.  She has no flank, abdominal or pelvic pain.  She had a CT scan performed in 2015 which incidentally showed a 4 mm left lower pole nonobstructing renal calculus.  This has been followed with serial KUBs and her last KUB in July 2018 was read as a 2 mm left lower pole calculus.  PMH: Past Medical History:  Diagnosis Date  . Allergy   . Arthritis   . Asthma   . Chicken pox   . GERD (gastroesophageal reflux disease)   . Migraine headache   . Urine incontinence     Surgical History: Past Surgical History:  Procedure Laterality Date  . BREAST CYST ASPIRATION Bilateral   . carpal tunnel sugery     bilateral  . ELBOW SURGERY  1999  . HAND SURGERY  1999   broken finger - pins  . NASAL SINUS SURGERY  1992  . ROTATOR CUFF REPAIR  1994  . Trigger finger surgery  1999 and 2000  . VAGINAL HYSTERECTOMY  1987   secondary to fibroids    Home Medications:  Allergies as of 11/16/2017      Reactions   Demerol [meperidine] Nausea Only      Medication List        Accurate as of 11/16/17 12:38 PM. Always use your most recent med  list.          albuterol 108 (90 Base) MCG/ACT inhaler Commonly known as:  PROVENTIL HFA;VENTOLIN HFA Inhale 2 puffs into the lungs every 6 (six) hours as needed for wheezing or shortness of breath.   CALCIUM 600 600 MG Tabs tablet Generic drug:  calcium carbonate Take 600 mg by mouth daily with breakfast.   cetirizine 10 MG tablet Commonly known as:  ZYRTEC Take 10 mg by mouth daily.   dexlansoprazole 60 MG capsule Commonly known as:  DEXILANT Take 1 capsule (60 mg total) by mouth daily.   diltiazem 120 MG 24 hr capsule Commonly known as:  CARDIZEM CD   fluticasone 110 MCG/ACT inhaler Commonly known as:  FLOVENT HFA Inhale 2 puffs into the lungs 2 (two) times daily.   fluticasone 50 MCG/ACT nasal spray Commonly known as:  FLONASE Place 1 spray into the nose daily as needed.   imipramine 25 MG tablet Commonly known as:  TOFRANIL Take 25 mg by mouth 2 (two) times daily.   metoprolol succinate 25 MG 24 hr tablet Commonly known as:  TOPROL-XL Take 0.5 tablets (12.5 mg total) by mouth daily.   pantoprazole 40 MG tablet Commonly known as:  PROTONIX Take 1 tablet (40 mg total) by mouth 2 (two) times daily before a  meal.   STOOL SOFTENER PO Take by mouth daily.   tolterodine 4 MG 24 hr capsule Commonly known as:  DETROL LA Take 1 capsule (4 mg total) by mouth daily.   topiramate 25 MG tablet Commonly known as:  TOPAMAX Take 1 tablet (25 mg total) by mouth 2 (two) times daily.       Allergies:  Allergies  Allergen Reactions  . Demerol [Meperidine] Nausea Only    Family History: Family History  Problem Relation Age of Onset  . Colon cancer Mother        colon  . Asthma Mother   . Heart disease Father   . Hypertension Father   . Colon cancer Sister   . Stroke Maternal Grandfather   . Diabetes Maternal Grandfather   . Lung cancer Maternal Grandmother   . Breast cancer Maternal Grandmother     Social History:  reports that she has never smoked. She  has never used smokeless tobacco. She reports that she does not drink alcohol or use drugs.  ROS: UROLOGY Frequent Urination?: Yes Hard to postpone urination?: Yes Burning/pain with urination?: No Get up at night to urinate?: Yes Leakage of urine?: Yes Urine stream starts and stops?: No Trouble starting stream?: No Do you have to strain to urinate?: No Blood in urine?: No Urinary tract infection?: No Sexually transmitted disease?: No Injury to kidneys or bladder?: No Painful intercourse?: No Weak stream?: No Currently pregnant?: No Vaginal bleeding?: No Last menstrual period?: n  Gastrointestinal Nausea?: No Vomiting?: No Indigestion/heartburn?: Yes Diarrhea?: No Constipation?: No  Constitutional Fever: No Night sweats?: No Weight loss?: No Fatigue?: No  Skin Skin rash/lesions?: No Itching?: No  Eyes Blurred vision?: No Double vision?: No  Ears/Nose/Throat Sore throat?: No Sinus problems?: No  Hematologic/Lymphatic Swollen glands?: No Easy bruising?: Yes  Cardiovascular Leg swelling?: No Chest pain?: No  Respiratory Cough?: Yes Shortness of breath?: No  Endocrine Excessive thirst?: Yes  Musculoskeletal Back pain?: No Joint pain?: No  Neurological Headaches?: Yes Dizziness?: No  Psychologic Depression?: No Anxiety?: No  Physical Exam: BP 126/76   Pulse 88   Ht 5\' 3"  (1.6 m)   Wt 180 lb (81.6 kg)   BMI 31.89 kg/m   Constitutional:  Alert and oriented, No acute distress. HEENT: [place] AT, moist mucus membranes.  Trachea midline, no masses. Cardiovascular: No clubbing, cyanosis, or edema. Respiratory: Normal respiratory effort, no increased work of breathing. GI: Abdomen is soft, nontender, nondistended, no abdominal masses GU: No CVA tenderness Lymph: No cervical or inguinal lymphadenopathy. Skin: No rashes, bruises or suspicious lesions. Neurologic: Grossly intact, no focal deficits, moving all 4 extremities. Psychiatric: Normal mood  and affect.   Assessment & Plan:    1.  Interstitial cystitis Stable symptoms on imipramine and tolterodine which were refilled.  Continue annual follow-up  2.  Nephrolithiasis KUB was ordered and she will be notified with the results.   Riki Altes, MD  [facility] [address] [place], [place] [address] [phone]

## 2017-11-26 ENCOUNTER — Other Ambulatory Visit: Payer: Self-pay | Admitting: Internal Medicine

## 2018-01-29 ENCOUNTER — Other Ambulatory Visit (INDEPENDENT_AMBULATORY_CARE_PROVIDER_SITE_OTHER): Payer: BLUE CROSS/BLUE SHIELD

## 2018-01-29 DIAGNOSIS — E7800 Pure hypercholesterolemia, unspecified: Secondary | ICD-10-CM

## 2018-01-29 DIAGNOSIS — R739 Hyperglycemia, unspecified: Secondary | ICD-10-CM

## 2018-01-29 LAB — BASIC METABOLIC PANEL
BUN: 12 mg/dL (ref 6–23)
CHLORIDE: 107 meq/L (ref 96–112)
CO2: 25 meq/L (ref 19–32)
CREATININE: 0.98 mg/dL (ref 0.40–1.20)
Calcium: 9.4 mg/dL (ref 8.4–10.5)
GFR: 60.82 mL/min (ref >60.00)
Glucose, Bld: 100 mg/dL — ABNORMAL HIGH (ref 70–99)
Potassium: 4 mEq/L (ref 3.5–5.1)
SODIUM: 140 meq/L (ref 135–145)

## 2018-01-29 LAB — HEMOGLOBIN A1C: Hgb A1c MFr Bld: 5.5 % (ref 4.6–6.5)

## 2018-01-29 LAB — HEPATIC FUNCTION PANEL
ALBUMIN: 4.4 g/dL (ref 3.5–5.2)
ALK PHOS: 147 U/L — AB (ref 39–117)
ALT: 23 U/L (ref 0–35)
AST: 19 U/L (ref 0–37)
BILIRUBIN DIRECT: 0.1 mg/dL (ref 0.0–0.3)
TOTAL PROTEIN: 6.9 g/dL (ref 6.0–8.3)
Total Bilirubin: 0.5 mg/dL (ref 0.2–1.2)

## 2018-01-29 LAB — LIPID PANEL
CHOL/HDL RATIO: 2
CHOLESTEROL: 191 mg/dL (ref 0–200)
HDL: 81.2 mg/dL (ref >39.00)
LDL CALC: 95 mg/dL (ref 0–99)
NonHDL: 109.7
TRIGLYCERIDES: 74 mg/dL (ref 0.0–149.0)
VLDL: 14.8 mg/dL (ref 0.0–40.0)

## 2018-02-01 ENCOUNTER — Encounter: Payer: Self-pay | Admitting: Internal Medicine

## 2018-02-01 ENCOUNTER — Other Ambulatory Visit: Payer: BLUE CROSS/BLUE SHIELD

## 2018-02-02 ENCOUNTER — Ambulatory Visit (INDEPENDENT_AMBULATORY_CARE_PROVIDER_SITE_OTHER): Payer: BLUE CROSS/BLUE SHIELD | Admitting: Internal Medicine

## 2018-02-02 DIAGNOSIS — E7800 Pure hypercholesterolemia, unspecified: Secondary | ICD-10-CM

## 2018-02-02 DIAGNOSIS — R748 Abnormal levels of other serum enzymes: Secondary | ICD-10-CM

## 2018-02-02 DIAGNOSIS — K219 Gastro-esophageal reflux disease without esophagitis: Secondary | ICD-10-CM | POA: Diagnosis not present

## 2018-02-02 DIAGNOSIS — J4520 Mild intermittent asthma, uncomplicated: Secondary | ICD-10-CM

## 2018-02-02 DIAGNOSIS — N3010 Interstitial cystitis (chronic) without hematuria: Secondary | ICD-10-CM

## 2018-02-02 MED ORDER — TOPIRAMATE 25 MG PO TABS: 25.0000 mg | ORAL_TABLET | Freq: Two times a day (BID) | ORAL | 1 refills | Status: DC

## 2018-02-02 MED ORDER — PANTOPRAZOLE SODIUM 40 MG PO TBEC: 40.0000 mg | DELAYED_RELEASE_TABLET | Freq: Two times a day (BID) | ORAL | 1 refills | Status: DC

## 2018-02-02 NOTE — Progress Notes (Signed)
Patient ID: Savannah Gomez, female   DOB: [birth year], 62 y.o.   MRN: [id number]   Subjective:    Patient ID: Savannah Gomez, female    DOB: August 31, 1955, 62 y.o.   MRN: [id number]  HPI  Patient here for a scheduled follow up. States she is doing relatively well.  Tries to stay active.  No chest pain.  No sob.  No acid reflux.  Swallowing ok.  No abdominal pain.  Bowels moving.  No urine change.  Has lost weight.  Had a fall. Caught herself.  Injured right wrist.  Diagnosed - radial styloid tenosynovitis.  S/p injection.  Brace.  Handling stress.  Overall she feels she is doing well.     Past Medical History:  Diagnosis Date  . Allergy   . Arthritis   . Asthma   . Chicken pox   . GERD (gastroesophageal reflux disease)   . Migraine headache   . Urine incontinence    Past Surgical History:  Procedure Laterality Date  . BREAST CYST ASPIRATION Bilateral   . carpal tunnel sugery     bilateral  . ELBOW SURGERY  1999  . HAND SURGERY  1999   broken finger - pins  . NASAL SINUS SURGERY  1992  . ROTATOR CUFF REPAIR  1994  . Trigger finger surgery  1999 and 2000  . VAGINAL HYSTERECTOMY  1987   secondary to fibroids   Family History  Problem Relation Age of Onset  . Colon cancer Mother        colon  . Asthma Mother   . Heart disease Father   . Hypertension Father   . Colon cancer Sister   . Stroke Maternal Grandfather   . Diabetes Maternal Grandfather   . Lung cancer Maternal Grandmother   . Breast cancer Maternal Grandmother    Social History   Socioeconomic History  . Marital status: Married    Spouse name: Not on file  . Number of children: Not on file  . Years of education: Not on file  . Highest education level: Not on file  Occupational History  . Not on file  Social Needs  . Financial resource strain: Not on file  . Food insecurity:    Worry: Not on file    Inability: Not on file  . Transportation needs:    Medical: Not on file    Non-medical: Not on file    Tobacco Use  . Smoking status: Never Smoker  . Smokeless tobacco: Never Used  Substance and Sexual Activity  . Alcohol use: No    Alcohol/week: 0.0 standard drinks  . Drug use: No  . Sexual activity: Not on file  Lifestyle  . Physical activity:    Days per week: Not on file    Minutes per session: Not on file  . Stress: Not on file  Relationships  . Social connections:    Talks on phone: Not on file    Gets together: Not on file    Attends religious service: Not on file    Active member of club or organization: Not on file    Attends meetings of clubs or organizations: Not on file    Relationship status: Not on file  Other Topics Concern  . Not on file  Social History Narrative  . Not on file    Outpatient Encounter Medications as of 02/02/2018  Medication Sig  . albuterol (PROVENTIL HFA;VENTOLIN HFA) 108 (90 Base) MCG/ACT inhaler Inhale 2 puffs into the  lungs every 6 (six) hours as needed for wheezing or shortness of breath.  . calcium carbonate (CALCIUM 600) 600 MG TABS tablet Take 600 mg by mouth daily with breakfast.  . cetirizine (ZYRTEC) 10 MG tablet Take 10 mg by mouth daily.  Tery Sanfilippo. Docusate Calcium (STOOL SOFTENER PO) Take by mouth daily.  . fluticasone (FLONASE) 50 MCG/ACT nasal spray Place 1 spray into the nose daily as needed.  . fluticasone (FLOVENT HFA) 110 MCG/ACT inhaler Inhale 2 puffs into the lungs 2 (two) times daily.  Marland Kitchen. imipramine (TOFRANIL) 25 MG tablet Take 1 tablet (25 mg total) by mouth 2 (two) times daily.  . pantoprazole (PROTONIX) 40 MG tablet Take 1 tablet (40 mg total) by mouth 2 (two) times daily before a meal.  . tolterodine (DETROL LA) 4 MG 24 hr capsule Take 1 capsule (4 mg total) by mouth daily.  Marland Kitchen. topiramate (TOPAMAX) 25 MG tablet Take 1 tablet (25 mg total) by mouth 2 (two) times daily.  . [DISCONTINUED] dexlansoprazole (DEXILANT) 60 MG capsule Take 1 capsule (60 mg total) by mouth daily.  . [DISCONTINUED] diltiazem (CARDIZEM CD) 120 MG 24 hr  capsule   . [DISCONTINUED] metoprolol succinate (TOPROL-XL) 25 MG 24 hr tablet Take 0.5 tablets (12.5 mg total) by mouth daily. (Patient taking differently: Take 25 mg by mouth daily. )  . [DISCONTINUED] pantoprazole (PROTONIX) 40 MG tablet Take 1 tablet (40 mg total) by mouth 2 (two) times daily before a meal.  . [DISCONTINUED] topiramate (TOPAMAX) 25 MG tablet TAKE 1 TABLET BY MOUTH TWICE A DAY   No facility-administered encounter medications on file as of 02/02/2018.     Review of Systems  Constitutional: Negative for appetite change and unexpected weight change.  HENT: Negative for congestion and sinus pressure.   Respiratory: Negative for cough, chest tightness and shortness of breath.   Cardiovascular: Negative for chest pain, palpitations and leg swelling.  Gastrointestinal: Negative for abdominal pain, diarrhea, nausea and vomiting.  Genitourinary: Negative for difficulty urinating and dysuria.  Musculoskeletal: Negative for joint swelling and myalgias.  Skin: Negative for color change and rash.  Neurological: Negative for dizziness, light-headedness and headaches.  Psychiatric/Behavioral: Negative for agitation and dysphoric mood.       Objective:    Physical Exam  Constitutional: She appears well-developed and well-nourished. No distress.  HENT:  Nose: Nose normal.  Mouth/Throat: Oropharynx is clear and moist.  Neck: Neck supple. No thyromegaly present.  Cardiovascular: Normal rate and regular rhythm.  Pulmonary/Chest: Breath sounds normal. No respiratory distress. She has no wheezes.  Abdominal: Soft. Bowel sounds are normal. There is no tenderness.  Musculoskeletal: She exhibits no edema or tenderness.  Lymphadenopathy:    She has no cervical adenopathy.  Skin: No rash noted. No erythema.  Psychiatric: She has a normal mood and affect. Her behavior is normal.    BP 128/74 (BP Location: Left Arm, Patient Position: Sitting, Cuff Size: Large)   Pulse 90   Temp 97.8  F (36.6 C) (Oral)   Resp 18   Wt 179 lb (81.2 kg)   SpO2 98%   BMI 31.71 kg/m  Wt Readings from Last 3 Encounters:  02/02/18 179 lb (81.2 kg)  11/16/17 180 lb (81.6 kg)  10/19/17 187 lb 12.8 oz (85.2 kg)     Lab Results  Component Value Date   WBC 5.3 04/02/2017   HGB 13.9 04/02/2017   HCT 41.7 04/02/2017   PLT 241.0 04/02/2017   GLUCOSE 100 (H) 01/29/2018   CHOL  191 01/29/2018   TRIG 74.0 01/29/2018   HDL 81.20 01/29/2018   LDLCALC 95 01/29/2018   ALT 23 01/29/2018   AST 19 01/29/2018   NA 140 01/29/2018   K 4.0 01/29/2018   CL 107 01/29/2018   CREATININE 0.98 01/29/2018   BUN 12 01/29/2018   CO2 25 01/29/2018   TSH 2.29 09/14/2017   HGBA1C 5.5 01/29/2018    Mm 3d Screen Breast Bilateral  Result Date: 09/28/2017 CLINICAL DATA:  Screening. EXAM: DIGITAL SCREENING BILATERAL MAMMOGRAM WITH TOMO AND CAD COMPARISON:  Previous exam(s). ACR Breast Density Category b: There are scattered areas of fibroglandular density. FINDINGS: There are no findings suspicious for malignancy. Images were processed with CAD. IMPRESSION: No mammographic evidence of malignancy. A result letter of this screening mammogram will be mailed directly to the patient. RECOMMENDATION: Screening mammogram in one year. (Code:SM-B-01Y) BI-RADS CATEGORY  1: Negative. Electronically Signed   By: Baird Lyons M.D.   On: 09/28/2017 14:43       Assessment & Plan:   Problem List Items Addressed This Visit    Asthma    Has been followed by pulmonary.  Breathing stable. On flovent.        Elevated alkaline phosphatase level    Had recent wrist injury.  Recheck liver panel.        Relevant Orders   Hepatic function panel   GERD (gastroesophageal reflux disease)    Symptoms controlled on bid protonix.  Follow.  Sees GI.        Relevant Medications   pantoprazole (PROTONIX) 40 MG tablet   Hypercholesteremia    Low cholesterol diet and exercise.  Follow lipid panel and liver function tests.         Interstitial cystitis    Has been followed by Dr Achilles Dunk.            Dale Vera, MD

## 2018-02-07 ENCOUNTER — Encounter: Payer: Self-pay | Admitting: Internal Medicine

## 2018-02-07 DIAGNOSIS — R748 Abnormal levels of other serum enzymes: Secondary | ICD-10-CM | POA: Insufficient documentation

## 2018-02-07 NOTE — Assessment & Plan Note (Signed)
Had recent wrist injury.  Recheck liver panel.

## 2018-02-07 NOTE — Assessment & Plan Note (Signed)
Has been followed by pulmonary.  Breathing stable. On flovent.

## 2018-02-07 NOTE — Assessment & Plan Note (Signed)
Low cholesterol diet and exercise.  Follow lipid panel and liver function tests.  

## 2018-02-07 NOTE — Assessment & Plan Note (Signed)
Symptoms controlled on bid protonix.  Follow.  Sees GI.

## 2018-02-07 NOTE — Assessment & Plan Note (Signed)
Has been followed by Dr Cope.   

## 2018-02-25 ENCOUNTER — Encounter: Payer: Self-pay | Admitting: Internal Medicine

## 2018-02-25 ENCOUNTER — Ambulatory Visit (INDEPENDENT_AMBULATORY_CARE_PROVIDER_SITE_OTHER): Payer: BLUE CROSS/BLUE SHIELD | Admitting: Internal Medicine

## 2018-02-25 VITALS — BP 138/80 | HR 98 | Ht 63.0 in | Wt 176.8 lb

## 2018-02-25 DIAGNOSIS — R490 Dysphonia: Secondary | ICD-10-CM | POA: Diagnosis not present

## 2018-02-25 DIAGNOSIS — J4520 Mild intermittent asthma, uncomplicated: Secondary | ICD-10-CM

## 2018-02-25 DIAGNOSIS — J309 Allergic rhinitis, unspecified: Secondary | ICD-10-CM

## 2018-02-25 NOTE — Progress Notes (Signed)
[facility] Medicine Consultation      MRN# [id number] Wende NeighborsCynthia H Fleeger 1956/01/31   CC  Follow up asthma   Brief History: 62 yo with Hx of Asthma (positive MCH), stopped meds, and presented back to [facility] with chronic cough, now on Arnuity with significant improvement.  Dx with ASTHMA 15 years ago Pneumonia 2 years ago Triggers-pollen, cats,dogs  HPI Patient asthma is well controlled at this time No signs of exacerbation at this time  no shortness of breath no cough no wheezing  Patient however does have intermittent cough and intermittent shortness of Breath she still has a hoarse voice  This has improved with changing to Flovent HFA She currently has GERD which is under control with Protonix She currently takes Zyrtec and Flonase for allergic rhinitis  [facility] has intermittent hoarseness of voice She will need ENT referral for further evaluation    Current Outpatient Medications:  .  albuterol (PROVENTIL HFA;VENTOLIN HFA) 108 (90 Base) MCG/ACT inhaler, Inhale 2 puffs into the lungs every 6 (six) hours as needed for wheezing or shortness of breath., Disp: 1 Inhaler, Rfl: 5 .  calcium carbonate (CALCIUM 600) 600 MG TABS tablet, Take 600 mg by mouth daily with breakfast., Disp: , Rfl:  .  cetirizine (ZYRTEC) 10 MG tablet, Take 10 mg by mouth daily., Disp: , Rfl:  .  Docusate Calcium (STOOL SOFTENER PO), Take by mouth daily., Disp: , Rfl:  .  fluticasone (FLONASE) 50 MCG/ACT nasal spray, Place 1 spray into the nose daily as needed., Disp: , Rfl:  .  fluticasone (FLOVENT HFA) 110 MCG/ACT inhaler, Inhale 2 puffs into the lungs 2 (two) times daily., Disp: 1 Inhaler, Rfl: 2 .  imipramine (TOFRANIL) 25 MG tablet, Take 1 tablet (25 mg total) by mouth 2 (two) times daily., Disp: 180 tablet, Rfl: 3 .  pantoprazole (PROTONIX) 40 MG tablet, Take 1 tablet (40 mg total) by mouth 2 (two) times daily before a meal., Disp: 180 tablet, Rfl: 1 .   tolterodine (DETROL LA) 4 MG 24 hr capsule, Take 1 capsule (4 mg total) by mouth daily., Disp: 90 capsule, Rfl: 3 .  topiramate (TOPAMAX) 25 MG tablet, Take 1 tablet (25 mg total) by mouth 2 (two) times daily., Disp: 180 tablet, Rfl: 1   Review of Systems  Constitutional: Negative for chills and fever.  HENT: Negative for congestion.   Eyes: Negative for blurred vision and double vision.  Respiratory: Negative for cough, hemoptysis, sputum production, shortness of breath and wheezing.   Cardiovascular: Negative for chest pain.  Gastrointestinal: Negative for heartburn and nausea.  Skin: Negative for rash.      Allergies:  Demerol [meperidine]  BP 138/80   Pulse 98   Ht 5\' 3"  (1.6 m)   Wt 176 lb 12.8 oz (80.2 kg)   SpO2 99%   BMI 31.32 kg/m   Physical Examination:   GENERAL:NAD, no fevers, chills, no weakness no fatigue HEAD: Normocephalic, atraumatic.  EYES: Pupils equal, round, reactive to light. Extraocular muscles intact. No scleral icterus.  MOUTH: Moist mucosal membrane. Dentition intact. No abscess noted.  EAR, NOSE, THROAT: Clear without exudates. No external lesions.  + Hoarse voice NECK: Supple. No thyromegaly. No nodules. No JVD.  PULMONARY: CTA B/L no wheezing, rhonchi, crackles CARDIOVASCULAR: S1 and S2. Regular rate and rhythm. No murmurs, rubs, or gallops. No edema. Pedal pulses 2+ bilaterally.  GASTROINTESTINAL: Soft, nontender, nondistended. No masses. Positive bowel sounds. No hepatosplenomegaly.  MUSCULOSKELETAL: No swelling,  clubbing, or edema. Range of motion full in all extremities.  NEUROLOGIC: Cranial nerves II through XII are intact. No gross focal neurological deficits. Sensation intact. Reflexes intact.  SKIN: No ulceration, lesions, rashes, or cyanosis. Skin warm and dry. Turgor intact.  PSYCHIATRIC: Mood, affect within normal limits. The patient is awake, alert and oriented x 3. Insight, judgment intact.  ALL OTHER ROS ARE NEGATIVE        Pulmonary function test 12/11/2015 FEV1 92% FEV1/FVC 80% RV 106% TLC 102% DLCO corrected and her 42% Impression: Nonobstructive process by spirometry, no significant response to bronchodilators. Mild scooping of end expiratory curve, consistent with a mild obstructive process. Overall fits the clinical diagnosis of asthma.  6 minute walk test total distance 1240 feet/378 m low saturation 99%, highest heart rate 78    Assessment and Plan:  62 year old pleasant white female seen today for mild intermittent well-controlled asthma in the setting of well-controlled allergic rhinitis with underlying GERD as it that is also controlled  At this time patient has intermittent hoarse voice despite switching over to [facility] therapy I recommend ENT evaluation for further assessment   Asthma mild intermittent Continue Flovent HFA  albuterol as needed  avoid triggers  Allergic rhinitis Continue Zyrtec Continue intranasal steroids with Flonase  Hoarse voice Recommend ENT referral for further evaluation  Patient  satisfied with Plan of action and management. All questions answered Follow up in 6 months   Anala Whisenant Santiago Glad, M.D.  [facility]  Medical Director [facility] Medical Director [facility] Cardio-Pulmonary Department

## 2018-02-25 NOTE — Patient Instructions (Addendum)
Continue inhalers as prescribed  Can use albuterol 2-4 puffs every 4 hrs as needed for cough/shortness of breath  Referral to ENT for hoarse voice   Continue Flonase and Zyrtec Continue Protonix

## 2018-03-09 ENCOUNTER — Ambulatory Visit: Payer: BLUE CROSS/BLUE SHIELD

## 2018-04-05 ENCOUNTER — Other Ambulatory Visit (INDEPENDENT_AMBULATORY_CARE_PROVIDER_SITE_OTHER): Payer: Self-pay

## 2018-04-05 DIAGNOSIS — R748 Abnormal levels of other serum enzymes: Secondary | ICD-10-CM

## 2018-04-05 LAB — HEPATIC FUNCTION PANEL
ALBUMIN: 4.2 g/dL (ref 3.5–5.2)
ALT: 23 U/L (ref 0–35)
AST: 21 U/L (ref 0–37)
Alkaline Phosphatase: 151 U/L — ABNORMAL HIGH (ref 39–117)
Bilirubin, Direct: 0.1 mg/dL (ref 0.0–0.3)
Total Bilirubin: 0.4 mg/dL (ref 0.2–1.2)
Total Protein: 6.7 g/dL (ref 6.0–8.3)

## 2018-04-06 ENCOUNTER — Other Ambulatory Visit: Payer: Self-pay | Admitting: Internal Medicine

## 2018-04-06 DIAGNOSIS — R748 Abnormal levels of other serum enzymes: Secondary | ICD-10-CM

## 2018-04-06 NOTE — Progress Notes (Signed)
Order placed for f/u labs.  

## 2018-05-19 ENCOUNTER — Other Ambulatory Visit (INDEPENDENT_AMBULATORY_CARE_PROVIDER_SITE_OTHER): Payer: BLUE CROSS/BLUE SHIELD

## 2018-05-19 DIAGNOSIS — R748 Abnormal levels of other serum enzymes: Secondary | ICD-10-CM | POA: Diagnosis not present

## 2018-05-19 LAB — HEPATIC FUNCTION PANEL
ALT: 24 U/L (ref 0–35)
AST: 21 U/L (ref 0–37)
Albumin: 4.6 g/dL (ref 3.5–5.2)
Alkaline Phosphatase: 168 U/L — ABNORMAL HIGH (ref 39–117)
Bilirubin, Direct: 0.1 mg/dL (ref 0.0–0.3)
TOTAL PROTEIN: 6.8 g/dL (ref 6.0–8.3)
Total Bilirubin: 0.6 mg/dL (ref 0.2–1.2)

## 2018-05-19 NOTE — Addendum Note (Signed)
Addended by: Warden Fillers on: 05/19/2018 10:06 AM   Modules accepted: Orders

## 2018-05-21 LAB — ALKALINE PHOSPHATASE, ISOENZYMES
ALK PHOS: 177 IU/L — AB (ref 39–117)
BONE FRACTION: 32 % (ref 14–68)
INTESTINAL FRAC.: 2 % (ref 0–18)
LIVER FRACTION: 66 % (ref 18–85)

## 2018-05-22 ENCOUNTER — Encounter: Payer: Self-pay | Admitting: Internal Medicine

## 2018-05-22 DIAGNOSIS — R748 Abnormal levels of other serum enzymes: Secondary | ICD-10-CM

## 2018-05-22 NOTE — Telephone Encounter (Signed)
Order placed for abdominal ultrasound.   

## 2018-05-28 ENCOUNTER — Telehealth: Payer: Self-pay

## 2018-05-28 NOTE — Telephone Encounter (Signed)
Copied from CRM [id number]. Topic: Referral - Status >> May 28, 2018  4:01 PM Marylen Ponto wrote: Reason for CRM: Pt stated she has not received a call to schedule an appt for the ultrasound. Pt requests call back. Cb# [phone]

## 2018-06-01 ENCOUNTER — Encounter: Payer: Self-pay | Admitting: Internal Medicine

## 2018-06-01 ENCOUNTER — Other Ambulatory Visit: Payer: Self-pay

## 2018-06-01 ENCOUNTER — Ambulatory Visit (INDEPENDENT_AMBULATORY_CARE_PROVIDER_SITE_OTHER): Payer: BLUE CROSS/BLUE SHIELD | Admitting: Internal Medicine

## 2018-06-01 ENCOUNTER — Ambulatory Visit: Payer: Self-pay

## 2018-06-01 ENCOUNTER — Other Ambulatory Visit
Admission: RE | Admit: 2018-06-01 | Discharge: 2018-06-01 | Disposition: A | Discharge status: Home / Self Care | Payer: BLUE CROSS/BLUE SHIELD | Source: Ambulatory Visit | Attending: Internal Medicine | Admitting: Internal Medicine

## 2018-06-01 VITALS — BP 122/88 | HR 94 | Temp 97.9°F | Ht 63.0 in | Wt 173.4 lb

## 2018-06-01 DIAGNOSIS — J209 Acute bronchitis, unspecified: Secondary | ICD-10-CM | POA: Diagnosis not present

## 2018-06-01 DIAGNOSIS — J440 Chronic obstructive pulmonary disease with acute lower respiratory infection: Secondary | ICD-10-CM | POA: Diagnosis not present

## 2018-06-01 DIAGNOSIS — J45901 Unspecified asthma with (acute) exacerbation: Secondary | ICD-10-CM | POA: Diagnosis not present

## 2018-06-01 LAB — RESPIRATORY PANEL BY PCR
Adenovirus: NOT DETECTED
BORDETELLA PERTUSSIS-RVPCR: NOT DETECTED
Chlamydophila pneumoniae: NOT DETECTED
Coronavirus 229E: NOT DETECTED
Coronavirus HKU1: NOT DETECTED
Coronavirus NL63: NOT DETECTED
Coronavirus OC43: NOT DETECTED
INFLUENZA A-RVPPCR: NOT DETECTED
Influenza B: NOT DETECTED
Metapneumovirus: NOT DETECTED
Mycoplasma pneumoniae: NOT DETECTED
PARAINFLUENZA VIRUS 2-RVPPCR: NOT DETECTED
PARAINFLUENZA VIRUS 4-RVPPCR: NOT DETECTED
Parainfluenza Virus 1: NOT DETECTED
Parainfluenza Virus 3: NOT DETECTED
Respiratory Syncytial Virus: NOT DETECTED
Rhinovirus / Enterovirus: DETECTED — AB

## 2018-06-01 MED ORDER — GUAIFENESIN-CODEINE 100-10 MG/5ML PO SOLN: 5.0000 mL | ORAL | 0 refills | Status: DC | PRN

## 2018-06-01 MED ORDER — AZITHROMYCIN 250 MG PO TABS: ORAL_TABLET | ORAL | 0 refills | Status: DC

## 2018-06-01 MED ORDER — ALBUTEROL SULFATE HFA 108 (90 BASE) MCG/ACT IN AERS: 2.0000 | INHALATION_SPRAY | Freq: Four times a day (QID) | RESPIRATORY_TRACT | 5 refills | Status: DC | PRN

## 2018-06-01 MED ORDER — PREDNISONE 20 MG PO TABS: 20.0000 mg | ORAL_TABLET | Freq: Every day | ORAL | 0 refills | Status: DC

## 2018-06-01 NOTE — Telephone Encounter (Signed)
Returned pt's call.  Informed pt that she would need to be evaluated in office before any medications could be called in. Informed pt that her PCP is out of the office today and does not have any available appts for this week.  Pt agreed to schedule an appt w/ FNP.  Appt scheduled for tomorrow (3/11) @ 10:40 am.  Advised pt to go to [facility] or ED if symptoms worsen.

## 2018-06-01 NOTE — Telephone Encounter (Signed)
Pt c/o non productive cough, green nasal discharge, chills, sore throat, headache, fatigue. Denies fever, SOB, or wheezing. Pt has been using Mucinex DM, and inhaler. Pt stated she has had no international travel. Pt requesting antibiotics to be called in. Pt advised that she will need an appointment. Pt asked to send note back to office instead. Care advice given and pt verbalized understanding.    Reason for Disposition . Cough with cold symptoms (e.g., runny nose, postnasal drip, throat clearing)  Answer Assessment - Initial Assessment Questions 1. ONSET: "When did the cough begin?"      Saturday 2. SEVERITY: "How bad is the cough today?"      Used inhaler- coughing episodes that pt is losing breath 3. RESPIRATORY DISTRESS: "Describe your breathing."      No SOB or wheezing 4. FEVER: "Do you have a fever?" If so, ask: "What is your temperature, how was it measured, and when did it start?"     No- has had chills 5. HEMOPTYSIS: "Are you coughing up any blood?" If so ask: "How much?" (flecks, streaks, tablespoons, etc.)     no 6. TREATMENT: "What have you done so far to treat the cough?" (e.g., meds, fluids, humidifier)     Mucinex DM, Albuterol inhaler 7. CARDIAC HISTORY: "Do you have any history of heart disease?" (e.g., heart attack, congestive heart failure)      no 8. LUNG HISTORY: "Do you have any history of lung disease?"  (e.g., pulmonary embolus, asthma, emphysema)     asthma 9. PE RISK FACTORS: "Do you have a history of blood clots?" (or: recent major surgery, recent prolonged travel, bedridden)     no 10. OTHER SYMPTOMS: "Do you have any other symptoms? (e.g., runny nose, wheezing, chest pain)       Runny nose green discharge all day, sore throat, H/A, fatigue 11. PREGNANCY: "Is there any chance you are pregnant?" "When was your last menstrual period?"       n/a 12. TRAVEL: "Have you traveled out of the country in the last month?" (e.g., travel history, exposures)       No  international travel  Protocols used: COUGH - ACUTE NON-PRODUCTIVE-A-AH

## 2018-06-01 NOTE — Progress Notes (Signed)
[facility] Pulmonary Medicine Consultation      MRN# [id number] Savannah Gomez 02/13/1956  Brief History: 63 yo with Hx of Asthma (positive MCH), stopped meds, and presented back to [place] with chronic cough, now on Arnuity with significant improvement.  Dx with ASTHMA 15 years ago Pneumonia 2 years ago Triggers-pollen, cats,dogs  CC  Acute OV for ASTHMA attack    HPI +cough NON-productive  +wheezing +SOB and DOE Started 5 days ago Not getting better  No resp distress +sore throat +sick contacts of family over last 1 month     Current Outpatient Medications:  .  albuterol (PROVENTIL HFA;VENTOLIN HFA) 108 (90 Base) MCG/ACT inhaler, Inhale 2 puffs into the lungs every 6 (six) hours as needed for wheezing or shortness of breath., Disp: 1 Inhaler, Rfl: 5 .  calcium carbonate (CALCIUM 600) 600 MG TABS tablet, Take 600 mg by mouth daily with breakfast., Disp: , Rfl:  .  cetirizine (ZYRTEC) 10 MG tablet, Take 10 mg by mouth daily., Disp: , Rfl:  .  Docusate Calcium (STOOL SOFTENER PO), Take by mouth daily., Disp: , Rfl:  .  fluticasone (FLONASE) 50 MCG/ACT nasal spray, Place 1 spray into the nose daily as needed., Disp: , Rfl:  .  fluticasone (FLOVENT HFA) 110 MCG/ACT inhaler, Inhale 2 puffs into the lungs 2 (two) times daily., Disp: 1 Inhaler, Rfl: 2 .  imipramine (TOFRANIL) 25 MG tablet, Take 1 tablet (25 mg total) by mouth 2 (two) times daily., Disp: 180 tablet, Rfl: 3 .  pantoprazole (PROTONIX) 40 MG tablet, Take 1 tablet (40 mg total) by mouth 2 (two) times daily before a meal., Disp: 180 tablet, Rfl: 1 .  tolterodine (DETROL LA) 4 MG 24 hr capsule, Take 1 capsule (4 mg total) by mouth daily., Disp: 90 capsule, Rfl: 3 .  topiramate (TOPAMAX) 25 MG tablet, Take 1 tablet (25 mg total) by mouth 2 (two) times daily., Disp: 180 tablet, Rfl: 1   Review of Systems  Constitutional: Positive for chills. Negative for fever.  HENT: Positive for congestion.   Eyes: Negative for  blurred vision and double vision.  Respiratory: Positive for cough, shortness of breath and wheezing. Negative for hemoptysis and sputum production.   Cardiovascular: Negative for chest pain.  Gastrointestinal: Negative for heartburn and nausea.  Skin: Negative for rash.  Neurological: Positive for tremors.     BP 122/88   Pulse 94   Temp 97.9 F (36.6 C) (Oral)   Ht 5\' 3"  (1.6 m)   Wt 173 lb 6.4 oz (78.7 kg)   SpO2 98%   BMI 30.72 kg/m   Physical Examination:   GENERAL:NAD, no fevers, +chills,+fatigue HEAD: Normocephalic, atraumatic.  EYES: PERLA, EOMI No scleral icterus.  MOUTH: Moist mucosal membrane.  EAR, NOSE, THROAT: Clear without exudates. No external lesions.  NECK: Supple. No thyromegaly.  No JVD.  PULMONARY: CTA B/L no wheezing, rhonchi, crackles CARDIOVASCULAR: S1 and S2. Regular rate and rhythm. No murmurs GASTROINTESTINAL: Soft, nontender, nondistended. Positive bowel sounds.  MUSCULOSKELETAL: No swelling, clubbing, or edema.  NEUROLOGIC: No gross focal neurological deficits. 5/5 strength all extremities SKIN: No ulceration, lesions, rashes, or cyanosis.  PSYCHIATRIC: Insight, judgment intact. -depression -anxiety ALL OTHER ROS ARE NEGATIVE           Pulmonary function test 12/11/2015 FEV1 92% FEV1/FVC 80% RV 106% TLC 102% DLCO corrected and her 42% Impression: Nonobstructive process by spirometry, no significant response to bronchodilators. Mild scooping of end expiratory curve, consistent with a mild obstructive  process. Overall fits the clinical diagnosis of asthma.  6 minute walk test total distance 1240 feet/378 m low saturation 99%, highest heart rate 78    Assessment and Plan:  63 year old pleasant white female seen today for asthma exacerbation otherwise has mild intermittent well-controlled asthma in the setting of well-controlled allergic rhinitis with underlying GERD which is also controlled  Patient with intermittent hoarse voice ENT  evaluation pending  Mild asthma exacerbation with wheezing-check Resp Virus Panel Prednisone 20 mg daily for 10 days Start Z-Pak Continue albuterol as needed Continue Flovent HFA Avoid triggers Avoid sick contacts Robitussin with codiene as needed   Allergic rhinitis Continue Zyrtec as prescribed Intranasal steroids with Flonase  Hoarse voice Follow-up ENT     Patient  satisfied with Plan of action and management. All questions answered Follow up in 6 months  Patient advised to call if symptoms get worse  Savannah Gomez, M.D.  [facility]  Medical Director [facility] Medical Director [facility] Cardio-Pulmonary Department

## 2018-06-01 NOTE — Patient Instructions (Signed)
Check RESP VIRUS PANEL  START PREDNISONE 20 mg daily for 10 days  Start Z pak  Robitussin with Codeine as needed

## 2018-06-02 ENCOUNTER — Ambulatory Visit: Payer: BLUE CROSS/BLUE SHIELD | Admitting: Family Medicine

## 2018-06-09 ENCOUNTER — Ambulatory Visit
Admission: RE | Admit: 2018-06-09 | Discharge: 2018-06-09 | Disposition: A | Discharge status: Home / Self Care | Payer: BLUE CROSS/BLUE SHIELD | Source: Ambulatory Visit | Attending: Internal Medicine | Admitting: Internal Medicine

## 2018-06-09 ENCOUNTER — Other Ambulatory Visit: Payer: Self-pay

## 2018-06-09 DIAGNOSIS — R748 Abnormal levels of other serum enzymes: Secondary | ICD-10-CM

## 2018-06-10 ENCOUNTER — Other Ambulatory Visit: Payer: Self-pay | Admitting: Internal Medicine

## 2018-06-10 DIAGNOSIS — R748 Abnormal levels of other serum enzymes: Secondary | ICD-10-CM

## 2018-06-10 NOTE — Progress Notes (Signed)
Order placed for GI referral.   

## 2018-07-20 ENCOUNTER — Other Ambulatory Visit (HOSPITAL_COMMUNITY): Payer: Self-pay | Admitting: Unknown Physician Specialty

## 2018-07-20 ENCOUNTER — Other Ambulatory Visit: Payer: Self-pay | Admitting: Unknown Physician Specialty

## 2018-07-20 DIAGNOSIS — R1011 Right upper quadrant pain: Secondary | ICD-10-CM

## 2018-07-20 DIAGNOSIS — R748 Abnormal levels of other serum enzymes: Secondary | ICD-10-CM

## 2018-07-20 DIAGNOSIS — R935 Abnormal findings on diagnostic imaging of other abdominal regions, including retroperitoneum: Secondary | ICD-10-CM

## 2018-07-26 ENCOUNTER — Ambulatory Visit
Admission: RE | Admit: 2018-07-26 | Discharge: 2018-07-26 | Disposition: A | Discharge status: Home / Self Care | Payer: BLUE CROSS/BLUE SHIELD | Source: Ambulatory Visit | Attending: Unknown Physician Specialty | Admitting: Unknown Physician Specialty

## 2018-07-26 ENCOUNTER — Other Ambulatory Visit: Payer: Self-pay

## 2018-07-26 DIAGNOSIS — R935 Abnormal findings on diagnostic imaging of other abdominal regions, including retroperitoneum: Secondary | ICD-10-CM | POA: Insufficient documentation

## 2018-07-26 DIAGNOSIS — R1011 Right upper quadrant pain: Secondary | ICD-10-CM | POA: Diagnosis present

## 2018-07-26 DIAGNOSIS — R748 Abnormal levels of other serum enzymes: Secondary | ICD-10-CM

## 2018-07-26 LAB — POCT I-STAT CREATININE: Creatinine, Ser: 1 mg/dL (ref 0.44–1.00)

## 2018-07-26 MED ORDER — IOHEXOL 300 MG/ML  SOLN
100.0000 mL | Freq: Once | INTRAMUSCULAR | Status: AC | PRN
  Administered 2018-07-26: 11:00:00 100 mL via INTRAVENOUS

## 2018-07-27 ENCOUNTER — Ambulatory Visit (INDEPENDENT_AMBULATORY_CARE_PROVIDER_SITE_OTHER): Payer: BLUE CROSS/BLUE SHIELD | Admitting: General Surgery

## 2018-07-27 ENCOUNTER — Other Ambulatory Visit: Payer: Self-pay

## 2018-07-27 ENCOUNTER — Encounter: Payer: Self-pay | Admitting: General Surgery

## 2018-07-27 VITALS — BP 146/81 | HR 84 | Temp 97.7°F | Resp 14 | Ht 63.0 in | Wt 175.8 lb

## 2018-07-27 DIAGNOSIS — K8020 Calculus of gallbladder without cholecystitis without obstruction: Secondary | ICD-10-CM | POA: Diagnosis not present

## 2018-07-27 NOTE — Progress Notes (Signed)
Patient ID: Savannah Gomez, female   DOB: 03/15/1956, 63 y.o.   MRN: [id number]  Chief Complaint  Patient presents with  . New Patient (Initial Visit)    Gallstones    HPI Savannah Gomez is a 63 y.o. female.  Here today for evaluation of gallstones.  The gallstones were incidentally identified during an abdominal ultrasound obtained for an elevated alkaline phosphatase.  She had been having hepatic function panels every 6 months, normal in June 2019 but showing a mild elevation in November 2019.  This remained the case for the next several determinations.  Alkaline phosphatase isoenzymes showed a normal distribution.  She was evaluated by GI and ultrasound identified gallstones.  She had a CT scan of the abdomen and pelvis yesterday.  The patient had an episode of severe retrosternal pain that went into the back several weeks ago.  Since that time she doubled up on her PPI with modest improvement.  Nausea but no vomiting.  The patient moves her bowels infrequently, in the past once a week, baseline.  At that time, in 2015, calcium channel blockers were felt to be contributing to slow colonic transit no diarrhea.     The patient has no history of dietary intolerance.  Colonoscopy in December 13, 2013 showed diverticulosis. HPI  Past Medical History:  Diagnosis Date  . Allergy   . Arthritis   . Asthma   . Chicken pox   . GERD (gastroesophageal reflux disease)   . Migraine headache   . Urine incontinence     Past Surgical History:  Procedure Laterality Date  . BREAST CYST ASPIRATION Bilateral   . carpal tunnel sugery     bilateral  . ELBOW SURGERY  1999  . HAND SURGERY  1999   broken finger - pins  . NASAL SINUS SURGERY  1992  . ROTATOR CUFF REPAIR  1994  . Trigger finger surgery  1999 and 2000  . VAGINAL HYSTERECTOMY  1987   secondary to fibroids    Family History  Problem Relation Age of Onset  . Colon cancer Mother        colon  . Asthma Mother   . Heart disease  Father   . Hypertension Father   . Colon cancer Sister   . Stroke Maternal Grandfather   . Diabetes Maternal Grandfather   . Lung cancer Maternal Grandmother   . Breast cancer Maternal Grandmother     Social History Social History   Tobacco Use  . Smoking status: Never Smoker  . Smokeless tobacco: Never Used  Substance Use Topics  . Alcohol use: No    Alcohol/week: 0.0 standard drinks  . Drug use: No    Allergies  Allergen Reactions  . Demerol [Meperidine] Nausea Only    Current Outpatient Medications  Medication Sig Dispense Refill  . albuterol (PROVENTIL HFA;VENTOLIN HFA) 108 (90 Base) MCG/ACT inhaler Inhale 2 puffs into the lungs every 6 (six) hours as needed for wheezing or shortness of breath. 1 Inhaler 5  . calcium carbonate (CALCIUM 600) 600 MG TABS tablet Take 600 mg by mouth daily with breakfast.    . cetirizine (ZYRTEC) 10 MG tablet Take 10 mg by mouth daily.    Tery Sanfilippo. Docusate Calcium (STOOL SOFTENER PO) Take by mouth daily.    . fluticasone (FLONASE) 50 MCG/ACT nasal spray Place 1 spray into the nose daily as needed.    . fluticasone (FLOVENT HFA) 110 MCG/ACT inhaler Inhale 2 puffs into the lungs 2 (two) times daily.  1 Inhaler 2  . imipramine (TOFRANIL) 25 MG tablet Take 1 tablet (25 mg total) by mouth 2 (two) times daily. 180 tablet 3  . pantoprazole (PROTONIX) 40 MG tablet Take 1 tablet (40 mg total) by mouth 2 (two) times daily before a meal. 180 tablet 1  . tolterodine (DETROL LA) 4 MG 24 hr capsule Take 1 capsule (4 mg total) by mouth daily. 90 capsule 3  . topiramate (TOPAMAX) 25 MG tablet Take 1 tablet (25 mg total) by mouth 2 (two) times daily. 180 tablet 1   No current facility-administered medications for this visit.     Review of Systems Review of Systems  Constitutional: Negative.   Respiratory: Negative.   Cardiovascular: Negative.     Blood pressure (!) 146/81, pulse 84, temperature 97.7 F (36.5 C), temperature source Temporal, resp. rate 14,  height 5\' 3"  (1.6 m), weight 175 lb 12.8 oz (79.7 kg), SpO2 98 %.  Physical Exam Physical Exam HENT:     Head: Normocephalic and atraumatic.     Nose: Nose normal.     Mouth/Throat:     Pharynx: Oropharynx is clear.  Eyes:     Extraocular Movements: Extraocular movements intact.     Pupils: Pupils are equal, round, and reactive to light.  Neck:     Musculoskeletal: Normal range of motion and neck supple.  Cardiovascular:     Rate and Rhythm: Normal rate and regular rhythm.  Pulmonary:     Effort: Pulmonary effort is normal.     Breath sounds: Normal breath sounds.  Abdominal:     General: Bowel sounds are normal. There is no distension.     Tenderness: There is no abdominal tenderness. There is no guarding.    Musculoskeletal: Normal range of motion.  Skin:    General: Skin is warm and dry.  Neurological:     General: No focal deficit present.     Mental Status: She is alert and oriented to person, place, and time.  Psychiatric:        Mood and Affect: Mood normal.        Behavior: Behavior normal.        Thought Content: Thought content normal.        Judgment: Judgment normal.     Data Reviewed Images from the patient's abdominal ultrasound of Aug 09, 2018 were independently reviewed.  Gallstones, largest 2.2 cm.  No gallbladder wall thickening or sonographic Murphy sign.  Common bile duct 3 mm.  Patent portal vein.  CT of the abdomen and pelvis with contrast completed Jul 26, 2018 was independently reviewed.  No right upper quadrant pathology identified.  Diverticulosis without diverticulitis.  The appendix was not visualized on my review.  Alkaline phosphatase isoenzymes obtained May 19, 2018 showed a total value of 177, normal fractionation.  First elevation of the alkaline phosphatase noted in November 2019.  Remaining liver function studies normal.  Assessment Asymptomatic elevation of the serum alkaline phosphatase without clear relation to a biliary  source.  Cholelithiasis without historical or clinical evidence of cholecystitis.  Plan I spoke with Savannah Prude, MD from GI as well as the patient's primary care physician by phone.  At this time, I do not see strong evidence to suggest cholecystectomy in an asymptomatic individual.  The size of the stone suggest that they been present for some time and is unlikely she will have an issue with choledocholithiasis.  I would plan for repeat hepatic panel in 3 months and reassessment  at that time.  Potential signs of cholecystitis were reviewed with the patient and she was encouraged to call should she develop postprandial pain or dietary intolerance.  Laparoscopic Cholecystectomy with Intraoperative Cholangiogram. The procedure, including it's potential risks and complications (including but not limited to infection, bleeding, injury to intra-abdominal organs or bile ducts, bile leak, poor cosmetic result, sepsis and death) were discussed with the patient in detail.   The patient further understands that if it is technically not possible, or it is unsafe to proceed laparoscopically, that I will convert to an open cholecystectomy.    HPI, Physical Exam, Assessment and Plan have been scribed under the direction and in the presence of Earline Mayotte, MD. Arn Medal, CMA I have completed the exam and reviewed the above documentation for accuracy and completeness.  I agree with the above.  Museum/gallery conservator has been used and any errors in dictation or transcription are unintentional.  Donnalee Curry, M.D., F.A.C.S.  Merrily Pew Dariya Gainer 07/27/2018, 1:56 PM

## 2018-07-27 NOTE — Patient Instructions (Addendum)
Dr.Byrnett will contact Dr.Elliot and give you a call after their conversation.     Cholelithiasis  Cholelithiasis is also called "gallstones." It is a kind of gallbladder disease. The gallbladder is an organ that stores a liquid (bile) that helps you digest fat. Gallstones may not cause symptoms (may be silent gallstones) until they cause a blockage, and then they can cause pain (gallbladder attack). Follow these instructions at home:  Take over-the-counter and prescription medicines only as told by your doctor.  Stay at a healthy weight.  Eat healthy foods. This includes: ? Eating fewer fatty foods, like fried foods. ? Eating fewer refined carbs (refined carbohydrates). Refined carbs are breads and grains that are highly processed, like white bread and white rice. Instead, choose whole grains like whole-wheat bread and brown rice. ? Eating more fiber. Almonds, fresh fruit, and beans are healthy sources of fiber.  Keep all follow-up visits as told by your doctor. This is important. Contact a doctor if:  You have sudden pain in the upper right side of your belly (abdomen). Pain might spread to your right shoulder or your chest. This may be a sign of a gallbladder attack.  You feel sick to your stomach (are nauseous).  You throw up (vomit).  You have been diagnosed with gallstones that have no symptoms and you get: ? Belly pain. ? Discomfort, burning, or fullness in the upper part of your belly (indigestion). Get help right away if:  You have sudden pain in the upper right side of your belly, and it lasts for more than 2 hours.  You have belly pain that lasts for more than 5 hours.  You have a fever or chills.  You keep feeling sick to your stomach or you keep throwing up.  Your skin or the whites of your eyes turn yellow (jaundice).  You have dark-colored pee (urine).  You have light-colored poop (stool). Summary  Cholelithiasis is also called "gallstones."  The  gallbladder is an organ that stores a liquid (bile) that helps you digest fat.  Silent gallstones are gallstones that do not cause symptoms.  A gallbladder attack may cause sudden pain in the upper right side of your belly. Pain might spread to your right shoulder or your chest. If this happens, contact your doctor.  If you have sudden pain in the upper right side of your belly that lasts for more than 2 hours, get help right away. This information is not intended to replace advice given to you by your health care provider. Make sure you discuss any questions you have with your health care provider. Document Released: 08/27/2007 Document Revised: 11/25/2015 Document Reviewed: 11/25/2015 Elsevier Interactive Patient Education  2019 [employer].

## 2018-07-29 ENCOUNTER — Telehealth: Payer: Self-pay | Admitting: *Deleted

## 2018-07-29 NOTE — Telephone Encounter (Signed)
Patient notified per note from Dr. Lemar Livings.   The patient states she does not have a preference on where to have labs drawn.   Schedule for August not open for scheduling appointment with Dr. Lemar Livings yet. Patient will be placed in the recalls.   Patient verbalizes understanding.

## 2018-07-29 NOTE — Telephone Encounter (Signed)
-----   Message from Earline Mayotte, MD sent at 07/29/2018  9:09 AM EDT ----- Arrange for repeat hepatic function panel (she [facility]) and OV with me in three months.  Thanks.

## 2018-08-03 ENCOUNTER — Other Ambulatory Visit: Payer: Self-pay | Admitting: Internal Medicine

## 2018-08-17 ENCOUNTER — Other Ambulatory Visit: Payer: Self-pay | Admitting: Internal Medicine

## 2018-08-25 ENCOUNTER — Inpatient Hospital Stay: Admission: RE | Admit: 2018-08-25 | Payer: BLUE CROSS/BLUE SHIELD | Source: Ambulatory Visit

## 2018-09-02 ENCOUNTER — Ambulatory Visit (INDEPENDENT_AMBULATORY_CARE_PROVIDER_SITE_OTHER): Payer: BLUE CROSS/BLUE SHIELD | Admitting: Internal Medicine

## 2018-09-02 ENCOUNTER — Encounter: Payer: Self-pay | Admitting: Internal Medicine

## 2018-09-02 ENCOUNTER — Other Ambulatory Visit: Payer: Self-pay

## 2018-09-02 VITALS — BP 118/76 | HR 85 | Temp 97.9°F | Resp 16 | Wt 175.0 lb

## 2018-09-02 DIAGNOSIS — R739 Hyperglycemia, unspecified: Secondary | ICD-10-CM

## 2018-09-02 DIAGNOSIS — J4520 Mild intermittent asthma, uncomplicated: Secondary | ICD-10-CM

## 2018-09-02 DIAGNOSIS — K8020 Calculus of gallbladder without cholecystitis without obstruction: Secondary | ICD-10-CM

## 2018-09-02 DIAGNOSIS — Z0000 Encounter for general adult medical examination without abnormal findings: Secondary | ICD-10-CM | POA: Diagnosis not present

## 2018-09-02 DIAGNOSIS — Z1231 Encounter for screening mammogram for malignant neoplasm of breast: Secondary | ICD-10-CM | POA: Diagnosis not present

## 2018-09-02 DIAGNOSIS — Z800 Family history of malignant neoplasm of digestive organs: Secondary | ICD-10-CM

## 2018-09-02 DIAGNOSIS — R748 Abnormal levels of other serum enzymes: Secondary | ICD-10-CM

## 2018-09-02 DIAGNOSIS — K219 Gastro-esophageal reflux disease without esophagitis: Secondary | ICD-10-CM

## 2018-09-02 DIAGNOSIS — E7800 Pure hypercholesterolemia, unspecified: Secondary | ICD-10-CM

## 2018-09-02 MED ORDER — TOLTERODINE TARTRATE ER 4 MG PO CP24: 4.0000 mg | ORAL_CAPSULE | Freq: Every day | ORAL | 3 refills | Status: DC

## 2018-09-02 NOTE — Assessment & Plan Note (Signed)
Scheduled f/u colonoscopy 09/20/18.

## 2018-09-02 NOTE — Assessment & Plan Note (Signed)
Controlled on current regimen.  Sees GI.  Scheduled for EGD 09/20/18.

## 2018-09-02 NOTE — Assessment & Plan Note (Signed)
Low cholesterol diet and exercise.  Follow lipid panel.   

## 2018-09-02 NOTE — Assessment & Plan Note (Signed)
Breathing stable.

## 2018-09-02 NOTE — Assessment & Plan Note (Signed)
Physical today 09/02/18.  Mammogram 09/28/17 - Birads I.  Colonoscopy 12/13/13.  Recommended f/u in 5 years.  Scheduled f/u colonoscopy 09/20/18.  Schedule f/u mammogram.

## 2018-09-02 NOTE — Assessment & Plan Note (Signed)
Just evaluated by Dr Bary Castilla.  Elected to monitor.  Note reviewed.

## 2018-09-02 NOTE — Progress Notes (Signed)
Patient ID: Savannah Gomez, female   DOB: 09/03/1955, 63 y.o.   MRN: [id number]   Subjective:    Patient ID: Savannah Gomez, female    DOB: 09/03/1955, 63 y.o.   MRN: [id number]  HPI  Patient here for her physical exam.  She reports she is doing relatively well.  Recently evaluated for elevated alkaline phos. Ultrasound revealed cholelithiasis.  No acute cholecystitis.  Saw GI.  Had CT scan - diverticulosis.  No acute findings.  Saw Dr Lemar LivingsByrnett.  Note reviewed.  Recommended to continue to monitor - liver panel.  Has previously had some intermittent discomfort that goes through to her back.  None since her visit with Dr Lemar LivingsByrnett. no chest pain or tightness with increased activity or exertion. Breathing stable.  No cough, fever or chest congestion.  No acid reflux.  Controlled.  No significant abdominal pain.  Bowels moving.  No blood in her stool.  Handling stress.  Trying to stay in due to covid restrictions.     Past Medical History:  Diagnosis Date  . Allergy   . Arthritis   . Asthma   . Chicken pox   . GERD (gastroesophageal reflux disease)   . Migraine headache   . Urine incontinence    Past Surgical History:  Procedure Laterality Date  . BREAST CYST ASPIRATION Bilateral   . carpal tunnel sugery     bilateral  . ELBOW SURGERY  1999  . HAND SURGERY  1999   broken finger - pins  . NASAL SINUS SURGERY  1992  . ROTATOR CUFF REPAIR  1994  . Trigger finger surgery  1999 and 2000  . VAGINAL HYSTERECTOMY  1987   secondary to fibroids   Family History  Problem Relation Age of Onset  . Colon cancer Mother        colon  . Asthma Mother   . Heart disease Father   . Hypertension Father   . Colon cancer Sister   . Stroke Maternal Grandfather   . Diabetes Maternal Grandfather   . Lung cancer Maternal Grandmother   . Breast cancer Maternal Grandmother    Social History   Socioeconomic History  . Marital status: Married    Spouse name: Not on file  . Number of children: Not  on file  . Years of education: Not on file  . Highest education level: Not on file  Occupational History  . Not on file  Social Needs  . Financial resource strain: Not on file  . Food insecurity    Worry: Not on file    Inability: Not on file  . Transportation needs    Medical: Not on file    Non-medical: Not on file  Tobacco Use  . Smoking status: Never Smoker  . Smokeless tobacco: Never Used  Substance and Sexual Activity  . Alcohol use: No    Alcohol/week: 0.0 standard drinks  . Drug use: No  . Sexual activity: Not on file  Lifestyle  . Physical activity    Days per week: Not on file    Minutes per session: Not on file  . Stress: Not on file  Relationships  . Social Musicianconnections    Talks on phone: Not on file    Gets together: Not on file    Attends religious service: Not on file    Active member of club or organization: Not on file    Attends meetings of clubs or organizations: Not on file    Relationship status:  Not on file  Other Topics Concern  . Not on file  Social History Narrative  . Not on file    Outpatient Encounter Medications as of 09/02/2018  Medication Sig  . albuterol (PROVENTIL HFA;VENTOLIN HFA) 108 (90 Base) MCG/ACT inhaler Inhale 2 puffs into the lungs every 6 (six) hours as needed for wheezing or shortness of breath.  . calcium carbonate (CALCIUM 600) 600 MG TABS tablet Take 600 mg by mouth daily with breakfast.  . cetirizine (ZYRTEC) 10 MG tablet Take 10 mg by mouth daily.  Tery Sanfilippo. Docusate Calcium (STOOL SOFTENER PO) Take by mouth daily.  . fluticasone (FLONASE) 50 MCG/ACT nasal spray Place 1 spray into the nose daily as needed.  . fluticasone (FLOVENT HFA) 110 MCG/ACT inhaler Inhale 2 puffs into the lungs 2 (two) times daily.  Marland Kitchen. imipramine (TOFRANIL) 25 MG tablet Take 1 tablet (25 mg total) by mouth 2 (two) times daily.  . pantoprazole (PROTONIX) 40 MG tablet TAKE 1 TABLET BY MOUTH TWICE A DAY BEFORE A MEAL  . tolterodine (DETROL LA) 4 MG 24 hr  capsule Take 1 capsule (4 mg total) by mouth daily.  Marland Kitchen. topiramate (TOPAMAX) 25 MG tablet TAKE 1 TABLET BY MOUTH TWICE A DAY  . [DISCONTINUED] tolterodine (DETROL LA) 4 MG 24 hr capsule Take 1 capsule (4 mg total) by mouth daily.   No facility-administered encounter medications on file as of 09/02/2018.     Review of Systems  Constitutional: Negative for appetite change and unexpected weight change.  HENT: Negative for congestion and sinus pressure.   Eyes: Negative for pain and visual disturbance.  Respiratory: Negative for cough and chest tightness.        Breathing stable.    Cardiovascular: Negative for chest pain, palpitations and leg swelling.  Gastrointestinal: Negative for abdominal pain, diarrhea, nausea and vomiting.  Genitourinary: Negative for difficulty urinating and dysuria.  Musculoskeletal: Negative for joint swelling and myalgias.  Skin: Negative for color change and rash.  Neurological: Negative for dizziness, light-headedness and headaches.  Hematological: Negative for adenopathy. Does not bruise/bleed easily.  Psychiatric/Behavioral: Negative for agitation and dysphoric mood.       Objective:    Physical Exam Constitutional:      General: She is not in acute distress.    Appearance: Normal appearance. She is well-developed.  HENT:     Right Ear: External ear normal.     Left Ear: External ear normal.  Eyes:     General: No scleral icterus.       Right eye: No discharge.        Left eye: No discharge.  Neck:     Musculoskeletal: Neck supple. No muscular tenderness.     Thyroid: No thyromegaly.  Cardiovascular:     Rate and Rhythm: Normal rate and regular rhythm.  Pulmonary:     Effort: No tachypnea, accessory muscle usage or respiratory distress.     Breath sounds: Normal breath sounds. No decreased breath sounds or wheezing.  Chest:     Breasts:        Right: No inverted nipple, mass, nipple discharge or tenderness (no axillary adenopathy).         Left: No inverted nipple, mass, nipple discharge or tenderness (no axilarry adenopathy).  Abdominal:     General: Bowel sounds are normal.     Palpations: Abdomen is soft.     Tenderness: There is no abdominal tenderness.  Musculoskeletal:        General: No swelling or  tenderness.  Lymphadenopathy:     Cervical: No cervical adenopathy.  Skin:    Findings: No erythema or rash.  Neurological:     Mental Status: She is alert and oriented to person, place, and time.  Psychiatric:        Mood and Affect: Mood normal.        Behavior: Behavior normal.     BP 118/76   Pulse 85   Temp 97.9 F (36.6 C) (Oral)   Resp 16   Wt 175 lb (79.4 kg)   SpO2 99%   BMI 31.00 kg/m  Wt Readings from Last 3 Encounters:  09/02/18 175 lb (79.4 kg)  07/27/18 175 lb 12.8 oz (79.7 kg)  06/01/18 173 lb 6.4 oz (78.7 kg)     Lab Results  Component Value Date   WBC 5.3 04/02/2017   HGB 13.9 04/02/2017   HCT 41.7 04/02/2017   PLT 241.0 04/02/2017   GLUCOSE 100 (H) 01/29/2018   CHOL 191 01/29/2018   TRIG 74.0 01/29/2018   HDL 81.20 01/29/2018   LDLCALC 95 01/29/2018   ALT 24 05/19/2018   AST 21 05/19/2018   NA 140 01/29/2018   K 4.0 01/29/2018   CL 107 01/29/2018   CREATININE 1.00 07/26/2018   BUN 12 01/29/2018   CO2 25 01/29/2018   TSH 2.29 09/14/2017   HGBA1C 5.5 01/29/2018    Ct Abdomen Pelvis W Contrast  Result Date: 07/26/2018 CLINICAL DATA:  Right upper quadrant pain and nausea. Elevated alkaline phosphatase and GGT levels. EXAM: CT ABDOMEN AND PELVIS WITH CONTRAST TECHNIQUE: Multidetector CT imaging of the abdomen and pelvis was performed using the standard protocol following bolus administration of intravenous contrast. CONTRAST:  100mL OMNIPAQUE IOHEXOL 300 MG/ML  SOLN COMPARISON:  09/10/2013 FINDINGS: Lower Chest: No acute findings. Hepatobiliary: No hepatic masses identified. Gallbladder is unremarkable. No evidence of biliary ductal dilatation. Pancreas:  No mass or inflammatory  changes. Spleen: Within normal limits in size and appearance. Adrenals/Urinary Tract: No masses identified. Tiny cysts again noted in the lower pole of the left kidney and upper pole of the right kidney. A 3 mm nonobstructing calculus is again seen in the lower pole of the left kidney. No evidence of hydronephrosis. Stomach/Bowel: No evidence of obstruction, inflammatory process or abnormal fluid collections. Diverticulosis is seen mainly involving the descending and sigmoid colon, however there is no evidence of diverticulitis. Vascular/Lymphatic: No pathologically enlarged lymph nodes. No abdominal aortic aneurysm. Reproductive: Prior hysterectomy noted. Adnexal regions are unremarkable in appearance. Other:  None. Musculoskeletal:  No suspicious bone lesions identified. IMPRESSION: 1. No acute findings.  No evidence of hepatobiliary disease. 2. Colonic diverticulosis. No radiographic evidence of diverticulitis. 3. Tiny nonobstructing left renal calculus. Electronically Signed   By: Myles RosenthalJohn  Stahl M.D.   On: 07/26/2018 13:51       Assessment & Plan:   Problem List Items Addressed This Visit    Asthma    Breathing stable.        Elevated alkaline phosphatase level    Has seen GI with w/up as outlined.  Had abdominal ultrasound - gallstones.  CT - diverticulosis.  No acute abnormality.  Saw Dr Lemar LivingsByrnett.  Elected to monitor.  Schedule f/u liver panel.        Relevant Orders   Hepatic function panel   Family history of colon cancer    Scheduled f/u colonoscopy 09/20/18.        Gallstones    Just evaluated by Dr Lemar LivingsByrnett.  Elected  to monitor.  Note reviewed.       GERD (gastroesophageal reflux disease)    Controlled on current regimen.  Sees GI.  Scheduled for EGD 09/20/18.        Health care maintenance    Physical today 09/02/18.  Mammogram 09/28/17 - Birads I.  Colonoscopy 12/13/13.  Recommended f/u in 5 years.  Scheduled f/u colonoscopy 09/20/18.  Schedule f/u mammogram.         Hypercholesteremia    Low cholesterol diet and exercise.  Follow lipid panel.       Relevant Orders   CBC with Differential/Platelet   Lipid panel   Basic metabolic panel    Other Visit Diagnoses    Hyperglycemia    -  Primary   Relevant Orders   Hemoglobin A1c   Visit for screening mammogram       Relevant Orders   MM 3D SCREEN BREAST BILATERAL       Einar Pheasant, MD

## 2018-09-02 NOTE — Assessment & Plan Note (Signed)
Has seen GI with w/up as outlined.  Had abdominal ultrasound - gallstones.  CT - diverticulosis.  No acute abnormality.  Saw Dr Bary Castilla.  Elected to monitor.  Schedule f/u liver panel.

## 2018-09-03 ENCOUNTER — Ambulatory Visit: Payer: BLUE CROSS/BLUE SHIELD | Admitting: Internal Medicine

## 2018-09-03 ENCOUNTER — Telehealth: Payer: Self-pay

## 2018-09-03 ENCOUNTER — Other Ambulatory Visit (INDEPENDENT_AMBULATORY_CARE_PROVIDER_SITE_OTHER): Payer: BLUE CROSS/BLUE SHIELD

## 2018-09-03 ENCOUNTER — Other Ambulatory Visit: Payer: Self-pay | Admitting: Internal Medicine

## 2018-09-03 DIAGNOSIS — R300 Dysuria: Secondary | ICD-10-CM

## 2018-09-03 LAB — POCT URINALYSIS DIPSTICK
Bilirubin, UA: NEGATIVE
Glucose, UA: NEGATIVE
Ketones, UA: NEGATIVE
Nitrite, UA: NEGATIVE
Protein, UA: NEGATIVE
Spec Grav, UA: 1.015 (ref 1.010–1.025)
Urobilinogen, UA: 0.2 E.U./dL
pH, UA: 6 (ref 5.0–8.0)

## 2018-09-03 NOTE — Telephone Encounter (Signed)
Copied from [place] [id number]. Topic: General - Other >> Sep 03, 2018  9:42 AM Rainey Pines A wrote: Patient is requesting a callback from [place] in regards to her medication and appointment yesterday.

## 2018-09-03 NOTE — Progress Notes (Signed)
Order placed for urine tests.

## 2018-09-04 ENCOUNTER — Other Ambulatory Visit: Payer: Self-pay | Admitting: Internal Medicine

## 2018-09-04 LAB — URINALYSIS, MICROSCOPIC ONLY
Bacteria, UA: NONE SEEN /HPF
Hyaline Cast: NONE SEEN /LPF
Squamous Epithelial / HPF: NONE SEEN /HPF (ref <=5)
WBC, UA: >=60 /HPF — AB (ref 0–5)

## 2018-09-04 LAB — URINE CULTURE
MICRO NUMBER:: 564486
SPECIMEN QUALITY:: ADEQUATE

## 2018-09-04 MED ORDER — CEFDINIR 300 MG PO CAPS: 300.0000 mg | ORAL_CAPSULE | Freq: Two times a day (BID) | ORAL | 0 refills | Status: DC

## 2018-09-04 NOTE — Progress Notes (Signed)
Pt notified of urine results.  Culture pending.  Given symptoms and initial urine results, abx sent in to [facility].    Dr Nicki Reaper

## 2018-09-07 NOTE — Telephone Encounter (Signed)
See result note.  

## 2018-09-08 ENCOUNTER — Other Ambulatory Visit: Payer: Self-pay | Admitting: Internal Medicine

## 2018-09-08 DIAGNOSIS — N200 Calculus of kidney: Secondary | ICD-10-CM

## 2018-09-08 DIAGNOSIS — R319 Hematuria, unspecified: Secondary | ICD-10-CM

## 2018-09-08 NOTE — Progress Notes (Signed)
Order placed for urology referral.  

## 2018-09-10 ENCOUNTER — Encounter: Payer: Self-pay | Admitting: *Deleted

## 2018-09-10 ENCOUNTER — Telehealth: Payer: Self-pay

## 2018-09-10 NOTE — Telephone Encounter (Signed)
See my chart message

## 2018-09-10 NOTE — Telephone Encounter (Signed)
Copied from [place] [id number]. Topic: General - Other >> Sep 10, 2018 10:13 AM Celene Kras A wrote: Reason for CRM: Pt called to return Cathy's call. Please advise.

## 2018-09-14 ENCOUNTER — Encounter: Payer: Self-pay | Admitting: Internal Medicine

## 2018-09-15 ENCOUNTER — Other Ambulatory Visit
Admission: RE | Admit: 2018-09-15 | Discharge: 2018-09-15 | Disposition: A | Discharge status: Home / Self Care | Payer: BLUE CROSS/BLUE SHIELD | Source: Ambulatory Visit | Attending: Unknown Physician Specialty | Admitting: Unknown Physician Specialty

## 2018-09-15 ENCOUNTER — Other Ambulatory Visit: Payer: Self-pay

## 2018-09-15 DIAGNOSIS — Z1159 Encounter for screening for other viral diseases: Secondary | ICD-10-CM | POA: Diagnosis present

## 2018-09-16 LAB — NOVEL CORONAVIRUS, NAA (HOSP ORDER, SEND-OUT TO REF LAB; TAT 18-24 HRS): SARS-CoV-2, NAA: NOT DETECTED

## 2018-09-19 ENCOUNTER — Encounter: Payer: Self-pay | Admitting: Anesthesiology

## 2018-09-20 ENCOUNTER — Ambulatory Visit
Admission: RE | Admit: 2018-09-20 | Discharge: 2018-09-20 | Disposition: A | Discharge status: Home / Self Care | Payer: BLUE CROSS/BLUE SHIELD | Attending: Unknown Physician Specialty | Admitting: Unknown Physician Specialty

## 2018-09-20 ENCOUNTER — Other Ambulatory Visit: Payer: Self-pay

## 2018-09-20 ENCOUNTER — Encounter
Admission: RE | Disposition: A | Discharge status: Home / Self Care | Payer: Self-pay | Source: Home / Self Care | Attending: Unknown Physician Specialty

## 2018-09-20 ENCOUNTER — Ambulatory Visit: Payer: BLUE CROSS/BLUE SHIELD | Admitting: Anesthesiology

## 2018-09-20 ENCOUNTER — Encounter: Payer: Self-pay | Admitting: Emergency Medicine

## 2018-09-20 DIAGNOSIS — Z833 Family history of diabetes mellitus: Secondary | ICD-10-CM | POA: Diagnosis not present

## 2018-09-20 DIAGNOSIS — K640 First degree hemorrhoids: Secondary | ICD-10-CM | POA: Insufficient documentation

## 2018-09-20 DIAGNOSIS — K219 Gastro-esophageal reflux disease without esophagitis: Secondary | ICD-10-CM | POA: Insufficient documentation

## 2018-09-20 DIAGNOSIS — Z885 Allergy status to narcotic agent status: Secondary | ICD-10-CM | POA: Diagnosis not present

## 2018-09-20 DIAGNOSIS — K2970 Gastritis, unspecified, without bleeding: Secondary | ICD-10-CM | POA: Insufficient documentation

## 2018-09-20 DIAGNOSIS — Z90710 Acquired absence of both cervix and uterus: Secondary | ICD-10-CM | POA: Diagnosis not present

## 2018-09-20 DIAGNOSIS — K449 Diaphragmatic hernia without obstruction or gangrene: Secondary | ICD-10-CM | POA: Insufficient documentation

## 2018-09-20 DIAGNOSIS — Z801 Family history of malignant neoplasm of trachea, bronchus and lung: Secondary | ICD-10-CM | POA: Diagnosis not present

## 2018-09-20 DIAGNOSIS — Z823 Family history of stroke: Secondary | ICD-10-CM | POA: Diagnosis not present

## 2018-09-20 DIAGNOSIS — Z86010 Personal history of colonic polyps: Secondary | ICD-10-CM | POA: Diagnosis not present

## 2018-09-20 DIAGNOSIS — Z825 Family history of asthma and other chronic lower respiratory diseases: Secondary | ICD-10-CM | POA: Diagnosis not present

## 2018-09-20 DIAGNOSIS — Z79899 Other long term (current) drug therapy: Secondary | ICD-10-CM | POA: Diagnosis not present

## 2018-09-20 DIAGNOSIS — Z8249 Family history of ischemic heart disease and other diseases of the circulatory system: Secondary | ICD-10-CM | POA: Diagnosis not present

## 2018-09-20 DIAGNOSIS — Z800 Family history of malignant neoplasm of digestive organs: Secondary | ICD-10-CM | POA: Insufficient documentation

## 2018-09-20 DIAGNOSIS — M1990 Unspecified osteoarthritis, unspecified site: Secondary | ICD-10-CM | POA: Insufficient documentation

## 2018-09-20 DIAGNOSIS — K5730 Diverticulosis of large intestine without perforation or abscess without bleeding: Secondary | ICD-10-CM | POA: Insufficient documentation

## 2018-09-20 DIAGNOSIS — K635 Polyp of colon: Secondary | ICD-10-CM | POA: Diagnosis not present

## 2018-09-20 DIAGNOSIS — G43909 Migraine, unspecified, not intractable, without status migrainosus: Secondary | ICD-10-CM | POA: Insufficient documentation

## 2018-09-20 DIAGNOSIS — Z1211 Encounter for screening for malignant neoplasm of colon: Secondary | ICD-10-CM | POA: Diagnosis not present

## 2018-09-20 DIAGNOSIS — J45909 Unspecified asthma, uncomplicated: Secondary | ICD-10-CM | POA: Insufficient documentation

## 2018-09-20 DIAGNOSIS — Z7951 Long term (current) use of inhaled steroids: Secondary | ICD-10-CM | POA: Insufficient documentation

## 2018-09-20 DIAGNOSIS — Z803 Family history of malignant neoplasm of breast: Secondary | ICD-10-CM | POA: Insufficient documentation

## 2018-09-20 HISTORY — PX: COLONOSCOPY WITH PROPOFOL: SHX5780

## 2018-09-20 HISTORY — PX: ESOPHAGOGASTRODUODENOSCOPY (EGD) WITH PROPOFOL: SHX5813

## 2018-09-20 LAB — HM COLONOSCOPY

## 2018-09-20 SURGERY — COLONOSCOPY WITH PROPOFOL
Anesthesia: General

## 2018-09-20 MED ORDER — PROPOFOL 500 MG/50ML IV EMUL
INTRAVENOUS | Status: AC
  Filled 2018-09-20: qty 50

## 2018-09-20 MED ORDER — SODIUM CHLORIDE 0.9 % IV SOLN
INTRAVENOUS | Status: DC | PRN
  Administered 2018-09-20: 09:00:00 via INTRAVENOUS

## 2018-09-20 MED ORDER — SODIUM CHLORIDE 0.9 % IV SOLN
INTRAVENOUS | Status: DC
  Administered 2018-09-20: 1000 mL via INTRAVENOUS

## 2018-09-20 MED ORDER — PROPOFOL 500 MG/50ML IV EMUL
INTRAVENOUS | Status: DC | PRN
  Administered 2018-09-20: 120 ug/kg/min via INTRAVENOUS

## 2018-09-20 MED ORDER — LIDOCAINE HCL (CARDIAC) PF 100 MG/5ML IV SOSY
PREFILLED_SYRINGE | INTRAVENOUS | Status: DC | PRN
  Administered 2018-09-20: 50 mg via INTRAVENOUS

## 2018-09-20 MED ORDER — PROPOFOL 10 MG/ML IV BOLUS
INTRAVENOUS | Status: AC
  Filled 2018-09-20: qty 20

## 2018-09-20 MED ORDER — SODIUM CHLORIDE 0.9 % IV SOLN: INTRAVENOUS | Status: DC

## 2018-09-20 MED ORDER — PROPOFOL 10 MG/ML IV BOLUS
INTRAVENOUS | Status: DC | PRN
  Administered 2018-09-20 (×4): 20 mg via INTRAVENOUS
  Administered 2018-09-20: 40 mg via INTRAVENOUS

## 2018-09-20 NOTE — Anesthesia Preprocedure Evaluation (Signed)
Anesthesia Evaluation  Patient identified by MRN, date of birth, ID band Patient awake    Reviewed: Allergy & Precautions, NPO status , Patient's Chart, lab work & pertinent test results, reviewed documented beta blocker date and time   Airway Mallampati: II  TM Distance: >3 FB     Dental  (+) Chipped   Pulmonary shortness of breath, asthma ,           Cardiovascular      Neuro/Psych  Headaches,    GI/Hepatic GERD  ,  Endo/Other    Renal/GU Renal disease     Musculoskeletal  (+) Arthritis ,   Abdominal   Peds  Hematology   Anesthesia Other Findings   Reproductive/Obstetrics                             Anesthesia Physical Anesthesia Plan  ASA: III  Anesthesia Plan: General   Post-op Pain Management:    Induction: Intravenous  PONV Risk Score and Plan:   Airway Management Planned:   Additional Equipment:   Intra-op Plan:   Post-operative Plan:   Informed Consent: I have reviewed the patients History and Physical, chart, labs and discussed the procedure including the risks, benefits and alternatives for the proposed anesthesia with the patient or authorized representative who has indicated his/her understanding and acceptance.       Plan Discussed with: CRNA  Anesthesia Plan Comments:         Anesthesia Quick Evaluation

## 2018-09-20 NOTE — Transfer of Care (Signed)
Immediate Anesthesia Transfer of Care Note  Patient: Savannah Gomez  Procedure(s) Performed: COLONOSCOPY WITH PROPOFOL (N/A ) ESOPHAGOGASTRODUODENOSCOPY (EGD) WITH PROPOFOL (N/A )  Patient Location: PACU  Anesthesia Type:MAC  Level of Consciousness: awake, alert  and oriented  Airway & Oxygen Therapy: Patient Spontanous Breathing  Post-op Assessment: Report given to RN and Post -op Vital signs reviewed and stable  Post vital signs: Reviewed and stable  Last Vitals:  Vitals Value Taken Time  BP    Temp    Pulse 74 09/20/18 1010  Resp 19 09/20/18 1010  SpO2 100 % 09/20/18 1010  Vitals shown include unvalidated device data.  Last Pain:  Vitals:   09/20/18 0827  TempSrc: Tympanic  PainSc: 0-No pain         Complications: No apparent anesthesia complications

## 2018-09-20 NOTE — Anesthesia Postprocedure Evaluation (Signed)
Anesthesia Post Note  Patient: Savannah Gomez  Procedure(s) Performed: COLONOSCOPY WITH PROPOFOL (N/A ) ESOPHAGOGASTRODUODENOSCOPY (EGD) WITH PROPOFOL (N/A )  Patient location during evaluation: Endoscopy Anesthesia Type: General Level of consciousness: awake and alert Pain management: pain level controlled Vital Signs Assessment: post-procedure vital signs reviewed and stable Respiratory status: spontaneous breathing, nonlabored ventilation, respiratory function stable and patient connected to nasal cannula oxygen Cardiovascular status: blood pressure returned to baseline and stable Postop Assessment: no apparent nausea or vomiting Anesthetic complications: no     Last Vitals:  Vitals:   09/20/18 0827 09/20/18 1010  BP: 132/71 120/65  Pulse: 89 87  Resp: 17 17  Temp: (!) 34.8 C (!) 36.3 C  SpO2: 100% 100%    Last Pain:  Vitals:   09/20/18 1030  TempSrc:   PainSc: 0-No pain                 Antony Sian S

## 2018-09-20 NOTE — H&P (Signed)
Primary Care Physician:  Einar Pheasant, MD Primary Gastroenterologist:  Dr. Vira Agar  Pre-Procedure History & Physical: HPI:  Savannah Gomez is a 63 y.o. female is here for an endoscopy and colonoscopy.  Done for RUQ pain, possible Barretts, PH colon polyps.   Past Medical History:  Diagnosis Date  . Allergy   . Arthritis   . Asthma   . Chicken pox   . GERD (gastroesophageal reflux disease)   . Migraine headache   . Urine incontinence     Past Surgical History:  Procedure Laterality Date  . BREAST CYST ASPIRATION Bilateral   . carpal tunnel sugery     bilateral  . ELBOW SURGERY  1999  . HAND SURGERY  1999   broken finger - pins  . NASAL SINUS SURGERY  1992  . ROTATOR CUFF REPAIR  1994  . Trigger finger surgery  1999 and 2000  . VAGINAL HYSTERECTOMY  1987   secondary to fibroids    Prior to Admission medications   Medication Sig Start Date End Date Taking? Authorizing Provider  acetaminophen (TYLENOL) 500 MG tablet Take 500 mg by mouth as needed.   Yes [provider]  albuterol (PROVENTIL HFA;VENTOLIN HFA) 108 (90 Base) MCG/ACT inhaler Inhale 2 puffs into the lungs every 6 (six) hours as needed for wheezing or shortness of breath. 06/01/18  Yes Kasa, Maretta Bees, MD  Calcium Carb-Cholecalciferol (CALCIUM CARBONATE-VITAMIN D3 PO) Take 400 Units by mouth.   Yes [provider]  calcium carbonate (CALCIUM 600) 600 MG TABS tablet Take 600 mg by mouth daily with breakfast.   Yes [provider]  cefdinir (OMNICEF) 300 MG capsule Take 1 capsule (300 mg total) by mouth 2 (two) times daily. 09/04/18  Yes Einar Pheasant, MD  cetirizine (ZYRTEC) 10 MG tablet Take 10 mg by mouth daily.   Yes [provider]  Cholecalciferol 25 MCG (1000 UT) tablet Take 1,000 Units by mouth daily.   Yes [provider]  Docusate Calcium (STOOL SOFTENER PO) Take by mouth daily.   Yes [provider]  fluticasone (FLONASE) 50 MCG/ACT nasal spray  Place 1 spray into the nose daily as needed. 01/30/15  Yes [provider]  imipramine (TOFRANIL) 25 MG tablet Take 1 tablet (25 mg total) by mouth 2 (two) times daily. 11/16/17  Yes Stoioff, Ronda Fairly, MD  Magnesium Oxide 400 (240 Mg) MG TABS Take 1 tablet by mouth daily.   Yes [provider]  pantoprazole (PROTONIX) 40 MG tablet TAKE 1 TABLET BY MOUTH TWICE A DAY BEFORE A MEAL 08/03/18  Yes Einar Pheasant, MD  tolterodine (DETROL LA) 4 MG 24 hr capsule Take 1 capsule (4 mg total) by mouth daily. 09/02/18  Yes Einar Pheasant, MD  topiramate (TOPAMAX) 50 MG tablet Take 50 mg by mouth 2 (two) times daily.   Yes [provider]  vitamin C (ASCORBIC ACID) 500 MG tablet Take 500 mg by mouth daily.   Yes [provider]  fluticasone (FLOVENT HFA) 110 MCG/ACT inhaler Inhale 2 puffs into the lungs 2 (two) times daily. 07/28/17 07/28/18  Flora Lipps, MD  topiramate (TOPAMAX) 25 MG tablet TAKE 1 TABLET BY MOUTH TWICE A DAY 08/20/18   Einar Pheasant, MD    Allergies as of 08/10/2018 - Review Complete 07/27/2018  Allergen Reaction Noted  . Demerol [meperidine] Nausea Only 01/16/2012    Family History  Problem Relation Age of Onset  . Colon cancer Mother        colon  .  Asthma Mother   . Heart disease Father   . Hypertension Father   . Colon cancer Sister   . Stroke Maternal Grandfather   . Diabetes Maternal Grandfather   . Lung cancer Maternal Grandmother   . Breast cancer Maternal Grandmother     Social History   Socioeconomic History  . Marital status: Married    Spouse name: Not on file  . Number of children: Not on file  . Years of education: Not on file  . Highest education level: Not on file  Occupational History  . Not on file  Social Needs  . Financial resource strain: Not on file  . Food insecurity    Worry: Not on file    Inability: Not on file  . Transportation needs    Medical: Not on file    Non-medical: Not on file  Tobacco Use  .  Smoking status: Never Smoker  . Smokeless tobacco: Never Used  Substance and Sexual Activity  . Alcohol use: No    Alcohol/week: 0.0 standard drinks  . Drug use: No  . Sexual activity: Not on file  Lifestyle  . Physical activity    Days per week: Not on file    Minutes per session: Not on file  . Stress: Not on file  Relationships  . Social Musicianconnections    Talks on phone: Not on file    Gets together: Not on file    Attends religious service: Not on file    Active member of club or organization: Not on file    Attends meetings of clubs or organizations: Not on file    Relationship status: Not on file  . Intimate partner violence    Fear of current or ex partner: Not on file    Emotionally abused: Not on file    Physically abused: Not on file    Forced sexual activity: Not on file  Other Topics Concern  . Not on file  Social History Narrative  . Not on file    Review of Systems: See HPI, otherwise negative ROS  Physical Exam: BP 132/71   Pulse 89   Temp (!) 94.7 F (34.8 C) (Tympanic)   Resp 17   Ht 5\' 3"  (1.6 m)   Wt 79.4 kg   SpO2 100%   BMI 31.00 kg/m  General:   Alert,  pleasant and cooperative in NAD Head:  Normocephalic and atraumatic. Neck:  Supple; no masses or thyromegaly. Lungs:  Clear throughout to auscultation.    Heart:  Regular rate and rhythm. Abdomen:  Soft, nontender and nondistended. Normal bowel sounds, without guarding, and without rebound.   Neurologic:  Alert and  oriented x4;  grossly normal neurologically.  Impression/Plan: Wende NeighborsCynthia H Kosik is here for an endoscopy and colonoscopy to be performed for RUQ pain, PH colon polyps, possible Barretts esophagus.  Risks, benefits, limitations, and alternatives regarding  endoscopy and colonoscopy have been reviewed with the patient.  Questions have been answered.  All parties agreeable.   Lynnae PrudeELLIOTT, ROBERT, MD  09/20/2018, 9:18 AM

## 2018-09-20 NOTE — Anesthesia Post-op Follow-up Note (Signed)
Anesthesia QCDR form completed.        

## 2018-09-20 NOTE — Op Note (Signed)
[facility]astroenter[facility] Patient Name: Savannah Gomez Procedure Date: 09/20/2018 9:20 AM MRN: [id number] Account #: [id number] Date of Birth: 24-Jun-1955 Admit Type: Outpatient Age: [id number] [facility] 1 Gender: Female Note Status: Finalized Procedure:            Upper GI endoscopy Indications:          Abdominal pain in the right upper quadrant, Heartburn Providers:            Scot Junobert T. Elliott, MD Referring MD:         Dale Durhamharlene Scott, MD (Referring MD) Medicines:            Propofol per Anesthesia Complications:        No immediate complications. Procedure:            Pre-Anesthesia Assessment:                       - After reviewing the risks and benefits, the patient                        was deemed in satisfactory condition to undergo the                        procedure.                       After obtaining informed consent, the endoscope was                        passed under direct vision. Throughout the procedure,                        the patient's blood pressure, pulse, and oxygen                        saturations were monitored continuously. The Endoscope                        was introduced through the mouth, and advanced to the                        second part of duodenum. The upper GI endoscopy was                        accomplished without difficulty. The patient tolerated                        the procedure well. Findings:      A medium-sized hiatal hernia was present. ESOPHAGUS otherwise normal.      Localized mild inflammation characterized by erosions, erythema and       granularity was found in the gastric body. Biopsies were taken with a       cold forceps for histology. Biopsies were taken with a cold forceps for       Helicobacter pylori testing.      The examined duodenum was normal. Impression:           - Medium-sized hiatal hernia.                       - Gastritis. Biopsied.                       -  Normal examined  duodenum. Recommendation:       - Await pathology results. Manya Silvas, MD 09/20/2018 9:36:48 AM This report has been signed electronically. Number of Addenda: 0 Note Initiated On: 09/20/2018 9:20 AM      [facility]

## 2018-09-20 NOTE — Op Note (Signed)
[facility] Gastroenterology Patient Name: Savannah Gomez Procedure Date: 09/20/2018 9:19 AM MRN: [id number] Account #: [id number] Date of Birth: [birth year] Admit Type: Outpatient Age: 63 Room: [facility] ENDO ROOM 1 Gender: Female Note Status: Finalized Procedure:            Colonoscopy Indications:          High risk colon cancer surveillance: Personal history                        of colonic polyps Providers:            Manya Silvas, MD Referring MD:         Einar Pheasant, MD (Referring MD) Medicines:            Propofol per Anesthesia Complications:        No immediate complications. Procedure:            Pre-Anesthesia Assessment:                       - After reviewing the risks and benefits, the patient                        was deemed in satisfactory condition to undergo the                        procedure.                       After obtaining informed consent, the colonoscope was                        passed under direct vision. Throughout the procedure,                        the patient's blood pressure, pulse, and oxygen                        saturations were monitored continuously. The                        Colonoscope was introduced through the anus and                        advanced to the the cecum, identified by appendiceal                        orifice and ileocecal valve. The colonoscopy was                        performed without difficulty. The patient tolerated the                        procedure well. The quality of the bowel preparation                        was adequate to identify polyps. Findings:      A small polyp was found in the mid ascending colon. The polyp was       sessile. This was a remnant of a previous larger polyp. The polyp was       removed with a hot snare. Resection and retrieval  were complete.       Biopsies were taken with a cold forceps for histology. To prevent       bleeding after the polypectomy, one  hemostatic clip was successfully       placed. There was no bleeding during, or at the end, of the procedure.      A few small-mouthed diverticula were found in the sigmoid colon.      Internal hemorrhoids were found during endoscopy. The hemorrhoids were       small and Grade I (internal hemorrhoids that do not prolapse). Impression:           - One small polyp in the mid ascending colon, removed                        with a hot snare. Resected and retrieved. Biopsied.                        Clip was placed.                       - Diverticulosis in the sigmoid colon.                       - Internal hemorrhoids. Recommendation:       - Await pathology results. Scot Junobert T Elliott, MD 09/20/2018 10:14:40 AM This report has been signed electronically. Number of Addenda: 0 Note Initiated On: 09/20/2018 9:19 AM Scope Withdrawal Time: 0 hours 10 minutes 20 seconds  Total Procedure Duration: 0 hours 31 minutes 22 seconds       [facility]

## 2018-09-21 ENCOUNTER — Encounter: Payer: Self-pay | Admitting: Unknown Physician Specialty

## 2018-09-21 LAB — SURGICAL PATHOLOGY

## 2018-09-30 ENCOUNTER — Encounter: Payer: Self-pay | Admitting: Internal Medicine

## 2018-09-30 ENCOUNTER — Other Ambulatory Visit (INDEPENDENT_AMBULATORY_CARE_PROVIDER_SITE_OTHER): Payer: BLUE CROSS/BLUE SHIELD

## 2018-09-30 ENCOUNTER — Other Ambulatory Visit: Payer: Self-pay

## 2018-09-30 DIAGNOSIS — R739 Hyperglycemia, unspecified: Secondary | ICD-10-CM

## 2018-09-30 DIAGNOSIS — E7800 Pure hypercholesterolemia, unspecified: Secondary | ICD-10-CM | POA: Diagnosis not present

## 2018-09-30 DIAGNOSIS — R748 Abnormal levels of other serum enzymes: Secondary | ICD-10-CM

## 2018-09-30 LAB — LIPID PANEL
Cholesterol: 210 mg/dL — ABNORMAL HIGH (ref 0–200)
HDL: 85.5 mg/dL (ref >39.00)
LDL Cholesterol: 108 mg/dL — ABNORMAL HIGH (ref 0–99)
NonHDL: 124.24
Total CHOL/HDL Ratio: 2
Triglycerides: 80 mg/dL (ref 0.0–149.0)
VLDL: 16 mg/dL (ref 0.0–40.0)

## 2018-09-30 LAB — HEPATIC FUNCTION PANEL
ALT: 18 U/L (ref 0–35)
AST: 16 U/L (ref 0–37)
Albumin: 4.5 g/dL (ref 3.5–5.2)
Alkaline Phosphatase: 141 U/L — ABNORMAL HIGH (ref 39–117)
Bilirubin, Direct: 0.1 mg/dL (ref 0.0–0.3)
Total Bilirubin: 0.5 mg/dL (ref 0.2–1.2)
Total Protein: 6.6 g/dL (ref 6.0–8.3)

## 2018-09-30 LAB — BASIC METABOLIC PANEL
BUN: 12 mg/dL (ref 6–23)
CO2: 27 mEq/L (ref 19–32)
Calcium: 9.3 mg/dL (ref 8.4–10.5)
Chloride: 107 mEq/L (ref 96–112)
Creatinine, Ser: 0.99 mg/dL (ref 0.40–1.20)
GFR: 56.43 mL/min — ABNORMAL LOW (ref >60.00)
Glucose, Bld: 92 mg/dL (ref 70–99)
Potassium: 3.9 mEq/L (ref 3.5–5.1)
Sodium: 141 mEq/L (ref 135–145)

## 2018-09-30 LAB — CBC WITH DIFFERENTIAL/PLATELET
Basophils Absolute: 0 10*3/uL (ref 0.0–0.1)
Basophils Relative: 0.6 % (ref 0.0–3.0)
Eosinophils Absolute: 0.4 10*3/uL (ref 0.0–0.7)
Eosinophils Relative: 10 % — ABNORMAL HIGH (ref 0.0–5.0)
HCT: 41 % (ref 36.0–46.0)
Hemoglobin: 13.8 g/dL (ref 12.0–15.0)
Lymphocytes Relative: 37 % (ref 12.0–46.0)
Lymphs Abs: 1.5 10*3/uL (ref 0.7–4.0)
MCHC: 33.7 g/dL (ref 30.0–36.0)
MCV: 86.7 fl (ref 78.0–100.0)
Monocytes Absolute: 0.2 10*3/uL (ref 0.1–1.0)
Monocytes Relative: 6.1 % (ref 3.0–12.0)
Neutro Abs: 1.9 10*3/uL (ref 1.4–7.7)
Neutrophils Relative %: 46.3 % (ref 43.0–77.0)
Platelets: 224 10*3/uL (ref 150.0–400.0)
RBC: 4.73 Mil/uL (ref 3.87–5.11)
RDW: 14.4 % (ref 11.5–15.5)
WBC: 4 10*3/uL (ref 4.0–10.5)

## 2018-09-30 LAB — HEMOGLOBIN A1C: Hgb A1c MFr Bld: 5.5 % (ref 4.6–6.5)

## 2018-10-04 ENCOUNTER — Ambulatory Visit
Admission: RE | Admit: 2018-10-04 | Discharge: 2018-10-04 | Disposition: A | Discharge status: Home / Self Care | Payer: BLUE CROSS/BLUE SHIELD | Source: Ambulatory Visit | Attending: Internal Medicine | Admitting: Internal Medicine

## 2018-10-04 ENCOUNTER — Other Ambulatory Visit: Payer: Self-pay

## 2018-10-04 DIAGNOSIS — Z1231 Encounter for screening mammogram for malignant neoplasm of breast: Secondary | ICD-10-CM | POA: Diagnosis not present

## 2018-10-11 ENCOUNTER — Encounter: Payer: Self-pay | Admitting: General Surgery

## 2018-10-26 ENCOUNTER — Other Ambulatory Visit: Payer: Self-pay

## 2018-10-26 ENCOUNTER — Ambulatory Visit (INDEPENDENT_AMBULATORY_CARE_PROVIDER_SITE_OTHER): Payer: BLUE CROSS/BLUE SHIELD | Admitting: Urology

## 2018-10-26 ENCOUNTER — Encounter: Payer: Self-pay | Admitting: Urology

## 2018-10-26 VITALS — BP 130/78 | HR 76 | Ht 63.0 in | Wt 175.0 lb

## 2018-10-26 DIAGNOSIS — N3010 Interstitial cystitis (chronic) without hematuria: Secondary | ICD-10-CM

## 2018-10-26 DIAGNOSIS — N200 Calculus of kidney: Secondary | ICD-10-CM

## 2018-10-26 LAB — URINALYSIS, COMPLETE
Bilirubin, UA: NEGATIVE
Glucose, UA: NEGATIVE
Ketones, UA: NEGATIVE
Leukocytes,UA: NEGATIVE
Nitrite, UA: NEGATIVE
Protein,UA: NEGATIVE
RBC, UA: NEGATIVE
Specific Gravity, UA: 1.01 (ref 1.005–1.030)
Urobilinogen, Ur: 0.2 mg/dL (ref 0.2–1.0)
pH, UA: 7.5 (ref 5.0–7.5)

## 2018-10-26 LAB — MICROSCOPIC EXAMINATION
Bacteria, UA: NONE SEEN
Epithelial Cells (non renal): NONE SEEN /hpf (ref 0–10)
RBC: NONE SEEN /hpf (ref 0–2)
WBC, UA: NONE SEEN /hpf (ref 0–5)

## 2018-10-26 MED ORDER — IMIPRAMINE HCL 25 MG PO TABS: 25.0000 mg | ORAL_TABLET | Freq: Two times a day (BID) | ORAL | 3 refills | Status: DC

## 2018-10-26 NOTE — Progress Notes (Signed)
10/26/2018 1:14 PM   Savannah NeighborsCynthia H Gomez 04-03-1955 [id number]  Referring provider: Dale DurhamScott, Charlene, MD [address] [place] [id number]  Chief Complaint  Patient presents with  . Nephrolithiasis  . Hematuria    Urologic history: 1.  Interstitial cystitis - Imipramine 50 mg daily - Tolterodine 4 mg daily  2.  Left nephrolithiasis - 4 mm nonobstructing left lower pole calculus  HPI: 63 y.o. female presents for annual follow-up though she was asked to follow-up earlier due to microhematuria.  She was evaluated by GI in March 2020 for abdominal pain and elevated alkaline phosphatase and had an abdominal ultrasound which showed a small renal cyst.  A CT scan was subsequently performed in May 2020 which showed small bilateral renal cysts and a 3 mm nonobstructing left lower pole calculus in addition to cholelithiasis.    She had onset of superpubic discomfort and dysuria in June 2020.  Urinalysis showed large blood and 1+ leukocytes on dipstick however microscopy showed >60 WBCs and 3-10 RBCs.  Urine culture grew 10,000 colonies of a single organism.  She was treated with antibiotics and states her symptoms resolved.  She has no complaints today.  She remains on imipramine and tolterodine for her interstitial cystitis.   PMH: Past Medical History:  Diagnosis Date  . Allergy   . Arthritis   . Asthma   . Chicken pox   . GERD (gastroesophageal reflux disease)   . Migraine headache   . Urine incontinence     Surgical History: Past Surgical History:  Procedure Laterality Date  . BREAST CYST ASPIRATION Bilateral    neg  . carpal tunnel sugery     bilateral  . COLONOSCOPY WITH PROPOFOL N/A 09/20/2018   Procedure: COLONOSCOPY WITH PROPOFOL;  Surgeon: Scot JunElliott, Robert T, MD;  [facility];  Service: Endoscopy;  Laterality: N/A;  . ELBOW SURGERY  1999  . ESOPHAGOGASTRODUODENOSCOPY (EGD) WITH PROPOFOL N/A 09/20/2018   Procedure:  ESOPHAGOGASTRODUODENOSCOPY (EGD) WITH PROPOFOL;  Surgeon: Scot JunElliott, Robert T, MD;  [facility] END[facility];  Service: Endoscopy;  Laterality: N/A;  . HAND SURGERY  1999   broken finger - pins  . NASAL SINUS SURGERY  1992  . ROTATOR CUFF REPAIR  1994  . Trigger finger surgery  1999 and 2000  . VAGINAL HYSTERECTOMY  1987   secondary to fibroids    Home Medications:  Allergies as of 10/26/2018      Reactions   Demerol [meperidine] Nausea Only      Medication List       Accurate as of October 26, 2018  1:14 PM. If you have any questions, ask your nurse or doctor.        STOP taking these medications   vitamin C 500 MG tablet Commonly known as: ASCORBIC ACID Stopped by: Riki AltesScott C Stoioff, MD     TAKE these medications   acetaminophen 500 MG tablet Commonly known as: TYLENOL Take 500 mg by mouth as needed.   albuterol 108 (90 Base) MCG/ACT inhaler Commonly known as: VENTOLIN HFA Inhale 2 puffs into the lungs every 6 (six) hours as needed for wheezing or shortness of breath.   Calcium 600 600 MG Tabs tablet Generic drug: calcium carbonate Take 600 mg by mouth daily with breakfast.   CALCIUM CARBONATE-VITAMIN D3 PO Take 400 Units by mouth.   cefdinir 300 MG capsule Commonly known as: OMNICEF Take 1 capsule (300 mg total) by mouth 2 (two) times daily.   cetirizine 10 MG  tablet Commonly known as: ZYRTEC Take 10 mg by mouth daily.   Cholecalciferol 25 MCG (1000 UT) tablet Take 1,000 Units by mouth daily.   fluticasone 110 MCG/ACT inhaler Commonly known as: FLOVENT HFA Inhale 2 puffs into the lungs 2 (two) times daily.   fluticasone 50 MCG/ACT nasal spray Commonly known as: FLONASE Place 1 spray into the nose daily as needed.   imipramine 25 MG tablet Commonly known as: TOFRANIL Take 1 tablet (25 mg total) by mouth 2 (two) times daily.   Magnesium Oxide 400 (240 Mg) MG Tabs Take 1 tablet by mouth daily.   pantoprazole 40 MG tablet Commonly known as: PROTONIX  TAKE 1 TABLET BY MOUTH TWICE A DAY BEFORE A MEAL   STOOL SOFTENER PO Take by mouth daily.   tolterodine 4 MG 24 hr capsule Commonly known as: DETROL LA Take 1 capsule (4 mg total) by mouth daily.   topiramate 25 MG tablet Commonly known as: TOPAMAX TAKE 1 TABLET BY MOUTH TWICE A DAY What changed: Another medication with the same name was removed. Continue taking this medication, and follow the directions you see here. Changed by: Riki AltesScott C Stoioff, MD       Allergies:  Allergies  Allergen Reactions  . Demerol [Meperidine] Nausea Only    Family History: Family History  Problem Relation Age of Onset  . Colon cancer Mother        colon  . Asthma Mother   . Heart disease Father   . Hypertension Father   . Colon cancer Sister   . Stroke Maternal Grandfather   . Diabetes Maternal Grandfather   . Lung cancer Maternal Grandmother   . Breast cancer Maternal Grandmother     Social History:  reports that she has never smoked. She has never used smokeless tobacco. She reports that she does not drink alcohol or use drugs.  ROS: UROLOGY Frequent Urination?: Yes Hard to postpone urination?: Yes Burning/pain with urination?: No Get up at night to urinate?: Yes Leakage of urine?: Yes Urine stream starts and stops?: No Trouble starting stream?: No Do you have to strain to urinate?: No Blood in urine?: No Urinary tract infection?: No Sexually transmitted disease?: No Injury to kidneys or bladder?: No Painful intercourse?: No Weak stream?: No Currently pregnant?: No Vaginal bleeding?: No Last menstrual period?: Postmenopausal  Gastrointestinal Nausea?: Yes Vomiting?: No Indigestion/heartburn?: Yes Diarrhea?: No Constipation?: Yes  Constitutional Fever: No Night sweats?: No Weight loss?: No Fatigue?: No  Skin Skin rash/lesions?: No Itching?: Yes  Eyes Blurred vision?: No Double vision?: No  Ears/Nose/Throat Sore throat?: No Sinus problems?: Yes   Hematologic/Lymphatic Swollen glands?: No Easy bruising?: Yes  Cardiovascular Leg swelling?: No Chest pain?: No  Respiratory Cough?: No Shortness of breath?: Yes  Endocrine Excessive thirst?: No  Musculoskeletal Back pain?: Yes Joint pain?: No  Neurological Headaches?: Yes Dizziness?: No  Psychologic Depression?: No Anxiety?: No  Physical Exam: BP 130/78 (BP Location: Left Arm, Patient Position: Sitting, Cuff Size: Normal)   Pulse 76   Ht 5\' 3"  (1.6 m)   Wt 175 lb (79.4 kg)   BMI 31.00 kg/m   Constitutional:  Alert and oriented, No acute distress. HEENT: [place] AT, moist mucus membranes.  Trachea midline, no masses. Cardiovascular: No clubbing, cyanosis, or edema. Respiratory: Normal respiratory effort, no increased work of breathing. Skin: No rashes, bruises or suspicious lesions. Neurologic: Grossly intact, no focal deficits, moving all 4 extremities. Psychiatric: Normal mood and affect.  Laboratory Data:  Urinalysis Dipstick/microscopy negative  Assessment & Plan:    - Microhematuria Although her urine culture in June was negative she had significant pyuria and was symptomatic.  Her microhematuria was minimal.  Symptoms resolved with antibiotic treatment and urinalysis today is completely clear.  Would not recommend any further evaluation.  - Interstitial cystitis Stable.  Imipramine and tolterodine were refilled  - Nephrolithiasis Small, nonobstructing left lower pole renal calculus.  Continue annual follow-up   Abbie Sons, MD  [facility] [address] [place], [place] [address] [phone]

## 2018-11-17 ENCOUNTER — Ambulatory Visit: Payer: BLUE CROSS/BLUE SHIELD | Admitting: Urology

## 2018-11-18 ENCOUNTER — Other Ambulatory Visit: Payer: Self-pay

## 2018-11-18 ENCOUNTER — Encounter: Payer: Self-pay | Admitting: Internal Medicine

## 2018-11-18 ENCOUNTER — Ambulatory Visit (INDEPENDENT_AMBULATORY_CARE_PROVIDER_SITE_OTHER): Payer: BLUE CROSS/BLUE SHIELD | Admitting: Internal Medicine

## 2018-11-18 DIAGNOSIS — J4520 Mild intermittent asthma, uncomplicated: Secondary | ICD-10-CM

## 2018-11-18 MED ORDER — FLUTICASONE PROPIONATE HFA 110 MCG/ACT IN AERO: 2.0000 | INHALATION_SPRAY | Freq: Two times a day (BID) | RESPIRATORY_TRACT | 10 refills | Status: DC

## 2018-11-18 NOTE — Patient Instructions (Signed)
Continue Flovent as prescribed Avoid allergens   Instructions to patient-  albuterol as needed if you can't catch your breath by resting or doing a relaxed purse lip breathing - Ok to use up to 2 puffs  every 4 hours if you must but call for immediate appointment if use goes up over your usual need - Don't leave home without it !!     Note your daily symptoms > remember "red flags"   >>>Increase in cough >>>increase in sputum production >>>increase in shortness of breath or activity  intolerance.    If you notice these symptoms, please call the office for assessment

## 2018-11-18 NOTE — Progress Notes (Addendum)
[facility] Pul[facility] Medicine Consultation     I connected with the patient by VIDEO enabled telemedicine visit and verified that I am speaking with the correct person using two identifiers.    I discussed the limitations, risks, security and privacy concerns of performing an evaluation and management service by telemedicine and the availability of in-person appointments. I also discussed with the patient that there may be a patient responsible charge related to this service. The patient expressed understanding and agreed to proceed.  PATIENT AGREES AND CONFIRMS -YES   Other persons participating in the visit and their role in the encounter: Patient, nursing  This visit type was conducted due to national recommendations for restrictions regarding the COVID-19 Pandemic (e.g. social distancing).  This format is felt to be most appropriate for this patient at this time.  All issues noted in this document were discussed and addressed.        MRN# [id number] Savannah Gomez March 24, 1956  Brief History: 63 yo with Hx of Asthma (positive MCH), stopped meds, and presented back to [facility] with chronic cough, now on Arnuity with significant improvement.  Dx with ASTHMA 15 years ago Pneumonia 2 years ago Triggers-pollen, cats,dogs  CC  Follow up Asthma    HPI Mild intermittent cough and wheezing previoius OV patient has asthma exacerbation and responded really well to steroids and ABX  Some DOE with climbing stairs No obvious distress at this time No evidence of infection at this time    Started 5 days ago Not getting better  No resp distress +sore throat +sick contacts of family over last 64 month  Triggers seasonal, smoke, exercise, cats/dogs Carpet in bedrooms   Current Outpatient Medications:  .  acetaminophen (TYLENOL) 500 MG tablet, Take 500 mg by mouth as needed., Disp: , Rfl:  .  albuterol (PROVENTIL HFA;VENTOLIN HFA) 108 (90 Base) MCG/ACT inhaler, Inhale 2 puffs into  the lungs every 6 (six) hours as needed for wheezing or shortness of breath., Disp: 1 Inhaler, Rfl: 5 .  Calcium Carb-Cholecalciferol (CALCIUM CARBONATE-VITAMIN D3 PO), Take 400 Units by mouth., Disp: , Rfl:  .  calcium carbonate (CALCIUM 600) 600 MG TABS tablet, Take 600 mg by mouth daily with breakfast., Disp: , Rfl:  .  cefdinir (OMNICEF) 300 MG capsule, Take 1 capsule (300 mg total) by mouth 2 (two) times daily., Disp: 10 capsule, Rfl: 0 .  cetirizine (ZYRTEC) 10 MG tablet, Take 10 mg by mouth daily., Disp: , Rfl:  .  Cholecalciferol 25 MCG (1000 UT) tablet, Take 1,000 Units by mouth daily., Disp: , Rfl:  .  Docusate Calcium (STOOL SOFTENER PO), Take by mouth daily., Disp: , Rfl:  .  fluticasone (FLONASE) 50 MCG/ACT nasal spray, Place 1 spray into the nose daily as needed., Disp: , Rfl:  .  fluticasone (FLOVENT HFA) 110 MCG/ACT inhaler, Inhale 2 puffs into the lungs 2 (two) times daily., Disp: 1 Inhaler, Rfl: 2 .  imipramine (TOFRANIL) 25 MG tablet, Take 1 tablet (25 mg total) by mouth 2 (two) times daily., Disp: 180 tablet, Rfl: 3 .  Magnesium Oxide 400 (240 Mg) MG TABS, Take 1 tablet by mouth daily., Disp: , Rfl:  .  pantoprazole (PROTONIX) 40 MG tablet, TAKE 1 TABLET BY MOUTH TWICE A DAY BEFORE A MEAL, Disp: 180 tablet, Rfl: 1 .  tolterodine (DETROL LA) 4 MG 24 hr capsule, Take 1 capsule (4 mg total) by mouth daily., Disp: 90 capsule, Rfl: 3 .  topiramate (TOPAMAX) 25 MG tablet, TAKE  1 TABLET BY MOUTH TWICE A DAY, Disp: 180 tablet, Rfl: 1      Pulmonary function test 12/11/2015 FEV1 92% FEV1/FVC 80% RV 106% TLC 102% DLCO corrected and her 42% Impression: Nonobstructive process by spirometry, no significant response to bronchodilators. Mild scooping of end expiratory curve, consistent with a mild obstructive process. Overall fits the clinical diagnosis of asthma.  6 minute walk test total distance 1240 feet/378 m low saturation 99%, highest heart rate 78    Assessment and Plan:   MILD intromittent ASTHMA Continue inhaled steroids with Flovent 110 HFA Albuterol as needed Avoid triggers Bed sheets in drier to kill dust mites is an option   Allergic rhinitis Continue zyrtec and intranasal Flonase Working well symptoms under control  GERD under control   Instructions to patient-  albuterol as needed if you can't catch your breath by resting or doing a relaxed purse lip breathing - Ok to use up to 2 puffs  every 4 hours if you must but call for immediate appointment if use goes up over your usual need - Don't leave home without it !!     Note your daily symptoms > remember "red flags"  >>>Increase in cough >>>increase in sputum production >>>increase in shortness of breath or activity  intolerance.    If you notice these symptoms, please call the office for assessment   COVID-19 EDUCATION: The signs and symptoms of COVID-19 were discussed with the patient and how to seek care for testing.  The importance of social distancing was discussed today. Hand Washing Techniques and avoid touching face was advised.  MEDICATION ADJUSTMENTS/LABS AND TESTS ORDERED: Continue Flovent as prescribed Avoid allergens   CURRENT MEDICATIONS REVIEWED AT LENGTH WITH PATIENT TODAY   Patient satisfied with Plan of action and management. All questions answered  Follow up in 6 months  Total time spent 25 mins  Wallis BambergKurian Santiago Gladavid Rielly Corlett, M.D.  [facility]  Medical Director [facility] Medical Director [facility] Cardio-Pulmonary Department

## 2019-01-25 ENCOUNTER — Other Ambulatory Visit: Payer: Self-pay | Admitting: Internal Medicine

## 2019-02-01 ENCOUNTER — Telehealth: Payer: Self-pay | Admitting: Internal Medicine

## 2019-02-01 NOTE — Telephone Encounter (Signed)
The pharm needs a rx for this pt to get shringix vaccine fax to [phone]

## 2019-02-02 MED ORDER — ZOSTER VAC RECOMB ADJUVANTED 50 MCG/0.5ML IM SUSR: 0.5000 mL | Freq: Once | INTRAMUSCULAR | 0 refills | Status: DC

## 2019-02-02 NOTE — Telephone Encounter (Signed)
rx e-scribed 

## 2019-02-02 NOTE — Telephone Encounter (Signed)
Pt aware.

## 2019-02-21 ENCOUNTER — Other Ambulatory Visit: Payer: Self-pay | Admitting: Internal Medicine

## 2019-03-04 ENCOUNTER — Ambulatory Visit (INDEPENDENT_AMBULATORY_CARE_PROVIDER_SITE_OTHER): Payer: BLUE CROSS/BLUE SHIELD | Admitting: Internal Medicine

## 2019-03-04 ENCOUNTER — Ambulatory Visit: Payer: BLUE CROSS/BLUE SHIELD | Admitting: Internal Medicine

## 2019-03-04 ENCOUNTER — Other Ambulatory Visit: Payer: Self-pay

## 2019-03-04 ENCOUNTER — Encounter: Payer: Self-pay | Admitting: Internal Medicine

## 2019-03-04 VITALS — Ht 63.0 in | Wt 174.0 lb

## 2019-03-04 DIAGNOSIS — E7800 Pure hypercholesterolemia, unspecified: Secondary | ICD-10-CM

## 2019-03-04 DIAGNOSIS — R748 Abnormal levels of other serum enzymes: Secondary | ICD-10-CM

## 2019-03-04 DIAGNOSIS — J4520 Mild intermittent asthma, uncomplicated: Secondary | ICD-10-CM | POA: Diagnosis not present

## 2019-03-04 DIAGNOSIS — K8020 Calculus of gallbladder without cholecystitis without obstruction: Secondary | ICD-10-CM | POA: Diagnosis not present

## 2019-03-04 DIAGNOSIS — R739 Hyperglycemia, unspecified: Secondary | ICD-10-CM

## 2019-03-04 DIAGNOSIS — K219 Gastro-esophageal reflux disease without esophagitis: Secondary | ICD-10-CM

## 2019-03-04 DIAGNOSIS — N3010 Interstitial cystitis (chronic) without hematuria: Secondary | ICD-10-CM

## 2019-03-04 DIAGNOSIS — R1084 Generalized abdominal pain: Secondary | ICD-10-CM

## 2019-03-04 NOTE — Progress Notes (Signed)
Patient ID: Savannah NeighborsCynthia H Shiley, female   DOB: October 17, 1955, 63 y.o.   MRN: [id number]   Virtual Visit via video Note  This visit type was conducted due to national recommendations for restrictions regarding the COVID-19 pandemic (e.g. social distancing).  This format is felt to be most appropriate for this patient at this time.  All issues noted in this document were discussed and addressed.  No physical exam was performed (except for noted visual exam findings with Video Visits).   I connected with Zenda Alpersynthia Castor by a video enabled telemedicine application and verified that I am speaking with the correct person using two identifiers. Location patient: home Location provider: work  Persons participating in the virtual visit: patient, provider  I discussed the limitations, risks, security and privacy concerns of performing an evaluation and management service by telephone and the availability of in person appointments.  The patient expressed understanding and agreed to proceed.   Reason for visit: scheduled follow up.   HPI: Here for a scheduled follow up.  Reports doing relatively well.  Still having some abdominal discomfort.  Occurs after eating.  May occur one time per week. Had ultrasound and CT scan recently.  No acute abnormality.   Is s/p colonoscopy 08/2018 - one polyp, diverticulosis and internal hemorrhoids.  Saw GI 09/2018 for this discomfort.  Had xray - question stool burden as etiology.  States she is having bowel movements.  Has not noticed a significant problem with constipation.  Takes a stool softener.  Discussed adding miralax.  No vomiting.  Some occasional nausea.  Does report occurs after eating.  Saw Dr Lemar LivingsByrnett regarding her gallbladder.  Elected to monitor.  On protonix.  Feels acid reflux is controlled.  Saw Dr Belia HemanKasa 11/18/18.  Recommended continuing flovent and zyrtec. Breathing stable.  Saw Dr Lonna CobbStoioff 10/26/18.  Follow up interstitial cystitis.  Recommended continuing imipramine  and tolterodine.  Stable. Handling stress.  No increased heart rate or palpitations.  No chest pain.     ROS: See pertinent positives and negatives per HPI.  Past Medical History:  Diagnosis Date   Allergy    Arthritis    Asthma    Chicken pox    GERD (gastroesophageal reflux disease)    Migraine headache    Urine incontinence     Past Surgical History:  Procedure Laterality Date   BREAST CYST ASPIRATION Bilateral    neg   carpal tunnel sugery     bilateral   COLONOSCOPY WITH PROPOFOL N/A 09/20/2018   Procedure: COLONOSCOPY WITH PROPOFOL;  Surgeon: Scot JunElliott, Robert T, MD;  [facility] ENDOSCOPY;  Service: Endoscopy;  Laterality: N/A;   ELBOW SURGERY  1999   ESOPHAGOGASTRODUODENOSCOPY (EGD) WITH PROPOFOL N/A 09/20/2018   Procedure: ESOPHAGOGASTRODUODENOSCOPY (EGD) WITH PROPOFOL;  Surgeon: Scot JunElliott, Robert T, MD;  [facility] END[facility];  Service: Endoscopy;  Laterality: N/A;   HAND SURGERY  1999   broken finger - pins   NASAL SINUS SURGERY  1992   ROTATOR CUFF REPAIR  1994   Trigger finger surgery  1999 and 2000   VAGINAL HYSTERECTOMY  1987   secondary to fibroids    Family History  Problem Relation Age of Onset   Colon cancer Mother        colon   Asthma Mother    Heart disease Father    Hypertension Father    Colon cancer Sister    Stroke Maternal Grandfather    Diabetes Maternal Grandfather    Lung cancer Maternal Grandmother  Breast cancer Maternal Grandmother     SOCIAL HX: reviewed.    Current Outpatient Medications:    acetaminophen (TYLENOL) 500 MG tablet, Take 500 mg by mouth as needed., Disp: , Rfl:    albuterol (PROVENTIL HFA;VENTOLIN HFA) 108 (90 Base) MCG/ACT inhaler, Inhale 2 puffs into the lungs every 6 (six) hours as needed for wheezing or shortness of breath., Disp: 1 Inhaler, Rfl: 5   Calcium Carb-Cholecalciferol (CALCIUM CARBONATE-VITAMIN D3 PO), Take 400 Units by mouth., Disp: , Rfl:    calcium carbonate  (CALCIUM 600) 600 MG TABS tablet, Take 600 mg by mouth daily with breakfast., Disp: , Rfl:    cefdinir (OMNICEF) 300 MG capsule, Take 1 capsule (300 mg total) by mouth 2 (two) times daily., Disp: 10 capsule, Rfl: 0   cetirizine (ZYRTEC) 10 MG tablet, Take 10 mg by mouth daily., Disp: , Rfl:    Cholecalciferol 25 MCG (1000 UT) tablet, Take 1,000 Units by mouth daily., Disp: , Rfl:    Docusate Calcium (STOOL SOFTENER PO), Take by mouth daily., Disp: , Rfl:    fluticasone (FLONASE) 50 MCG/ACT nasal spray, Place 1 spray into the nose daily as needed., Disp: , Rfl:    fluticasone (FLOVENT HFA) 110 MCG/ACT inhaler, Inhale 2 puffs into the lungs 2 (two) times daily., Disp: 1 g, Rfl: 10   imipramine (TOFRANIL) 25 MG tablet, Take 1 tablet (25 mg total) by mouth 2 (two) times daily., Disp: 180 tablet, Rfl: 3   Magnesium Oxide 400 (240 Mg) MG TABS, Take 1 tablet by mouth daily., Disp: , Rfl:    pantoprazole (PROTONIX) 40 MG tablet, TAKE 1 TABLET BY MOUTH TWICE A DAY BEFORE A MEAL, Disp: 180 tablet, Rfl: 1   tolterodine (DETROL LA) 4 MG 24 hr capsule, Take 1 capsule (4 mg total) by mouth daily., Disp: 90 capsule, Rfl: 3   topiramate (TOPAMAX) 25 MG tablet, TAKE 1 TABLET BY MOUTH TWICE A DAY, Disp: 180 tablet, Rfl: 1  EXAM:  GENERAL: alert, oriented, appears well and in no acute distress  HEENT: atraumatic, conjunttiva clear, no obvious abnormalities on inspection of external nose and ears  NECK: normal movements of the head and neck  LUNGS: on inspection no signs of respiratory distress, breathing rate appears normal, no obvious gross SOB, gasping or wheezing  CV: no obvious cyanosis  PSYCH/NEURO: pleasant and cooperative, no obvious depression or anxiety, speech and thought processing grossly intact  ASSESSMENT AND PLAN:  Discussed the following assessment and plan:  Asthma Breathing stable.  Followed by Dr Mortimer Fries.  Continue flovent.  Follow.   Elevated alkaline phosphatase level Has  seen GI.  Had abdominal ultrasound - gallstones. CT - diverticulosis.  No acute abnormality.  Saw Dr Bary Castilla.  Elected to follow.  Schedule f/u liver panel.   Gallstones Evaluated by Dr Bary Castilla.  Elected to monitor.   GERD (gastroesophageal reflux disease) Controlled.  Followed by GI.   Hypercholesteremia Low cholesterol diet and exercise.  Follow lipid panel.   Interstitial cystitis Followed by urology.    Abdominal pain Still with intermittent abdominal pain. Notices after eating.  No vomiting. Some nausea.  Taking stool softener.  Bowels are moving.  Had recent colonoscopy, CT and abdominal ultrasound - unrevealing.  On protonix.  Refer back to GI for further evaluation.  Pt in agreement.     I discussed the assessment and treatment plan with the patient. The patient was provided an opportunity to ask questions and all were answered. The patient agreed with  the plan and demonstrated an understanding of the instructions.   The patient was advised to call back or seek an in-person evaluation if the symptoms worsen or if the condition fails to improve as anticipated.   Dale Concordia, MD

## 2019-03-06 ENCOUNTER — Encounter: Payer: Self-pay | Admitting: Internal Medicine

## 2019-03-06 DIAGNOSIS — R109 Unspecified abdominal pain: Secondary | ICD-10-CM | POA: Insufficient documentation

## 2019-03-06 NOTE — Assessment & Plan Note (Signed)
Has seen GI.  Had abdominal ultrasound - gallstones. CT - diverticulosis.  No acute abnormality.  Saw Dr Bary Castilla.  Elected to follow.  Schedule f/u liver panel.

## 2019-03-06 NOTE — Assessment & Plan Note (Signed)
Still with intermittent abdominal pain. Notices after eating.  No vomiting. Some nausea.  Taking stool softener.  Bowels are moving.  Had recent colonoscopy, CT and abdominal ultrasound - unrevealing.  On protonix.  Refer back to GI for further evaluation.  Pt in agreement.

## 2019-03-06 NOTE — Assessment & Plan Note (Signed)
Breathing stable.  Followed by Dr Mortimer Fries.  Continue flovent.  Follow.

## 2019-03-06 NOTE — Assessment & Plan Note (Signed)
Evaluated by Dr Bary Castilla.  Elected to monitor.

## 2019-03-06 NOTE — Assessment & Plan Note (Signed)
Followed by urology.   

## 2019-03-06 NOTE — Assessment & Plan Note (Signed)
Controlled.  Followed by GI.

## 2019-03-06 NOTE — Assessment & Plan Note (Signed)
Low cholesterol diet and exercise.  Follow lipid panel.   

## 2019-03-14 ENCOUNTER — Other Ambulatory Visit (INDEPENDENT_AMBULATORY_CARE_PROVIDER_SITE_OTHER): Payer: BLUE CROSS/BLUE SHIELD

## 2019-03-14 ENCOUNTER — Encounter: Payer: Self-pay | Admitting: Internal Medicine

## 2019-03-14 ENCOUNTER — Other Ambulatory Visit: Payer: Self-pay

## 2019-03-14 DIAGNOSIS — R739 Hyperglycemia, unspecified: Secondary | ICD-10-CM | POA: Diagnosis not present

## 2019-03-14 DIAGNOSIS — R1084 Generalized abdominal pain: Secondary | ICD-10-CM | POA: Diagnosis not present

## 2019-03-14 DIAGNOSIS — E7800 Pure hypercholesterolemia, unspecified: Secondary | ICD-10-CM | POA: Diagnosis not present

## 2019-03-14 DIAGNOSIS — R748 Abnormal levels of other serum enzymes: Secondary | ICD-10-CM

## 2019-03-14 LAB — CBC WITH DIFFERENTIAL/PLATELET
Basophils Absolute: 0 10*3/uL (ref 0.0–0.1)
Basophils Relative: 0.6 % (ref 0.0–3.0)
Eosinophils Absolute: 0.5 10*3/uL (ref 0.0–0.7)
Eosinophils Relative: 11.2 % — ABNORMAL HIGH (ref 0.0–5.0)
HCT: 43.2 % (ref 36.0–46.0)
Hemoglobin: 14.3 g/dL (ref 12.0–15.0)
Lymphocytes Relative: 34.3 % (ref 12.0–46.0)
Lymphs Abs: 1.5 10*3/uL (ref 0.7–4.0)
MCHC: 33.1 g/dL (ref 30.0–36.0)
MCV: 87.2 fl (ref 78.0–100.0)
Monocytes Absolute: 0.2 10*3/uL (ref 0.1–1.0)
Monocytes Relative: 5.3 % (ref 3.0–12.0)
Neutro Abs: 2.1 10*3/uL (ref 1.4–7.7)
Neutrophils Relative %: 48.6 % (ref 43.0–77.0)
Platelets: 245 10*3/uL (ref 150.0–400.0)
RBC: 4.96 Mil/uL (ref 3.87–5.11)
RDW: 14.9 % (ref 11.5–15.5)
WBC: 4.3 10*3/uL (ref 4.0–10.5)

## 2019-03-14 LAB — HEPATIC FUNCTION PANEL
ALT: 23 U/L (ref 0–35)
AST: 19 U/L (ref 0–37)
Albumin: 4.4 g/dL (ref 3.5–5.2)
Alkaline Phosphatase: 143 U/L — ABNORMAL HIGH (ref 39–117)
Bilirubin, Direct: 0.1 mg/dL (ref 0.0–0.3)
Total Bilirubin: 0.5 mg/dL (ref 0.2–1.2)
Total Protein: 6.5 g/dL (ref 6.0–8.3)

## 2019-03-14 LAB — BASIC METABOLIC PANEL
BUN: 12 mg/dL (ref 6–23)
CO2: 25 mEq/L (ref 19–32)
Calcium: 9.4 mg/dL (ref 8.4–10.5)
Chloride: 106 mEq/L (ref 96–112)
Creatinine, Ser: 0.87 mg/dL (ref 0.40–1.20)
GFR: 65.42 mL/min (ref >60.00)
Glucose, Bld: 88 mg/dL (ref 70–99)
Potassium: 4 mEq/L (ref 3.5–5.1)
Sodium: 139 mEq/L (ref 135–145)

## 2019-03-14 LAB — HEMOGLOBIN A1C: Hgb A1c MFr Bld: 5.5 % (ref 4.6–6.5)

## 2019-03-14 LAB — LIPID PANEL
Cholesterol: 230 mg/dL — ABNORMAL HIGH (ref 0–200)
HDL: 96.6 mg/dL (ref >39.00)
LDL Cholesterol: 124 mg/dL — ABNORMAL HIGH (ref 0–99)
NonHDL: 133.54
Total CHOL/HDL Ratio: 2
Triglycerides: 48 mg/dL (ref 0.0–149.0)
VLDL: 9.6 mg/dL (ref 0.0–40.0)

## 2019-03-14 LAB — TSH: TSH: 1.86 u[IU]/mL (ref 0.35–4.50)

## 2019-04-15 ENCOUNTER — Telehealth: Payer: Self-pay | Admitting: Internal Medicine

## 2019-04-15 NOTE — Telephone Encounter (Signed)
Spoke to Dr. Jayme Cloud verbally- who recommend covid testing and plenty of fluids and albuterol every 4 hours.  Pt is aware of above message and voiced her understanding. Pt has been provided with contact number for community testing.  Nothing further is needed.

## 2019-04-15 NOTE — Telephone Encounter (Signed)
Called and spoke to pt.  Pt reports of non prod cough, mild wheezing, increased sob, headache, weakness and chills x2d. Denied sweats or fever.  Not currently taking any OTC meds to help with sx.  Pt using albuterol 2-3x daily and Flovent BID.   DK, please advise. Thanks

## 2019-04-18 ENCOUNTER — Other Ambulatory Visit: Payer: BLUE CROSS/BLUE SHIELD

## 2019-07-14 ENCOUNTER — Other Ambulatory Visit: Payer: Self-pay | Admitting: Internal Medicine

## 2019-07-14 ENCOUNTER — Ambulatory Visit: Payer: Self-pay | Attending: Internal Medicine

## 2019-07-14 DIAGNOSIS — Z23 Encounter for immunization: Secondary | ICD-10-CM

## 2019-07-14 NOTE — Progress Notes (Signed)
   Covid-19 Vaccination Clinic  Name:  Savannah Gomez    MRN: [id number] DOB: Sep 01, 1955  07/14/2019  [name] was observed post Covid-19 immunization for 15 minutes without incident. She was provided with Vaccine Information Sheet and instruction to access the V-Safe system.   [name] was instructed to call 911 with any severe reactions post vaccine: Marland Kitchen Difficulty breathing  . Swelling of face and throat  . A fast heartbeat  . A bad rash all over body  . Dizziness and weakness   Immunizations Administered    Name Date Dose VIS Date Route   Pfizer COVID-19 Vaccine 07/14/2019 10:13 AM 0.3 mL 05/18/2018 Intramuscular   Manufacturer: [employer], [employer]   Lot: [id number]   NDC: [address]-1

## 2019-07-25 ENCOUNTER — Other Ambulatory Visit: Payer: Self-pay | Admitting: Internal Medicine

## 2019-08-09 ENCOUNTER — Ambulatory Visit: Payer: Self-pay | Attending: Internal Medicine

## 2019-08-09 DIAGNOSIS — Z23 Encounter for immunization: Secondary | ICD-10-CM

## 2019-08-09 NOTE — Progress Notes (Signed)
   Covid-19 Vaccination Clinic  Name:  GAO MITNICK    MRN: [id number] DOB: 1955/12/23  08/09/2019  Ms. Plamondon was observed post Covid-19 immunization for 30 minutes based on pre-vaccination screening without incident. She was provided with Vaccine Information Sheet and instruction to access the V-Safe system.   Ms. Sangster was instructed to call 911 with any severe reactions post vaccine: Marland Kitchen Difficulty breathing  . Swelling of face and throat  . A fast heartbeat  . A bad rash all over body  . Dizziness and weakness   Immunizations Administered    Name Date Dose VIS Date Route   Pfizer COVID-19 Vaccine 08/09/2019  9:42 AM 0.3 mL 05/18/2018 Intramuscular   Manufacturer: [employer], [employer]   Lot: [id number]   NDC: [address]-7

## 2019-09-01 ENCOUNTER — Other Ambulatory Visit: Payer: Self-pay | Admitting: Internal Medicine

## 2019-09-08 ENCOUNTER — Encounter: Payer: BLUE CROSS/BLUE SHIELD | Admitting: Internal Medicine

## 2019-09-14 ENCOUNTER — Other Ambulatory Visit: Payer: Self-pay

## 2019-09-19 ENCOUNTER — Ambulatory Visit (INDEPENDENT_AMBULATORY_CARE_PROVIDER_SITE_OTHER): Payer: Medicare HMO | Admitting: Internal Medicine

## 2019-09-19 ENCOUNTER — Other Ambulatory Visit: Payer: Self-pay

## 2019-09-19 VITALS — BP 130/78 | HR 70 | Temp 97.6°F | Resp 16 | Ht 63.0 in | Wt 173.0 lb

## 2019-09-19 DIAGNOSIS — Z1231 Encounter for screening mammogram for malignant neoplasm of breast: Secondary | ICD-10-CM | POA: Diagnosis not present

## 2019-09-19 DIAGNOSIS — R079 Chest pain, unspecified: Secondary | ICD-10-CM | POA: Diagnosis not present

## 2019-09-19 DIAGNOSIS — N3010 Interstitial cystitis (chronic) without hematuria: Secondary | ICD-10-CM | POA: Diagnosis not present

## 2019-09-19 DIAGNOSIS — K8020 Calculus of gallbladder without cholecystitis without obstruction: Secondary | ICD-10-CM | POA: Diagnosis not present

## 2019-09-19 DIAGNOSIS — Z0000 Encounter for general adult medical examination without abnormal findings: Secondary | ICD-10-CM

## 2019-09-19 DIAGNOSIS — R748 Abnormal levels of other serum enzymes: Secondary | ICD-10-CM

## 2019-09-19 DIAGNOSIS — E7800 Pure hypercholesterolemia, unspecified: Secondary | ICD-10-CM

## 2019-09-19 DIAGNOSIS — K219 Gastro-esophageal reflux disease without esophagitis: Secondary | ICD-10-CM

## 2019-09-19 DIAGNOSIS — Z23 Encounter for immunization: Secondary | ICD-10-CM

## 2019-09-19 DIAGNOSIS — R739 Hyperglycemia, unspecified: Secondary | ICD-10-CM | POA: Diagnosis not present

## 2019-09-19 NOTE — Assessment & Plan Note (Signed)
Physical today 09/19/19.  Colonoscopy 08/2018.  Mammogram 10/05/18 - Birads I.  She will schedule f/u mammogram.

## 2019-09-19 NOTE — Progress Notes (Signed)
Patient ID: KYRIA BUMGARDNER, female   DOB: Dec 08, 1955, 64 y.o.   MRN: [id number]   Subjective:    Patient ID: CHITARA CLONCH, female    DOB: 01/30/1956, 64 y.o.   MRN: [id number]  HPI This visit occurred during the SARS-CoV-2 public health emergency.  Safety protocols were in place, including screening questions prior to the visit, additional usage of staff PPE, and extensive cleaning of exam room while observing appropriate contact time as indicated for disinfecting solutions.  Patient here for her physical exam. She reports she is doing relatively well.  Has noticed chest discomfort on two separate occasions.  Chest discomforts radiates to back.  Noticed one episode after she had eaten at [facility].  Described upset stomach.  Burping helped.  No significant acid reflux.  No chest pain with increased exertion.  Breathing overall stable.  No increased cough or congestion.  No abdominal pain.  Bowels moving- taking miralax qod.  Has a known history of gallstones.  Has seen Dr Lemar Livings previously.  Elected to monitor.  Was questioning if some of this discomfort/episodes related to her gallbladder.     Past Medical History:  Diagnosis Date  . Allergy   . Arthritis   . Asthma   . Chicken pox   . GERD (gastroesophageal reflux disease)   . Migraine headache   . Urine incontinence    Past Surgical History:  Procedure Laterality Date  . BREAST CYST ASPIRATION Bilateral    neg  . carpal tunnel sugery     bilateral  . COLONOSCOPY WITH PROPOFOL N/A 09/20/2018   Procedure: COLONOSCOPY WITH PROPOFOL;  Surgeon: Scot Jun, MD;  Location: [facility] ENDOSCOPY;  Service: Endoscopy;  Laterality: N/A;  . ELBOW SURGERY  1999  . ESOPHAGOGASTRODUODENOSCOPY (EGD) WITH PROPOFOL N/A 09/20/2018   Procedure: ESOPHAGOGASTRODUODENOSCOPY (EGD) WITH PROPOFOL;  Surgeon: Scot Jun, MD;  Location: [facility] ENDOSCOPY;  Service: Endoscopy;  Laterality: N/A;  . HAND SURGERY  1999   broken finger - pins  . NASAL  SINUS SURGERY  1992  . ROTATOR CUFF REPAIR  1994  . Trigger finger surgery  1999 and 2000  . VAGINAL HYSTERECTOMY  1987   secondary to fibroids   Family History  Problem Relation Age of Onset  . Colon cancer Mother        colon  . Asthma Mother   . Heart disease Father   . Hypertension Father   . Colon cancer Sister   . Stroke Maternal Grandfather   . Diabetes Maternal Grandfather   . Lung cancer Maternal Grandmother   . Breast cancer Maternal Grandmother    Social History   Socioeconomic History  . Marital status: Married    Spouse name: Not on file  . Number of children: Not on file  . Years of education: Not on file  . Highest education level: Not on file  Occupational History  . Not on file  Tobacco Use  . Smoking status: Never Smoker  . Smokeless tobacco: Never Used  Vaping Use  . Vaping Use: Never used  Substance and Sexual Activity  . Alcohol use: No    Alcohol/week: 0.0 standard drinks  . Drug use: No  . Sexual activity: Yes    Birth control/protection: None, Post-menopausal  Other Topics Concern  . Not on file  Social History Narrative  . Not on file   Social Determinants of Health   Financial Resource Strain:   . Difficulty of Paying Living Expenses:   Food Insecurity:   .  Worried About Programme researcher, broadcasting/film/video in the Last Year:   . Barista in the Last Year:   Transportation Needs:   . Freight forwarder (Medical):   Marland Kitchen Lack of Transportation (Non-Medical):   Physical Activity:   . Days of Exercise per Week:   . Minutes of Exercise per Session:   Stress:   . Feeling of Stress :   Social Connections:   . Frequency of Communication with Friends and Family:   . Frequency of Social Gatherings with Friends and Family:   . Attends Religious Services:   . Active Member of Clubs or Organizations:   . Attends Banker Meetings:   Marland Kitchen Marital Status:     Outpatient Encounter Medications as of 09/19/2019  Medication Sig  .  acetaminophen (TYLENOL) 500 MG tablet Take 500 mg by mouth as needed.  Marland Kitchen albuterol (PROVENTIL HFA;VENTOLIN HFA) 108 (90 Base) MCG/ACT inhaler Inhale 2 puffs into the lungs every 6 (six) hours as needed for wheezing or shortness of breath.  . Calcium Carb-Cholecalciferol (CALCIUM CARBONATE-VITAMIN D3 PO) Take 400 Units by mouth.  . calcium carbonate (CALCIUM 600) 600 MG TABS tablet Take 600 mg by mouth daily with breakfast.  . cetirizine (ZYRTEC) 10 MG tablet Take 10 mg by mouth daily.  Tery Sanfilippo Calcium (STOOL SOFTENER PO) Take by mouth daily.  . fluticasone (FLONASE) 50 MCG/ACT nasal spray Place 1 spray into the nose daily as needed.  . fluticasone (FLOVENT HFA) 110 MCG/ACT inhaler Inhale 2 puffs into the lungs 2 (two) times daily.  Marland Kitchen imipramine (TOFRANIL) 25 MG tablet Take 1 tablet (25 mg total) by mouth 2 (two) times daily.  . Magnesium Oxide 400 (240 Mg) MG TABS Take 1 tablet by mouth daily.  . pantoprazole (PROTONIX) 40 MG tablet TAKE 1 TABLET BY MOUTH TWICE A DAY BEFORE A MEAL  . SHINGRIX injection INJECT 0.5 ML INTO THE MUSCLE ONCE FOR 1DOSE  . tolterodine (DETROL LA) 4 MG 24 hr capsule Take 1 capsule (4 mg total) by mouth daily.  Marland Kitchen topiramate (TOPAMAX) 25 MG tablet TAKE 1 TABLET BY MOUTH TWICE A DAY   No facility-administered encounter medications on file as of 09/19/2019.    Review of Systems  Constitutional: Negative for appetite change and unexpected weight change.  HENT: Negative for congestion and sinus pressure.   Respiratory: Negative for cough, chest tightness and shortness of breath.   Cardiovascular: Positive for chest pain. Negative for palpitations and leg swelling.  Gastrointestinal: Negative for abdominal pain, diarrhea and vomiting.       Some nausea as outlined.    Genitourinary: Negative for difficulty urinating and dysuria.  Musculoskeletal: Negative for joint swelling and myalgias.  Skin: Negative for color change and rash.  Neurological: Negative for dizziness,  light-headedness and headaches.  Psychiatric/Behavioral: Negative for agitation and dysphoric mood.       Objective:    Physical Exam Constitutional:      General: She is not in acute distress.    Appearance: Normal appearance.  HENT:     Head: Normocephalic.     Right Ear: External ear normal.     Left Ear: External ear normal.  Eyes:     General: No scleral icterus.       Right eye: No discharge.        Left eye: No discharge.     Conjunctiva/sclera: Conjunctivae normal.  Neck:     Thyroid: No thyromegaly.  Cardiovascular:  Rate and Rhythm: Normal rate and regular rhythm.  Pulmonary:     Effort: No respiratory distress.     Breath sounds: Normal breath sounds. No wheezing.  Abdominal:     General: Bowel sounds are normal.     Palpations: Abdomen is soft.     Tenderness: There is no abdominal tenderness.  Musculoskeletal:        General: No swelling or tenderness.     Cervical back: Neck supple. No tenderness.  Lymphadenopathy:     Cervical: No cervical adenopathy.  Skin:    Findings: No erythema or rash.  Neurological:     Mental Status: She is alert.  Psychiatric:        Mood and Affect: Mood normal.        Behavior: Behavior normal.     BP 130/78   Pulse 70   Temp 97.6 F (36.4 C)   Resp 16   Ht 5\' 3"  (1.6 m)   Wt 173 lb (78.5 kg)   SpO2 99%   BMI 30.65 kg/m  Wt Readings from Last 3 Encounters:  09/19/19 173 lb (78.5 kg)  03/04/19 174 lb (78.9 kg)  10/26/18 175 lb (79.4 kg)     Lab Results  Component Value Date   WBC 4.3 03/14/2019   HGB 14.3 03/14/2019   HCT 43.2 03/14/2019   PLT 245.0 03/14/2019   GLUCOSE 88 03/14/2019   CHOL 230 (H) 03/14/2019   TRIG 48.0 03/14/2019   HDL 96.60 03/14/2019   LDLCALC 124 (H) 03/14/2019   ALT 23 03/14/2019   AST 19 03/14/2019   NA 139 03/14/2019   K 4.0 03/14/2019   CL 106 03/14/2019   CREATININE 0.87 03/14/2019   BUN 12 03/14/2019   CO2 25 03/14/2019   TSH 1.86 03/14/2019   HGBA1C 5.5  03/14/2019    MM 3D SCREEN BREAST BILATERAL  Result Date: 10/05/2018 CLINICAL DATA:  Screening. EXAM: DIGITAL SCREENING BILATERAL MAMMOGRAM WITH TOMO AND CAD COMPARISON:  Previous exam(s). ACR Breast Density Category c: The breast tissue is heterogeneously dense, which may obscure small masses. FINDINGS: There are no findings suspicious for malignancy. Images were processed with CAD. IMPRESSION: No mammographic evidence of malignancy. A result letter of this screening mammogram will be mailed directly to the patient. RECOMMENDATION: Screening mammogram in one year. (Code:SM-B-01Y) BI-RADS CATEGORY  1: Negative. Electronically Signed   By: 10/07/2018 M.D.   On: 10/05/2018 12:38       Assessment & Plan:   Problem List Items Addressed This Visit    Chest pain    Has had episodes of chest pain as outlined.  Radiates to back.  Has risk factors - family history of CAD. Discussed possible etiologies of her pain.  EKG - SR with no acute ischemic changes.  Has seen Dr 10/07/2018.  Will refer back to cardiology to determine if any further cardiac w/up warranted.  Discussed calcium scoring, given concern regarding risk factors, etc.  Also has known gallstones.  Recent episode occurred after eating a "[place] onion" at [facility].  Given episodes, will have Dr [facility] reevaluate with question of need for any further intervention.  Pt comfortable with this plan.  Any change or worsening symptoms, needs to be evaluated.        Relevant Orders   EKG 12-Lead   Ambulatory referral to Cardiology   Ambulatory referral to General Surgery   Elevated alkaline phosphatase level    Level has been stable.  Recheck liver panel to  confirm stable.  Ultrasound as outlined previously.       Relevant Orders   Hepatic function panel   Gallstones    Known gallstones.  With recent episodes as outlined, will have Dr Lemar LivingsByrnett reevaluate.        Relevant Orders   Ambulatory referral to General Surgery   GERD (gastroesophageal  reflux disease)    No acid reflux reported.  Controlled.  Has been followed by GI.       Health care maintenance    Physical today 09/19/19.  Colonoscopy 08/2018.  Mammogram 10/05/18 - Birads I.  She will schedule f/u mammogram.        Hypercholesteremia    Low cholesterol diet and exercise.  Follow lipid panel.       Relevant Orders   CBC with Differential/Platelet   Lipid panel   Basic metabolic panel   Interstitial cystitis    Has been evaluated and followed by urology.         Other Visit Diagnoses    Hyperglycemia    -  Primary   Relevant Orders   Hemoglobin A1c   Visit for screening mammogram       Relevant Orders   MM 3D SCREEN BREAST BILATERAL   Need for vaccination with 13-polyvalent pneumococcal conjugate vaccine       Relevant Orders   Pneumococcal conjugate vaccine 13-valent (Completed)       Dale Durhamharlene Cyrilla Durkin, MD

## 2019-09-26 ENCOUNTER — Encounter: Payer: Self-pay | Admitting: Internal Medicine

## 2019-09-26 NOTE — Assessment & Plan Note (Signed)
No acid reflux reported.  Controlled.  Has been followed by GI.

## 2019-09-26 NOTE — Assessment & Plan Note (Signed)
Has had episodes of chest pain as outlined.  Radiates to back.  Has risk factors - family history of CAD. Discussed possible etiologies of her pain.  EKG - SR with no acute ischemic changes.  Has seen Dr Lady Gary.  Will refer back to cardiology to determine if any further cardiac w/up warranted.  Discussed calcium scoring, given concern regarding risk factors, etc.  Also has known gallstones.  Recent episode occurred after eating a "[place] onion" at [facility].  Given episodes, will have Dr Lemar Livings reevaluate with question of need for any further intervention.  Pt comfortable with this plan.  Any change or worsening symptoms, needs to be evaluated.

## 2019-09-26 NOTE — Assessment & Plan Note (Signed)
Has been evaluated and followed by urology. 

## 2019-09-26 NOTE — Assessment & Plan Note (Signed)
Level has been stable.  Recheck liver panel to confirm stable.  Ultrasound as outlined previously.

## 2019-09-26 NOTE — Assessment & Plan Note (Signed)
Low cholesterol diet and exercise.  Follow lipid panel.   

## 2019-09-26 NOTE — Assessment & Plan Note (Signed)
Known gallstones.  With recent episodes as outlined, will have Dr Lemar Livings reevaluate.

## 2019-10-03 ENCOUNTER — Other Ambulatory Visit: Payer: Self-pay

## 2019-10-03 ENCOUNTER — Other Ambulatory Visit (INDEPENDENT_AMBULATORY_CARE_PROVIDER_SITE_OTHER): Payer: Medicare HMO

## 2019-10-03 DIAGNOSIS — R748 Abnormal levels of other serum enzymes: Secondary | ICD-10-CM

## 2019-10-03 DIAGNOSIS — E7800 Pure hypercholesterolemia, unspecified: Secondary | ICD-10-CM

## 2019-10-03 DIAGNOSIS — R739 Hyperglycemia, unspecified: Secondary | ICD-10-CM | POA: Diagnosis not present

## 2019-10-03 LAB — CBC WITH DIFFERENTIAL/PLATELET
Basophils Absolute: 0 10*3/uL (ref 0.0–0.1)
Basophils Relative: 0.5 % (ref 0.0–3.0)
Eosinophils Absolute: 0.4 10*3/uL (ref 0.0–0.7)
Eosinophils Relative: 10.1 % — ABNORMAL HIGH (ref 0.0–5.0)
HCT: 40.6 % (ref 36.0–46.0)
Hemoglobin: 13.6 g/dL (ref 12.0–15.0)
Lymphocytes Relative: 31.7 % (ref 12.0–46.0)
Lymphs Abs: 1.3 10*3/uL (ref 0.7–4.0)
MCHC: 33.5 g/dL (ref 30.0–36.0)
MCV: 87.3 fl (ref 78.0–100.0)
Monocytes Absolute: 0.3 10*3/uL (ref 0.1–1.0)
Monocytes Relative: 6.8 % (ref 3.0–12.0)
Neutro Abs: 2.1 10*3/uL (ref 1.4–7.7)
Neutrophils Relative %: 50.9 % (ref 43.0–77.0)
Platelets: 211 10*3/uL (ref 150.0–400.0)
RBC: 4.65 Mil/uL (ref 3.87–5.11)
RDW: 14.3 % (ref 11.5–15.5)
WBC: 4.1 10*3/uL (ref 4.0–10.5)

## 2019-10-03 LAB — BASIC METABOLIC PANEL
BUN: 14 mg/dL (ref 6–23)
CO2: 27 mEq/L (ref 19–32)
Calcium: 9.6 mg/dL (ref 8.4–10.5)
Chloride: 105 mEq/L (ref 96–112)
Creatinine, Ser: 0.94 mg/dL (ref 0.40–1.20)
GFR: 59.72 mL/min — ABNORMAL LOW (ref >60.00)
Glucose, Bld: 96 mg/dL (ref 70–99)
Potassium: 4.2 mEq/L (ref 3.5–5.1)
Sodium: 140 mEq/L (ref 135–145)

## 2019-10-03 LAB — LIPID PANEL
Cholesterol: 219 mg/dL — ABNORMAL HIGH (ref 0–200)
HDL: 93 mg/dL (ref >39.00)
LDL Cholesterol: 114 mg/dL — ABNORMAL HIGH (ref 0–99)
NonHDL: 125.65
Total CHOL/HDL Ratio: 2
Triglycerides: 60 mg/dL (ref 0.0–149.0)
VLDL: 12 mg/dL (ref 0.0–40.0)

## 2019-10-03 LAB — HEPATIC FUNCTION PANEL
ALT: 22 U/L (ref 0–35)
AST: 19 U/L (ref 0–37)
Albumin: 4.5 g/dL (ref 3.5–5.2)
Alkaline Phosphatase: 122 U/L — ABNORMAL HIGH (ref 39–117)
Bilirubin, Direct: 0.1 mg/dL (ref 0.0–0.3)
Total Bilirubin: 0.5 mg/dL (ref 0.2–1.2)
Total Protein: 6.4 g/dL (ref 6.0–8.3)

## 2019-10-03 LAB — HEMOGLOBIN A1C: Hgb A1c MFr Bld: 5.4 % (ref 4.6–6.5)

## 2019-10-04 ENCOUNTER — Other Ambulatory Visit: Payer: Self-pay | Admitting: Internal Medicine

## 2019-10-04 DIAGNOSIS — R748 Abnormal levels of other serum enzymes: Secondary | ICD-10-CM

## 2019-10-04 NOTE — Progress Notes (Signed)
Order placed for f/u labs.  

## 2019-10-13 ENCOUNTER — Ambulatory Visit
Admission: EM | Admit: 2019-10-13 | Discharge: 2019-10-13 | Disposition: A | Discharge status: Home / Self Care | Payer: Medicare HMO | Attending: Emergency Medicine | Admitting: Emergency Medicine

## 2019-10-13 DIAGNOSIS — Z0189 Encounter for other specified special examinations: Secondary | ICD-10-CM | POA: Diagnosis not present

## 2019-10-13 DIAGNOSIS — B349 Viral infection, unspecified: Secondary | ICD-10-CM | POA: Diagnosis not present

## 2019-10-13 NOTE — ED Provider Notes (Signed)
[facility]    CSN: [id number] Arrival date & time: 10/13/19  1602      History   Chief Complaint Chief Complaint  Patient presents with  . Fatigue    HPI Savannah NeighborsCynthia H Duprey is a 64 y.o. female.   Patient presents with low grade fever, chills, fatigue, headache, nausea, sore throat x4 days.  Her symptoms improved with Tylenol.  She denies cough, shortness of breath, vomiting, rash, or other symptoms.  She states her symptoms feel similar to when she was COVID positive in January.  She received both Pfizer vaccines.  The history is provided by the patient.    Past Medical History:  Diagnosis Date  . Allergy   . Arthritis   . Asthma   . Chicken pox   . GERD (gastroesophageal reflux disease)   . Migraine headache   . Urine incontinence     Patient Active Problem List   Diagnosis Date Noted  . Chest pain 09/19/2019  . Abdominal pain 03/06/2019  . Gallstones 07/27/2018  . Elevated alkaline phosphatase level 02/07/2018  . Colon polyp 11/16/2017  . Diverticulitis 11/16/2017  . Irregular heart rhythm 11/16/2017  . Finger pain, left 08/27/2017  . Headache 04/02/2017  . Sinusitis 02/22/2017  . Rotator cuff tendinitis, left 10/27/2016  . Status post left rotator cuff repair 10/27/2016  . Stenosing tenosynovitis of finger 10/27/2016  . Muscle spasm 12/05/2015  . Asthma 10/17/2015  . Dyspnea 10/17/2015  . Cough 03/06/2015  . Family history of colon cancer 11/07/2014  . Health care maintenance 05/07/2014  . Nephrolithiasis 02/28/2014  . Fatigue 02/23/2013  . Incomplete emptying of bladder 02/11/2012  . Increased frequency of urination 02/11/2012  . Symptoms involving urinary system 02/11/2012  . Urge incontinence 02/11/2012  . History of migraine headaches 01/18/2012  . Interstitial cystitis 01/18/2012  . Hypercholesteremia 01/18/2012  . GERD (gastroesophageal reflux disease) 01/18/2012    Past Surgical History:  Procedure Laterality Date  . BREAST CYST  ASPIRATION Bilateral    neg  . carpal tunnel sugery     bilateral  . COLONOSCOPY WITH PROPOFOL N/A 09/20/2018   Procedure: COLONOSCOPY WITH PROPOFOL;  Surgeon: Scot JunElliott, Robert T, MD;  [facility];  Service: Endoscopy;  Laterality: N/A;  . ELBOW SURGERY  1999  . ESOPHAGOGASTRODUODENOSCOPY (EGD) WITH PROPOFOL N/A 09/20/2018   Procedure: ESOPHAGOGASTRODUODENOSCOPY (EGD) WITH PROPOFOL;  Surgeon: Scot JunElliott, Robert T, MD;  [facility];  Service: Endoscopy;  Laterality: N/A;  . HAND SURGERY  1999   broken finger - pins  . NASAL SINUS SURGERY  1992  . ROTATOR CUFF REPAIR  1994  . Trigger finger surgery  1999 and 2000  . VAGINAL HYSTERECTOMY  1987   secondary to fibroids    OB History   No obstetric history on file.      Home Medications    Prior to Admission medications   Medication Sig Start Date End Date Taking? Authorizing Provider  acetaminophen (TYLENOL) 500 MG tablet Take 500 mg by mouth as needed.    [provider]  albuterol (PROVENTIL HFA;VENTOLIN HFA) 108 (90 Base) MCG/ACT inhaler Inhale 2 puffs into the lungs every 6 (six) hours as needed for wheezing or shortness of breath. 06/01/18   Erin FullingKasa, Kurian, MD  Calcium Carb-Cholecalciferol (CALCIUM CARBONATE-VITAMIN D3 PO) Take 400 Units by mouth.    [provider]  calcium carbonate (CALCIUM 600) 600 MG TABS tablet Take 600 mg by mouth daily with breakfast.    [provider]  cetirizine (ZYRTEC) 10 MG tablet Take 10 mg by mouth daily.    [provider]  Docusate Calcium (STOOL SOFTENER PO) Take by mouth daily.    [provider]  fluticasone (FLONASE) 50 MCG/ACT nasal spray Place 1 spray into the nose daily as needed. 01/30/15   [provider]  fluticasone (FLOVENT HFA) 110 MCG/ACT inhaler Inhale 2 puffs into the lungs 2 (two) times daily. 11/18/18 11/18/19  Erin Fulling, MD  imipramine (TOFRANIL) 25 MG tablet Take 1 tablet (25 mg total) by mouth 2 (two)  times daily. 10/26/18   Stoioff, Verna Czech, MD  Magnesium Oxide 400 (240 Mg) MG TABS Take 1 tablet by mouth daily.    [provider]  pantoprazole (PROTONIX) 40 MG tablet TAKE 1 TABLET BY MOUTH TWICE A DAY BEFORE A MEAL 07/25/19   Dale Bokch[place], MD  [facility] injection INJECT 0.5 ML INTO THE MUSCLE ONCE FOR 1DOSE 07/15/19   Dale Myrtle, MD  tolterodine (DETROL LA) 4 MG 24 hr capsule Take 1 capsule (4 mg total) by mouth daily. 09/02/18   Dale [place], MD  topiramate (TOPAMAX) 25 MG tablet TAKE 1 TABLET BY MOUTH TWICE A DAY 09/01/19   Dale , MD    Family History Family History  Problem Relation Age of Onset  . Colon cancer Mother        colon  . Asthma Mother   . Heart disease Father   . Hypertension Father   . Colon cancer Sister   . Stroke Maternal Grandfather   . Diabetes Maternal Grandfather   . Lung cancer Maternal Grandmother   . Breast cancer Maternal Grandmother     Social History Social History   Tobacco Use  . Smoking status: Never Smoker  . Smokeless tobacco: Never Used  Vaping Use  . Vaping Use: Never used  Substance Use Topics  . Alcohol use: No    Alcohol/week: 0.0 standard drinks  . Drug use: No     Allergies   Demerol [meperidine]   Review of Systems Review of Systems  Constitutional: Positive for chills, fatigue and fever.  HENT: Positive for sore throat. Negative for ear pain.   Eyes: Negative for pain and visual disturbance.  Respiratory: Negative for cough and shortness of breath.   Cardiovascular: Negative for chest pain and palpitations.  Gastrointestinal: Positive for nausea. Negative for abdominal pain and vomiting.  Genitourinary: Negative for dysuria and hematuria.  Musculoskeletal: Negative for arthralgias and back pain.  Skin: Negative for color change and rash.  Neurological: Positive for headaches. Negative for seizures, syncope, weakness and numbness.  All other systems reviewed and are negative.    Physical  Exam Triage Vital Signs ED Triage Vitals  Enc Vitals Group     BP 10/13/19 1611 117/81     Pulse Rate 10/13/19 1611 77     Resp 10/13/19 1611 18     Temp 10/13/19 1611 97.6 F (36.4 C)     Temp Source 10/13/19 1611 Oral     SpO2 --      Weight 10/13/19 1614 173 lb (78.5 kg)     Height 10/13/19 1614 5\' 3"  (1.6 m)     Head Circumference --      Peak Flow --      Pain Score 10/13/19 1614 8     Pain Loc --      Pain Edu? --      Excl. in GC? --    No data found.  Updated Vital  Signs BP 117/81   Pulse 77   Temp 97.6 F (36.4 C) (Oral)   Resp 18   Ht 5\' 3"  (1.6 m)   Wt 173 lb (78.5 kg)   BMI 30.65 kg/m   Visual Acuity Right Eye Distance:   Left Eye Distance:   Bilateral Distance:    Right Eye Near:   Left Eye Near:    Bilateral Near:     Physical Exam Vitals and nursing note reviewed.  Constitutional:      General: She is not in acute distress.    Appearance: She is well-developed.  HENT:     Head: Normocephalic and atraumatic.     Right Ear: Tympanic membrane normal.     Left Ear: Tympanic membrane normal.     Nose: Nose normal.     Mouth/Throat:     Mouth: Mucous membranes are moist.     Pharynx: Oropharynx is clear.  Eyes:     Conjunctiva/sclera: Conjunctivae normal.  Cardiovascular:     Rate and Rhythm: Normal rate and regular rhythm.     Heart sounds: No murmur heard.   Pulmonary:     Effort: Pulmonary effort is normal. No respiratory distress.     Breath sounds: Normal breath sounds. No wheezing or rhonchi.  Abdominal:     Palpations: Abdomen is soft.     Tenderness: There is no abdominal tenderness. There is no guarding or rebound.  Musculoskeletal:     Cervical back: Neck supple.  Skin:    General: Skin is warm and dry.     Findings: No rash.  Neurological:     General: No focal deficit present.     Mental Status: She is alert and oriented to person, place, and time.     Gait: Gait normal.  Psychiatric:        Mood and Affect: Mood  normal.        Behavior: Behavior normal.      UC Treatments / Results  Labs (all labs ordered are listed, but only abnormal results are displayed) Labs Reviewed  NOVEL CORONAVIRUS, NAA    EKG   Radiology No results found.  Procedures Procedures (including critical care time)  Medications Ordered in UC Medications - No data to display  Initial Impression / Assessment and Plan / UC Course  I have reviewed the triage vital signs and the nursing notes.  Pertinent labs & imaging results that were available during my care of the patient were reviewed by me and considered in my medical decision making (see chart for details).   Viral illness.  PCR Covid pending.  Instructed patient to self quarantine until her test result is back.  Symptomatic treatment with Tylenol, rest, hydration.  Instructed patient to go to the ED if she has acute worsening symptoms.  Patient agrees to plan of care.     Final Clinical Impressions(s) / UC Diagnoses   Final diagnoses:  Viral illness     Discharge Instructions     Your COVID test is pending.  You should self quarantine until the test result is back.    Take Tylenol as needed for fever or discomfort.  Rest and keep yourself hydrated.    Go to the emergency department if you develop shortness of breath, severe diarrhea, high fever not relieved by Tylenol or ibuprofen, or other concerning symptoms.       ED Prescriptions    None     PDMP not reviewed this encounter.   ,  Fredrich Romans, NP 10/13/19 1643

## 2019-10-13 NOTE — ED Triage Notes (Signed)
Pt reports feeling fatigue, having a ha, sore throat, chills and upset stomach. Pt reports having covid in Jan and she feels the same now as she did  In Jan. Reports having a low grade fever at home.

## 2019-10-13 NOTE — Discharge Instructions (Addendum)
Your COVID test is pending.  You should self quarantine until the test result is back.    Take Tylenol as needed for fever or discomfort.  Rest and keep yourself hydrated.    Go to the emergency department if you develop shortness of breath, severe diarrhea, high fever not relieved by Tylenol or ibuprofen, or other concerning symptoms.    

## 2019-10-14 LAB — NOVEL CORONAVIRUS, NAA: SARS-CoV-2, NAA: NOT DETECTED

## 2019-10-14 LAB — SARS-COV-2, NAA 2 DAY TAT

## 2019-10-18 DIAGNOSIS — K8020 Calculus of gallbladder without cholecystitis without obstruction: Secondary | ICD-10-CM | POA: Diagnosis not present

## 2019-10-19 ENCOUNTER — Other Ambulatory Visit: Payer: Self-pay | Admitting: General Surgery

## 2019-10-19 NOTE — Progress Notes (Signed)
Patient ID: Savannah Gomez is a 64 y.o. female.  HPI  The following portions of the patient's history were reviewed and updated as appropriate.  This a new patient is here today for: office visit. Here for re-evaluation of gallstones referred by Dr Lorin Picket. She states she has chest pressure radiating through to her back for last year. She admits to nausea but no vomiting. BM every 2-3 days with the use of stool softeners and Miralax.  The patient was seen in May 2020 in regards to incidentally identified gallstones.  This was done as part of an evaluation for an elevated alkaline phosphatase.  CT scan was completed at the same time that showed diverticulosis.  Colonoscopy in 2015 showed evidence of diverticulosis.  Biopsy showed no adenomatous tissue.  Repeat exam in June 2020 show a hyperplastic polyp.  The patient has atypical chest pain at this time to sternal, sharp with radiation into the back.  This usually lasts about 30 minutes.  She may obtain some relief with the use of Tums or Mylanta.  She denies belching prior to the relief of her pain.  She had undergone upper endoscopy in June 2020 with notable findings of mild gastritis.  Small hiatal hernia.  No evidence of reflux esophagitis.  The patient reports no dysphagia.  She admits to chills but COVID test last week was negative.    Review of Systems  Constitutional: Positive for chills. Negative for fatigue.  Respiratory: Negative for cough and shortness of breath.   Gastrointestinal: Positive for vomiting. Negative for nausea.         Chief Complaint  Patient presents with  . New Patient    evaluate gallstones     BP 108/62   Pulse 80   Temp 36.5 C (97.7 F)   Ht 160 cm (5\' 3" )   Wt 79.4 kg (175 lb)   SpO2 98%   BMI 31.00 kg/m       Past Medical History:  Diagnosis Date  . Allergic state    recurrent allergy sinus  . Barrett's esophagus with esophagitis   . Colon polyp   .  Diverticulitis   . Diverticulosis   . H/O mammogram 03/02/2008  . H/O mammogram 03/07/2009  . H/O mammogram 03/19/2011  . History of bone density study 01/28/2006  . History of bone density study 01/03/2009  . History of bone density study 02/06/2011  . History of migraine headaches   . Irregular heart rhythm   . Rosacea           Past Surgical History:  Procedure Laterality Date  . ARTHROSCOPIC ROTATOR CUFF REPAIR  08/2000  . CARPAL TUNNEL SURGERY Bilateral 1999  . COLONOSCOPY  12/26/1996   [facility] (Mother/Sister)  . COLONOSCOPY  05/20/1999   [facility] (Mother/Sister)  . COLONOSCOPY  10/18/2004   Serrated Adenoma, [facility] (Mother/Sister)  . COLONOSCOPY  11/04/2004   20mm Sessile Polyp: Repeat in 5 months per RTE (dw)  . COLONOSCOPY  04/14/2005   Adenomatous Polyp, [facility] (Mother/Sister)  . COLONOSCOPY  04/06/2006   PH Adenomatous Polyps, [facility] (Mother/Sister)  . COLONOSCOPY  10/01/2007   PH Adenomatous Polyps, [facility] (Mother/Sister)  . COLONOSCOPY  02/08/2010   PH Adenomatous Polyps, [facility] (Mother/Sister)  . COLONOSCOPY  12/13/2013   PH Adenomatous Polyps, [facility] (Mother/Sister): [facility] 11/2018  . COLONOSCOPY  09/20/2018   PH Adenomatous Polyps; [facility] (Mother/Sister) [facility] 08/2023  . DILATION AND CURETTAGE, DIAGNOSTIC / THERAPEUTIC  1978, 1987  . EGD  07/05/2001   No  Barrett's Esophagus  . EGD  10/18/2004   No Barrett's Esophagus  . EGD  10/01/2007   No Barrett's Esophagus  . EGD  02/08/2010   No repeat per RTE (dw)  . EGD  09/20/2018   Gastritis: No repeat   . FRACTURE SURGERY  1994   BROKEN FINGER WITH PINS BEING PLACED  . FUNCTIONAL ENDOSCOPIC SINUS SURGERY  1992  . HYSTERECTOMY  1987   vaginal, secondary to fibroids  . OOPHORECTOMY Bilateral 2001  . RIGHT ELBOW SURGERY  1999  . ROTATOR CUFF SURGERY  1994  . SIGMOIDOSCOPY FLEXIBLE  12/21/1995   No abnormalities per RTE  . TRIGGER FINGER SURGERY  1999 AND 12/1998             OB  History    Gravida  3   Para  2   Term      Preterm      AB      Living        SAB      TAB      Ectopic      Molar      Multiple      Live Births          Obstetric Comments  Age at first period 73 Age of first pregnancy 89         Social History          Socioeconomic History  . Marital status: Married    Spouse name: Not on file  . Number of children: Not on file  . Years of education: Not on file  . Highest education level: Not on file  Occupational History  . Not on file  Tobacco Use  . Smoking status: Never Smoker  . Smokeless tobacco: Never Used  Substance and Sexual Activity  . Alcohol use: No    Alcohol/week: 0.0 standard drinks  . Drug use: No  . Sexual activity: Defer  Other Topics Concern  . Not on file  Social History Narrative  . Not on file   Social Determinants of Health      Financial Resource Strain:   . Difficulty of Paying Living Expenses:   Food Insecurity:   . Worried About Programme researcher, broadcasting/film/video in the Last Year:   . Barista in the Last Year:   Transportation Needs:   . Freight forwarder (Medical):   Marland Kitchen Lack of Transportation (Non-Medical):            Allergies  Allergen Reactions  . Demerol [Meperidine] Nausea    Current Medications        Current Outpatient Medications  Medication Sig Dispense Refill  . acetaminophen (TYLENOL) 500 MG tablet Take 1,000 mg by mouth as needed for Pain.    Marland Kitchen albuterol 90 mcg/actuation inhaler Inhale 2 inhalations into the lungs every 4 (four) hours as needed       . calcium carbonate-vitamin D3 (CALCIUM 600 + D,3,) 600 mg(1,500mg ) -400 unit tablet Take 1 tablet by mouth 2 (two) times daily with meals.    . cetirizine (ZYRTEC) 10 MG tablet Take 10 mg by mouth once daily.    Marland Kitchen docusate sodium (STOOL SOFTENER ORAL) Take by mouth    . fluticasone propionate (FLOVENT HFA) 110 mcg/actuation inhaler Inhale 1 inhalation into the lungs once daily        . IMIPRAMINE HCL ORAL 25 mg. Take 2 tablets by mouth at bedtime.    . pantoprazole (PROTONIX) 40  MG DR tablet pantoprazole 40 mg tablet,delayed release    . tolterodine (DETROL LA) 4 MG LA capsule tolterodine ER 4 mg capsule,extended release 24 hr    . topiramate (TOPAMAX) 50 MG tablet Take 1 tablet (50 mg total) by mouth 2 (two) times daily. 60 tablet 8   No current facility-administered medications for this visit.      Family History  Problem Relation Age of Onset  . Colon cancer Mother   . Thyroid disease Father   . Colon cancer Sister   . Breast cancer Maternal Grandmother   . Diabetes Maternal Grandfather   . Prostate cancer Maternal Grandfather   . Lung cancer Paternal Grandmother   . Bone cancer Paternal Grandmother   . Diabetes Other     Labs and Radiology:   Abdominal ultrasound of March 2020 and follow-up CT of the abdomen and pelvis of May 2020 were again reviewed.  The ultrasound shows multiple gallstones.  CT is negative for upper abdominal pathologic process.  Diverticulosis noted.  CBC dated October 03, 2019 showed a spiral white blood cell count of 4100, 51% polys, 32% lymphocytes and 10% eosinophils.  This fairly pronounced eosinophilia goes back at least 1 year.  Hemoglobin 13.6, MCV 87, stable.  Platelet count 211,000.  Hepatic function panel at the same date was notable for alkaline phosphatase of 122 (39-117).  This is the lowest value in 1 year.  Normal transaminases and total protein.  Bilirubin 0.5.     Objective:   Physical Exam Constitutional:      Appearance: Normal appearance.  Cardiovascular:     Rate and Rhythm: Normal rate. Rhythm irregular.     Pulses: Normal pulses.     Heart sounds: Normal heart sounds.     Comments: Frequent premature beats.  ECG completed September 19, 2019 showed a sinus rhythm with frequent PACs. Pulmonary:     Effort: Pulmonary effort is normal.     Breath sounds: Normal breath sounds.   Abdominal:     General: Bowel sounds are normal.     Palpations: Abdomen is soft.  Musculoskeletal:     Cervical back: Neck supple.  Skin:    General: Skin is warm and dry.  Neurological:     Mental Status: She is alert and oriented to person, place, and time.  Psychiatric:        Mood and Affect: Mood normal.        Behavior: Behavior normal.        Assessment:     Atypical chest pain without clear cardiac or esophageal source.  Known cholelithiasis.    Plan:     The option to pursue elective cholecystectomy was reviewed in detail.  Risks of the procedure including those related to biliary tract injury were discussed.  No guarantee was provided that cholecystectomy would relieve/ameliorate her presently reported retrosternal discomfort.  After considering the pros and cons, she desires to proceed.    Entered by Dorathy Daft, RN, acting as a scribe for Dr. Donnalee Curry, MD.  The documentation recorded by the scribe accurately reflects the service I personally performed and the decisions made by me.   Earline Mayotte, MD FACS

## 2019-10-26 ENCOUNTER — Encounter
Admission: RE | Admit: 2019-10-26 | Discharge: 2019-10-26 | Disposition: A | Discharge status: Home / Self Care | Payer: Medicare HMO | Source: Ambulatory Visit | Attending: General Surgery | Admitting: General Surgery

## 2019-10-26 ENCOUNTER — Other Ambulatory Visit: Payer: Self-pay

## 2019-10-26 HISTORY — DX: Personal history of urinary calculi: Z87.442

## 2019-10-26 HISTORY — DX: Nausea with vomiting, unspecified: R11.2

## 2019-10-26 HISTORY — DX: Other complications of anesthesia, initial encounter: T88.59XA

## 2019-10-26 HISTORY — DX: Other specified postprocedural states: Z98.890

## 2019-10-26 NOTE — Patient Instructions (Signed)
Your procedure is scheduled on: Friday November 04, 2019. Report to Day Surgery inside [facility] 2nd floor. To find out your arrival time please call [phone] between 1PM - 3PM on Thursday November 03, 2019.  Remember: Instructions that are not followed completely may result in serious medical risk,  up to and including death, or upon the discretion of your surgeon and anesthesiologist your  surgery may need to be rescheduled.     _X__ 1. Do not eat food after midnight the night before your procedure.                 No gum chewing or hard candies. You may drink clear liquids up to 2 hours                 before you are scheduled to arrive for your surgery- DO not drink clear                 liquids within 2 hours of the start of your surgery.                 Clear Liquids include:  water, apple juice without pulp, clear Gatorade, G2 or                  Gatorade Zero (avoid Red/Purple/Blue), Black Coffee or Tea (Do not add                 anything to coffee or tea).  __X__2.  On the morning of surgery brush your teeth with toothpaste and water, you                may rinse your mouth with mouthwash if you wish.  Do not swallow any toothpaste of mouthwash.     _X__ 3.  No Alcohol for 24 hours before or after surgery.   _X__ 4.  Do Not Smoke or use e-cigarettes For 24 Hours Prior to Your Surgery.                 Do not use any chewable tobacco products for at least 6 hours prior to                 Surgery.  _X__  5.  Do not use any recreational drugs (marijuana, cocaine, heroin, ecstacy, MDMA or other)                For at least one week prior to your surgery.  Combination of these drugs with anesthesia                May have life threatening results.  __X__ 6.  Notify your doctor if there is any change in your medical condition      (cold, fever, infections).     Do not wear jewelry, make-up, hairpins, clips or nail polish. Do not wear lotions, powders,  or perfumes. You may wear deodorant. Do not shave 48 hours prior to surgery. Men may shave face and neck. Do not bring valuables to the hospital.    [facility] is not responsible for any belongings or valuables.  Contacts, dentures or bridgework may not be worn into surgery. Leave your suitcase in the car. After surgery it may be brought to your room. For patients admitted to the hospital, discharge time is determined by your treatment team.   Patients discharged the day of surgery will not be allowed to drive home.   Make arrangements for someone to be  with you for the first 24 hours of your Same Day Discharge.   __X__ Take these medicines the morning of surgery with A SIP OF WATER:    1.pantoprazole (PROTONIX) 40 MG   2.topiramate (TOPAMAX) 25 MG       __X__ Use CHG Soap as directed  __X__ Use inhalers on the day of surgery  albuterol (PROVENTIL HFA;VENTOLIN HFA) 108 (90 Base) MCG/ACT inhaler   fluticasone (FLOVENT HFA) 110 MCG/ACT inhaler  __X__ Stop Anti-inflammatories such as Ibuprofen, Aleve, Advil, naproxen, aspirin and or BC powders.    __X__ Stop supplements until after surgery.    __X__ Do not start any herbal supplements before your surgery.

## 2019-11-01 ENCOUNTER — Ambulatory Visit: Payer: BLUE CROSS/BLUE SHIELD | Admitting: Urology

## 2019-11-02 ENCOUNTER — Other Ambulatory Visit
Admission: RE | Admit: 2019-11-02 | Discharge: 2019-11-02 | Disposition: A | Discharge status: Home / Self Care | Payer: Medicare HMO | Source: Ambulatory Visit | Attending: General Surgery | Admitting: General Surgery

## 2019-11-02 ENCOUNTER — Ambulatory Visit: Payer: Self-pay | Admitting: Urology

## 2019-11-02 ENCOUNTER — Other Ambulatory Visit: Payer: Self-pay

## 2019-11-02 DIAGNOSIS — Z20822 Contact with and (suspected) exposure to covid-19: Secondary | ICD-10-CM | POA: Insufficient documentation

## 2019-11-02 DIAGNOSIS — Z01812 Encounter for preprocedural laboratory examination: Secondary | ICD-10-CM | POA: Diagnosis not present

## 2019-11-02 LAB — SARS CORONAVIRUS 2 (TAT 6-24 HRS): SARS Coronavirus 2: NEGATIVE

## 2019-11-04 ENCOUNTER — Ambulatory Visit: Payer: Medicare HMO | Admitting: Anesthesiology

## 2019-11-04 ENCOUNTER — Ambulatory Visit
Admission: RE | Admit: 2019-11-04 | Discharge: 2019-11-04 | Disposition: A | Discharge status: Home / Self Care | Payer: Medicare HMO | Attending: General Surgery | Admitting: General Surgery

## 2019-11-04 ENCOUNTER — Ambulatory Visit: Payer: Medicare HMO

## 2019-11-04 ENCOUNTER — Encounter
Admission: RE | Disposition: A | Discharge status: Home / Self Care | Payer: Self-pay | Source: Home / Self Care | Attending: General Surgery

## 2019-11-04 ENCOUNTER — Other Ambulatory Visit: Payer: Self-pay

## 2019-11-04 ENCOUNTER — Encounter: Payer: Self-pay | Admitting: General Surgery

## 2019-11-04 DIAGNOSIS — Z7951 Long term (current) use of inhaled steroids: Secondary | ICD-10-CM | POA: Insufficient documentation

## 2019-11-04 DIAGNOSIS — R32 Unspecified urinary incontinence: Secondary | ICD-10-CM | POA: Diagnosis not present

## 2019-11-04 DIAGNOSIS — G43909 Migraine, unspecified, not intractable, without status migrainosus: Secondary | ICD-10-CM | POA: Insufficient documentation

## 2019-11-04 DIAGNOSIS — K219 Gastro-esophageal reflux disease without esophagitis: Secondary | ICD-10-CM | POA: Diagnosis not present

## 2019-11-04 DIAGNOSIS — K8010 Calculus of gallbladder with chronic cholecystitis without obstruction: Secondary | ICD-10-CM | POA: Diagnosis not present

## 2019-11-04 DIAGNOSIS — M1990 Unspecified osteoarthritis, unspecified site: Secondary | ICD-10-CM | POA: Diagnosis not present

## 2019-11-04 DIAGNOSIS — J45909 Unspecified asthma, uncomplicated: Secondary | ICD-10-CM | POA: Insufficient documentation

## 2019-11-04 DIAGNOSIS — Z79899 Other long term (current) drug therapy: Secondary | ICD-10-CM | POA: Insufficient documentation

## 2019-11-04 DIAGNOSIS — K824 Cholesterolosis of gallbladder: Secondary | ICD-10-CM | POA: Diagnosis not present

## 2019-11-04 DIAGNOSIS — K8020 Calculus of gallbladder without cholecystitis without obstruction: Secondary | ICD-10-CM | POA: Diagnosis not present

## 2019-11-04 DIAGNOSIS — Z885 Allergy status to narcotic agent status: Secondary | ICD-10-CM | POA: Insufficient documentation

## 2019-11-04 DIAGNOSIS — Z419 Encounter for procedure for purposes other than remedying health state, unspecified: Secondary | ICD-10-CM

## 2019-11-04 DIAGNOSIS — K838 Other specified diseases of biliary tract: Secondary | ICD-10-CM | POA: Diagnosis not present

## 2019-11-04 HISTORY — PX: CHOLECYSTECTOMY: SHX55

## 2019-11-04 SURGERY — LAPAROSCOPIC CHOLECYSTECTOMY WITH INTRAOPERATIVE CHOLANGIOGRAM
Anesthesia: General

## 2019-11-04 MED ORDER — FENTANYL CITRATE (PF) 100 MCG/2ML IJ SOLN
INTRAMUSCULAR | Status: DC | PRN
  Administered 2019-11-04: 100 ug via INTRAVENOUS

## 2019-11-04 MED ORDER — PROPOFOL 10 MG/ML IV BOLUS
INTRAVENOUS | Status: DC | PRN
  Administered 2019-11-04: 120 mg via INTRAVENOUS

## 2019-11-04 MED ORDER — ONDANSETRON HCL 4 MG/2ML IJ SOLN: 4.0000 mg | Freq: Once | INTRAMUSCULAR | Status: DC | PRN

## 2019-11-04 MED ORDER — FENTANYL CITRATE (PF) 100 MCG/2ML IJ SOLN
INTRAMUSCULAR | Status: AC
  Administered 2019-11-04: 25 ug via INTRAVENOUS
  Filled 2019-11-04: qty 2

## 2019-11-04 MED ORDER — HYDROCODONE-ACETAMINOPHEN 5-325 MG PO TABS: 1.0000 | ORAL_TABLET | ORAL | 0 refills | Status: DC | PRN

## 2019-11-04 MED ORDER — CHLORHEXIDINE GLUCONATE 0.12 % MT SOLN
OROMUCOSAL | Status: AC
  Administered 2019-11-04: 15 mL via OROMUCOSAL
  Filled 2019-11-04: qty 15

## 2019-11-04 MED ORDER — HYDROCODONE-ACETAMINOPHEN 5-325 MG PO TABS
ORAL_TABLET | ORAL | Status: AC
  Filled 2019-11-04: qty 1

## 2019-11-04 MED ORDER — FENTANYL CITRATE (PF) 100 MCG/2ML IJ SOLN
INTRAMUSCULAR | Status: AC
  Filled 2019-11-04: qty 2

## 2019-11-04 MED ORDER — SUGAMMADEX SODIUM 200 MG/2ML IV SOLN
INTRAVENOUS | Status: DC | PRN
  Administered 2019-11-04: 200 mg via INTRAVENOUS

## 2019-11-04 MED ORDER — ACETAMINOPHEN 10 MG/ML IV SOLN
INTRAVENOUS | Status: DC | PRN
  Administered 2019-11-04: 1000 mg via INTRAVENOUS

## 2019-11-04 MED ORDER — MIDAZOLAM HCL 2 MG/2ML IJ SOLN
INTRAMUSCULAR | Status: AC
  Filled 2019-11-04: qty 2

## 2019-11-04 MED ORDER — DEXAMETHASONE SODIUM PHOSPHATE 10 MG/ML IJ SOLN
INTRAMUSCULAR | Status: AC
  Filled 2019-11-04: qty 1

## 2019-11-04 MED ORDER — FENTANYL CITRATE (PF) 100 MCG/2ML IJ SOLN
25.0000 ug | INTRAMUSCULAR | Status: DC | PRN
  Administered 2019-11-04 (×3): 25 ug via INTRAVENOUS

## 2019-11-04 MED ORDER — SODIUM CHLORIDE (PF) 0.9 % IJ SOLN
INTRAMUSCULAR | Status: AC
  Filled 2019-11-04: qty 50

## 2019-11-04 MED ORDER — ACETAMINOPHEN 10 MG/ML IV SOLN
INTRAVENOUS | Status: AC
  Filled 2019-11-04: qty 100

## 2019-11-04 MED ORDER — LIDOCAINE HCL (PF) 2 % IJ SOLN
INTRAMUSCULAR | Status: AC
  Filled 2019-11-04: qty 5

## 2019-11-04 MED ORDER — CHLORHEXIDINE GLUCONATE 0.12 % MT SOLN: 15.0000 mL | Freq: Once | OROMUCOSAL | Status: AC

## 2019-11-04 MED ORDER — MIDAZOLAM HCL 2 MG/2ML IJ SOLN
INTRAMUSCULAR | Status: DC | PRN
  Administered 2019-11-04: 2 mg via INTRAVENOUS

## 2019-11-04 MED ORDER — LACTATED RINGERS IV SOLN: INTRAVENOUS | Status: DC

## 2019-11-04 MED ORDER — ROCURONIUM BROMIDE 100 MG/10ML IV SOLN
INTRAVENOUS | Status: DC | PRN
  Administered 2019-11-04: 50 mg via INTRAVENOUS
  Administered 2019-11-04: 10 mg via INTRAVENOUS

## 2019-11-04 MED ORDER — ONDANSETRON HCL 4 MG/2ML IJ SOLN
INTRAMUSCULAR | Status: DC | PRN
  Administered 2019-11-04: 4 mg via INTRAVENOUS

## 2019-11-04 MED ORDER — ROCURONIUM BROMIDE 10 MG/ML (PF) SYRINGE
PREFILLED_SYRINGE | INTRAVENOUS | Status: AC
  Filled 2019-11-04: qty 10

## 2019-11-04 MED ORDER — PHENYLEPHRINE HCL (PRESSORS) 10 MG/ML IV SOLN
INTRAVENOUS | Status: DC | PRN
  Administered 2019-11-04: 100 ug via INTRAVENOUS
  Administered 2019-11-04: 200 ug via INTRAVENOUS

## 2019-11-04 MED ORDER — PROPOFOL 10 MG/ML IV BOLUS
INTRAVENOUS | Status: AC
  Filled 2019-11-04: qty 20

## 2019-11-04 MED ORDER — DEXAMETHASONE SODIUM PHOSPHATE 10 MG/ML IJ SOLN
INTRAMUSCULAR | Status: DC | PRN
  Administered 2019-11-04: 10 mg via INTRAVENOUS

## 2019-11-04 MED ORDER — ORAL CARE MOUTH RINSE: 15.0000 mL | Freq: Once | OROMUCOSAL | Status: AC

## 2019-11-04 MED ORDER — SODIUM CHLORIDE 0.9 % IV SOLN
INTRAVENOUS | Status: DC | PRN
  Administered 2019-11-04: 10 mL

## 2019-11-04 MED ORDER — HYDROCODONE-ACETAMINOPHEN 5-325 MG PO TABS
1.0000 | ORAL_TABLET | Freq: Once | ORAL | Status: AC
  Administered 2019-11-04: 1 via ORAL

## 2019-11-04 MED ORDER — LIDOCAINE HCL (CARDIAC) PF 100 MG/5ML IV SOSY
PREFILLED_SYRINGE | INTRAVENOUS | Status: DC | PRN
  Administered 2019-11-04: 60 mg via INTRAVENOUS

## 2019-11-04 MED ORDER — KETOROLAC TROMETHAMINE 30 MG/ML IJ SOLN
INTRAMUSCULAR | Status: DC | PRN
  Administered 2019-11-04: 30 mg via INTRAVENOUS

## 2019-11-04 SURGICAL SUPPLY — 47 items
AGENT HMST KT MTR STRL THRMB (HEMOSTASIS) ×1
APL ESCP 34 STRL LF DISP (HEMOSTASIS) ×1
APL PRP STRL LF DISP 70% ISPRP (MISCELLANEOUS) ×1
APPLICATOR SURGIFLO ENDO (HEMOSTASIS) ×1 IMPLANT
APPLIER CLIP ROT 10 11.4 M/L (STAPLE) ×2
APR CLP MED LRG 11.4X10 (STAPLE) ×1
BAG SPEC RTRVL LRG 6X4 10 (ENDOMECHANICALS) ×1
BLADE SURG 11 STRL SS SAFETY (MISCELLANEOUS) ×2 IMPLANT
CANISTER SUCT 1200ML W/VALVE (MISCELLANEOUS) ×2 IMPLANT
CANNULA DILATOR 10 W/SLV (CANNULA) ×2 IMPLANT
CANNULA DILATOR 5 W/SLV (CANNULA) ×4 IMPLANT
CATH CHOLANG 76X19 KUMAR (CATHETERS) ×2 IMPLANT
CHLORAPREP W/TINT 26 (MISCELLANEOUS) ×2 IMPLANT
CLIP APPLIE ROT 10 11.4 M/L (STAPLE) ×1 IMPLANT
CONRAY 60ML FOR OR (MISCELLANEOUS) ×2 IMPLANT
COVER WAND RF STERILE (DRAPES) ×2 IMPLANT
DISSECTOR KITTNER STICK (MISCELLANEOUS) IMPLANT
DISSECTORS/KITTNER STICK (MISCELLANEOUS)
DRAPE C-ARM 42X70 (DRAPES) ×2 IMPLANT
DRSG TEGADERM 2-3/8X2-3/4 SM (GAUZE/BANDAGES/DRESSINGS) ×8 IMPLANT
DRSG TELFA 4X3 1S NADH ST (GAUZE/BANDAGES/DRESSINGS) ×2 IMPLANT
ELECT REM PT RETURN 9FT ADLT (ELECTROSURGICAL) ×2
ELECTRODE REM PT RTRN 9FT ADLT (ELECTROSURGICAL) ×1 IMPLANT
GLOVE BIO SURGEON STRL SZ7.5 (GLOVE) ×2 IMPLANT
GLOVE INDICATOR 8.0 STRL GRN (GLOVE) ×2 IMPLANT
GOWN STRL REUS W/ TWL LRG LVL3 (GOWN DISPOSABLE) ×3 IMPLANT
GOWN STRL REUS W/TWL LRG LVL3 (GOWN DISPOSABLE) ×6
HEMOSTAT SURGICEL 2X14 (HEMOSTASIS) ×1 IMPLANT
IRRIGATION STRYKERFLOW (MISCELLANEOUS) ×1 IMPLANT
IRRIGATOR STRYKERFLOW (MISCELLANEOUS) ×2
IV LACTATED RINGERS 1000ML (IV SOLUTION) ×2 IMPLANT
KIT TURNOVER KIT A (KITS) ×2 IMPLANT
LABEL OR SOLS (LABEL) ×2 IMPLANT
NDL INSUFF ACCESS 14 VERSASTEP (NEEDLE) ×2 IMPLANT
NS IRRIG 500ML POUR BTL (IV SOLUTION) ×2 IMPLANT
PACK LAP CHOLECYSTECTOMY (MISCELLANEOUS) ×2 IMPLANT
POUCH SPECIMEN RETRIEVAL 10MM (ENDOMECHANICALS) ×1 IMPLANT
SCISSORS METZENBAUM CVD 33 (INSTRUMENTS) ×2 IMPLANT
SET TUBE SMOKE EVAC HIGH FLOW (TUBING) ×2 IMPLANT
SLEEVE VERSASTEP EXPAND ONEST (MISCELLANEOUS) ×2 IMPLANT
STRIP CLOSURE SKIN 1/2X4 (GAUZE/BANDAGES/DRESSINGS) ×2 IMPLANT
SURGIFLO W/THROMBIN 8M KIT (HEMOSTASIS) ×1 IMPLANT
SUT VIC AB 0 CT2 27 (SUTURE) IMPLANT
SUT VIC AB 4-0 FS2 27 (SUTURE) ×2 IMPLANT
SWABSTK COMLB BENZOIN TINCTURE (MISCELLANEOUS) ×2 IMPLANT
TROCAR XCEL NON-BLD 11X100MML (ENDOMECHANICALS) ×2 IMPLANT
WATER STERILE IRR 1000ML POUR (IV SOLUTION) ×2 IMPLANT

## 2019-11-04 NOTE — H&P (Signed)
Savannah Gomez [id number] 07-Dec-1955     HPI:  65 year old with symptomatic cholelithiasis. For cholecystectomy.   Medications Prior to Admission  Medication Sig Dispense Refill Last Dose  . acetaminophen (TYLENOL) 500 MG tablet Take 1,000 mg by mouth daily as needed for mild pain or headache.    Past Week at Unknown time  . albuterol (PROVENTIL HFA;VENTOLIN HFA) 108 (90 Base) MCG/ACT inhaler Inhale 2 puffs into the lungs every 6 (six) hours as needed for wheezing or shortness of breath. 1 Inhaler 5 11/04/2019 at Unknown time  . Calcium Carbonate-Vitamin D3 600-400 MG-UNIT TABS Take 600 Units by mouth daily.    Past Week at Unknown time  . cetirizine (ZYRTEC) 10 MG tablet Take 10 mg by mouth daily.   11/03/2019 at Unknown time  . Docusate Calcium (STOOL SOFTENER PO) Take 1 tablet by mouth daily.    Past Week at Unknown time  . fluticasone (FLOVENT HFA) 110 MCG/ACT inhaler Inhale 2 puffs into the lungs 2 (two) times daily. 1 g 10 11/04/2019 at Unknown time  . imipramine (TOFRANIL) 25 MG tablet Take 1 tablet (25 mg total) by mouth 2 (two) times daily. 180 tablet 3 11/04/2019 at Unknown time  . pantoprazole (PROTONIX) 40 MG tablet TAKE 1 TABLET BY MOUTH TWICE A DAY BEFORE A MEAL (Patient taking differently: Take 40 mg by mouth 2 (two) times daily. ) 180 tablet 1 11/04/2019 at Unknown time  . tolterodine (DETROL LA) 4 MG 24 hr capsule Take 1 capsule (4 mg total) by mouth daily. 90 capsule 3 11/03/2019 at Unknown time  . topiramate (TOPAMAX) 25 MG tablet TAKE 1 TABLET BY MOUTH TWICE A DAY (Patient taking differently: Take 25 mg by mouth daily. ) 180 tablet 1 11/04/2019 at Unknown time  . SHINGRIX injection INJECT 0.5 ML INTO THE MUSCLE ONCE FOR 1DOSE (Patient not taking: Reported on 10/26/2019) 1 each 0    Allergies  Allergen Reactions  . Demerol [Meperidine] Nausea Only   Past Medical History:  Diagnosis Date  . Allergy   . Arthritis   . Asthma   . Chicken pox   . Complication of anesthesia   .  GERD (gastroesophageal reflux disease)   . History of kidney stones   . Migraine headache   . PONV (postoperative nausea and vomiting)   . Urine incontinence    Past Surgical History:  Procedure Laterality Date  . BREAST CYST ASPIRATION Bilateral    neg  . carpal tunnel sugery     bilateral  . COLONOSCOPY WITH PROPOFOL N/A 09/20/2018   Procedure: COLONOSCOPY WITH PROPOFOL;  Surgeon: Scot Jun, MD;  Location: [facility] ENDOSCOPY;  Service: Endoscopy;  Laterality: N/A;  . ELBOW SURGERY  1999  . ESOPHAGOGASTRODUODENOSCOPY (EGD) WITH PROPOFOL N/A 09/20/2018   Procedure: ESOPHAGOGASTRODUODENOSCOPY (EGD) WITH PROPOFOL;  Surgeon: Scot Jun, MD;  Location: [facility] ENDOSCOPY;  Service: Endoscopy;  Laterality: N/A;  . HAND SURGERY  1999   broken finger - pins  . NASAL SINUS SURGERY  1992  . ROTATOR CUFF REPAIR  1994  . Trigger finger surgery  1999 and 2000  . VAGINAL HYSTERECTOMY  1987   secondary to fibroids   Social History   Socioeconomic History  . Marital status: Married    Spouse name: Not on file  . Number of children: Not on file  . Years of education: Not on file  . Highest education level: Not on file  Occupational History  . Not on file  Tobacco  Use  . Smoking status: Never Smoker  . Smokeless tobacco: Never Used  Vaping Use  . Vaping Use: Never used  Substance and Sexual Activity  . Alcohol use: No    Alcohol/week: 0.0 standard drinks  . Drug use: No  . Sexual activity: Yes    Birth control/protection: None, Post-menopausal  Other Topics Concern  . Not on file  Social History Narrative  . Not on file   Social Determinants of Health   Financial Resource Strain:   . Difficulty of Paying Living Expenses:   Food Insecurity:   . Worried About Programme researcher, broadcasting/film/video in the Last Year:   . Barista in the Last Year:   Transportation Needs:   . Freight forwarder (Medical):   Marland Kitchen Lack of Transportation (Non-Medical):   Physical Activity:   . Days of  Exercise per Week:   . Minutes of Exercise per Session:   Stress:   . Feeling of Stress :   Social Connections:   . Frequency of Communication with Friends and Family:   . Frequency of Social Gatherings with Friends and Family:   . Attends Religious Services:   . Active Member of Clubs or Organizations:   . Attends Banker Meetings:   Marland Kitchen Marital Status:   Intimate Partner Violence:   . Fear of Current or Ex-Partner:   . Emotionally Abused:   Marland Kitchen Physically Abused:   . Sexually Abused:    Social History   Social History Narrative  . Not on file     ROS: Negative.     PE: HEENT: Negative. Lungs: Clear. Cardio: RR.  Assessment/Plan:  Proceed with planned cholecystectomy.    Merrily Pew [facility] 11/04/2019

## 2019-11-04 NOTE — Op Note (Signed)
Preoperative diagnosis: Chronic cholecystitis and cholelithiasis.  Postoperative diagnosis: Same.  Operative procedure: Laparoscopic cholecystectomy with intraoperative cholangiograms.  Operating surgeon: Donnalee Curry.  Anesthesia: General endotracheal.  Estimated blood loss 100 cc.  Clinical note: This 64 year old woman has had episodic epigastric pain.  Etiology is limited to her known cholelithiasis.  She was admitted for elective cholecystectomy.  Operative note: The patient underwent general anesthesia without difficulty.  The abdomen was cleansed with ChloraPrep and draped.  In Trendelenburg position a varies needle was placed with transumbilical incision.  After assuring intra-abdominal location with a hanging drop test the abdomen was insufflated with CO2 at 10 mmHg pressure.  A 10 mm Neck step port was expanded and inspection showed no evidence of injury from initial port placement.  The patient was placed in reverse Trendelenburg position and rolled to the left.  An 11 mm XL port was placed in the epigastrium under direct vision.  2-5 mm Ports were then placed in the right lateral abdominal wall.  There were noted to be few adhesions between the gallbladder and the omentum.  The gallbladder was placed on cephalad traction.  Few filmy adhesions of the cholecystoduodenal ligament were taken down with cautery dissection.  The common bile duct was well visualized.  The cystic duct was cleared and fluoroscopic cholangiograms completed using 30 cc of one half strength Conray 60.  This showed a long narrow cystic duct and then widening into the common bile duct with scant flushing of the common hepatic duct.  Free flow into the duodenum.  Positioning the table to the right and reinjecting did not feel any more of the common hepatic duct.  The cystic duct and cystic artery were then doubly clipped and divided.  The gallbladder was removed from the liver bed making use of hook cautery dissection.   There appeared to be a vein in the gallbladder bed that was sheared while the gallbladder was being removed.  This accounted for all of the bleeding.  Cautery was not sufficient.  A Surgicel pad was placed and there was still bleeding.  10 cc of Surgi-Flo was placed with good hemostasis noted.  The abdomen was desufflated and after 5 minutes reinspection showed no evidence of bleeding at the site.  The abdomen was irrigated copiously with lactated Ringer solution.  The abdomen was then desufflated and ports removed under direct vision.  The fascia at the umbilicus was closed with a single 0 Vicryl suture.  The skin incisions were closed with 4-0 Vicryl subcuticular sutures.  Benzoin, Steri-Strips, Telfa and Tegaderm dressings were then applied.  Patient tolerated the procedure well and was taken recovery in stable condition.

## 2019-11-04 NOTE — Anesthesia Preprocedure Evaluation (Signed)
Anesthesia Evaluation  Patient identified by MRN, date of birth, ID band Patient awake    Reviewed: Allergy & Precautions, H&P , NPO status , Patient's Chart, lab work & pertinent test results, reviewed documented beta blocker date and time   History of Anesthesia Complications (+) PONV and history of anesthetic complications  Airway Mallampati: II  TM Distance: >3 FB Neck ROM: full    Dental  (+) Teeth Intact   Pulmonary shortness of breath and with exertion, asthma ,    Pulmonary exam normal        Cardiovascular Exercise Tolerance: Good negative cardio ROS Normal cardiovascular exam Rhythm:regular Rate:Normal     Neuro/Psych  Headaches, negative psych ROS   GI/Hepatic Neg liver ROS, GERD  Medicated,  Endo/Other  negative endocrine ROS  Renal/GU negative Renal ROS  negative genitourinary   Musculoskeletal   Abdominal   Peds  Hematology negative hematology ROS (+)   Anesthesia Other Findings Past Medical History: No date: Allergy No date: Arthritis No date: Asthma No date: Chicken pox No date: Complication of anesthesia No date: GERD (gastroesophageal reflux disease) No date: History of kidney stones No date: Migraine headache No date: PONV (postoperative nausea and vomiting) No date: Urine incontinence Past Surgical History: No date: BREAST CYST ASPIRATION; Bilateral     Comment:  neg No date: carpal tunnel sugery     Comment:  bilateral 09/20/2018: COLONOSCOPY WITH PROPOFOL; N/A     Comment:  Procedure: COLONOSCOPY WITH PROPOFOL;  Surgeon: Scot Jun, MD;  Location: [facility] ENDOSCOPY;  Service:               Endoscopy;  Laterality: N/A; 1999: ELBOW SURGERY 09/20/2018: ESOPHAGOGASTRODUODENOSCOPY (EGD) WITH PROPOFOL; N/A     Comment:  Procedure: ESOPHAGOGASTRODUODENOSCOPY (EGD) WITH               PROPOFOL;  Surgeon: Scot Jun, MD;  Location:               [facility] ENDOSCOPY;   Service: Endoscopy;  Laterality: N/A; 1999: HAND SURGERY     Comment:  broken finger - pins 1992: NASAL SINUS SURGERY 1994: ROTATOR CUFF REPAIR 1999 and 2000: Trigger finger surgery 1987: VAGINAL HYSTERECTOMY     Comment:  secondary to fibroids   Reproductive/Obstetrics negative OB ROS                             Anesthesia Physical Anesthesia Plan  ASA: II  Anesthesia Plan: General ETT   Post-op Pain Management:    Induction:   PONV Risk Score and Plan:   Airway Management Planned:   Additional Equipment:   Intra-op Plan:   Post-operative Plan:   Informed Consent: I have reviewed the patients History and Physical, chart, labs and discussed the procedure including the risks, benefits and alternatives for the proposed anesthesia with the patient or authorized representative who has indicated his/her understanding and acceptance.     Dental Advisory Given  Plan Discussed with: CRNA  Anesthesia Plan Comments:         Anesthesia Quick Evaluation

## 2019-11-04 NOTE — Anesthesia Postprocedure Evaluation (Signed)
Anesthesia Post Note  Patient: Savannah Gomez  Procedure(s) Performed: LAPAROSCOPIC CHOLECYSTECTOMY WITH INTRAOPERATIVE CHOLANGIOGRAM (N/A )  Patient location during evaluation: PACU Anesthesia Type: General Level of consciousness: awake and alert and oriented Pain management: pain level controlled Vital Signs Assessment: post-procedure vital signs reviewed and stable Respiratory status: spontaneous breathing, nonlabored ventilation and respiratory function stable Cardiovascular status: blood pressure returned to baseline and stable Postop Assessment: no signs of nausea or vomiting Anesthetic complications: no   No complications documented.   Last Vitals:  Vitals:   11/04/19 1025 11/04/19 1430  BP: (!) 141/68 (!) 118/50  Pulse: 83 87  Resp: 16 (!) 25  Temp: 36.6 C (!) 36.3 C  SpO2: 100% 100%    Last Pain:  Vitals:   11/04/19 1430  PainSc: 0-No pain                 Dejai Schubach

## 2019-11-04 NOTE — Transfer of Care (Signed)
Immediate Anesthesia Transfer of Care Note  Patient: Savannah Gomez  Procedure(s) Performed: LAPAROSCOPIC CHOLECYSTECTOMY WITH INTRAOPERATIVE CHOLANGIOGRAM (N/A )  Patient Location: PACU  Anesthesia Type:General  Level of Consciousness: sedated  Airway & Oxygen Therapy: Patient Spontanous Breathing and Patient connected to face mask oxygen  Post-op Assessment: Report given to RN and Post -op Vital signs reviewed and stable  Post vital signs: Reviewed and stable  Last Vitals:  Vitals Value Taken Time  BP 118/50 11/04/19 1429  Temp    Pulse 87 11/04/19 1430  Resp 25 11/04/19 1430  SpO2 100 % 11/04/19 1430  Vitals shown include unvalidated device data.  Last Pain:  Vitals:   11/04/19 1025  PainSc: 0-No pain         Complications: No complications documented.

## 2019-11-04 NOTE — Discharge Instructions (Signed)

## 2019-11-04 NOTE — OR Nursing (Signed)
Dr. Lemar Livings in to see pt phase II, advises ok to discharge to home.

## 2019-11-04 NOTE — Anesthesia Procedure Notes (Signed)
Procedure Name: Intubation Performed by: Gaynelle Cage, CRNA Pre-anesthesia Checklist: Patient identified, Emergency Drugs available, Suction available and Patient being monitored Patient Re-evaluated:Patient Re-evaluated prior to induction Oxygen Delivery Method: Circle system utilized Preoxygenation: Pre-oxygenation with 100% oxygen Induction Type: IV induction and Cricoid Pressure applied Ventilation: Mask ventilation without difficulty and Oral airway inserted - appropriate to patient size Laryngoscope Size: Mac and 3 Grade View: Grade III Tube type: Oral Tube size: 7.0 mm Number of attempts: 1 Airway Equipment and Method: Oral airway Placement Confirmation: ETT inserted through vocal cords under direct vision,  positive ETCO2 and breath sounds checked- equal and bilateral Secured at: 21 cm Tube secured with: Tape Dental Injury: Teeth and Oropharynx as per pre-operative assessment

## 2019-11-07 ENCOUNTER — Encounter: Payer: Self-pay | Admitting: General Surgery

## 2019-11-08 LAB — SURGICAL PATHOLOGY

## 2019-11-10 ENCOUNTER — Encounter: Payer: Self-pay | Admitting: Urology

## 2019-11-10 ENCOUNTER — Ambulatory Visit: Payer: Medicare HMO | Admitting: Urology

## 2019-11-10 ENCOUNTER — Other Ambulatory Visit: Payer: Self-pay

## 2019-11-10 VITALS — BP 147/85 | HR 83 | Ht 63.0 in | Wt 176.5 lb

## 2019-11-10 DIAGNOSIS — N200 Calculus of kidney: Secondary | ICD-10-CM | POA: Diagnosis not present

## 2019-11-10 DIAGNOSIS — N3010 Interstitial cystitis (chronic) without hematuria: Secondary | ICD-10-CM

## 2019-11-10 NOTE — Progress Notes (Signed)
11/10/2019 10:49 AM   Savannah Gomez 1955-10-22 [id number]  Referring provider: Dale Morgan, MD [address] [place],  [place] [address]  Chief Complaint  Patient presents with  . Cystitis    Urologic history: 1.Interstitial cystitis -Imipramine 50 mg daily -Tolterodine 4 mg daily  2.Left nephrolithiasis -4 mm nonobstructing left lower pole calculus   HPI: 64 y.o. female presents for annual follow-up.   Doing well from a urologic standpoint since her visit last year  Status post cholecystectomy 11/04/2019  Stable voiding and pelvic symptoms on imipramine and tolterodine  No renal colic/stone symptoms  Denies recurrent UTI   PMH: Past Medical History:  Diagnosis Date  . Allergy   . Arthritis   . Asthma   . Chicken pox   . Complication of anesthesia   . GERD (gastroesophageal reflux disease)   . History of kidney stones   . Migraine headache   . PONV (postoperative nausea and vomiting)   . Urine incontinence     Surgical History: Past Surgical History:  Procedure Laterality Date  . BREAST CYST ASPIRATION Bilateral    neg  . carpal tunnel sugery     bilateral  . CHOLECYSTECTOMY N/A 11/04/2019   Procedure: LAPAROSCOPIC CHOLECYSTECTOMY WITH INTRAOPERATIVE CHOLANGIOGRAM;  Surgeon: Earline Mayotte, MD;  Location: [facility] ORS;  Service: General;  Laterality: N/A;  . COLONOSCOPY WITH PROPOFOL N/A 09/20/2018   Procedure: COLONOSCOPY WITH PROPOFOL;  Surgeon: Scot Jun, MD;  Location: [facility] ENDOSCOPY;  Service: Endoscopy;  Laterality: N/A;  . ELBOW SURGERY  1999  . ESOPHAGOGASTRODUODENOSCOPY (EGD) WITH PROPOFOL N/A 09/20/2018   Procedure: ESOPHAGOGASTRODUODENOSCOPY (EGD) WITH PROPOFOL;  Surgeon: Scot Jun, MD;  Location: [facility] ENDOSCOPY;  Service: Endoscopy;  Laterality: N/A;  . HAND SURGERY  1999   broken finger - pins  . NASAL SINUS SURGERY  1992  . ROTATOR CUFF REPAIR  1994  . Trigger finger surgery  1999  and 2000  . VAGINAL HYSTERECTOMY  1987   secondary to fibroids    Home Medications:  Allergies as of 11/10/2019      Reactions   Demerol [meperidine] Nausea Only      Medication List       Accurate as of November 10, 2019 10:49 AM. If you have any questions, ask your nurse or doctor.        acetaminophen 500 MG tablet Commonly known as: TYLENOL Take 1,000 mg by mouth daily as needed for mild pain or headache.   albuterol 108 (90 Base) MCG/ACT inhaler Commonly known as: VENTOLIN HFA Inhale 2 puffs into the lungs every 6 (six) hours as needed for wheezing or shortness of breath.   Calcium Carbonate-Vitamin D3 600-400 MG-UNIT Tabs Take 600 Units by mouth daily.   cetirizine 10 MG tablet Commonly known as: ZYRTEC Take 10 mg by mouth daily.   fluticasone 110 MCG/ACT inhaler Commonly known as: FLOVENT HFA Inhale 2 puffs into the lungs 2 (two) times daily.   HYDROcodone-acetaminophen 5-325 MG tablet Commonly known as: NORCO/VICODIN Take 1 tablet by mouth every 4 (four) hours as needed for moderate pain.   imipramine 25 MG tablet Commonly known as: TOFRANIL Take 1 tablet (25 mg total) by mouth 2 (two) times daily.   pantoprazole 40 MG tablet Commonly known as: PROTONIX TAKE 1 TABLET BY MOUTH TWICE A DAY BEFORE A MEAL What changed: See the new instructions.   Shingrix injection Generic drug: Zoster Vaccine Adjuvanted INJECT 0.5 ML INTO THE MUSCLE ONCE FOR 1DOSE  STOOL SOFTENER PO Take 1 tablet by mouth daily.   tolterodine 4 MG 24 hr capsule Commonly known as: DETROL LA Take 1 capsule (4 mg total) by mouth daily.   topiramate 25 MG tablet Commonly known as: TOPAMAX TAKE 1 TABLET BY MOUTH TWICE A DAY What changed: when to take this       Allergies:  Allergies  Allergen Reactions  . Demerol [Meperidine] Nausea Only    Family History: Family History  Problem Relation Age of Onset  . Colon cancer Mother        colon  . Asthma Mother   . Heart disease  Father   . Hypertension Father   . Colon cancer Sister   . Stroke Maternal Grandfather   . Diabetes Maternal Grandfather   . Lung cancer Maternal Grandmother   . Breast cancer Maternal Grandmother     Social History:  reports that she has never smoked. She has never used smokeless tobacco. She reports that she does not drink alcohol and does not use drugs.   Physical Exam: There were no vitals taken for this visit.  Constitutional:  Alert and oriented, No acute distress. HEENT: [place] AT, moist mucus membranes.  Trachea midline, no masses. Cardiovascular: No clubbing, cyanosis, or edema. Respiratory: Normal respiratory effort, no increased work of breathing. Skin: No rashes, bruises or suspicious lesions. Neurologic: Grossly intact, no focal deficits, moving all 4 extremities. Psychiatric: Normal mood and affect.   Assessment & Plan:    1.  Interstitial cystitis  Stable on imipramine and tolterodine  Medications refilled  Continue annual follow-up  2.  Nephrolithiasis  Asymptomatic nonobstructing renal calculus  KUB ordered and will call with results   Riki Altes, MD  [facility] [address], [address] [place], [place] [address] [phone]

## 2019-11-12 ENCOUNTER — Encounter: Payer: Self-pay | Admitting: Urology

## 2019-11-12 MED ORDER — IMIPRAMINE HCL 25 MG PO TABS: 25.0000 mg | ORAL_TABLET | Freq: Two times a day (BID) | ORAL | 3 refills | Status: DC

## 2019-11-15 DIAGNOSIS — R69 Illness, unspecified: Secondary | ICD-10-CM | POA: Diagnosis not present

## 2019-11-17 ENCOUNTER — Ambulatory Visit
Admission: RE | Admit: 2019-11-17 | Discharge: 2019-11-17 | Disposition: A | Discharge status: Home / Self Care | Payer: Medicare HMO | Attending: Urology | Admitting: Urology

## 2019-11-17 ENCOUNTER — Ambulatory Visit
Admission: RE | Admit: 2019-11-17 | Discharge: 2019-11-17 | Disposition: A | Discharge status: Home / Self Care | Payer: Medicare HMO | Source: Ambulatory Visit | Attending: Urology | Admitting: Urology

## 2019-11-17 ENCOUNTER — Other Ambulatory Visit: Payer: Medicare HMO

## 2019-11-17 ENCOUNTER — Other Ambulatory Visit: Payer: Self-pay

## 2019-11-17 ENCOUNTER — Other Ambulatory Visit (INDEPENDENT_AMBULATORY_CARE_PROVIDER_SITE_OTHER): Payer: Medicare HMO

## 2019-11-17 DIAGNOSIS — R748 Abnormal levels of other serum enzymes: Secondary | ICD-10-CM | POA: Diagnosis not present

## 2019-11-17 DIAGNOSIS — N200 Calculus of kidney: Secondary | ICD-10-CM

## 2019-11-17 DIAGNOSIS — N2889 Other specified disorders of kidney and ureter: Secondary | ICD-10-CM | POA: Diagnosis not present

## 2019-11-18 ENCOUNTER — Telehealth: Payer: Self-pay | Admitting: *Deleted

## 2019-11-18 NOTE — Telephone Encounter (Signed)
-----   Message from Scott C Stoioff, MD sent at 11/18/2019  6:45 AM EDT ----- KUB reviewed and there is a small calcification overlying the left renal outline which appears smaller than the stone seen on CT May 2020.  Follow-up as scheduled and call earlier for any pain similar to previous kidney stone pain 

## 2019-11-21 ENCOUNTER — Telehealth: Payer: Self-pay | Admitting: Internal Medicine

## 2019-11-21 ENCOUNTER — Other Ambulatory Visit: Payer: Self-pay | Admitting: Urology

## 2019-11-21 DIAGNOSIS — R748 Abnormal levels of other serum enzymes: Secondary | ICD-10-CM

## 2019-11-21 LAB — ALKALINE PHOSPHATASE, ISOENZYMES
Alkaline Phosphatase: 180 IU/L — ABNORMAL HIGH (ref 48–121)
BONE FRACTION: 27 % (ref 14–68)
INTESTINAL FRAC.: 0 % (ref 0–18)
LIVER FRACTION: 73 % (ref 18–85)

## 2019-11-21 NOTE — Telephone Encounter (Signed)
Pt changed her mind about GI referral. She would like to see Dr Rhea Belton in [place] instead of going to [place].

## 2019-11-21 NOTE — Telephone Encounter (Signed)
Notified patient as instructed, patient pleased. Discussed follow-up appointments, patient agrees  

## 2019-11-21 NOTE — Telephone Encounter (Signed)
-----   Message from Riki Altes, MD sent at 11/18/2019  6:45 AM EDT ----- KUB reviewed and there is a small calcification overlying the left renal outline which appears smaller than the stone seen on CT May 2020.  Follow-up as scheduled and call earlier for any pain similar to previous kidney stone pain

## 2019-11-21 NOTE — Telephone Encounter (Signed)
Pt states that she would like to go to Dr Rhea Belton at [facility] in [place]

## 2019-11-21 NOTE — Telephone Encounter (Signed)
Order placed for referral to Dr Rhea Belton - for elevated alkaline phos

## 2019-11-22 ENCOUNTER — Encounter: Payer: Self-pay | Admitting: Gastroenterology

## 2019-11-23 ENCOUNTER — Other Ambulatory Visit: Payer: Self-pay

## 2019-11-23 MED ORDER — TOLTERODINE TARTRATE ER 4 MG PO CP24: 4.0000 mg | ORAL_CAPSULE | Freq: Every day | ORAL | 3 refills | Status: DC

## 2019-11-29 ENCOUNTER — Other Ambulatory Visit: Payer: Self-pay

## 2019-11-29 ENCOUNTER — Ambulatory Visit
Admission: RE | Admit: 2019-11-29 | Discharge: 2019-11-29 | Disposition: A | Discharge status: Home / Self Care | Payer: Medicare HMO | Source: Ambulatory Visit | Attending: Internal Medicine | Admitting: Internal Medicine

## 2019-11-29 DIAGNOSIS — Z1231 Encounter for screening mammogram for malignant neoplasm of breast: Secondary | ICD-10-CM | POA: Insufficient documentation

## 2019-12-01 DIAGNOSIS — I499 Cardiac arrhythmia, unspecified: Secondary | ICD-10-CM | POA: Diagnosis not present

## 2019-12-01 DIAGNOSIS — R079 Chest pain, unspecified: Secondary | ICD-10-CM | POA: Diagnosis not present

## 2019-12-06 DIAGNOSIS — R69 Illness, unspecified: Secondary | ICD-10-CM | POA: Diagnosis not present

## 2019-12-21 ENCOUNTER — Other Ambulatory Visit: Payer: Self-pay | Admitting: *Deleted

## 2019-12-21 DIAGNOSIS — E785 Hyperlipidemia, unspecified: Secondary | ICD-10-CM

## 2019-12-30 ENCOUNTER — Ambulatory Visit
Admission: RE | Admit: 2019-12-30 | Discharge: 2019-12-30 | Disposition: A | Discharge status: Home / Self Care | Payer: Self-pay | Source: Ambulatory Visit | Attending: Cardiology | Admitting: Cardiology

## 2019-12-30 ENCOUNTER — Other Ambulatory Visit: Payer: Self-pay

## 2019-12-30 DIAGNOSIS — E785 Hyperlipidemia, unspecified: Secondary | ICD-10-CM

## 2020-01-11 ENCOUNTER — Encounter: Payer: Self-pay | Admitting: Gastroenterology

## 2020-01-11 ENCOUNTER — Ambulatory Visit: Payer: Medicare HMO | Admitting: Gastroenterology

## 2020-01-11 VITALS — BP 122/80 | HR 88 | Ht 63.0 in | Wt 176.0 lb

## 2020-01-11 DIAGNOSIS — R748 Abnormal levels of other serum enzymes: Secondary | ICD-10-CM

## 2020-01-11 DIAGNOSIS — R112 Nausea with vomiting, unspecified: Secondary | ICD-10-CM

## 2020-01-11 MED ORDER — FAMOTIDINE 20 MG PO TABS
20.0000 mg | ORAL_TABLET | Freq: Two times a day (BID) | ORAL | 3 refills | Status: DC
Start: 2020-01-11 — End: 2020-05-15

## 2020-01-11 NOTE — Progress Notes (Signed)
Referring Provider: Einar Pheasant, MD Primary Care Physician:  Einar Pheasant, MD  Reason for Consultation:  Elevated alk phos   IMPRESSION:  Abnormal alk phos Nausea and RUQ/midepigastric pain - persistent despite cholecystectomy Family history of colon cancer (mother at age 65, sister in her 51s)  Elevated alkaline phosphatase without extrahepatic biliary dilatation on ultrasound or CT scan. Will proceed with serologic evaluation for common causes of intrahepatic cholestasis and infiltrative liver disease. No obvious medications. If labs suggest fatty liver, will proceed with addition evaluation to clarify the extent of inflammation and fibrosis. MRI/MRCP recommended for further evaluation of the hepatic parenchyma and biliary tree given her ongoing nausea and RUQ pain. I am hopeful that a liver biopsy can be avoided.  Nausea and RUQ: Unclear if this is biliary in etiology. EGD last year revealed a hiatal hernia but was otherwise normal.  Will add famotidine BID to her PPI. Await results of MRI prior to proceeding with further evaluation.   Family of colon cancer: Last colonoscopy 2000. Repeat colonoscopy in 2025, earlier with new symptoms.   PLAN: Famotidine 20 mg BID ANA, AMA, IgG, IgM, SMAb, fastin insulin and glucose, ferritin, iron MRI with MRCP Follow-up after the MRI  Please see the "Patient Instructions" section for addition details about the plan.  HPI: Savannah Gomez is a 64 y.o. female referred by Dr. Nicki Reaper for abnormal liver enzymes.  Alk phos has become progressively more elevated. Initially elevated 01/2018. Other liver enzymes are normal. Alk phos has fluctuated since that time, but never more than 1.5x ULN. No improvement after cholecystectomy 11/04/19 for chronic cholecystitis, cholelithiasis.  No prior knowledge of elevated liver enzymes. No prior blood transfusion.  No history of jaundice or scleral icterus.  No history of use or experimentation with IV or  intranasal street drugs.  No history of autoimmune disease.  No family history of liver disease. No history of diabetes, insulin resistance, or hypercholesterolemia. No use of OTC or herbal therapies.   Has chronic GI symptoms.  Frequent regurgitation, nausea, and chest pain that radiates to the back. Symptoms fluctuate but nausea is most prominent. Has constant globus not related to these symptoms. No identified triggers or relieving features. Has been on pantoprazole 40 mg BID for several years for reflux. Using Mylanta and TUMS with some relief.   Prone to constipation. BM every 2-3 days. Uses stool softeners BID. No blood or mucous.   Abd ultrasound 06/09/18: no findings, no stones CT abd/pelvis with contrast scan 07/26/18: diverticulosis, no acute findings EGD for RUQ pain and Colonoscopy 08/2018 with Dr. Vira Agar at the [facility]: hiatal hernia, no H pylori, small hyperplastic polyp, diverticulosis, small internal hemorrhoids  Mom with colon cancer at age 59. Sister with colon in her 22s. No known family history of colon cancer or polyps. No family history of uterine/endometrial cancer, pancreatic cancer or gastric/stomach cancer.   Past Medical History:  Diagnosis Date  . Allergy   . Arthritis   . Asthma   . Chicken pox   . Complication of anesthesia   . GERD (gastroesophageal reflux disease)   . History of kidney stones   . Migraine headache   . PONV (postoperative nausea and vomiting)   . Urine incontinence     Past Surgical History:  Procedure Laterality Date  . BREAST CYST ASPIRATION Bilateral    neg  . carpal tunnel sugery     bilateral  . CHOLECYSTECTOMY N/A 11/04/2019   Procedure: LAPAROSCOPIC CHOLECYSTECTOMY WITH INTRAOPERATIVE CHOLANGIOGRAM;  Surgeon: Robert Bellow, MD;  Location: [facility] ORS;  Service: General;  Laterality: N/A;  . COLONOSCOPY WITH PROPOFOL N/A 09/20/2018   Procedure: COLONOSCOPY WITH PROPOFOL;  Surgeon: Manya Silvas, MD;  Location: [facility]  ENDOSCOPY;  Service: Endoscopy;  Laterality: N/A;  . ELBOW SURGERY  1999  . ESOPHAGOGASTRODUODENOSCOPY (EGD) WITH PROPOFOL N/A 09/20/2018   Procedure: ESOPHAGOGASTRODUODENOSCOPY (EGD) WITH PROPOFOL;  Surgeon: Manya Silvas, MD;  Location: [facility] ENDOSCOPY;  Service: Endoscopy;  Laterality: N/A;  . HAND SURGERY  1999   broken finger - pins  . NASAL SINUS SURGERY  1992  . ROTATOR CUFF REPAIR  1994  . Trigger finger surgery  1999 and 2000  . VAGINAL HYSTERECTOMY  1987   secondary to fibroids    Current Outpatient Medications  Medication Sig Dispense Refill  . acetaminophen (TYLENOL) 500 MG tablet Take 1,000 mg by mouth daily as needed for mild pain or headache.     . albuterol (PROVENTIL HFA;VENTOLIN HFA) 108 (90 Base) MCG/ACT inhaler Inhale 2 puffs into the lungs every 6 (six) hours as needed for wheezing or shortness of breath. 1 Inhaler 5  . Calcium Carbonate-Vitamin D3 600-400 MG-UNIT TABS Take 600 Units by mouth daily.     . cetirizine (ZYRTEC) 10 MG tablet Take 10 mg by mouth daily.    Mariane Baumgarten Calcium (STOOL SOFTENER PO) Take 1 tablet by mouth daily.     . fluticasone (FLOVENT HFA) 110 MCG/ACT inhaler Inhale 2 puffs into the lungs 2 (two) times daily. 1 g 10  . HYDROcodone-acetaminophen (NORCO/VICODIN) 5-325 MG tablet Take 1 tablet by mouth every 4 (four) hours as needed for moderate pain. 10 tablet 0  . imipramine (TOFRANIL) 25 MG tablet Take 1 tablet (25 mg total) by mouth 2 (two) times daily. 180 tablet 3  . pantoprazole (PROTONIX) 40 MG tablet TAKE 1 TABLET BY MOUTH TWICE A DAY BEFORE A MEAL (Patient taking differently: Take 40 mg by mouth 2 (two) times daily. ) 180 tablet 1  . SHINGRIX injection INJECT 0.5 ML INTO THE MUSCLE ONCE FOR 1DOSE 1 each 0  . tolterodine (DETROL LA) 4 MG 24 hr capsule Take 1 capsule (4 mg total) by mouth daily. 90 capsule 3  . topiramate (TOPAMAX) 25 MG tablet TAKE 1 TABLET BY MOUTH TWICE A DAY (Patient taking differently: Take 25 mg by mouth daily. )  180 tablet 1   No current facility-administered medications for this visit.    Allergies as of 01/11/2020 - Review Complete 11/12/2019  Allergen Reaction Noted  . Demerol [meperidine] Nausea Only 01/16/2012    Family History  Problem Relation Age of Onset  . Colon cancer Mother        colon  . Asthma Mother   . Heart disease Father   . Hypertension Father   . Colon cancer Sister   . Stroke Maternal Grandfather   . Diabetes Maternal Grandfather   . Lung cancer Maternal Grandmother   . Breast cancer Maternal Grandmother     Social History   Socioeconomic History  . Marital status: Married    Spouse name: Not on file  . Number of children: Not on file  . Years of education: Not on file  . Highest education level: Not on file  Occupational History  . Not on file  Tobacco Use  . Smoking status: Never Smoker  . Smokeless tobacco: Never Used  Vaping Use  . Vaping Use: Never used  Substance and Sexual Activity  . Alcohol use:  No    Alcohol/week: 0.0 standard drinks  . Drug use: No  . Sexual activity: Yes    Birth control/protection: None, Post-menopausal  Other Topics Concern  . Not on file  Social History Narrative  . Not on file   Social Determinants of Health   Financial Resource Strain:   . Difficulty of Paying Living Expenses: Not on file  Food Insecurity:   . Worried About Charity fundraiser in the Last Year: Not on file  . Ran Out of Food in the Last Year: Not on file  Transportation Needs:   . Lack of Transportation (Medical): Not on file  . Lack of Transportation (Non-Medical): Not on file  Physical Activity:   . Days of Exercise per Week: Not on file  . Minutes of Exercise per Session: Not on file  Stress:   . Feeling of Stress : Not on file  Social Connections:   . Frequency of Communication with Friends and Family: Not on file  . Frequency of Social Gatherings with Friends and Family: Not on file  . Attends Religious Services: Not on file  .  Active Member of Clubs or Organizations: Not on file  . Attends Archivist Meetings: Not on file  . Marital Status: Not on file  Intimate Partner Violence:   . Fear of Current or Ex-Partner: Not on file  . Emotionally Abused: Not on file  . Physically Abused: Not on file  . Sexually Abused: Not on file    Review of Systems: 12 system ROS is negative except as noted above with the addition of cough, fatigue, headaches, itching, shortness of breath, urine leakage, and voice changes.   Physical Exam: General:   Alert,  well-nourished, pleasant and cooperative in NAD Head:  Normocephalic and atraumatic. Eyes:  Sclera clear, no icterus.   Conjunctiva pink. No xanthelasma.  Ears:  Normal auditory acuity. Nose:  No deformity, discharge,  or lesions. Mouth:  No deformity or lesions.   Neck:  Supple; no masses or thyromegaly. Lungs:  Clear throughout to auscultation.   No wheezes. Heart:  Regular rate and rhythm; no murmurs. Abdomen:  Soft, I am unable to reproduce her pain but she localizes it to the midline port scar from her cholecystectomy, nondistended, normal bowel sounds, no rebound or guarding. No hepatosplenomegaly.  Msk:  Symmetrical. No boney deformities LAD: No inguinal or umbilical LAD Extremities:  No clubbing or edema. Neurologic:  Alert and  oriented x4;  grossly nonfocal Skin: No rash or bruise. No spider angioma, palmar erythema, or Terry nails.  Psych:  Alert and cooperative. Normal mood and affect.     Tali Cleaves L. Tarri Glenn, MD, MPH 01/11/2020, 1:22 PM

## 2020-01-11 NOTE — Patient Instructions (Addendum)
If you are age 65 or older, your body mass index should be between 23-30. Your Body mass index is 31.18 kg/m. If this is out of the aforementioned range listed, please consider follow up with your Primary Care Provider.  If you are age 39 or younger, your body mass index should be between 19-25. Your Body mass index is 31.18 kg/m. If this is out of the aformentioned range listed, please consider follow up with your Primary Care Provider.   Your provider has requested that you go to the basement level for lab work before leaving today. Press "B" on the elevator. The lab is located at the first door on the left as you exit the elevator.  You have been scheduled for an MRI/ MRCP at [facility] on 01-30-2020. Your appointment time is 9am. Please arrive 30 minutes prior to your appointment time for registration purposes. Please make certain not to have anything to eat or drink 6 hours prior to your test. In addition, if you have any metal in your body, have a pacemaker or defibrillator, please be sure to let your ordering physician know. This test typically takes 45 minutes to 1 hour to complete. Should you need to reschedule, please call [phone] to do so.  Due to recent changes in healthcare laws, you may see the results of your imaging and laboratory studies on MyChart before your provider has had a chance to review them.  We understand that in some cases there may be results that are confusing or concerning to you. Not all laboratory results come back in the same time frame and the provider may be waiting for multiple results in order to interpret others.  Please give [place] 48 hours in order for your provider to thoroughly review all the results before contacting the office for clarification of your results.   Thank you for trusting me with your gastrointestinal care!    Tressia Danas, MD, MPH

## 2020-01-12 ENCOUNTER — Other Ambulatory Visit (INDEPENDENT_AMBULATORY_CARE_PROVIDER_SITE_OTHER): Payer: Medicare HMO

## 2020-01-12 DIAGNOSIS — R748 Abnormal levels of other serum enzymes: Secondary | ICD-10-CM | POA: Diagnosis not present

## 2020-01-12 DIAGNOSIS — R112 Nausea with vomiting, unspecified: Secondary | ICD-10-CM

## 2020-01-12 LAB — GLUCOSE, RANDOM: Glucose, Bld: 94 mg/dL (ref 70–99)

## 2020-01-15 LAB — MITOCHONDRIAL ANTIBODIES: Mitochondrial M2 Ab, IgG: <=20 U

## 2020-01-15 LAB — ANTI-SMOOTH MUSCLE ANTIBODY, IGG: Actin (Smooth Muscle) Antibody (IGG): <20 U (ref <20)

## 2020-01-15 LAB — IGG: IgG (Immunoglobin G), Serum: 689 mg/dL (ref 600–1540)

## 2020-01-15 LAB — IGM: IgM, Serum: 70 mg/dL (ref 50–300)

## 2020-01-15 LAB — ANA: Anti Nuclear Antibody (ANA): NEGATIVE

## 2020-01-17 ENCOUNTER — Ambulatory Visit (INDEPENDENT_AMBULATORY_CARE_PROVIDER_SITE_OTHER): Payer: Medicare HMO | Admitting: Internal Medicine

## 2020-01-17 ENCOUNTER — Encounter: Payer: Self-pay | Admitting: Internal Medicine

## 2020-01-17 ENCOUNTER — Other Ambulatory Visit: Payer: Self-pay

## 2020-01-17 VITALS — BP 118/84 | HR 80 | Temp 98.5°F | Ht 63.0 in | Wt 181.0 lb

## 2020-01-17 DIAGNOSIS — E7800 Pure hypercholesterolemia, unspecified: Secondary | ICD-10-CM

## 2020-01-17 DIAGNOSIS — R748 Abnormal levels of other serum enzymes: Secondary | ICD-10-CM | POA: Diagnosis not present

## 2020-01-17 DIAGNOSIS — K219 Gastro-esophageal reflux disease without esophagitis: Secondary | ICD-10-CM | POA: Diagnosis not present

## 2020-01-17 DIAGNOSIS — Z23 Encounter for immunization: Secondary | ICD-10-CM

## 2020-01-17 DIAGNOSIS — J4520 Mild intermittent asthma, uncomplicated: Secondary | ICD-10-CM | POA: Diagnosis not present

## 2020-01-17 LAB — LIPID PANEL
Cholesterol: 204 mg/dL — ABNORMAL HIGH (ref 0–200)
HDL: 98.5 mg/dL (ref >39.00)
LDL Cholesterol: 93 mg/dL (ref 0–99)
NonHDL: 105.24
Total CHOL/HDL Ratio: 2
Triglycerides: 59 mg/dL (ref 0.0–149.0)
VLDL: 11.8 mg/dL (ref 0.0–40.0)

## 2020-01-17 LAB — BASIC METABOLIC PANEL
BUN: 10 mg/dL (ref 6–23)
CO2: 25 mEq/L (ref 19–32)
Calcium: 9.3 mg/dL (ref 8.4–10.5)
Chloride: 105 mEq/L (ref 96–112)
Creatinine, Ser: 0.94 mg/dL (ref 0.40–1.20)
GFR: 63.68 mL/min (ref >60.00)
Glucose, Bld: 90 mg/dL (ref 70–99)
Potassium: 4.1 mEq/L (ref 3.5–5.1)
Sodium: 138 mEq/L (ref 135–145)

## 2020-01-17 LAB — TSH: TSH: 2.01 u[IU]/mL (ref 0.35–4.50)

## 2020-01-17 LAB — HEPATIC FUNCTION PANEL
ALT: 19 U/L (ref 0–35)
AST: 19 U/L (ref 0–37)
Albumin: 4.4 g/dL (ref 3.5–5.2)
Alkaline Phosphatase: 136 U/L — ABNORMAL HIGH (ref 39–117)
Bilirubin, Direct: 0.1 mg/dL (ref 0.0–0.3)
Total Bilirubin: 0.6 mg/dL (ref 0.2–1.2)
Total Protein: 6.3 g/dL (ref 6.0–8.3)

## 2020-01-17 NOTE — Progress Notes (Addendum)
Patient ID: Wende NeighborsCynthia H Billard, female   DOB: Aug 05, 1955, 64 y.o.   MRN: [id number]   Subjective:    Patient ID: Wende NeighborsCynthia H Barritt, female    DOB: Aug 05, 1955, 64 y.o.   MRN: [id number]  HPI This visit occurred during the SARS-CoV-2 public health emergency.  Safety protocols were in place, including screening questions prior to the visit, additional usage of staff PPE, and extensive cleaning of exam room while observing appropriate contact time as indicated for disinfecting solutions.  Patient here for a scheduled follow up.  She reports she is doing relatively well.  Is s/p gallbladder surgery - Dr Lemar LivingsByrnett.  Recently saw GI for evaluation of elevated alkaline phosphatase.  No extrahepatic biliary dilatations on ultrasound or CT scan.   Recommended further w/up with labs and MRI.  Placed on pepcid.  pepcid helped some.  Still with intermittent nausea.  No known triggers.  No chest pain or sob reported.  Breathing stable.  No increased cough or congestion.  Recently saw cardiology.  Had calcium score - 0.    Past Medical History:  Diagnosis Date  . Allergy   . Arthritis   . Asthma   . Chicken pox   . Complication of anesthesia   . GERD (gastroesophageal reflux disease)   . History of kidney stones   . Migraine headache   . PONV (postoperative nausea and vomiting)   . Urine incontinence    Past Surgical History:  Procedure Laterality Date  . BREAST CYST ASPIRATION Bilateral    neg  . carpal tunnel sugery     bilateral  . CHOLECYSTECTOMY N/A 11/04/2019   Procedure: LAPAROSCOPIC CHOLECYSTECTOMY WITH INTRAOPERATIVE CHOLANGIOGRAM;  Surgeon: Earline MayotteByrnett, Jeffrey W, MD;  Location: [facility] ORS;  Service: General;  Laterality: N/A;  . COLONOSCOPY WITH PROPOFOL N/A 09/20/2018   Procedure: COLONOSCOPY WITH PROPOFOL;  Surgeon: Scot JunElliott, Robert T, MD;  [facility];  Service: Endoscopy;  Laterality: N/A;  . ELBOW SURGERY  1999  . ESOPHAGOGASTRODUODENOSCOPY (EGD) WITH PROPOFOL N/A 09/20/2018    Procedure: ESOPHAGOGASTRODUODENOSCOPY (EGD) WITH PROPOFOL;  Surgeon: Scot JunElliott, Robert T, MD;  [facility];  Service: Endoscopy;  Laterality: N/A;  . HAND SURGERY  1999   broken finger - pins  . NASAL SINUS SURGERY  1992  . ROTATOR CUFF REPAIR  1994  . Trigger finger surgery  1999 and 2000  . VAGINAL HYSTERECTOMY  1987   secondary to fibroids   Family History  Problem Relation Age of Onset  . Colon cancer Mother        colon  . Asthma Mother   . Heart disease Father   . Hypertension Father   . Colon cancer Sister   . Stroke Maternal Grandfather   . Diabetes Maternal Grandfather   . Lung cancer Maternal Grandmother   . Breast cancer Maternal Grandmother   . Esophageal cancer Neg Hx   . Pancreatic cancer Neg Hx   . Liver disease Neg Hx   . Stomach cancer Neg Hx    Social History   Socioeconomic History  . Marital status: Married    Spouse name: Not on file  . Number of children: Not on file  . Years of education: Not on file  . Highest education level: Not on file  Occupational History  . Not on file  Tobacco Use  . Smoking status: Never Smoker  . Smokeless tobacco: Never Used  Vaping Use  . Vaping Use: Never used  Substance and Sexual Activity  . Alcohol use:  No  . Drug use: No  . Sexual activity: Yes    Birth control/protection: None, Post-menopausal  Other Topics Concern  . Not on file  Social History Narrative  . Not on file   Social Determinants of Health   Financial Resource Strain:   . Difficulty of Paying Living Expenses: Not on file  Food Insecurity:   . Worried About Programme researcher, broadcasting/film/video in the Last Year: Not on file  . Ran Out of Food in the Last Year: Not on file  Transportation Needs:   . Lack of Transportation (Medical): Not on file  . Lack of Transportation (Non-Medical): Not on file  Physical Activity:   . Days of Exercise per Week: Not on file  . Minutes of Exercise per Session: Not on file  Stress:   . Feeling of Stress : Not  on file  Social Connections:   . Frequency of Communication with Friends and Family: Not on file  . Frequency of Social Gatherings with Friends and Family: Not on file  . Attends Religious Services: Not on file  . Active Member of Clubs or Organizations: Not on file  . Attends Banker Meetings: Not on file  . Marital Status: Not on file    Outpatient Encounter Medications as of 01/17/2020  Medication Sig  . acetaminophen (TYLENOL) 500 MG tablet Take 1,000 mg by mouth daily as needed for mild pain or headache.   . albuterol (PROVENTIL HFA;VENTOLIN HFA) 108 (90 Base) MCG/ACT inhaler Inhale 2 puffs into the lungs every 6 (six) hours as needed for wheezing or shortness of breath.  . Calcium Carbonate-Vitamin D3 600-400 MG-UNIT TABS Take 600 Units by mouth daily.   . cetirizine (ZYRTEC) 10 MG tablet Take 10 mg by mouth daily.  Tery Sanfilippo Calcium (STOOL SOFTENER PO) Take 1 tablet by mouth daily.   . famotidine (PEPCID) 20 MG tablet Take 1 tablet (20 mg total) by mouth 2 (two) times daily.  Marland Kitchen imipramine (TOFRANIL) 25 MG tablet Take 1 tablet (25 mg total) by mouth 2 (two) times daily.  . pantoprazole (PROTONIX) 40 MG tablet TAKE 1 TABLET BY MOUTH TWICE A DAY BEFORE A MEAL (Patient taking differently: Take 40 mg by mouth 2 (two) times daily. )  . tolterodine (DETROL LA) 4 MG 24 hr capsule Take 1 capsule (4 mg total) by mouth daily.  Marland Kitchen topiramate (TOPAMAX) 25 MG tablet TAKE 1 TABLET BY MOUTH TWICE A DAY (Patient taking differently: Take 25 mg by mouth 2 (two) times daily. )  . fluticasone (FLOVENT HFA) 110 MCG/ACT inhaler Inhale 2 puffs into the lungs 2 (two) times daily.   No facility-administered encounter medications on file as of 01/17/2020.    Review of Systems  Constitutional: Negative for appetite change and unexpected weight change.  HENT: Negative for congestion and sinus pressure.   Respiratory: Negative for cough, chest tightness and shortness of breath.   Cardiovascular:  Negative for chest pain, palpitations and leg swelling.  Gastrointestinal: Positive for nausea. Negative for abdominal pain, diarrhea and vomiting.  Genitourinary: Negative for difficulty urinating and dysuria.  Musculoskeletal: Negative for joint swelling and myalgias.  Skin: Negative for color change and rash.  Neurological: Negative for dizziness, light-headedness and headaches.  Psychiatric/Behavioral: Negative for agitation and dysphoric mood.       Objective:    Physical Exam Vitals reviewed.  Constitutional:      General: She is not in acute distress.    Appearance: Normal appearance.  HENT:  Head: Normocephalic and atraumatic.  Eyes:     General: No scleral icterus.       Right eye: No discharge.        Left eye: No discharge.     Conjunctiva/sclera: Conjunctivae normal.  Neck:     Thyroid: No thyromegaly.  Cardiovascular:     Rate and Rhythm: Normal rate and regular rhythm.  Pulmonary:     Effort: No respiratory distress.     Breath sounds: Normal breath sounds. No wheezing.  Abdominal:     General: Bowel sounds are normal.     Palpations: Abdomen is soft.     Tenderness: There is no abdominal tenderness.  Musculoskeletal:        General: No swelling or tenderness.     Cervical back: Neck supple. No tenderness.  Lymphadenopathy:     Cervical: No cervical adenopathy.  Skin:    Findings: No erythema or rash.  Neurological:     Mental Status: She is alert.  Psychiatric:        Mood and Affect: Mood normal.        Behavior: Behavior normal.     BP 118/84   Pulse 80   Temp 98.5 F (36.9 C) (Oral)   Ht 5\' 3"  (1.6 m)   Wt 181 lb (82.1 kg)   SpO2 99%   BMI 32.06 kg/m  Wt Readings from Last 3 Encounters:  01/17/20 181 lb (82.1 kg)  01/11/20 176 lb (79.8 kg)  11/10/19 176 lb 8 oz (80.1 kg)     Lab Results  Component Value Date   WBC 4.1 10/03/2019   HGB 13.6 10/03/2019   HCT 40.6 10/03/2019   PLT 211.0 10/03/2019   GLUCOSE 90 01/17/2020    CHOL 204 (H) 01/17/2020   TRIG 59.0 01/17/2020   HDL 98.50 01/17/2020   LDLCALC 93 01/17/2020   ALT 19 01/17/2020   AST 19 01/17/2020   NA 138 01/17/2020   K 4.1 01/17/2020   CL 105 01/17/2020   CREATININE 0.94 01/17/2020   BUN 10 01/17/2020   CO2 25 01/17/2020   TSH 2.01 01/17/2020   HGBA1C 5.4 10/03/2019    CT CARDIAC SCORING  Addendum Date: 01/01/2020   ADDENDUM REPORT: 01/01/2020 16:43 CLINICAL DATA:  Risk stratification EXAM: Coronary Calcium Score TECHNIQUE: The patient was scanned on a 03/02/2020 scanner. Axial non-contrast 3 mm slices were carried out through the heart. The data set was analyzed on a dedicated work station and scored using the Agatson method. FINDINGS: Non-cardiac: See separate report from [facility]. Ascending Aorta: Normal size, minimal calcifications. Pericardium: Normal. Coronary arteries: Normal origin. IMPRESSION: Coronary calcium score of 0. This was 0 percentile for age and sex matched control. Electronically Signed   By: [facility]   On: 01/01/2020 16:43   Result Date: 01/01/2020 EXAM: OVER-READ INTERPRETATION  CT CHEST The following report is an over-read performed by radiologist Dr. 03/02/2020 of [facility] on 12/30/2019. This over-read does not include interpretation of cardiac or coronary anatomy or pathology. The calcium score interpretation by the cardiologist is attached. COMPARISON:  Chest radiograph 03/10/2017. FINDINGS: Vascular: Aortic atherosclerosis.  Tortuous thoracic aorta. Mediastinum/Nodes: No imaged thoracic adenopathy. Tiny hiatal hernia. Esophageal fluid level on 16/4. Lungs/Pleura: No pleural fluid.  Clear imaged lungs. Upper Abdomen: Normal imaged portions of the liver, spleen. Musculoskeletal: No acute osseous abnormality. IMPRESSION: 1. No acute process in the imaged extracardiac chest. 2. Tiny hiatal hernia. Esophageal air fluid level suggests dysmotility or  gastroesophageal reflux. 3. Aortic  Atherosclerosis (ICD10-I70.0). Electronically Signed: By: Jeronimo Greaves M.D. On: 12/30/2019 13:14       Assessment & Plan:   Problem List Items Addressed This Visit    Hypercholesteremia    Low cholesterol diet and exercise.  Follow lipid panel.       Relevant Orders   Hepatic function panel (Completed)   Lipid panel (Completed)   TSH (Completed)   Basic metabolic panel (Completed)   Hepatic function panel   Lipid panel   Basic metabolic panel   GERD (gastroesophageal reflux disease)    Continue protonix/pepcid. Continue f/u with GI.       Elevated alkaline phosphatase level    Seeing GI.  W/up in progress.  Intermittent nausea.  Follow. Continue pepcid  - helps.  Continue f/u with GI for w/up.        Asthma    Breathing stable.  No increased cough and congestion.  Follow.  Continue flovent.       Relevant Orders   Basic metabolic panel    Other Visit Diagnoses    Needs flu shot    -  Primary   Relevant Orders   Flu Vaccine QUAD High Dose(Fluad) (Completed)       Dale Wallace, MD

## 2020-01-20 LAB — INSULIN, FREE AND TOTAL
Free Insulin: 5.6 uU/mL
Total Insulin: 5.6 uU/mL

## 2020-01-22 ENCOUNTER — Encounter: Payer: Self-pay | Admitting: Internal Medicine

## 2020-01-22 NOTE — Assessment & Plan Note (Signed)
Continue protonix/pepcid. Continue f/u with GI.

## 2020-01-22 NOTE — Assessment & Plan Note (Addendum)
Breathing stable.  No increased cough and congestion.  Follow.  Continue flovent.

## 2020-01-22 NOTE — Assessment & Plan Note (Signed)
Seeing GI.  W/up in progress.  Intermittent nausea.  Follow. Continue pepcid  - helps.  Continue f/u with GI for w/up.

## 2020-01-22 NOTE — Assessment & Plan Note (Signed)
Low cholesterol diet and exercise.  Follow lipid panel.   

## 2020-01-23 ENCOUNTER — Other Ambulatory Visit: Payer: Self-pay | Admitting: Internal Medicine

## 2020-01-23 DIAGNOSIS — J4520 Mild intermittent asthma, uncomplicated: Secondary | ICD-10-CM

## 2020-01-25 ENCOUNTER — Other Ambulatory Visit: Payer: Self-pay

## 2020-01-25 DIAGNOSIS — R748 Abnormal levels of other serum enzymes: Secondary | ICD-10-CM

## 2020-01-25 DIAGNOSIS — R112 Nausea with vomiting, unspecified: Secondary | ICD-10-CM

## 2020-01-30 ENCOUNTER — Ambulatory Visit
Admission: RE | Admit: 2020-01-30 | Discharge: 2020-01-30 | Disposition: A | Discharge status: Home / Self Care | Payer: Medicare HMO | Source: Ambulatory Visit | Attending: Gastroenterology | Admitting: Gastroenterology

## 2020-01-30 ENCOUNTER — Other Ambulatory Visit: Payer: Medicare HMO

## 2020-01-30 ENCOUNTER — Other Ambulatory Visit: Payer: Self-pay

## 2020-01-30 DIAGNOSIS — R112 Nausea with vomiting, unspecified: Secondary | ICD-10-CM | POA: Insufficient documentation

## 2020-01-30 DIAGNOSIS — R109 Unspecified abdominal pain: Secondary | ICD-10-CM

## 2020-01-30 DIAGNOSIS — R748 Abnormal levels of other serum enzymes: Secondary | ICD-10-CM | POA: Diagnosis present

## 2020-01-30 DIAGNOSIS — K8050 Calculus of bile duct without cholangitis or cholecystitis without obstruction: Secondary | ICD-10-CM | POA: Diagnosis not present

## 2020-01-30 MED ORDER — GADOBUTROL 1 MMOL/ML IV SOLN
7.5000 mL | Freq: Once | INTRAVENOUS | Status: AC | PRN
  Administered 2020-01-30: 7.5 mL via INTRAVENOUS

## 2020-01-31 DIAGNOSIS — R748 Abnormal levels of other serum enzymes: Secondary | ICD-10-CM | POA: Diagnosis not present

## 2020-01-31 DIAGNOSIS — R109 Unspecified abdominal pain: Secondary | ICD-10-CM | POA: Diagnosis not present

## 2020-02-01 ENCOUNTER — Telehealth: Payer: Self-pay | Admitting: Internal Medicine

## 2020-02-01 DIAGNOSIS — J4520 Mild intermittent asthma, uncomplicated: Secondary | ICD-10-CM

## 2020-02-01 MED ORDER — FLUTICASONE PROPIONATE HFA 110 MCG/ACT IN AERO: 2.0000 | INHALATION_SPRAY | Freq: Two times a day (BID) | RESPIRATORY_TRACT | 0 refills | Status: DC

## 2020-02-01 NOTE — Telephone Encounter (Signed)
One month supply of flovent has been sent to preferred pharmacy to get her by until her f/u on 02/14/20. Patient is aware and voiced her understanding. Nothing further needed.

## 2020-02-01 NOTE — Telephone Encounter (Signed)
Lm for patient.  

## 2020-02-01 NOTE — Telephone Encounter (Signed)
Patient is returning phone call. Patient phone number is [phone].

## 2020-02-06 LAB — PANCREATIC ELASTASE, FECAL: Pancreatic Elastase-1, Stool: 398 mcg/g

## 2020-02-14 ENCOUNTER — Ambulatory Visit
Admission: RE | Admit: 2020-02-14 | Discharge: 2020-02-14 | Disposition: A | Discharge status: Home / Self Care | Payer: Medicare HMO | Source: Ambulatory Visit | Attending: Pulmonary Disease | Admitting: Pulmonary Disease

## 2020-02-14 ENCOUNTER — Other Ambulatory Visit: Payer: Self-pay

## 2020-02-14 ENCOUNTER — Ambulatory Visit: Payer: Medicare HMO | Admitting: Pulmonary Disease

## 2020-02-14 ENCOUNTER — Encounter: Payer: Self-pay | Admitting: Pulmonary Disease

## 2020-02-14 VITALS — BP 124/76 | HR 90 | Temp 97.1°F | Ht 63.0 in | Wt 179.8 lb

## 2020-02-14 DIAGNOSIS — Z0000 Encounter for general adult medical examination without abnormal findings: Secondary | ICD-10-CM

## 2020-02-14 DIAGNOSIS — K219 Gastro-esophageal reflux disease without esophagitis: Secondary | ICD-10-CM

## 2020-02-14 DIAGNOSIS — J4520 Mild intermittent asthma, uncomplicated: Secondary | ICD-10-CM | POA: Diagnosis not present

## 2020-02-14 DIAGNOSIS — U071 COVID-19: Secondary | ICD-10-CM | POA: Diagnosis not present

## 2020-02-14 DIAGNOSIS — Z8616 Personal history of COVID-19: Secondary | ICD-10-CM | POA: Diagnosis not present

## 2020-02-14 MED ORDER — BENZONATATE 200 MG PO CAPS: 200.0000 mg | ORAL_CAPSULE | Freq: Three times a day (TID) | ORAL | 1 refills | Status: DC | PRN

## 2020-02-14 NOTE — Assessment & Plan Note (Signed)
Plan: Continue Protonix Continue Pepcid Continue follow-up with gastroenterology Follow GERD lifestyle recommendations

## 2020-02-14 NOTE — Patient Instructions (Addendum)
You were seen today by Coral Ceo, NP  for:   1. Mild intermittent asthma without complication  Continue your Flovent inhaler  Only use your albuterol as a rescue medication to be used if you can't catch your breath by resting or doing a relaxed purse lip breathing pattern.  - The less you use it, the better it will work when you need it. - Ok to use up to 2 puffs  every 4 hours if you must but call for immediate appointment if use goes up over your usual need - Don't leave home without it !!  (think of it like the spare tire for your car)    2. History of COVID-19  Chest x-ray today  We recommend you obtain your COVID-19 booster  3. Gastroesophageal reflux disease, unspecified whether esophagitis present  Protonix 40 mg tablet  >>>Please take 1 tablet daily 15 minutes to 30 minutes before your first meal of the day and your last meal as well as before your other medications >>>Try to take at the same time each day >>>take this medication daily  Continue Pepcid  Continue follow-up with gastroenterology  GERD management: >>>Avoid laying flat until 2 hours after meals >>>Elevate head of the bed including entire chest >>>Reduce size of meals and amount of fat, acid, spices, caffeine and sweets >>>If you are smoking, Please stop! >>>Decrease alcohol consumption >>>Work on maintaining a healthy weight with normal BMI   4. Health care maintenance  Obtain your COVID-19 booster   Follow Up:    Return in about 1 year (around 02/13/2021), or if symptoms worsen or fail to improve, for [facility] - Dr. Belia Heman.   Notification of test results are managed in the following manner: If there are  any recommendations or changes to the  plan of care discussed in office today,  we will contact you and let you know what they are. If you do not hear from [place], then your results are normal and you can view them through your  MyChart account , or a letter will be sent to you. Thank you  again for trusting [place] with your care  - Thank you, [facility]    It is flu season:   >>> Best ways to protect herself from the flu: Receive the yearly flu vaccine, practice good hand hygiene washing with soap and also using hand sanitizer when available, eat a nutritious meals, get adequate rest, hydrate appropriately       Please contact the office if your symptoms worsen or you have concerns that you are not improving.   Thank you for choosing [facility] for your healthcare, and for allowing [place] to partner with you on your healthcare journey. I am thankful to be able to provide care to you today.   Elisha Headland FNP-C

## 2020-02-14 NOTE — Assessment & Plan Note (Signed)
Plan: °Obtain COVID-19 booster °

## 2020-02-14 NOTE — Assessment & Plan Note (Signed)
Plan: Chest x-ray today >>> If chest x-ray shows any concerns for COVID-19 ILD, may need to obtain CT imaging and/or pulmonary function testing Continue Flovent Continue rescue inhaler Follow-up with our office in 1 year

## 2020-02-14 NOTE — Assessment & Plan Note (Signed)
History of COVID-19 in January/2021 Did not require hospitalization Did not receive monoclonal antibody infusion  Plan: Chest x-ray today Obtain COVID-19 booster If chest x-ray shows any aspects of COVID-19 ILD may need to consider CT imaging and/or pulmonary function testing

## 2020-02-14 NOTE — Progress Notes (Signed)
@Patient  ID: , female    DOB: 1955-07-21, 64 y.o.   MRN: [id number]  Chief Complaint  Patient presents with  . Follow-up    c/o sob with exertion, dry cough and occ wheezing.    Referring provider: [id number], MD  HPI:  64 year old female never smoker found our office for asthma  PMH: Migraines, hyper cholesteremia, GERD, fatigue Smoker/ Smoking History: Never smoker Maintenance: Flovent 110 Pt of: [facility]  02/14/2020  - Visit   64 year old female never smoker found our office for mild intermittent asthma.  She is established with [facility].  She is presenting today as a annual office visit to receive refills of her inhalers.  She remains on Flovent 110 HFA as well as a rescue inhaler.  She only has to use her rescue inhaler about 1 time a month.  Since last being seen patient was diagnosed with COVID-19 in January/2021.  She was treated as an outpatient.  She did not receive the monoclonal antibody infusion.  She did not require hospitalization.  She does report that she still has occasional long Covid symptoms such as dry cough as well as occasional shortness of breath and fatigue.   Tests:   FENO:  No results found for: NITRICOXIDE  PFT: PFT Results Latest Ref Rng & Units 12/11/2015  FVC-Pre L 2.81  FVC-Predicted Pre % 89  FVC-Post L 2.56  FVC-Predicted Post % 81  Pre FEV1/FVC % % 80  Post FEV1/FCV % % 85  FEV1-Pre L 2.24  FEV1-Predicted Pre % 92  FEV1-Post L 2.17  DLCO uncorrected ml/min/mmHg 32.61  DLCO UNC% % 142  DLVA Predicted % 70  TLC L 5.02  TLC % Predicted % 102  RV % Predicted % 106    WALK:  SIX MIN WALK 12/11/2015  Medications Zyrtec, Dexilant, Cardizem, Arnuity, Metoprol, Topamax, Myrbetriq  Supplimental Oxygen during Test? (L/min) No  Laps 7  Partial Lap (in Meters) 42  Baseline BP (sitting) 120/74  Baseline Heartrate 60  Baseline Dyspnea (Borg Scale) 3  Baseline Fatigue (Borg Scale) 3  Baseline SPO2 100  BP (sitting) 140/90    Heartrate 78  Dyspnea (Borg Scale) 4  Fatigue (Borg Scale) 5  SPO2 99  BP (sitting) 110/70  Heartrate 71  SPO2 100  Stopped or Paused before Six Minutes No  Interpretation Dizziness  Distance Completed 378  Tech Comments: pt walked at moderate pace. had some Chest tightness and dizziness when turning.    Imaging: MR ABDOMEN MRCP W WO CONTAST  Result Date: 01/30/2020 CLINICAL DATA:  Nausea, epigastric pain and elevated alkaline phosphatase level. EXAM: MRI ABDOMEN WITHOUT AND WITH CONTRAST (INCLUDING MRCP) TECHNIQUE: Multiplanar multisequence MR imaging of the abdomen was performed both before and after the administration of intravenous contrast. Heavily T2-weighted images of the biliary and pancreatic ducts were obtained, and three-dimensional MRCP images were rendered by post processing. CONTRAST:  7.46mL GADAVIST GADOBUTROL 1 MMOL/ML IV SOLN COMPARISON:  CT scan 07/26/2018 FINDINGS: Lower chest: The lung bases are clear of an acute process. The heart is normal in size. No pericardial effusion. Hepatobiliary: No hepatic lesions or intrahepatic biliary dilatation. The gallbladder is surgically absent. Normal caliber and course of the common bile duct. Low lying cystic duct remnant. No common bile duct stones. Pancreas: Moderate pancreatic atrophy. No mass, inflammation or ductal dilatation. Spleen:  Normal size. No focal lesions. Adrenals/Urinary Tract: The adrenal glands are unremarkable. Small bilateral renal cysts but no worrisome renal lesions. Stomach/Bowel: The stomach, duodenum, small  bowel and colon are unremarkable. No acute inflammatory changes. Vascular/Lymphatic: The aorta is normal in caliber. The branch vessels are patent. The major venous structures are patent. No mesenteric or retroperitoneal mass or adenopathy. Other:  No ascites or abdominal wall hernia. Musculoskeletal: No significant bony findings. IMPRESSION: 1. Status post cholecystectomy. No biliary dilatation or common bile  duct stones. 2. Moderate pancreatic atrophy. 3. No acute abdominal findings, mass lesions or adenopathy. Electronically Signed   By: Rudie Meyer M.D.   On: 01/30/2020 11:43    Lab Results:  CBC    Component Value Date/Time   WBC 4.1 10/03/2019 0920   RBC 4.65 10/03/2019 0920   HGB 13.6 10/03/2019 0920   HGB 13.0 09/21/2013 0506   HCT 40.6 10/03/2019 0920   HCT 37.8 09/21/2013 0506   PLT 211.0 10/03/2019 0920   PLT 211 09/21/2013 0506   MCV 87.3 10/03/2019 0920   MCV 86 09/21/2013 0506   MCH 29.6 03/20/2017 1533   MCHC 33.5 10/03/2019 0920   RDW 14.3 10/03/2019 0920   RDW 13.4 09/21/2013 0506   LYMPHSABS 1.3 10/03/2019 0920   LYMPHSABS 1.3 09/21/2013 0506   MONOABS 0.3 10/03/2019 0920   MONOABS 0.5 09/21/2013 0506   EOSABS 0.4 10/03/2019 0920   EOSABS 0.2 09/21/2013 0506   BASOSABS 0.0 10/03/2019 0920   BASOSABS 0.0 09/21/2013 0506    BMET    Component Value Date/Time   NA 138 01/17/2020 1137   NA 142 09/21/2013 0506   K 4.1 01/17/2020 1137   K 3.4 (L) 09/21/2013 0506   CL 105 01/17/2020 1137   CL 110 (H) 09/21/2013 0506   CO2 25 01/17/2020 1137   CO2 21 09/21/2013 0506   GLUCOSE 90 01/17/2020 1137   GLUCOSE 100 (H) 09/21/2013 0506   BUN 10 01/17/2020 1137   BUN 4 (L) 09/21/2013 0506   CREATININE 0.94 01/17/2020 1137   CREATININE 0.82 09/21/2013 0506   CALCIUM 9.3 01/17/2020 1137   CALCIUM 8.2 (L) 09/21/2013 0506   GFRNONAA >60 03/20/2017 1533   GFRNONAA >60 09/21/2013 0506   GFRAA >60 03/20/2017 1533   GFRAA >60 09/21/2013 0506    BNP No results found for: BNP  ProBNP No results found for: PROBNP  Specialty Problems      Pulmonary Problems   Cough   Asthma    Pulmonary function test 12/11/2015 FEV1 92% FEV1/FVC 80% RV 106% TLC 102% DLCO corrected and her 42% Impression: Nonobstructive process by spirometry, no significant response to bronchodilators. Mild scooping of end expiratory curve, consistent with a mild obstructive process. Overall  fits the clinical diagnosis of asthma.  6 minute walk test total distance 1240 feet/378 m low saturation 99%, highest heart rate 78      Dyspnea   Sinusitis      Allergies  Allergen Reactions  . Demerol [Meperidine] Nausea Only    Immunization History  Administered Date(s) Administered  . Fluad Quad(high Dose 65+) 01/17/2020  . Influenza Split 12/18/2015  . Influenza,inj,Quad PF,6+ Mos 01/08/2017, 01/01/2018  . Influenza-Unspecified 12/17/2012, 01/05/2014, 01/08/2015, 01/01/2018, 12/23/2018  . PFIZER SARS-COV-2 Vaccination 07/14/2019, 08/09/2019  . Pneumococcal Conjugate-13 09/19/2019  . Pneumococcal Polysaccharide-23 08/19/2016  . Zoster Recombinat (Shingrix) 02/09/2019, 05/10/2019    Past Medical History:  Diagnosis Date  . Allergy   . Arthritis   . Asthma   . Chicken pox   . Complication of anesthesia   . GERD (gastroesophageal reflux disease)   . History of kidney stones   . Migraine  headache   . PONV (postoperative nausea and vomiting)   . Urine incontinence     Tobacco History: Social History   Tobacco Use  Smoking Status Never Smoker  Smokeless Tobacco Never Used   Counseling given: Not Answered   Continue to not smoke  Outpatient Encounter Medications as of 02/14/2020  Medication Sig  . acetaminophen (TYLENOL) 500 MG tablet Take 1,000 mg by mouth daily as needed for mild pain or headache.   . albuterol (PROVENTIL HFA;VENTOLIN HFA) 108 (90 Base) MCG/ACT inhaler Inhale 2 puffs into the lungs every 6 (six) hours as needed for wheezing or shortness of breath.  Tery Sanfilippo Calcium (STOOL SOFTENER PO) Take 1 tablet by mouth daily.   . famotidine (PEPCID) 20 MG tablet Take 1 tablet (20 mg total) by mouth 2 (two) times daily.  . fluticasone (FLOVENT HFA) 110 MCG/ACT inhaler Inhale 2 puffs into the lungs 2 (two) times daily.  Marland Kitchen imipramine (TOFRANIL) 25 MG tablet Take 1 tablet (25 mg total) by mouth 2 (two) times daily.  . pantoprazole (PROTONIX) 40 MG tablet  TAKE 1 TABLET BY MOUTH TWICE A DAY BEFORE A MEAL  . tolterodine (DETROL LA) 4 MG 24 hr capsule Take 1 capsule (4 mg total) by mouth daily.  Marland Kitchen topiramate (TOPAMAX) 25 MG tablet TAKE 1 TABLET BY MOUTH TWICE A DAY (Patient taking differently: Take 25 mg by mouth 2 (two) times daily. )  . [DISCONTINUED] Calcium Carbonate-Vitamin D3 600-400 MG-UNIT TABS Take 600 Units by mouth daily.   . [DISCONTINUED] cetirizine (ZYRTEC) 10 MG tablet Take 10 mg by mouth daily.  . benzonatate (TESSALON) 200 MG capsule Take 1 capsule (200 mg total) by mouth 3 (three) times daily as needed for cough.   No facility-administered encounter medications on file as of 02/14/2020.     Review of Systems  Review of Systems  Constitutional: Positive for fatigue. Negative for activity change and fever.  HENT: Negative for sinus pressure, sinus pain and sore throat.   Respiratory: Positive for cough, shortness of breath and wheezing.   Cardiovascular: Negative for chest pain and palpitations.  Gastrointestinal: Negative for diarrhea, nausea and vomiting.  Musculoskeletal: Negative for arthralgias.  Neurological: Negative for dizziness.  Psychiatric/Behavioral: Negative for sleep disturbance. The patient is not nervous/anxious.      Physical Exam  BP 124/76 (BP Location: Left Arm, Cuff Size: Normal)   Pulse 90   Temp (!) 97.1 F (36.2 C) (Temporal)   Ht 5\' 3"  (1.6 m)   Wt 179 lb 12.8 oz (81.6 kg)   SpO2 100%   BMI 31.85 kg/m   Wt Readings from Last 5 Encounters:  02/14/20 179 lb 12.8 oz (81.6 kg)  01/17/20 181 lb (82.1 kg)  01/11/20 176 lb (79.8 kg)  11/10/19 176 lb 8 oz (80.1 kg)  10/26/19 175 lb (79.4 kg)    BMI Readings from Last 5 Encounters:  02/14/20 31.85 kg/m  01/17/20 32.06 kg/m  01/11/20 31.18 kg/m  11/10/19 31.27 kg/m  10/26/19 31.00 kg/m     Physical Exam Vitals and nursing note reviewed.  Constitutional:      General: She is not in acute distress.    Appearance: Normal  appearance. She is obese.  HENT:     Head: Normocephalic and atraumatic.     Right Ear: External ear normal.     Left Ear: External ear normal.     Nose: Nose normal. No congestion.     Mouth/Throat:     Mouth: Mucous membranes  are moist.     Pharynx: Oropharynx is clear.  Eyes:     Pupils: Pupils are equal, round, and reactive to light.  Cardiovascular:     Rate and Rhythm: Normal rate and regular rhythm.     Pulses: Normal pulses.     Heart sounds: Normal heart sounds. No murmur heard.   Pulmonary:     Effort: Pulmonary effort is normal. No respiratory distress.     Breath sounds: Normal breath sounds. No decreased air movement. No decreased breath sounds, wheezing or rales.  Musculoskeletal:     Cervical back: Normal range of motion.  Skin:    General: Skin is warm and dry.     Capillary Refill: Capillary refill takes less than 2 seconds.  Neurological:     General: No focal deficit present.     Mental Status: She is alert and oriented to person, place, and time. Mental status is at baseline.     Gait: Gait normal.  Psychiatric:        Mood and Affect: Mood normal.        Behavior: Behavior normal.        Thought Content: Thought content normal.        Judgment: Judgment normal.       Assessment & Plan:   Asthma Plan: Chest x-ray today >>> If chest x-ray shows any concerns for COVID-19 ILD, may need to obtain CT imaging and/or pulmonary function testing Continue Flovent Continue rescue inhaler Follow-up with our office in 1 year  GERD (gastroesophageal reflux disease) Plan: Continue Protonix Continue Pepcid Continue follow-up with gastroenterology Follow GERD lifestyle recommendations  Health care maintenance Plan: Obtain COVID-19 booster  History of COVID-19 History of COVID-19 in January/2021 Did not require hospitalization Did not receive monoclonal antibody infusion  Plan: Chest x-ray today Obtain COVID-19 booster If chest x-ray shows any  aspects of COVID-19 ILD may need to consider CT imaging and/or pulmonary function testing    Return in about 1 year (around 02/13/2021), or if symptoms worsen or fail to improve, [facility] Belia HemanKasa.   Coral CeoBrian P Omauri Boeve, NP 02/14/2020   This appointment required 32 minutes of patient care (this includes precharting, chart review, review of results, face-to-face care, etc.).

## 2020-03-06 ENCOUNTER — Encounter: Payer: Self-pay | Admitting: Gastroenterology

## 2020-03-06 ENCOUNTER — Other Ambulatory Visit (INDEPENDENT_AMBULATORY_CARE_PROVIDER_SITE_OTHER): Payer: Medicare HMO

## 2020-03-06 ENCOUNTER — Ambulatory Visit: Payer: Medicare HMO | Admitting: Gastroenterology

## 2020-03-06 VITALS — BP 122/64 | HR 82 | Ht 63.0 in | Wt 178.0 lb

## 2020-03-06 DIAGNOSIS — R109 Unspecified abdominal pain: Secondary | ICD-10-CM

## 2020-03-06 DIAGNOSIS — R748 Abnormal levels of other serum enzymes: Secondary | ICD-10-CM

## 2020-03-06 DIAGNOSIS — R112 Nausea with vomiting, unspecified: Secondary | ICD-10-CM

## 2020-03-06 LAB — ALKALINE PHOSPHATASE: Alkaline Phosphatase: 132 U/L — ABNORMAL HIGH (ref 39–117)

## 2020-03-06 MED ORDER — LINACLOTIDE 72 MCG PO CAPS: 72.0000 ug | ORAL_CAPSULE | Freq: Every day | ORAL | 0 refills | Status: DC

## 2020-03-06 NOTE — Patient Instructions (Addendum)
LABS: Your provider has requested that you go to the basement level for lab work before leaving today. Press "B" on the elevator. The lab is located at the first door on the left as you exit the elevator.  HEALTHCARE LAWS AND MY CHART RESULTS: Due to recent changes in healthcare laws, you may see the results of your imaging and laboratory studies on MyChart before your provider has had a chance to review them.  We understand that in some cases there may be results that are confusing or concerning to you. Not all laboratory results come back in the same time frame and the provider may be waiting for multiple results in order to interpret others.  Please give [place] 48 hours in order for your provider to thoroughly review all the results before contacting the office for clarification of your results.   ENDOSCOPY: You have been scheduled for an endoscopy. Please follow written instructions given to you at your visit today.  INHALERS: If you use inhalers (even only as needed), please bring them with you on the day of your procedure.  If no improvements after your endoscopy, we will plan to schedule a gastric emptying study to further evaluate your symptoms.  SAMPLES: We have given you samples of the following medication to take:   Linzess 72mg  - please take 1 tablet by mouth x 3 days, then increase to 2 tablets every day  Please continue Famotidine and Pantoprazole  If you are age 38 or older, your body mass index should be between 23-30. Your Body mass index is 31.53 kg/m. If this is out of the aforementioned range listed, please consider follow up with your Primary Care Provider.  Thank you for trusting me with your gastrointestinal care!    12-29-1991, MD, MPH

## 2020-03-06 NOTE — Progress Notes (Signed)
Referring Provider: Einar Pheasant, MD Primary Care Physician:  Einar Pheasant, MD  Chief complaint:  Elevated alk phos   IMPRESSION:  Chronically elevated alk phos Nausea and RUQ/midepigastric pain - persistent despite cholecystectomy Chronic constipation Family history of colon cancer (mother at age 65, sister in her 102s)  Elevated alkaline phosphatase without extrahepatic biliary dilatation on ultrasound or CT scan. Serologic evaluation negative. No evidence for fatty liver. MRI was normal except for moderate pancreatic atrophy.   Nausea and RUQ: No evidence for biliary etiology by imaging.  EGD last year revealed a hiatal hernia but was otherwise normal. However, biopsies were not obtained.  May be related to constipation.   Family of colon cancer: Last colonoscopy 2000. Repeat colonoscopy in 2025, earlier with new symptoms.   PLAN: Continue PPI and famotidine 20 mg BID Alk phos isoenzymes Trial of Linzess 72 mcg daily, increasing to 145 mcg daily if no response EGD with biopsies Gastric emptying scan if not improving with improvement in constipation   Please see the "Patient Instructions" section for addition details about the plan.  HPI: Savannah Gomez is a 64 y.o. female referred by Dr. Nicki Reaper for abnormal liver enzymes.  Alk phos has become progressively more elevated. Initially elevated 01/2018. Other liver enzymes are normal. Alk phos has fluctuated since that time, but never more than 1.5x ULN. No improvement after cholecystectomy 11/04/19 for chronic cholecystitis, cholelithiasis.  Evaluation has included: Labs 01/12/20: IgG, IgM, ANA, SMAb, AMA, glucose, and fasting insulin for a calculated HOMA-IR of 1.3 Alk phos 136 01/17/20 Pancreatic elastase 398 01/31/20 MRI/MRCP 01/30/20: prior cholecystectomy. Mild pancreatic atrophy.    Has chronic GI symptoms.  Frequent regurgitation, nausea, and chest pain that radiates to the back. Symptoms fluctuate but nausea is  most prominent. Has constant globus not related to these symptoms. No identified triggers or relieving features. Has been on pantoprazole 40 mg BID for several years for reflux. Using Mylanta and TUMS with some relief. Symptoms not effected by eating. May be associated with constipation - which she noticed an increased in nausea and constipation last week. Prone to constipation. BM every 2-3 days. Uses stool softeners BID. No blood or mucous.   Prior abdominal imaging: Abd ultrasound 06/09/18: no findings, no stones CT abd/pelvis with contrast scan 07/26/18: diverticulosis, no acute findings EGD for RUQ pain and Colonoscopy 08/2018 with Dr. Vira Agar at [facility]: hiatal hernia, no H pylori, small hyperplastic polyp, diverticulosis, small internal hemorrhoids MRI/MRCP 01/30/20:  Status post cholecystectomy. No biliary dilatation or common bile duct stones. Moderate pancreatic atrophy.  Mom with colon cancer at age 93. Sister with colon in her 18s. No known family history of colon cancer or polyps. No family history of uterine/endometrial cancer, pancreatic cancer or gastric/stomach cancer.   Past Medical History:  Diagnosis Date   Allergy    Arthritis    Asthma    Chicken pox    Complication of anesthesia    GERD (gastroesophageal reflux disease)    History of kidney stones    Migraine headache    PONV (postoperative nausea and vomiting)    Urine incontinence     Past Surgical History:  Procedure Laterality Date   BREAST CYST ASPIRATION Bilateral    neg   carpal tunnel sugery     bilateral   CHOLECYSTECTOMY N/A 11/04/2019   Procedure: LAPAROSCOPIC CHOLECYSTECTOMY WITH INTRAOPERATIVE CHOLANGIOGRAM;  Surgeon: Robert Bellow, MD;  Location: [facility] ORS;  Service: General;  Laterality: N/A;   COLONOSCOPY WITH  PROPOFOL N/A 09/20/2018   Procedure: COLONOSCOPY WITH PROPOFOL;  Surgeon: Manya Silvas, MD;  Location: [facility] ENDOSCOPY;  Service: Endoscopy;  Laterality: N/A;    ELBOW SURGERY  1999   ESOPHAGOGASTRODUODENOSCOPY (EGD) WITH PROPOFOL N/A 09/20/2018   Procedure: ESOPHAGOGASTRODUODENOSCOPY (EGD) WITH PROPOFOL;  Surgeon: Manya Silvas, MD;  Location: [facility] ENDOSCOPY;  Service: Endoscopy;  Laterality: N/A;   HAND SURGERY  1999   broken finger - pins   NASAL SINUS SURGERY  1992   ROTATOR CUFF REPAIR  1994   Trigger finger surgery  1999 and [place]   secondary to fibroids    Current Outpatient Medications  Medication Sig Dispense Refill   acetaminophen (TYLENOL) 500 MG tablet Take 1,000 mg by mouth daily as needed for mild pain or headache.      albuterol (PROVENTIL HFA;VENTOLIN HFA) 108 (90 Base) MCG/ACT inhaler Inhale 2 puffs into the lungs every 6 (six) hours as needed for wheezing or shortness of breath. 1 Inhaler 5   benzonatate (TESSALON) 200 MG capsule Take 1 capsule (200 mg total) by mouth 3 (three) times daily as needed for cough. 30 capsule 1   Docusate Calcium (STOOL SOFTENER PO) Take 1 tablet by mouth daily.      famotidine (PEPCID) 20 MG tablet Take 1 tablet (20 mg total) by mouth 2 (two) times daily. 60 tablet 3   fluticasone (FLOVENT HFA) 110 MCG/ACT inhaler Inhale 2 puffs into the lungs 2 (two) times daily. 1 g 0   imipramine (TOFRANIL) 25 MG tablet Take 1 tablet (25 mg total) by mouth 2 (two) times daily. 180 tablet 3   pantoprazole (PROTONIX) 40 MG tablet TAKE 1 TABLET BY MOUTH TWICE A DAY BEFORE A MEAL 180 tablet 1   tolterodine (DETROL LA) 4 MG 24 hr capsule Take 1 capsule (4 mg total) by mouth daily. 90 capsule 3   topiramate (TOPAMAX) 25 MG tablet TAKE 1 TABLET BY MOUTH TWICE A DAY (Patient taking differently: Take 25 mg by mouth 2 (two) times daily.) 180 tablet 1   No current facility-administered medications for this visit.    Allergies as of 03/06/2020 - Review Complete 03/06/2020  Allergen Reaction Noted   Demerol [meperidine] Nausea Only 01/16/2012    Family History  Problem  Relation Age of Onset   Colon cancer Mother        colon   Asthma Mother    Heart disease Father    Hypertension Father    Colon cancer Sister    Stroke Maternal Grandfather    Diabetes Maternal Grandfather    Lung cancer Maternal Grandmother    Breast cancer Maternal Grandmother    Esophageal cancer Neg Hx    Pancreatic cancer Neg Hx    Liver disease Neg Hx    Stomach cancer Neg Hx     Social History   Socioeconomic History   Marital status: Married    Spouse name: Not on file   Number of children: Not on file   Years of education: Not on file   Highest education level: Not on file  Occupational History   Not on file  Tobacco Use   Smoking status: Never Smoker   Smokeless tobacco: Never Used  Vaping Use   Vaping Use: Never used  Substance and Sexual Activity   Alcohol use: No   Drug use: No   Sexual activity: Yes    Birth control/protection: None, Post-menopausal  Other Topics Concern   Not on  file  Social History Narrative   Not on file   Social Determinants of Health   Financial Resource Strain: Not on file  Food Insecurity: Not on file  Transportation Needs: Not on file  Physical Activity: Not on file  Stress: Not on file  Social Connections: Not on file  Intimate Partner Violence: Not on file     Physical Exam: General:   Alert,  well-nourished, pleasant and cooperative in NAD Head:  Normocephalic and atraumatic. Eyes:  Sclera clear, no icterus.   Conjunctiva pink. No xanthelasma.  Abdomen:  Soft, I am unable to reproduce her pain but she localizes it to the midline port scar from her cholecystectomy, nondistended, normal bowel sounds, no rebound or guarding. No hepatosplenomegaly.  Neurologic:  Alert and  oriented x4;  grossly nonfocal Skin: No rash or bruise. No spider angioma, palmar erythema, or Terry nails.  Psych:  Alert and cooperative. Normal mood and affect.     Shyana Kulakowski L. Tarri Glenn, MD, MPH 03/06/2020, 3:21  PM

## 2020-03-08 ENCOUNTER — Other Ambulatory Visit: Payer: Medicare HMO

## 2020-03-08 ENCOUNTER — Other Ambulatory Visit: Payer: Self-pay | Admitting: Gastroenterology

## 2020-03-08 DIAGNOSIS — R748 Abnormal levels of other serum enzymes: Secondary | ICD-10-CM | POA: Diagnosis not present

## 2020-03-08 NOTE — Progress Notes (Unsigned)
alkaline

## 2020-03-14 LAB — ALKALINE PHOSPHATASE ISOENZYMES
Alkaline phosphatase (APISO): 122 U/L (ref 37–153)
Bone Isoenzymes: 38 % (ref 28–66)
Intestinal Isoenzymes: 0 % — ABNORMAL LOW (ref 1–24)
Liver Isoenzymes: 62 % (ref 25–69)
Macrohepatic isoenzymes: 0 % (ref <=0)
Placental isoenzymes: 0 % (ref <=0)

## 2020-03-15 ENCOUNTER — Ambulatory Visit: Payer: Medicare HMO | Admitting: Gastroenterology

## 2020-03-21 ENCOUNTER — Telehealth: Payer: Self-pay | Admitting: Gastroenterology

## 2020-03-21 DIAGNOSIS — R109 Unspecified abdominal pain: Secondary | ICD-10-CM

## 2020-03-21 DIAGNOSIS — R748 Abnormal levels of other serum enzymes: Secondary | ICD-10-CM

## 2020-03-21 DIAGNOSIS — R112 Nausea with vomiting, unspecified: Secondary | ICD-10-CM

## 2020-03-21 MED ORDER — LINACLOTIDE 72 MCG PO CAPS: 72.0000 ug | ORAL_CAPSULE | Freq: Every day | ORAL | 0 refills | Status: DC

## 2020-03-21 NOTE — Telephone Encounter (Signed)
I spoke to [place] and she is on one Linzess daily and said it is helping. She requested a 90 day rx be sent in which I have done after confirming the pharmacy. Looks like her insurance may not cover it, I told her I am mailing her out a coupon card. I confirmed her address.

## 2020-03-28 ENCOUNTER — Encounter: Payer: Medicare HMO | Admitting: Gastroenterology

## 2020-03-30 ENCOUNTER — Encounter: Payer: Self-pay | Admitting: Gastroenterology

## 2020-04-02 ENCOUNTER — Encounter: Payer: Medicare HMO | Admitting: Gastroenterology

## 2020-04-02 DIAGNOSIS — H66002 Acute suppurative otitis media without spontaneous rupture of ear drum, left ear: Secondary | ICD-10-CM | POA: Diagnosis not present

## 2020-04-02 DIAGNOSIS — J329 Chronic sinusitis, unspecified: Secondary | ICD-10-CM | POA: Diagnosis not present

## 2020-05-01 ENCOUNTER — Other Ambulatory Visit: Payer: Self-pay

## 2020-05-01 ENCOUNTER — Encounter: Payer: Self-pay | Admitting: Gastroenterology

## 2020-05-01 ENCOUNTER — Ambulatory Visit (AMBULATORY_SURGERY_CENTER): Payer: Medicare HMO | Admitting: Gastroenterology

## 2020-05-01 ENCOUNTER — Telehealth: Payer: Self-pay

## 2020-05-01 VITALS — BP 131/67 | HR 80 | Temp 97.5°F | Resp 13 | Ht 63.0 in | Wt 178.0 lb

## 2020-05-01 DIAGNOSIS — R112 Nausea with vomiting, unspecified: Secondary | ICD-10-CM | POA: Diagnosis not present

## 2020-05-01 DIAGNOSIS — K3189 Other diseases of stomach and duodenum: Secondary | ICD-10-CM | POA: Diagnosis not present

## 2020-05-01 DIAGNOSIS — K2289 Other specified disease of esophagus: Secondary | ICD-10-CM | POA: Diagnosis not present

## 2020-05-01 DIAGNOSIS — K317 Polyp of stomach and duodenum: Secondary | ICD-10-CM | POA: Diagnosis not present

## 2020-05-01 DIAGNOSIS — K2950 Unspecified chronic gastritis without bleeding: Secondary | ICD-10-CM | POA: Diagnosis not present

## 2020-05-01 DIAGNOSIS — K2970 Gastritis, unspecified, without bleeding: Secondary | ICD-10-CM

## 2020-05-01 DIAGNOSIS — R109 Unspecified abdominal pain: Secondary | ICD-10-CM

## 2020-05-01 MED ORDER — SODIUM CHLORIDE 0.9 % IV SOLN
500.0000 mL | Freq: Once | INTRAVENOUS | Status: DC
Start: 2020-05-01 — End: 2020-06-13

## 2020-05-01 NOTE — Telephone Encounter (Signed)
Spoke with pt and she is aware of appt. And instructions.

## 2020-05-01 NOTE — Telephone Encounter (Signed)
Pt scheduled for GES at [facility] 05/28/20@7 :30am, pt to arrive in xray dept at 7:15am. Pt to be NPO after midnight and hold stomach meds after midnight.

## 2020-05-01 NOTE — Progress Notes (Signed)
Cw vitals and SM IV. 

## 2020-05-01 NOTE — Telephone Encounter (Signed)
-----   Message from Tressia Danas, MD sent at 05/01/2020 10:52 AM EST ----- Please arrange for gastric emptying scan for nausea, abdominal pain, and retained food on EGD  Thank you.  KLB

## 2020-05-01 NOTE — Op Note (Signed)
[facility] Patient Name: Savannah Gomez Procedure Date: 05/01/2020 10:42 AM MRN: [id number] Endoscopist: Tressia Danas MD, MD Age: 65 Referring MD:  Date of Birth: 1956-02-26 Gender: Female Account #: [id number] Procedure:                Upper GI endoscopy Indications:              Nausea and RUQ/midepigastric pain - persistent                            despite cholecystectomy Medicines:                Monitored Anesthesia Care Procedure:                Pre-Anesthesia Assessment:                           - Prior to the procedure, a History and Physical                            was performed, and patient medications and                            allergies were reviewed. The patient's tolerance of                            previous anesthesia was also reviewed. The risks                            and benefits of the procedure and the sedation                            options and risks were discussed with the patient.                            All questions were answered, and informed consent                            was obtained. Prior Anticoagulants: The patient has                            taken no previous anticoagulant or antiplatelet                            agents. ASA Grade Assessment: II - A patient with                            mild systemic disease. After reviewing the risks                            and benefits, the patient was deemed in                            satisfactory condition to undergo the procedure.  After obtaining informed consent, the endoscope was                            passed under direct vision. Throughout the                            procedure, the patient's blood pressure, pulse, and                            oxygen saturations were monitored continuously. The                            Endoscope was introduced through the mouth, and                            advanced to the third part of  duodenum. The upper                            GI endoscopy was accomplished without difficulty.                            The patient tolerated the procedure well. Scope In: Scope Out: Findings:                 The examined esophagus was normal. Biopsies were                            taken from the distal esophagus with a cold forceps                            for histology. Estimated blood loss was minimal.                           The entire examined stomach was normal. Biopsies                            were taken from the antrum, body, and fundus with a                            cold forceps for histology. Estimated blood loss                            was minimal.                           A small gastric polyp was present in the fundus.                            This was biopsied with cold forceps. Food (residue)                            was found in the gastric fundus and in the gastric  body.                           The examined duodenum was normal. Biopsies were                            taken with a cold forceps for histology. Estimated                            blood loss was minimal.                           The cardia and gastric fundus were normal on                            retroflexion.                           The exam was otherwise without abnormality. Complications:            No immediate complications. Estimated blood loss:                            Minimal. Estimated Blood Loss:     Estimated blood loss was minimal. Impression:               - Normal esophagus. Biopsied.                           - Normal stomach mucosa. Biopsied.                           - Small gastric polyp. Likely fundic gland polyp.                            Biopsied.                           - Food (residue) in the stomach.                           - Normal examined duodenum. Biopsied.                           - The examination was  otherwise normal. Recommendation:           - Patient has a contact number available for                            emergencies. The signs and symptoms of potential                            delayed complications were discussed with the                            patient. Return to normal activities tomorrow.  Written discharge instructions were provided to the                            patient.                           - Resume previous diet.                           - Continue present medications.                           - Await pathology results.                           - Proceed gastric emptying scan to evaluate for                            gastroparesis.                           - Follow-up in the office after the gastric                            emptying scan. Tressia Danas MD, MD 05/01/2020 10:58:06 AM This report has been signed electronically.

## 2020-05-01 NOTE — Progress Notes (Signed)
pt tolerated well. VSS. awake and to recovery. Report given to RN. Bite block used without trauma. 

## 2020-05-01 NOTE — Progress Notes (Signed)
Called to room to assist during endoscopic procedure.  Patient ID and intended procedure confirmed with present staff. Received instructions for my participation in the procedure from the performing physician.  

## 2020-05-01 NOTE — Patient Instructions (Signed)
YOU HAD AN ENDOSCOPIC PROCEDURE TODAY AT THE [facility]:   Refer to the procedure report that was given to you for any specific questions about what was found during the examination.  If the procedure report does not answer your questions, please call your gastroenterologist to clarify.  If you requested that your care partner not be given the details of your procedure findings, then the procedure report has been included in a sealed envelope for you to review at your convenience later.  YOU SHOULD EXPECT: Some feelings of bloating in the abdomen. Passage of more gas than usual.  Walking can help get rid of the air that was put into your GI tract during the procedure and reduce the bloating. If you had a lower endoscopy (such as a colonoscopy or flexible sigmoidoscopy) you may notice spotting of blood in your stool or on the toilet paper. If you underwent a bowel prep for your procedure, you may not have a normal bowel movement for a few days.  Please Note:  You might notice some irritation and congestion in your nose or some drainage.  This is from the oxygen used during your procedure.  There is no need for concern and it should clear up in a day or so.  SYMPTOMS TO REPORT IMMEDIATELY:     Following upper endoscopy (EGD)  Vomiting of blood or coffee ground material  New chest pain or pain under the shoulder blades  Painful or persistently difficult swallowing  New shortness of breath  Fever of 100F or higher  Black, tarry-looking stools  For urgent or emergent issues, a gastroenterologist can be reached at any hour by calling [phone]. Do not use MyChart messaging for urgent concerns.    DIET:  We do recommend a small meal at first, but then you may proceed to your regular diet.  Drink plenty of fluids but you should avoid alcoholic beverages for 24 hours.  MEDICATIONS: Continue present medications.  FOLLOW UP: Proceed with a gastric emptying scan to evaluate for   Gastroparesis.  Follow-up in the office for an appointment with Dr. Orvan Falconer after the gastric emptying scan is completed.  Thank you for allowing [place] to provide for your healthcare needs today.  ACTIVITY:  You should plan to take it easy for the rest of today and you should NOT DRIVE or use heavy machinery until tomorrow (because of the sedation medicines used during the test).    FOLLOW UP: Our staff will call the number listed on your records 48-72 hours following your procedure to check on you and address any questions or concerns that you may have regarding the information given to you following your procedure. If we do not reach you, we will leave a message.  We will attempt to reach you two times.  During this call, we will ask if you have developed any symptoms of COVID 19. If you develop any symptoms (ie: fever, flu-like symptoms, shortness of breath, cough etc.) before then, please call [phone].  If you test positive for Covid 19 in the 2 weeks post procedure, please call and report this information to [place].    If any biopsies were taken you will be contacted by phone or by letter within the next 1-3 weeks.  Please call us at [phone] if you have not heard about the biopsies in 3 weeks.    SIGNATURES/CONFIDENTIALITY: You and/or your care partner have signed paperwork which will be entered into your electronic medical record.  These signatures attest to the fact that that the information above on your After Visit Summary has been reviewed and is understood.  Full responsibility of the confidentiality of this discharge information lies with you and/or your care-partner.

## 2020-05-03 ENCOUNTER — Telehealth: Payer: Self-pay

## 2020-05-03 NOTE — Telephone Encounter (Signed)
  Follow up Call-  Call back number 05/01/2020  Post procedure Call Back phone  # [phone]  Permission to leave phone message Yes  Some recent data might be hidden     Patient questions:  Do you have a fever, pain , or abdominal swelling? No. Pain Score  0 *  Have you tolerated food without any problems? Yes.    Have you been able to return to your normal activities? Yes.    Do you have any questions about your discharge instructions: Diet   No. Medications  No. Follow up visit  No.  Do you have questions or concerns about your Care? No.  Actions: * If pain score is 4 or above: No action needed, pain <4.  1. Have you developed a fever since your procedure? no  2.   Have you had an respiratory symptoms (SOB or cough) since your procedure? no  3.   Have you tested positive for COVID 19 since your procedure no  4.   Have you had any family members/close contacts diagnosed with the COVID 19 since your procedure?  no   If yes to any of these questions please route to Laverna Peace, RN and Karlton Lemon, RN

## 2020-05-14 ENCOUNTER — Other Ambulatory Visit: Payer: Self-pay | Admitting: Gastroenterology

## 2020-05-15 DIAGNOSIS — Z0100 Encounter for examination of eyes and vision without abnormal findings: Secondary | ICD-10-CM | POA: Diagnosis not present

## 2020-05-15 DIAGNOSIS — H5213 Myopia, bilateral: Secondary | ICD-10-CM | POA: Diagnosis not present

## 2020-05-17 ENCOUNTER — Other Ambulatory Visit (INDEPENDENT_AMBULATORY_CARE_PROVIDER_SITE_OTHER): Payer: Medicare HMO

## 2020-05-17 ENCOUNTER — Other Ambulatory Visit: Payer: Self-pay

## 2020-05-17 DIAGNOSIS — E7800 Pure hypercholesterolemia, unspecified: Secondary | ICD-10-CM | POA: Diagnosis not present

## 2020-05-17 DIAGNOSIS — J4520 Mild intermittent asthma, uncomplicated: Secondary | ICD-10-CM | POA: Diagnosis not present

## 2020-05-17 LAB — HEPATIC FUNCTION PANEL
ALT: 14 U/L (ref 0–35)
AST: 15 U/L (ref 0–37)
Albumin: 4.3 g/dL (ref 3.5–5.2)
Alkaline Phosphatase: 124 U/L — ABNORMAL HIGH (ref 39–117)
Bilirubin, Direct: 0.1 mg/dL (ref 0.0–0.3)
Total Bilirubin: 0.6 mg/dL (ref 0.2–1.2)
Total Protein: 6.5 g/dL (ref 6.0–8.3)

## 2020-05-17 LAB — LIPID PANEL
Cholesterol: 218 mg/dL — ABNORMAL HIGH (ref 0–200)
HDL: 94.4 mg/dL (ref >39.00)
LDL Cholesterol: 107 mg/dL — ABNORMAL HIGH (ref 0–99)
NonHDL: 123.85
Total CHOL/HDL Ratio: 2
Triglycerides: 86 mg/dL (ref 0.0–149.0)
VLDL: 17.2 mg/dL (ref 0.0–40.0)

## 2020-05-17 LAB — BASIC METABOLIC PANEL
BUN: 12 mg/dL (ref 6–23)
CO2: 26 mEq/L (ref 19–32)
Calcium: 9.5 mg/dL (ref 8.4–10.5)
Chloride: 106 mEq/L (ref 96–112)
Creatinine, Ser: 0.98 mg/dL (ref 0.40–1.20)
GFR: 60.43 mL/min (ref >60.00)
Glucose, Bld: 93 mg/dL (ref 70–99)
Potassium: 3.8 mEq/L (ref 3.5–5.1)
Sodium: 140 mEq/L (ref 135–145)

## 2020-05-21 ENCOUNTER — Ambulatory Visit (INDEPENDENT_AMBULATORY_CARE_PROVIDER_SITE_OTHER): Payer: Medicare HMO | Admitting: Internal Medicine

## 2020-05-21 ENCOUNTER — Other Ambulatory Visit: Payer: Self-pay

## 2020-05-21 VITALS — BP 126/70 | HR 83 | Temp 97.6°F | Resp 16 | Ht 63.0 in | Wt 182.0 lb

## 2020-05-21 DIAGNOSIS — E7800 Pure hypercholesterolemia, unspecified: Secondary | ICD-10-CM | POA: Diagnosis not present

## 2020-05-21 DIAGNOSIS — K219 Gastro-esophageal reflux disease without esophagitis: Secondary | ICD-10-CM

## 2020-05-21 DIAGNOSIS — J4520 Mild intermittent asthma, uncomplicated: Secondary | ICD-10-CM | POA: Diagnosis not present

## 2020-05-21 DIAGNOSIS — Z800 Family history of malignant neoplasm of digestive organs: Secondary | ICD-10-CM | POA: Diagnosis not present

## 2020-05-21 DIAGNOSIS — Z1159 Encounter for screening for other viral diseases: Secondary | ICD-10-CM

## 2020-05-21 DIAGNOSIS — R748 Abnormal levels of other serum enzymes: Secondary | ICD-10-CM

## 2020-05-21 NOTE — Assessment & Plan Note (Addendum)
The 10-year ASCVD risk score Denman George DC Montez Hageman., et al., 2013) is: 4.5%   Values used to calculate the score:     Age: 65 years     Sex: Female     Is Non-Hispanic African American: No     Diabetic: No     Tobacco smoker: No     Systolic Blood Pressure: 126 mmHg     Is BP treated: No     HDL Cholesterol: 94.4 mg/dL     Total Cholesterol: 218 mg/dL  Low cholesterol diet and exercise.  Follow.  Discussed.

## 2020-05-21 NOTE — Progress Notes (Signed)
Patient ID: NATASA STIGALL, female   DOB: 29-Oct-1955, 65 y.o.   MRN: [id number]   Subjective:    Patient ID: AKIMA SLAUGH, female    DOB: November 08, 1955, 65 y.o.   MRN: [id number]  HPI This visit occurred during the SARS-CoV-2 public health emergency.  Safety protocols were in place, including screening questions prior to the visit, additional usage of staff PPE, and extensive cleaning of exam room while observing appropriate contact time as indicated for disinfecting solutions.  Patient here for a scheduled follow up. Here to follow up regarding her GI issues.  She has been having persistent problems with constipation and nausea.  Tried Linzess.  No significant change.  Expensive.  Scheduled for gastric emptying study.  Senna may help some.  miralax - too much.  Seeing Dr Orvan Falconer for w/up elevated alkaline phos. Serologic evaluation negative.  No evidence of fatty liver.  MRI normal except for pancreatic atrophy.  Taking PPI and famotidine - helped some.  No chest pain.  No increased heart rate or palpitations reported - stable.     Past Medical History:  Diagnosis Date  . Allergy   . Arthritis   . Asthma   . Chicken pox   . Complication of anesthesia   . GERD (gastroesophageal reflux disease)   . History of kidney stones   . Migraine headache   . PONV (postoperative nausea and vomiting)   . Urine incontinence    Past Surgical History:  Procedure Laterality Date  . BREAST CYST ASPIRATION Bilateral    neg  . carpal tunnel sugery     bilateral  . CHOLECYSTECTOMY N/A 11/04/2019   Procedure: LAPAROSCOPIC CHOLECYSTECTOMY WITH INTRAOPERATIVE CHOLANGIOGRAM;  Surgeon: Earline Mayotte, MD;  Location: [facility] ORS;  Service: General;  Laterality: N/A;  . COLONOSCOPY WITH PROPOFOL N/A 09/20/2018   Procedure: COLONOSCOPY WITH PROPOFOL;  Surgeon: Scot Jun, MD;  Location: [facility] ENDOSCOPY;  Service: Endoscopy;  Laterality: N/A;  . ELBOW SURGERY  1999  . ESOPHAGOGASTRODUODENOSCOPY (EGD)  WITH PROPOFOL N/A 09/20/2018   Procedure: ESOPHAGOGASTRODUODENOSCOPY (EGD) WITH PROPOFOL;  Surgeon: Scot Jun, MD;  Location: [facility] ENDOSCOPY;  Service: Endoscopy;  Laterality: N/A;  . HAND SURGERY  1999   broken finger - pins  . NASAL SINUS SURGERY  1992  . ROTATOR CUFF REPAIR  1994  . Trigger finger surgery  1999 and 2000  . VAGINAL HYSTERECTOMY  1987   secondary to fibroids   Family History  Problem Relation Age of Onset  . Colon cancer Mother        colon  . Asthma Mother   . Heart disease Father   . Hypertension Father   . Colon cancer Sister   . Stroke Maternal Grandfather   . Diabetes Maternal Grandfather   . Lung cancer Maternal Grandmother   . Breast cancer Maternal Grandmother   . Esophageal cancer Neg Hx   . Pancreatic cancer Neg Hx   . Liver disease Neg Hx   . Stomach cancer Neg Hx    Social History   Socioeconomic History  . Marital status: Married    Spouse name: Not on file  . Number of children: Not on file  . Years of education: Not on file  . Highest education level: Not on file  Occupational History  . Not on file  Tobacco Use  . Smoking status: Never Smoker  . Smokeless tobacco: Never Used  Vaping Use  . Vaping Use: Never used  Substance and Sexual Activity  .  Alcohol use: No  . Drug use: No  . Sexual activity: Yes    Birth control/protection: None, Post-menopausal  Other Topics Concern  . Not on file  Social History Narrative  . Not on file   Social Determinants of Health   Financial Resource Strain: Not on file  Food Insecurity: Not on file  Transportation Needs: Not on file  Physical Activity: Not on file  Stress: Not on file  Social Connections: Not on file    Outpatient Encounter Medications as of 05/21/2020  Medication Sig  . acetaminophen (TYLENOL) 500 MG tablet Take 1,000 mg by mouth daily as needed for mild pain or headache.   . albuterol (PROVENTIL HFA;VENTOLIN HFA) 108 (90 Base) MCG/ACT inhaler Inhale 2 puffs into  the lungs every 6 (six) hours as needed for wheezing or shortness of breath.  . benzonatate (TESSALON) 200 MG capsule Take 1 capsule (200 mg total) by mouth 3 (three) times daily as needed for cough.  Tery Sanfilippo Calcium (STOOL SOFTENER PO) Take 1 tablet by mouth daily.   . famotidine (PEPCID) 20 MG tablet TAKE 1 TABLET BY MOUTH TWICE A DAY  . fluticasone (FLOVENT HFA) 110 MCG/ACT inhaler Inhale 2 puffs into the lungs 2 (two) times daily.  Marland Kitchen imipramine (TOFRANIL) 25 MG tablet Take 1 tablet (25 mg total) by mouth 2 (two) times daily.  . pantoprazole (PROTONIX) 40 MG tablet TAKE 1 TABLET BY MOUTH TWICE A DAY BEFORE A MEAL  . tolterodine (DETROL LA) 4 MG 24 hr capsule Take 1 capsule (4 mg total) by mouth daily.  Marland Kitchen topiramate (TOPAMAX) 25 MG tablet TAKE 1 TABLET BY MOUTH TWICE A DAY (Patient taking differently: Take 25 mg by mouth 2 (two) times daily.)  . [DISCONTINUED] linaclotide (LINZESS) 72 MCG capsule Take 1 capsule (72 mcg total) by mouth daily before breakfast. (Patient not taking: Reported on 05/01/2020)   Facility-Administered Encounter Medications as of 05/21/2020  Medication  . 0.9 %  sodium chloride infusion    Review of Systems  Constitutional: Negative for appetite change and unexpected weight change.  HENT: Negative for congestion and sinus pressure.   Respiratory: Negative for cough, chest tightness and shortness of breath.   Cardiovascular: Negative for chest pain, palpitations and leg swelling.  Gastrointestinal: Negative for vomiting.       History of nausea and RUQ pain as outlined.  Bowels as outlined.    Genitourinary: Negative for difficulty urinating and dysuria.  Musculoskeletal: Negative for joint swelling and myalgias.  Skin: Negative for color change and rash.  Neurological: Negative for dizziness, light-headedness and headaches.  Psychiatric/Behavioral: Negative for agitation and dysphoric mood.       Objective:    Physical Exam Vitals reviewed.   Constitutional:      General: She is not in acute distress.    Appearance: Normal appearance.  HENT:     Head: Normocephalic and atraumatic.     Right Ear: External ear normal.     Left Ear: External ear normal.     Mouth/Throat:     Mouth: Oropharynx is clear and moist.  Eyes:     General: No scleral icterus.       Right eye: No discharge.        Left eye: No discharge.     Conjunctiva/sclera: Conjunctivae normal.  Neck:     Thyroid: No thyromegaly.  Cardiovascular:     Rate and Rhythm: Normal rate and regular rhythm.  Pulmonary:     Effort: No respiratory  distress.     Breath sounds: Normal breath sounds. No wheezing.  Abdominal:     General: Bowel sounds are normal.     Palpations: Abdomen is soft.     Tenderness: There is no abdominal tenderness.  Musculoskeletal:        General: No swelling, tenderness or edema.     Cervical back: Neck supple. No tenderness.  Lymphadenopathy:     Cervical: No cervical adenopathy.  Skin:    Findings: No erythema or rash.  Neurological:     Mental Status: She is alert.  Psychiatric:        Mood and Affect: Mood normal.        Behavior: Behavior normal.     BP 126/70   Pulse 83   Temp 97.6 F (36.4 C) (Oral)   Resp 16   Ht 5\' 3"  (1.6 m)   Wt 182 lb (82.6 kg)   SpO2 99%   BMI 32.24 kg/m  Wt Readings from Last 3 Encounters:  05/21/20 182 lb (82.6 kg)  05/01/20 178 lb (80.7 kg)  03/06/20 178 lb (80.7 kg)     Lab Results  Component Value Date   WBC 4.1 10/03/2019   HGB 13.6 10/03/2019   HCT 40.6 10/03/2019   PLT 211.0 10/03/2019   GLUCOSE 93 05/17/2020   CHOL 218 (H) 05/17/2020   TRIG 86.0 05/17/2020   HDL 94.40 05/17/2020   LDLCALC 107 (H) 05/17/2020   ALT 14 05/17/2020   AST 15 05/17/2020   NA 140 05/17/2020   K 3.8 05/17/2020   CL 106 05/17/2020   CREATININE 0.98 05/17/2020   BUN 12 05/17/2020   CO2 26 05/17/2020   TSH 2.01 01/17/2020   HGBA1C 5.4 10/03/2019    DG Chest 2 View  Result Date:  02/14/2020 CLINICAL DATA:  History of COVID-19 positivity, follow-up exam EXAM: CHEST - 2 VIEW COMPARISON:  03/20/2017 FINDINGS: Cardiac shadow is within normal limits. The lungs are well aerated bilaterally. No focal infiltrate or sizable effusion is seen. No acute bony abnormality is noted. IMPRESSION: No acute abnormality seen. Electronically Signed   By: 03/22/2017 M.D.   On: 02/14/2020 15:43       Assessment & Plan:   Problem List Items Addressed This Visit    Asthma    Breathing stable.  Has rescue inhaler if needed.  CXR 02/14/20 - no acute abnormality.        Elevated alkaline phosphatase level    W/up in progress as outlined.  Follow liver function tests.  Liver panel checked 05/17/20 - alkaline phosphatase level - 124.  Follow.       Relevant Orders   Hepatic function panel   Family history of colon cancer    Reviewed.  Colonoscopy 2020. Due 2025.        GERD (gastroesophageal reflux disease)    EGD 04/2020 - gastritis.  Continue PPI and pepcid.  Has helped.  Planning for gastric emptying study.  Scheduled.  Follow.  Continue f/u with GI.       Hypercholesteremia    The 10-year ASCVD risk score 05/2020 DC Jr., et al., 2013) is: 4.5%   Values used to calculate the score:     Age: 56 years     Sex: Female     Is Non-Hispanic African American: No     Diabetic: No     Tobacco smoker: No     Systolic Blood Pressure: 126 mmHg     Is BP  treated: No     HDL Cholesterol: 94.4 mg/dL     Total Cholesterol: 218 mg/dL  Low cholesterol diet and exercise.  Follow.  Discussed.        Relevant Orders   CBC with Differential/Platelet   Hepatic function panel   Lipid panel   Basic metabolic panel    Other Visit Diagnoses    Need for hepatitis C screening test    -  Primary   Relevant Orders   Hepatitis C antibody       Dale Durhamharlene Penelopi Mikrut, MD

## 2020-05-27 ENCOUNTER — Encounter: Payer: Self-pay | Admitting: Internal Medicine

## 2020-05-27 NOTE — Assessment & Plan Note (Signed)
W/up in progress as outlined.  Follow liver function tests.  Liver panel checked 05/17/20 - alkaline phosphatase level - 124.  Follow.

## 2020-05-27 NOTE — Assessment & Plan Note (Signed)
EGD 04/2020 - gastritis.  Continue PPI and pepcid.  Has helped.  Planning for gastric emptying study.  Scheduled.  Follow.  Continue f/u with GI.

## 2020-05-27 NOTE — Assessment & Plan Note (Signed)
Breathing stable.  Has rescue inhaler if needed.  CXR 02/14/20 - no acute abnormality.

## 2020-05-27 NOTE — Assessment & Plan Note (Signed)
Reviewed.  Colonoscopy 2020. Due 2025.   

## 2020-05-28 ENCOUNTER — Other Ambulatory Visit: Payer: Self-pay

## 2020-05-28 ENCOUNTER — Ambulatory Visit (HOSPITAL_COMMUNITY)
Admission: RE | Admit: 2020-05-28 | Discharge: 2020-05-28 | Disposition: A | Discharge status: Home / Self Care | Payer: Medicare HMO | Source: Ambulatory Visit | Attending: Gastroenterology | Admitting: Gastroenterology

## 2020-05-28 DIAGNOSIS — R109 Unspecified abdominal pain: Secondary | ICD-10-CM | POA: Diagnosis not present

## 2020-05-28 DIAGNOSIS — R140 Abdominal distension (gaseous): Secondary | ICD-10-CM | POA: Diagnosis not present

## 2020-05-28 DIAGNOSIS — R112 Nausea with vomiting, unspecified: Secondary | ICD-10-CM | POA: Diagnosis not present

## 2020-05-28 DIAGNOSIS — R1013 Epigastric pain: Secondary | ICD-10-CM | POA: Diagnosis not present

## 2020-05-28 DIAGNOSIS — R6881 Early satiety: Secondary | ICD-10-CM | POA: Diagnosis not present

## 2020-05-28 MED ORDER — TECHNETIUM TC 99M SULFUR COLLOID
2.0000 | Freq: Once | INTRAVENOUS | Status: AC | PRN
  Administered 2020-05-28: 2 via INTRAVENOUS

## 2020-06-13 ENCOUNTER — Ambulatory Visit: Payer: Medicare HMO | Admitting: Gastroenterology

## 2020-06-13 ENCOUNTER — Encounter: Payer: Self-pay | Admitting: Gastroenterology

## 2020-06-13 ENCOUNTER — Other Ambulatory Visit: Payer: Self-pay

## 2020-06-13 VITALS — BP 130/74 | HR 76 | Ht 63.0 in | Wt 180.4 lb

## 2020-06-13 DIAGNOSIS — R748 Abnormal levels of other serum enzymes: Secondary | ICD-10-CM

## 2020-06-13 DIAGNOSIS — R112 Nausea with vomiting, unspecified: Secondary | ICD-10-CM | POA: Diagnosis not present

## 2020-06-13 DIAGNOSIS — R109 Unspecified abdominal pain: Secondary | ICD-10-CM

## 2020-06-13 MED ORDER — SUCRALFATE 1 GM/10ML PO SUSP: 1.0000 g | Freq: Four times a day (QID) | ORAL | 1 refills | Status: DC

## 2020-06-13 NOTE — Progress Notes (Signed)
Referring Provider: Einar Pheasant, MD Primary Care Physician:  Einar Pheasant, MD  Chief complaint:  Elevated alk phos   IMPRESSION:  Abnormal alk phos Reflux and H pylori negative gastritis Nausea and abdominal pain Family history of colon cancer (mother at age 66, sister in her 65s)  Elevated alkaline phosphatase without extrahepatic biliary dilatation on ultrasound, CT, or MRI. Alk phos is mildly elevated. Isoenzymes are normal. Will continue to follow. Consider additional serologic evaluation +/- liver biopsy is the magnitude of elevation increases.  Reflux and gastritis: Likely source of recent GI symptoms.Gastric emptying scan. However, she is starting to feel better. Perhaps with additional time, her symptoms will improve. Will add Carafate today. 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   Family of colon cancer: Last colonoscopy 2000. Repeat colonoscopy in 2025, earlier with new symptoms.   PLAN: Continue pantoprazole 40 mg BID and Famotidine 20 mg BID Carafate 1g slurry QID PRN Follow-up in 3-4 months, earlier as needed  Please see the "Patient Instructions" section for addition details about the plan.  HPI: Savannah Gomez is a 65 y.o. female initially referred by Dr. Nicki Reaper for an isolated elevated alk phos. Initially identified in 2019, it has fluctuated since that time, but has never been more than 1.5x ULN. No improvement after cholecystectomy 11/04/19 for chronic cholecystitis, cholelithiasis.  Evaluation of elevated alk phos included:  - Labs 05/17/20: normal CMP except for alk phos 124 - Labs 03/08/20: Alk phos isoenzymes are normal - Abd ultrasound 06/09/18: no findings, no stones - CT abd/pelvis with contrast scan 07/26/18: diverticulosis, no acute findings - MRI/MRCP 01/30/20: moderate pancreatic atrophy, normal liver and biliary tree  Had an EGD for multiple chronic GI symptoms including  regurgitation, nausea, globus and chest pain that radiated to the back despite pantoprazole 40 mg BID for several years for reflux. Using Mylanta and TUMS with some relief. Also has chronic constipation.   EGD 05/01/2020 showed reflux, gastritis, fundic gland polyps, and retained food.  Duodenal biopsies were normal.  There was no H. pylori.  I recommended that she continue her PPI twice daily and famotidine twice daily.  She was asked to avoid all NSAIDs.  Gastric emptying scan 05/28/2020 was normal  Returns in follow-up today. Still having regurgitation, nausea - not nearly as bad. Chest and abdominal pain has resolved.  Insurance would not cover Linzess. She tried some samples but this did not provide relief.   No new complaints or concerns.   Endoscopic history: - EGD for RUQ pain and Colonoscopy 08/2018 with Dr. Vira Agar at the [facility]: hiatal hernia, no H pylori, small hyperplastic polyp, diverticulosis, small internal hemorrhoids - EGD 05/01/2020 showed reflux, gastritis, fundic gland polyps, and retained food.  Duodenal biopsies were normal.  There was no H. pylori.  I recommended that she continue her PPI twice daily and famotidine twice daily.  She was asked to avoid all NSAIDs.  Past Medical History:  Diagnosis Date  . Allergy   . Arthritis   . Asthma   . Chicken pox   . Complication of anesthesia   . GERD (gastroesophageal reflux disease)   . History of kidney stones   . Migraine headache   . PONV (postoperative nausea and vomiting)   . Urine incontinence     Past Surgical History:  Procedure Laterality Date  . BREAST CYST ASPIRATION Bilateral    neg  . carpal tunnel sugery     bilateral  .  CHOLECYSTECTOMY N/A 11/04/2019   Procedure: LAPAROSCOPIC CHOLECYSTECTOMY WITH INTRAOPERATIVE CHOLANGIOGRAM;  Surgeon: Robert Bellow, MD;  Location: [facility] ORS;  Service: General;  Laterality: N/A;  . COLONOSCOPY WITH PROPOFOL N/A 09/20/2018   Procedure: COLONOSCOPY WITH PROPOFOL;   Surgeon: Manya Silvas, MD;  Location[facility] ENDOSCOPY;  Service: Endoscopy;  Laterality: N/A;  . ELBOW SURGERY  1999  . ESOPHAGOGASTRODUODENOSCOPY (EGD) WITH PROPOFOL N/A 09/20/2018   Procedure: ESOPHAGOGASTRODUODENOSCOPY (EGD) WITH PROPOFOL;  Surgeon: Manya Silvas, MD;  Location: [facility] ENDOSCOPY;  Service: Endoscopy;  Laterality: N/A;  . HAND SURGERY  1999   broken finger - pins  . NASAL SINUS SURGERY  1992  . ROTATOR CUFF REPAIR  1994  . Trigger finger surgery  1999 and 2000  . VAGINAL HYSTERECTOMY  1987   secondary to fibroids    Current Outpatient Medications  Medication Sig Dispense Refill  . acetaminophen (TYLENOL) 500 MG tablet Take 1,000 mg by mouth daily as needed for mild pain or headache.     . albuterol (PROVENTIL HFA;VENTOLIN HFA) 108 (90 Base) MCG/ACT inhaler Inhale 2 puffs into the lungs every 6 (six) hours as needed for wheezing or shortness of breath. 1 Inhaler 5  . benzonatate (TESSALON) 200 MG capsule Take 1 capsule (200 mg total) by mouth 3 (three) times daily as needed for cough. 30 capsule 1  . Docusate Calcium (STOOL SOFTENER PO) Take 1 tablet by mouth daily.     . famotidine (PEPCID) 20 MG tablet TAKE 1 TABLET BY MOUTH TWICE A DAY 60 tablet 3  . fluticasone (FLOVENT HFA) 110 MCG/ACT inhaler Inhale 2 puffs into the lungs 2 (two) times daily. 1 g 0  . imipramine (TOFRANIL) 25 MG tablet Take 1 tablet (25 mg total) by mouth 2 (two) times daily. 180 tablet 3  . pantoprazole (PROTONIX) 40 MG tablet TAKE 1 TABLET BY MOUTH TWICE A DAY BEFORE A MEAL 180 tablet 1  . sucralfate (CARAFATE) 1 GM/10ML suspension Take 10 mLs (1 g total) by mouth 4 (four) times daily. 420 mL 1  . tolterodine (DETROL LA) 4 MG 24 hr capsule Take 1 capsule (4 mg total) by mouth daily. 90 capsule 3  . topiramate (TOPAMAX) 25 MG tablet TAKE 1 TABLET BY MOUTH TWICE A DAY (Patient taking differently: Take 25 mg by mouth 2 (two) times daily.) 180 tablet 1   No current facility-administered  medications for this visit.    Allergies as of 06/13/2020 - Review Complete 06/13/2020  Allergen Reaction Noted  . Demerol [meperidine] Nausea Only 01/16/2012     Physical Exam: General:   Alert,  well-nourished, pleasant and cooperative in NAD Head:  Normocephalic and atraumatic. Eyes:  Sclera clear, no icterus.   Conjunctiva pink. No xanthelasma.  Abdomen:  Soft, I am unable to reproduce her pain but she localizes it to the midline port scar from her cholecystectomy, nondistended, normal bowel sounds, no rebound or guarding. No hepatosplenomegaly.  Neurologic:  Alert and  oriented x4;  grossly nonfocal Skin: No rash or bruise. No spider angioma, palmar erythema, or Terry nails.  Psych:  Alert and cooperative. Normal mood and affect.     Fergus Throne L. Tarri Glenn, MD, MPH 06/17/2020, 9:54 PM

## 2020-06-13 NOTE — Patient Instructions (Signed)
If you are age 66 or older, your body mass index should be between 23-30. Your Body mass index is 31.96 kg/m. If this is out of the aforementioned range listed, please consider follow up with your Primary Care Provider.  If you are age 67 or younger, your body mass index should be between 19-25. Your Body mass index is 31.96 kg/m. If this is out of the aformentioned range listed, please consider follow up with your Primary Care Provider.   Continue current medications pantoprazole and pepcid twice a day   Start Carafate 1 gram 4 times a day as needed   It was a pleasure to see you today!  Dr. Orvan Falconer

## 2020-06-19 ENCOUNTER — Other Ambulatory Visit: Payer: Self-pay | Admitting: Internal Medicine

## 2020-07-09 ENCOUNTER — Other Ambulatory Visit: Payer: Self-pay | Admitting: Internal Medicine

## 2020-07-09 DIAGNOSIS — J4520 Mild intermittent asthma, uncomplicated: Secondary | ICD-10-CM

## 2020-07-24 ENCOUNTER — Other Ambulatory Visit: Payer: Self-pay | Admitting: Internal Medicine

## 2020-07-26 ENCOUNTER — Telehealth: Payer: Self-pay | Admitting: Internal Medicine

## 2020-07-26 NOTE — Telephone Encounter (Signed)
Left message for patient to call back and schedule Medicare Annual Wellness Visit (AWV)   Please schedule on the [facility] Nurse Health Advisor template  No hx of previous AWV. please schedule at anytime.  This should be a 45 minute visit!  WTM should have been before 06/22/20

## 2020-08-06 ENCOUNTER — Other Ambulatory Visit: Payer: Self-pay | Admitting: Gastroenterology

## 2020-09-14 ENCOUNTER — Other Ambulatory Visit (INDEPENDENT_AMBULATORY_CARE_PROVIDER_SITE_OTHER): Payer: Medicare HMO

## 2020-09-14 ENCOUNTER — Other Ambulatory Visit: Payer: Self-pay

## 2020-09-14 ENCOUNTER — Ambulatory Visit (INDEPENDENT_AMBULATORY_CARE_PROVIDER_SITE_OTHER): Payer: Medicare HMO

## 2020-09-14 VITALS — Ht 63.0 in | Wt 180.0 lb

## 2020-09-14 DIAGNOSIS — Z1159 Encounter for screening for other viral diseases: Secondary | ICD-10-CM

## 2020-09-14 DIAGNOSIS — Z1231 Encounter for screening mammogram for malignant neoplasm of breast: Secondary | ICD-10-CM | POA: Diagnosis not present

## 2020-09-14 DIAGNOSIS — R748 Abnormal levels of other serum enzymes: Secondary | ICD-10-CM

## 2020-09-14 DIAGNOSIS — E7800 Pure hypercholesterolemia, unspecified: Secondary | ICD-10-CM

## 2020-09-14 DIAGNOSIS — Z0000 Encounter for general adult medical examination without abnormal findings: Secondary | ICD-10-CM

## 2020-09-14 LAB — CBC WITH DIFFERENTIAL/PLATELET
Basophils Absolute: 0 10*3/uL (ref 0.0–0.1)
Basophils Relative: 0.6 % (ref 0.0–3.0)
Eosinophils Absolute: 0.3 10*3/uL (ref 0.0–0.7)
Eosinophils Relative: 7.2 % — ABNORMAL HIGH (ref 0.0–5.0)
HCT: 41.6 % (ref 36.0–46.0)
Hemoglobin: 13.9 g/dL (ref 12.0–15.0)
Lymphocytes Relative: 30.5 % (ref 12.0–46.0)
Lymphs Abs: 1.4 10*3/uL (ref 0.7–4.0)
MCHC: 33.3 g/dL (ref 30.0–36.0)
MCV: 84.6 fl (ref 78.0–100.0)
Monocytes Absolute: 0.3 10*3/uL (ref 0.1–1.0)
Monocytes Relative: 6.5 % (ref 3.0–12.0)
Neutro Abs: 2.4 10*3/uL (ref 1.4–7.7)
Neutrophils Relative %: 55.2 % (ref 43.0–77.0)
Platelets: 251 10*3/uL (ref 150.0–400.0)
RBC: 4.92 Mil/uL (ref 3.87–5.11)
RDW: 15.4 % (ref 11.5–15.5)
WBC: 4.4 10*3/uL (ref 4.0–10.5)

## 2020-09-14 LAB — HEPATIC FUNCTION PANEL
ALT: 25 U/L (ref 0–35)
AST: 18 U/L (ref 0–37)
Albumin: 4.6 g/dL (ref 3.5–5.2)
Alkaline Phosphatase: 153 U/L — ABNORMAL HIGH (ref 39–117)
Bilirubin, Direct: 0.1 mg/dL (ref 0.0–0.3)
Total Bilirubin: 0.7 mg/dL (ref 0.2–1.2)
Total Protein: 6.8 g/dL (ref 6.0–8.3)

## 2020-09-14 LAB — LIPID PANEL
Cholesterol: 239 mg/dL — ABNORMAL HIGH (ref 0–200)
HDL: 90.7 mg/dL (ref >39.00)
LDL Cholesterol: 127 mg/dL — ABNORMAL HIGH (ref 0–99)
NonHDL: 148.16
Total CHOL/HDL Ratio: 3
Triglycerides: 108 mg/dL (ref 0.0–149.0)
VLDL: 21.6 mg/dL (ref 0.0–40.0)

## 2020-09-14 LAB — BASIC METABOLIC PANEL
BUN: 13 mg/dL (ref 6–23)
CO2: 28 mEq/L (ref 19–32)
Calcium: 9.8 mg/dL (ref 8.4–10.5)
Chloride: 104 mEq/L (ref 96–112)
Creatinine, Ser: 0.92 mg/dL (ref 0.40–1.20)
GFR: 65.04 mL/min (ref >60.00)
Glucose, Bld: 102 mg/dL — ABNORMAL HIGH (ref 70–99)
Potassium: 4.6 mEq/L (ref 3.5–5.1)
Sodium: 140 mEq/L (ref 135–145)

## 2020-09-14 NOTE — Patient Instructions (Addendum)
Savannah Gomez , Thank you for taking time to come for your Medicare Wellness Visit. I appreciate your ongoing commitment to your health goals. Please review the following plan we discussed and let me know if I can assist you in the future.   These are the goals we discussed:  Goals      Maintain healthy lifestyle     Stay active Healthy diet         This is a list of the screening recommended for you and due dates:  Health Maintenance  Topic Date Due   Hepatitis C Screening: USPSTF Recommendation to screen - Ages 75-79 yo.  Never done   COVID-19 Vaccine (4 - Booster for Pfizer series) 09/30/2020*   Tetanus Vaccine  09/14/2021*   Flu Shot  10/22/2020   Mammogram  11/28/2020   Pneumonia vaccines (2 of 2 - PPSV23) 08/19/2021   Colon Cancer Screening  09/19/2028   DEXA scan (bone density measurement)  Completed   Zoster (Shingles) Vaccine  Completed   HPV Vaccine  Aged Out  *Topic was postponed. The date shown is not the original due date.    Advanced directives: End of life planning; Advance aging; Advanced directives discussed.  Copy of current HCPOA/Living Will requested.    Conditions/risks identified: none new  Follow up in one year for your annual wellness visit.   Preventive Care 31 Years and Older, Female Preventive care refers to lifestyle choices and visits with your health care provider that can promote health and wellness. What does preventive care include? A yearly physical exam. This is also called an annual well check. Dental exams once or twice a year. Routine eye exams. Ask your health care provider how often you should have your eyes checked. Personal lifestyle choices, including: Daily care of your teeth and gums. Regular physical activity. Eating a healthy diet. Avoiding tobacco and drug use. Limiting alcohol use. Practicing safe sex. Taking low-dose aspirin every day. Taking vitamin and mineral supplements as recommended by your health care  provider. What happens during an annual well check? The services and screenings done by your health care provider during your annual well check will depend on your age, overall health, lifestyle risk factors, and family history of disease. Counseling  Your health care provider may ask you questions about your: Alcohol use. Tobacco use. Drug use. Emotional well-being. Home and relationship well-being. Sexual activity. Eating habits. History of falls. Memory and ability to understand (cognition). Work and work Astronomer. Reproductive health. Screening  You may have the following tests or measurements: Height, weight, and BMI. Blood pressure. Lipid and cholesterol levels. These may be checked every 5 years, or more frequently if you are over 33 years old. Skin check. Lung cancer screening. You may have this screening every year starting at age 78 if you have a 30-pack-year history of smoking and currently smoke or have quit within the past 15 years. Fecal occult blood test (FOBT) of the stool. You may have this test every year starting at age 40. Flexible sigmoidoscopy or colonoscopy. You may have a sigmoidoscopy every 5 years or a colonoscopy every 10 years starting at age 1. Hepatitis C blood test. Hepatitis B blood test. Sexually transmitted disease (STD) testing. Diabetes screening. This is done by checking your blood sugar (glucose) after you have not eaten for a while (fasting). You may have this done every 1-3 years. Bone density scan. This is done to screen for osteoporosis. You may have this done starting at age  65. Mammogram. This may be done every 1-2 years. Talk to your health care provider about how often you should have regular mammograms. Talk with your health care provider about your test results, treatment options, and if necessary, the need for more tests. Vaccines  Your health care provider may recommend certain vaccines, such as: Influenza vaccine. This is  recommended every year. Tetanus, diphtheria, and acellular pertussis (Tdap, Td) vaccine. You may need a Td booster every 10 years. Zoster vaccine. You may need this after age 81. Pneumococcal 13-valent conjugate (PCV13) vaccine. One dose is recommended after age 71. Pneumococcal polysaccharide (PPSV23) vaccine. One dose is recommended after age 36. Talk to your health care provider about which screenings and vaccines you need and how often you need them. This information is not intended to replace advice given to you by your health care provider. Make sure you discuss any questions you have with your health care provider. Document Released: 04/06/2015 Document Revised: 11/28/2015 Document Reviewed: 01/09/2015 Elsevier Interactive Patient Education  2017 [place] Prevention in the Home Falls can cause injuries. They can happen to people of all ages. There are many things you can do to make your home safe and to help prevent falls. What can I do on the outside of my home? Regularly fix the edges of walkways and driveways and fix any cracks. Remove anything that might make you trip as you walk through a door, such as a raised step or threshold. Trim any bushes or trees on the path to your home. Use bright outdoor lighting. Clear any walking paths of anything that might make someone trip, such as rocks or tools. Regularly check to see if handrails are loose or broken. Make sure that both sides of any steps have handrails. Any raised decks and porches should have guardrails on the edges. Have any leaves, snow, or ice cleared regularly. Use sand or salt on walking paths during winter. Clean up any spills in your garage right away. This includes oil or grease spills. What can I do in the bathroom? Use night lights. Install grab bars by the toilet and in the tub and shower. Do not use towel bars as grab bars. Use non-skid mats or decals in the tub or shower. If you need to sit down in  the shower, use a plastic, non-slip stool. Keep the floor dry. Clean up any water that spills on the floor as soon as it happens. Remove soap buildup in the tub or shower regularly. Attach bath mats securely with double-sided non-slip rug tape. Do not have throw rugs and other things on the floor that can make you trip. What can I do in the bedroom? Use night lights. Make sure that you have a light by your bed that is easy to reach. Do not use any sheets or blankets that are too big for your bed. They should not hang down onto the floor. Have a firm chair that has side arms. You can use this for support while you get dressed. Do not have throw rugs and other things on the floor that can make you trip. What can I do in the kitchen? Clean up any spills right away. Avoid walking on wet floors. Keep items that you use a lot in easy-to-reach places. If you need to reach something above you, use a strong step stool that has a grab bar. Keep electrical cords out of the way. Do not use floor polish or wax that makes floors slippery.  If you must use wax, use non-skid floor wax. Do not have throw rugs and other things on the floor that can make you trip. What can I do with my stairs? Do not leave any items on the stairs. Make sure that there are handrails on both sides of the stairs and use them. Fix handrails that are broken or loose. Make sure that handrails are as long as the stairways. Check any carpeting to make sure that it is firmly attached to the stairs. Fix any carpet that is loose or worn. Avoid having throw rugs at the top or bottom of the stairs. If you do have throw rugs, attach them to the floor with carpet tape. Make sure that you have a light switch at the top of the stairs and the bottom of the stairs. If you do not have them, ask someone to add them for you. What else can I do to help prevent falls? Wear shoes that: Do not have high heels. Have rubber bottoms. Are comfortable  and fit you well. Are closed at the toe. Do not wear sandals. If you use a stepladder: Make sure that it is fully opened. Do not climb a closed stepladder. Make sure that both sides of the stepladder are locked into place. Ask someone to hold it for you, if possible. Clearly mark and make sure that you can see: Any grab bars or handrails. First and last steps. Where the edge of each step is. Use tools that help you move around (mobility aids) if they are needed. These include: Canes. Walkers. Scooters. Crutches. Turn on the lights when you go into a dark area. Replace any light bulbs as soon as they burn out. Set up your furniture so you have a clear path. Avoid moving your furniture around. If any of your floors are uneven, fix them. If there are any pets around you, be aware of where they are. Review your medicines with your doctor. Some medicines can make you feel dizzy. This can increase your chance of falling. Ask your doctor what other things that you can do to help prevent falls. This information is not intended to replace advice given to you by your health care provider. Make sure you discuss any questions you have with your health care provider. Document Released: 01/04/2009 Document Revised: 08/16/2015 Document Reviewed: 04/14/2014 Elsevier Interactive Patient Education  2017 [employer].

## 2020-09-14 NOTE — Progress Notes (Signed)
Subjective:   Savannah Gomez is a [age] y.o. female who presents for Medicare Annual (Subsequent) preventive examination.  Review of Systems    No ROS.  Medicare Wellness Virtual Visit.  Visual/audio telehealth visit, UTA vital signs.   See social history for additional risk factors.   Cardiac Risk Factors include: advanced age (>5255men, 31>65 women)     Objective:    Today's Vitals   09/14/20 0827  Weight: 180 lb (81.6 kg)  Height: 5\' 3"  (1.6 m)   Body mass index is 31.89 kg/m.  Advanced Directives 09/14/2020 11/04/2019 10/26/2019 09/20/2018 09/08/2017 02/11/2015 02/01/2015  Does Patient Have a Medical Advance Directive? Yes Yes Yes Yes Yes Yes Yes  Type of Estate agentAdvance Directive Healthcare Power of [place]Attorney;Living will Healthcare Power of [place]orney;Living will Healthcare Power of [place]orney;Living will Healthcare Power of [place]orney;Living will Healthcare Power of [place]orney;Living will Healthcare Power of eBayttorney Healthcare Power of [place]orney;Living will  Does patient want to make changes to medical advance directive? No - Patient declined No - Patient declined - - - - -  Copy of Healthcare Power of Attorney in Chart? No - copy requested Yes - validated most recent copy scanned in chart (See row information) - No - copy requested No - copy requested No - copy requested -    Current Medications (verified) Outpatient Encounter Medications as of 09/14/2020  Medication Sig   acetaminophen (TYLENOL) 500 MG tablet Take 1,000 mg by mouth daily as needed for mild pain or headache.    albuterol (PROVENTIL HFA;VENTOLIN HFA) 108 (90 Base) MCG/ACT inhaler Inhale 2 puffs into the lungs every 6 (six) hours as needed for wheezing or shortness of breath.   benzonatate (TESSALON) 200 MG capsule Take 1 capsule (200 mg total) by mouth 3 (three) times daily as needed for cough.   Docusate Calcium (STOOL SOFTENER PO) Take 1 tablet by mouth daily.    famotidine (PEPCID) 20 MG tablet TAKE 1 TABLET BY MOUTH TWICE A DAY    FLOVENT HFA 110 MCG/ACT inhaler INHALE 2 PUFFS BY MOUTH INTO THE LUNGS TWICE A DAY.   imipramine (TOFRANIL) 25 MG tablet Take 1 tablet (25 mg total) by mouth 2 (two) times daily.   pantoprazole (PROTONIX) 40 MG tablet TAKE 1 TABLET BY MOUTH TWICE A DAY BEFORE A MEAL   sucralfate (CARAFATE) 1 GM/10ML suspension TAKE 10 ML (1 GRAMS TOTAL) BY MOUTH 4 TIMES DAILY   tolterodine (DETROL LA) 4 MG 24 hr capsule Take 1 capsule (4 mg total) by mouth daily.   topiramate (TOPAMAX) 25 MG tablet TAKE 1 TABLET BY MOUTH TWICE A DAY   No facility-administered encounter medications on file as of 09/14/2020.    Allergies (verified) Demerol [meperidine]   History: Past Medical History:  Diagnosis Date   Allergy    Arthritis    Asthma    Chicken pox    Complication of anesthesia    GERD (gastroesophageal reflux disease)    History of kidney stones    Migraine headache    PONV (postoperative nausea and vomiting)    Urine incontinence    Past Surgical History:  Procedure Laterality Date   BREAST CYST ASPIRATION Bilateral    neg   carpal tunnel sugery     bilateral   CHOLECYSTECTOMY N/A 11/04/2019   Procedure: LAPAROSCOPIC CHOLECYSTECTOMY WITH INTRAOPERATIVE CHOLANGIOGRAM;  Surgeon: Earline MayotteByrnett, Jeffrey W, MD;  Location: [facility] ORS;  Service: General;  Laterality: N/A;   COLONOSCOPY WITH PROPOFOL N/A 09/20/2018   Procedure: COLONOSCOPY WITH PROPOFOL;  Surgeon: Scot Jun, MD;  Location: [facility] ENDOSCOPY;  Service: Endoscopy;  Laterality: N/A;   ELBOW SURGERY  1999   ESOPHAGOGASTRODUODENOSCOPY (EGD) WITH PROPOFOL N/A 09/20/2018   Procedure: ESOPHAGOGASTRODUODENOSCOPY (EGD) WITH PROPOFOL;  Surgeon: Scot Jun, MD;  Location: [facility] ENDOSCOPY;  Service: Endoscopy;  Laterality: N/A;   HAND SURGERY  1999   broken finger - pins   NASAL SINUS SURGERY  1992   ROTATOR CUFF REPAIR  1994   Trigger finger surgery  1999 and 2000   VAGINAL HYSTERECTOMY  1987   secondary to fibroids   Family History   Problem Relation Age of Onset   Colon cancer Mother        colon   Asthma Mother    Heart disease Father    Hypertension Father    Colon cancer Sister    Stroke Maternal Grandfather    Diabetes Maternal Grandfather    Lung cancer Maternal Grandmother    Breast cancer Maternal Grandmother    Esophageal cancer Neg Hx    Pancreatic cancer Neg Hx    Liver disease Neg Hx    Stomach cancer Neg Hx    Social History   Socioeconomic History   Marital status: Married    Spouse name: Not on file   Number of children: Not on file   Years of education: Not on file   Highest education level: Not on file  Occupational History   Not on file  Tobacco Use   Smoking status: Never   Smokeless tobacco: Never  Vaping Use   Vaping Use: Never used  Substance and Sexual Activity   Alcohol use: No   Drug use: No   Sexual activity: Yes    Birth control/protection: None, Post-menopausal  Other Topics Concern   Not on file  Social History Narrative   Not on file   Social Determinants of Health   Financial Resource Strain: Low Risk    Difficulty of Paying Living Expenses: Not hard at all  Food Insecurity: No Food Insecurity   Worried About Programme researcher, broadcasting/film/video in the Last Year: Never true   Ran Out of Food in the Last Year: Never true  Transportation Needs: No Transportation Needs   Lack of Transportation (Medical): No   Lack of Transportation (Non-Medical): No  Physical Activity: Not on file  Stress: No Stress Concern Present   Feeling of Stress : Not at all  Social Connections: Unknown   Frequency of Communication with Friends and Family: More than three times a week   Frequency of Social Gatherings with Friends and Family: More than three times a week   Attends Religious Services: Not on Scientist, clinical (histocompatibility and immunogenetics) or Organizations: Not on file   Attends Banker Meetings: Not on file   Marital Status: Not on file    Tobacco Counseling Counseling given: Not  Answered   Clinical Intake:  Pre-visit preparation completed: Yes        Diabetes: No  How often do you need to have someone help you when you read instructions, pamphlets, or other written materials from your doctor or pharmacy?: 1 - Never    Interpreter Needed?: No      Activities of Daily Living In your present state of health, do you have any difficulty performing the following activities: 09/14/2020 10/26/2019  Hearing? N N  Vision? N N  Difficulty concentrating or making decisions? N N  Walking or climbing stairs? N N  Dressing  or bathing? N N  Doing errands, shopping? N N  Preparing Food and eating ? N -  Using the Toilet? N -  In the past six months, have you accidently leaked urine? N -  Do you have problems with loss of bowel control? N -  Managing your Medications? N -  Managing your Finances? N -  Housekeeping or managing your Housekeeping? N -  Some recent data might be hidden    Patient Care Team: Dale Benton [place], MD as PCP - General (Internal Medicine)  Indicate any recent Medical Services you may have received from other than [facility] providers in the past year (date may be approximate).     Assessment:   This is a routine wellness examination for Frimet.  I connected with Jalea today by telephone and verified that I am speaking with the correct person using two identifiers. Location patient: home Location provider: work Persons participating in the virtual visit: patient, Engineer, civil (consulting).    I discussed the limitations, risks, security and privacy concerns of performing an evaluation and management service by telephone and the availability of in person appointments. The patient expressed understanding and verbally consented to this telephonic visit.    Interactive audio and video telecommunications were attempted between this provider and patient, however failed, due to patient having technical difficulties OR patient did not have access to video capability.   We continued and completed visit with audio only.  Some vital signs may be absent or patient reported.   Hearing/Vision screen Hearing Screening - Comments:: Patient is able to hear conversational tones without difficulty.  No issues reported. Vision Screening - Comments:: Visual acuity not assessed, virtual visit.  They have seen their ophthalmologist in the last 12 months.    Dietary issues and exercise activities discussed: Current Exercise Habits: Home exercise routine, Intensity: Mild Healthy diet Good water intake   Goals Addressed             This Visit's Progress    Maintain healthy lifestyle       Stay active Healthy diet        Depression Screen PHQ 2/9 Scores 09/14/2020 05/21/2020 03/04/2019 04/02/2017 04/07/2016 01/17/2016 03/27/2015  PHQ - 2 Score 0 0 0 0 0 0 0    Fall Risk Fall Risk  09/14/2020 01/17/2020 09/19/2019 07/27/2018 07/27/2018  Falls in the past year? 0 0 0 0 0  Number falls in past yr: 0 0 - - 0  Injury with Fall? 0 0 - - 0  Follow up Falls evaluation completed Falls evaluation completed Falls evaluation completed - -    FALL RISK PREVENTION PERTAINING TO THE HOME: Handrails in use when climbing stairs? YES Home free of loose throw rugs in walkways, pet beds, electrical cords, etc? Yes  Adequate lighting in your home to reduce risk of falls? Yes   ASSISTIVE DEVICES UTILIZED TO PREVENT FALLS: Life alert? No  Use of a cane, walker or w/c? No              TIMED UP AND GO: Was the test performed? No .   Cognitive Function:  Patient is alert and oriented x3.  Denies difficulty focusing, making decisions, memory loss. MMSE/6CIT deferred. Normal by direct communication/observation.      Immunizations Immunization History  Administered Date(s) Administered   Fluad Quad(high Dose 65+) 01/17/2020   Influenza Split 12/18/2015   Influenza,inj,Quad PF,6+ Mos 01/08/2017, 01/01/2018   Influenza-Unspecified 12/17/2012, 01/05/2014, 01/08/2015,  01/01/2018, 12/23/2018   PFIZER(Purple Top)SARS-COV-2 Vaccination 07/14/2019,  08/09/2019, 04/19/2020   Pneumococcal Conjugate-13 09/19/2019   Pneumococcal Polysaccharide-23 08/19/2016   Zoster Recombinat (Shingrix) 02/09/2019, 05/10/2019    TDAP status: Due, Education has been provided regarding the importance of this vaccine. Advised may receive this vaccine at local pharmacy or Health Dept. Aware to provide a copy of the vaccination record if obtained from local pharmacy or Health Dept. Verbalized acceptance and understanding. Deferred.  Covid boost number 2 not yet received.   Health Maintenance Health Maintenance  Topic Date Due   Hepatitis C Screening  Never done   COVID-19 Vaccine (4 - Booster for Pfizer series) 09/30/2020 (Originally 08/17/2020)   TETANUS/TDAP  09/14/2021 (Originally 07/17/1974)   INFLUENZA VACCINE  10/22/2020   MAMMOGRAM  11/28/2020   PNA vac Low Risk Adult (2 of 2 - PPSV23) 08/19/2021   COLONOSCOPY (Pts 45-36yrs Insurance coverage will need to be confirmed)  09/19/2028   DEXA SCAN  Completed   Zoster Vaccines- Shingrix  Completed   HPV VACCINES  Aged Out   Lung Cancer Screening: (Low Dose CT Chest recommended if Age 68-80 years, 30 pack-year currently smoking OR have quit w/in 15years.) does not qualify.   Hepatitis C Screening: Completed today.  Dental Screening: Recommended annual dental exams for proper oral hygiene. Visits every 6 months.   Community Resource Referral / Chronic Care Management: CRR required this visit?  No   CCM required this visit?  No      Plan:   Keep all routine maintenance appointments.   I have personally reviewed and noted the following in the patient's chart:   Medical and social history Use of alcohol, tobacco or illicit drugs  Current medications and supplements including opioid prescriptions. Patient is not currently taking opioid.  Functional ability and status Nutritional status Physical activity Advanced  directives List of other physicians Hospitalizations, surgeries, and ER visits in previous 12 months Vitals Screenings to include cognitive, depression, and falls Referrals and appointments  In addition, I have reviewed and discussed with patient certain preventive protocols, quality metrics, and best practice recommendations. A written personalized care plan for preventive services as well as general preventive health recommendations were provided to patient via mychart.     Ashok Pall, LPN   08/09/8414

## 2020-09-16 DIAGNOSIS — M5416 Radiculopathy, lumbar region: Secondary | ICD-10-CM | POA: Diagnosis not present

## 2020-09-17 LAB — HEPATITIS C ANTIBODY
Hepatitis C Ab: NONREACTIVE
SIGNAL TO CUT-OFF: 0 (ref <1.00)

## 2020-09-18 ENCOUNTER — Ambulatory Visit (INDEPENDENT_AMBULATORY_CARE_PROVIDER_SITE_OTHER): Payer: Medicare HMO | Admitting: Internal Medicine

## 2020-09-18 ENCOUNTER — Other Ambulatory Visit: Payer: Self-pay

## 2020-09-18 VITALS — BP 126/76 | HR 84 | Temp 96.4°F | Resp 16 | Ht 63.0 in | Wt 186.0 lb

## 2020-09-18 DIAGNOSIS — Z1231 Encounter for screening mammogram for malignant neoplasm of breast: Secondary | ICD-10-CM

## 2020-09-18 DIAGNOSIS — J4520 Mild intermittent asthma, uncomplicated: Secondary | ICD-10-CM

## 2020-09-18 DIAGNOSIS — K219 Gastro-esophageal reflux disease without esophagitis: Secondary | ICD-10-CM | POA: Diagnosis not present

## 2020-09-18 DIAGNOSIS — R748 Abnormal levels of other serum enzymes: Secondary | ICD-10-CM

## 2020-09-18 DIAGNOSIS — N3010 Interstitial cystitis (chronic) without hematuria: Secondary | ICD-10-CM

## 2020-09-18 DIAGNOSIS — M5441 Lumbago with sciatica, right side: Secondary | ICD-10-CM | POA: Diagnosis not present

## 2020-09-18 DIAGNOSIS — Z0000 Encounter for general adult medical examination without abnormal findings: Secondary | ICD-10-CM | POA: Diagnosis not present

## 2020-09-18 DIAGNOSIS — E7800 Pure hypercholesterolemia, unspecified: Secondary | ICD-10-CM

## 2020-09-18 NOTE — Progress Notes (Signed)
Patient ID: Savannah Gomez, female   DOB: 08-Aug-1955, 65 y.o.   MRN: [id number]   Subjective:    Patient ID: Savannah Gomez, female    DOB: 1955/06/10, 66 y.o.   MRN: [id number]  HPI This visit occurred during the SARS-CoV-2 public health emergency.  Safety protocols were in place, including screening questions prior to the visit, additional usage of staff PPE, and extensive cleaning of exam room while observing appropriate contact time as indicated for disinfecting solutions.   Patient here for her physical exam.  Has seen gastroenterology recently.  Last evaluated March 2022 for elevated alkaline phosphatase.  No extrahepatic biliary dilatation on ultrasound, CT or MRI.  Felt that reflux and gastritis were the likely source of her GI symptoms.  Carafate added.  Recommended continuing pantoprazole and famotidine.  Recommended follow-up in 3 to 4 months.  States she still has intermittent what she terms "COVID days".  Noticed some stomach cramping and fatigue.  Adjust her diet.  Will notice some headache and fatigue as well.  Again intermittent occurrence.  No chest pain.  Breathing overall appears to be stable.  No increased cough or congestion.  Was evaluated at [facility] this past weekend.  Having right sided back pain.  Could not lift her leg.  Was given Toradol.  Diagnosed with sciatica.  Placed on muscle relaxant and prednisone.  Has follow-up planned.  Past Medical History:  Diagnosis Date   Allergy    Arthritis    Asthma    Chicken pox    Complication of anesthesia    GERD (gastroesophageal reflux disease)    History of kidney stones    Migraine headache    PONV (postoperative nausea and vomiting)    Urine incontinence    Past Surgical History:  Procedure Laterality Date   BREAST CYST ASPIRATION Bilateral    neg   carpal tunnel sugery     bilateral   CHOLECYSTECTOMY N/A 11/04/2019   Procedure: LAPAROSCOPIC CHOLECYSTECTOMY WITH INTRAOPERATIVE CHOLANGIOGRAM;  Surgeon:  Earline Mayotte, MD;  Location: [facility] ORS;  Service: General;  Laterality: N/A;   COLONOSCOPY WITH PROPOFOL N/A 09/20/2018   Procedure: COLONOSCOPY WITH PROPOFOL;  Surgeon: Scot Jun, MD;  Location: [facility] ENDOSCOPY;  Service: Endoscopy;  Laterality: N/A;   ELBOW SURGERY  1999   ESOPHAGOGASTRODUODENOSCOPY (EGD) WITH PROPOFOL N/A 09/20/2018   Procedure: ESOPHAGOGASTRODUODENOSCOPY (EGD) WITH PROPOFOL;  Surgeon: Scot Jun, MD;  Location: [facility] ENDOSCOPY;  Service: Endoscopy;  Laterality: N/A;   HAND SURGERY  1999   broken finger - pins   NASAL SINUS SURGERY  1992   ROTATOR CUFF REPAIR  1994   Trigger finger surgery  1999 and 2000   VAGINAL HYSTERECTOMY  1987   secondary to fibroids   Family History  Problem Relation Age of Onset   Colon cancer Mother        colon   Asthma Mother    Heart disease Father    Hypertension Father    Colon cancer Sister    Stroke Maternal Grandfather    Diabetes Maternal Grandfather    Lung cancer Maternal Grandmother    Breast cancer Maternal Grandmother    Esophageal cancer Neg Hx    Pancreatic cancer Neg Hx    Liver disease Neg Hx    Stomach cancer Neg Hx    Social History   Socioeconomic History   Marital status: Married    Spouse name: Not on file   Number of children: Not on file  Years of education: Not on file   Highest education level: Not on file  Occupational History   Not on file  Tobacco Use   Smoking status: Never   Smokeless tobacco: Never  Vaping Use   Vaping Use: Never used  Substance and Sexual Activity   Alcohol use: No   Drug use: No   Sexual activity: Yes    Birth control/protection: None, Post-menopausal  Other Topics Concern   Not on file  Social History Narrative   Not on file   Social Determinants of Health   Financial Resource Strain: Low Risk    Difficulty of Paying Living Expenses: Not hard at all  Food Insecurity: No Food Insecurity   Worried About Programme researcher, broadcasting/film/video in the Last Year:  Never true   Ran Out of Food in the Last Year: Never true  Transportation Needs: No Transportation Needs   Lack of Transportation (Medical): No   Lack of Transportation (Non-Medical): No  Physical Activity: Not on file  Stress: No Stress Concern Present   Feeling of Stress : Not at all  Social Connections: Unknown   Frequency of Communication with Friends and Family: More than three times a week   Frequency of Social Gatherings with Friends and Family: More than three times a week   Attends Religious Services: Not on file   Active Member of Clubs or Organizations: Not on file   Attends Banker Meetings: Not on file   Marital Status: Not on file    Review of Systems  Constitutional:  Negative for appetite change and unexpected weight change.  HENT:  Negative for congestion, sinus pressure and sore throat.   Eyes:  Negative for pain and visual disturbance.  Respiratory:  Negative for cough, chest tightness and shortness of breath.   Cardiovascular:  Negative for chest pain, palpitations and leg swelling.  Gastrointestinal:  Negative for diarrhea, nausea and vomiting.       Intermittent cramping as outlined.   Genitourinary:  Negative for difficulty urinating and dysuria.  Musculoskeletal:  Positive for back pain. Negative for joint swelling and myalgias.  Skin:  Negative for color change and rash.  Neurological:  Negative for dizziness, light-headedness and headaches.  Hematological:  Negative for adenopathy. Does not bruise/bleed easily.  Psychiatric/Behavioral:  Negative for agitation and dysphoric mood.       Objective:    Physical Exam Vitals reviewed.  Constitutional:      General: She is not in acute distress.    Appearance: Normal appearance. She is well-developed.  HENT:     Head: Normocephalic and atraumatic.     Right Ear: External ear normal.     Left Ear: External ear normal.  Eyes:     General: No scleral icterus.       Right eye: No discharge.         Left eye: No discharge.     Conjunctiva/sclera: Conjunctivae normal.  Neck:     Thyroid: No thyromegaly.  Cardiovascular:     Rate and Rhythm: Normal rate and regular rhythm.  Pulmonary:     Effort: No tachypnea, accessory muscle usage or respiratory distress.     Breath sounds: Normal breath sounds. No decreased breath sounds or wheezing.  Chest:  Breasts:    Right: No inverted nipple, mass, nipple discharge or tenderness (no axillary adenopathy).     Left: No inverted nipple, mass, nipple discharge or tenderness (no axilarry adenopathy).  Abdominal:     General: Bowel  sounds are normal.     Palpations: Abdomen is soft.     Tenderness: There is no abdominal tenderness.  Musculoskeletal:        General: No swelling or tenderness.     Cervical back: Neck supple.  Lymphadenopathy:     Cervical: No cervical adenopathy.  Skin:    Findings: No erythema or rash.  Neurological:     Mental Status: She is alert and oriented to person, place, and time.  Psychiatric:        Mood and Affect: Mood normal.        Behavior: Behavior normal.    BP 126/76   Pulse 84   Temp (!) 96.4 F (35.8 C)   Resp 16   Ht 5\' 3"  (1.6 m)   Wt 186 lb (84.4 kg)   SpO2 99%   BMI 32.95 kg/m  Wt Readings from Last 3 Encounters:  09/18/20 186 lb (84.4 kg)  09/14/20 180 lb (81.6 kg)  06/13/20 180 lb 6.4 oz (81.8 kg)    Outpatient Encounter Medications as of 09/18/2020  Medication Sig   acetaminophen (TYLENOL) 500 MG tablet Take 1,000 mg by mouth daily as needed for mild pain or headache.    albuterol (PROVENTIL HFA;VENTOLIN HFA) 108 (90 Base) MCG/ACT inhaler Inhale 2 puffs into the lungs every 6 (six) hours as needed for wheezing or shortness of breath.   benzonatate (TESSALON) 200 MG capsule Take 1 capsule (200 mg total) by mouth 3 (three) times daily as needed for cough.   cyclobenzaprine (FLEXERIL) 5 MG tablet Take 5 mg by mouth daily as needed.   Docusate Calcium (STOOL SOFTENER PO) Take 1  tablet by mouth daily.    famotidine (PEPCID) 20 MG tablet TAKE 1 TABLET BY MOUTH TWICE A DAY   FLOVENT HFA 110 MCG/ACT inhaler INHALE 2 PUFFS BY MOUTH INTO THE LUNGS TWICE A DAY.   imipramine (TOFRANIL) 25 MG tablet Take 1 tablet (25 mg total) by mouth 2 (two) times daily.   pantoprazole (PROTONIX) 40 MG tablet TAKE 1 TABLET BY MOUTH TWICE A DAY BEFORE A MEAL   tolterodine (DETROL LA) 4 MG 24 hr capsule Take 1 capsule (4 mg total) by mouth daily.   topiramate (TOPAMAX) 25 MG tablet TAKE 1 TABLET BY MOUTH TWICE A DAY   [DISCONTINUED] sucralfate (CARAFATE) 1 GM/10ML suspension TAKE 10 ML (1 GRAMS TOTAL) BY MOUTH 4 TIMES DAILY   No facility-administered encounter medications on file as of 09/18/2020.     Lab Results  Component Value Date   WBC 4.4 09/14/2020   HGB 13.9 09/14/2020   HCT 41.6 09/14/2020   PLT 251.0 09/14/2020   GLUCOSE 102 (H) 09/14/2020   CHOL 239 (H) 09/14/2020   TRIG 108.0 09/14/2020   HDL 90.70 09/14/2020   LDLCALC 127 (H) 09/14/2020   ALT 25 09/14/2020   AST 18 09/14/2020   NA 140 09/14/2020   K 4.6 09/14/2020   CL 104 09/14/2020   CREATININE 0.92 09/14/2020   BUN 13 09/14/2020   CO2 28 09/14/2020   TSH 2.01 01/17/2020   HGBA1C 5.4 10/03/2019    NM Gastric Emptying  Result Date: 05/28/2020 CLINICAL DATA:  Epigastric pain and bloating.  Early satiety. EXAM: NUCLEAR MEDICINE GASTRIC EMPTYING SCAN TECHNIQUE: After oral ingestion of radiolabeled meal, sequential abdominal images were obtained for 4 hours. Percentage of activity emptying the stomach was calculated at 1 hour, 2 hour, 3 hour, and 4 hours. RADIOPHARMACEUTICALS:  2.0 mCi Tc-6581m sulfur colloid in  standardized meal COMPARISON:  None. FINDINGS: Expected location of the stomach in the left upper quadrant. Ingested meal empties the stomach gradually over the course of the study. 35% emptied at 1 hr ( normal >= 10%) 64% emptied at 2 hr ( normal >= 40%) 93% emptied at 3 hr ( normal >= 70%) 100% emptied at 4 hr (  normal >= 90%) IMPRESSION: Normal gastric emptying study. Electronically Signed   By: Danae Orleans M.D.   On: 05/28/2020 14:18       Assessment & Plan:   Problem List Items Addressed This Visit     Asthma    Breathing stable.  Has rescue inhaler if needed.  Chest x-ray November 2021 revealed no acute abnormality.       Back pain    Back pain as outlined.  Saw [facility].  Given Toradol.  Placed on prednisone and a muscle relaxant.  Has follow-up plan.       Relevant Medications   cyclobenzaprine (FLEXERIL) 5 MG tablet   Elevated alkaline phosphatase level    Seen GI.  No biliary ductal dilatation noted on ultrasound, CT or MRI.  Follow liver panel.       GERD (gastroesophageal reflux disease)    EGD February 2022-gastritis.  Continue proton pump inhibitor and Pepcid.  Carafate added.  Continues to follow-up with GI (Dr. Orvan Falconer).       Health care maintenance    Physical today 09/18/20.  Mammogram 11/29/19 - Birads I.  Colonoscopy 08/2018 - recommended f/u in 2025 (Dr Orvan Falconer).         Hypercholesteremia    Low-cholesterol diet and exercise.  Follow lipid panel.       Relevant Orders   Basic metabolic panel   Hepatic function panel   Lipid panel   TSH   Interstitial cystitis    Has been evaluated and followed by urology.       Other Visit Diagnoses     Routine general medical examination at a health care facility    -  Primary   Visit for screening mammogram            Dale [place], MD

## 2020-09-18 NOTE — Assessment & Plan Note (Signed)
Physical today 09/18/20.  Mammogram 11/29/19 - Birads I.  Colonoscopy 08/2018 - recommended f/u in 2025 (Dr Orvan Falconer).

## 2020-09-20 ENCOUNTER — Other Ambulatory Visit: Payer: Self-pay | Admitting: Gastroenterology

## 2020-09-24 ENCOUNTER — Encounter: Payer: Self-pay | Admitting: Internal Medicine

## 2020-09-24 DIAGNOSIS — M549 Dorsalgia, unspecified: Secondary | ICD-10-CM | POA: Insufficient documentation

## 2020-09-24 NOTE — Assessment & Plan Note (Signed)
Low cholesterol diet and exercise.  Follow lipid panel.   

## 2020-09-24 NOTE — Assessment & Plan Note (Signed)
Breathing stable.  Has rescue inhaler if needed.  Chest x-ray November 2021 revealed no acute abnormality.

## 2020-09-24 NOTE — Assessment & Plan Note (Signed)
Back pain as outlined.  Saw [facility].  Given Toradol.  Placed on prednisone and a muscle relaxant.  Has follow-up plan.

## 2020-09-24 NOTE — Assessment & Plan Note (Signed)
EGD February 2022-gastritis.  Continue proton pump inhibitor and Pepcid.  Carafate added.  Continues to follow-up with GI (Dr. Orvan Falconer).

## 2020-09-24 NOTE — Assessment & Plan Note (Signed)
Seen GI.  No biliary ductal dilatation noted on ultrasound, CT or MRI.  Follow liver panel.

## 2020-09-24 NOTE — Assessment & Plan Note (Signed)
Has been evaluated and followed by urology.

## 2020-10-01 DIAGNOSIS — M7061 Trochanteric bursitis, right hip: Secondary | ICD-10-CM | POA: Diagnosis not present

## 2020-10-01 DIAGNOSIS — M7062 Trochanteric bursitis, left hip: Secondary | ICD-10-CM | POA: Diagnosis not present

## 2020-10-01 DIAGNOSIS — M5416 Radiculopathy, lumbar region: Secondary | ICD-10-CM | POA: Diagnosis not present

## 2020-10-04 DIAGNOSIS — M5416 Radiculopathy, lumbar region: Secondary | ICD-10-CM | POA: Diagnosis not present

## 2020-10-31 DIAGNOSIS — M5416 Radiculopathy, lumbar region: Secondary | ICD-10-CM | POA: Diagnosis not present

## 2020-11-02 ENCOUNTER — Other Ambulatory Visit: Payer: Medicare HMO

## 2020-11-02 ENCOUNTER — Encounter: Payer: Self-pay | Admitting: Gastroenterology

## 2020-11-02 ENCOUNTER — Ambulatory Visit: Payer: Medicare HMO | Admitting: Gastroenterology

## 2020-11-02 VITALS — BP 124/78 | HR 76 | Ht 63.0 in | Wt 194.4 lb

## 2020-11-02 DIAGNOSIS — K5901 Slow transit constipation: Secondary | ICD-10-CM

## 2020-11-02 DIAGNOSIS — R748 Abnormal levels of other serum enzymes: Secondary | ICD-10-CM

## 2020-11-02 DIAGNOSIS — R112 Nausea with vomiting, unspecified: Secondary | ICD-10-CM

## 2020-11-02 MED ORDER — SUCRALFATE 1 GM/10ML PO SUSP
ORAL | 1 refills | Status: DC
Start: 2020-11-02 — End: 2021-01-22

## 2020-11-02 MED ORDER — FAMOTIDINE 20 MG PO TABS: 20.0000 mg | ORAL_TABLET | Freq: Two times a day (BID) | ORAL | 3 refills | Status: DC

## 2020-11-02 MED ORDER — LUBIPROSTONE 24 MCG PO CAPS: 24.0000 ug | ORAL_CAPSULE | Freq: Two times a day (BID) | ORAL | 3 refills | Status: DC

## 2020-11-02 NOTE — Patient Instructions (Addendum)
It was a pleasure to see you today.   I have recommended labs today to follow-up on your elevated alk phos.   Continue to take your pantoprazole and famotidine.  I am hoping that improving your constipation will also improve your nausea. I would like you to try Amitiza 24 mcg twice daily.   We also discussed a trial of FDGuard. Take 2 capsules taken twice daily for 4 weeks.  Please take FDGard 30 to 60 minutes before meals with water.  There is no distinct pattern of side effects with FDGard, but, sometimes patients experience a mild tingling sensation in the gut within the first 30 minutes. This sensation should subsequently subside.    As your sciatica improves, I also hope some of your GI symptoms will improve.   I'd like to see you back in the office in 3-4 months, earlier if needed.  Medication Samples have been provided to the patient.  Drug name: FDGard       LOT: [id number]  Exp.Date: 09-2021  Dosing instructions: as directed   The patient has been instructed regarding the correct time, dose, and frequency of taking this medication, including desired effects and most common side effects.   Your provider has requested that you go to the basement level for lab work before leaving today. Press "B" on the elevator. The lab is located at the first door on the left as you exit the elevator.  Due to recent changes in healthcare laws, you may see the results of your imaging and laboratory studies on MyChart before your provider has had a chance to review them.  We understand that in some cases there may be results that are confusing or concerning to you. Not all laboratory results come back in the same time frame and the provider may be waiting for multiple results in order to interpret others.  Please give [place] 48 hours in order for your provider to thoroughly review all the results before contacting the office for clarification of your results.   Thank you for trusting me with your  gastrointestinal care!    Thornton Park, MD, MPH

## 2020-11-02 NOTE — Progress Notes (Signed)
Referring Provider: Einar Pheasant, MD Primary Care Physician:  Einar Pheasant, MD  Chief complaint:  Elevated alk phos   IMPRESSION:  Abnormal alk phos Reflux and H pylori negative gastritis Nausea and abdominal pain Constipation Family history of colon cancer (mother at age 66, sister in her 67s)  Elevated alkaline phosphatase without extrahepatic biliary dilatation on ultrasound, CT, or MRI. Magnitude of alk phos has increased. Isoenzymes previously normal. Will repeat isoenzymes today. \ Consider additional serologic evaluation +/- liver biopsy is the magnitude of elevation increases.  Reflux and gastritis: Thought to be the source of her GI symptoms.Normal gastric emptying scan. Continue PPI BID and H2B. Treat constipation more aggressively. Trial of FDGuard in the meantime.  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  Chronic constipation: May be contributing to nausea and abdominal pain. Insurance denied Linzess. Miralax did not help. Will try Amitiza. Motegrity would be a good option.    Family of colon cancer: Last colonoscopy 2000. Repeat colonoscopy in 2025, earlier with new symptoms.   PLAN: Alk phos isoenzyes now that magnitude of elevation has increased Continue pantoprazole 40 mg BID and Famotidine 20 mg BID Amitiza 24 mcg twice daily Trial of FDGuard (samples provided today) Carafate 1g slurry QID PRN Follow-up in 3-4 months, earlier as needed  Please see the "Patient Instructions" section for addition details about the plan.  HPI: Savannah Gomez is a 65 y.o. female initially referred by Dr. Nicki Reaper for an isolated elevated alk phos. Initially identified in 2019, it has fluctuated since that time. Labs reviewed today show that it has increased from 124 to 153.  No change in alk phos seen after cholecystectomy 11/04/19 for chronic cholecystitis, cholelithiasis.  Evaluation of elevated alk phos included:  - Labs 05/17/20:  normal CMP except for alk phos 124 - Labs 03/08/20: Alk phos isoenzymes are normal - Abd ultrasound 06/09/18: no findings, no stones - CT abd/pelvis with contrast scan 07/26/18: diverticulosis, no acute findings - MRI/MRCP 01/30/20: moderate pancreatic atrophy, normal liver and biliary tree  Had an EGD for multiple chronic GI symptoms including regurgitation, nausea, globus and chest pain that radiated to the back despite pantoprazole 40 mg BID for several years for reflux. Using Mylanta and TUMS with some relief. Also has chronic constipation.   EGD 05/01/2020 showed reflux, gastritis, fundic gland polyps, and retained food.  Duodenal biopsies were normal.  There was no H. pylori.  I recommended that she continue her PPI twice daily and famotidine twice daily.  She was asked to avoid all NSAIDs.  Gastric emptying scan 05/28/2020 was normal  Returns in follow-up today. Primary recent complaint has been sciatica. Started gabapentin in July and added a muscle relaxant. Still having regurgitation, nausea, and epigastric pain - not nearly as bad. Having a bowel movement weekly. GI symptoms may worsen as constipation progresses. Sense of complete evacuation. No improvement with Linzess. Miralax caused loose stools.  Insurance would not cover Linzess. She tried some samples but this did not provide relief.   Weight has increased 2 pounds. GI ROS is otherwise negative.   Endoscopic history: - EGD for RUQ pain and Colonoscopy 08/2018 with Dr. Vira Agar at the [facility]: hiatal hernia, no H pylori, small hyperplastic polyp, diverticulosis, small internal hemorrhoids - EGD 05/01/2020 showed reflux, gastritis, fundic gland polyps, and retained food.  Duodenal biopsies were normal.  There was no H. pylori.  I recommended that she continue her PPI twice daily and famotidine twice daily.  She  was asked to avoid all NSAIDs.  Past Medical History:  Diagnosis Date   Allergy    Arthritis    Asthma    Chicken pox     Complication of anesthesia    GERD (gastroesophageal reflux disease)    History of kidney stones    Migraine headache    PONV (postoperative nausea and vomiting)    Urine incontinence     Past Surgical History:  Procedure Laterality Date   BREAST CYST ASPIRATION Bilateral    neg   carpal tunnel sugery     bilateral   CHOLECYSTECTOMY N/A 11/04/2019   Procedure: LAPAROSCOPIC CHOLECYSTECTOMY WITH INTRAOPERATIVE CHOLANGIOGRAM;  Surgeon: Robert Bellow, MD;  Location: [facility] ORS;  Service: General;  Laterality: N/A;   COLONOSCOPY WITH PROPOFOL N/A 09/20/2018   Procedure: COLONOSCOPY WITH PROPOFOL;  Surgeon: Manya Silvas, MD;  Location: [facility] ENDOSCOPY;  Service: Endoscopy;  Laterality: N/A;   ELBOW SURGERY  1999   ESOPHAGOGASTRODUODENOSCOPY (EGD) WITH PROPOFOL N/A 09/20/2018   Procedure: ESOPHAGOGASTRODUODENOSCOPY (EGD) WITH PROPOFOL;  Surgeon: Manya Silvas, MD;  Location: [facility] ENDOSCOPY;  Service: Endoscopy;  Laterality: N/A;   HAND SURGERY  1999   broken finger - pins   NASAL SINUS SURGERY  1992   ROTATOR CUFF REPAIR  1994   Trigger finger surgery  1999 and [place]   secondary to fibroids    Current Outpatient Medications  Medication Sig Dispense Refill   acetaminophen (TYLENOL) 500 MG tablet Take 1,000 mg by mouth daily as needed for mild pain or headache.      albuterol (PROVENTIL HFA;VENTOLIN HFA) 108 (90 Base) MCG/ACT inhaler Inhale 2 puffs into the lungs every 6 (six) hours as needed for wheezing or shortness of breath. 1 Inhaler 5   benzonatate (TESSALON) 200 MG capsule Take 1 capsule (200 mg total) by mouth 3 (three) times daily as needed for cough. 30 capsule 1   cyclobenzaprine (FLEXERIL) 5 MG tablet Take 5 mg by mouth daily as needed.     Docusate Calcium (STOOL SOFTENER PO) Take 1 tablet by mouth daily.      famotidine (PEPCID) 20 MG tablet TAKE 1 TABLET BY MOUTH TWICE A DAY 60 tablet 3   FLOVENT HFA 110 MCG/ACT inhaler INHALE 2 PUFFS BY  MOUTH INTO THE LUNGS TWICE A DAY. 12 g 2   gabapentin (NEURONTIN) 300 MG capsule Take 1 capsule by mouth 2 (two) times daily.     imipramine (TOFRANIL) 25 MG tablet Take 1 tablet (25 mg total) by mouth 2 (two) times daily. 180 tablet 3   pantoprazole (PROTONIX) 40 MG tablet TAKE 1 TABLET BY MOUTH TWICE A DAY BEFORE A MEAL 180 tablet 1   sucralfate (CARAFATE) 1 GM/10ML suspension TAKE 10 ML (1 GRAMS TOTAL) BY MOUTH 4 TIMES DAILY 420 mL 1   tiZANidine (ZANAFLEX) 2 MG tablet Take 4 mg by mouth 2 (two) times daily.     tolterodine (DETROL LA) 4 MG 24 hr capsule Take 1 capsule (4 mg total) by mouth daily. 90 capsule 3   topiramate (TOPAMAX) 25 MG tablet TAKE 1 TABLET BY MOUTH TWICE A DAY 180 tablet 1   traMADol (ULTRAM) 50 MG tablet Take 50 mg by mouth every 6 (six) hours as needed.     No current facility-administered medications for this visit.    Allergies as of 11/02/2020 - Review Complete 11/02/2020  Allergen Reaction Noted   Demerol [meperidine] Nausea Only 01/16/2012  Physical Exam: General:   Alert,  well-nourished, pleasant and cooperative in NAD Head:  Normocephalic and atraumatic. Eyes:  Sclera clear, no icterus.   Conjunctiva pink. No xanthelasma.  Abdomen:  Soft, I am unable to reproduce her pain but she localizes it to the midline port scar from her cholecystectomy, nondistended, normal bowel sounds, no rebound or guarding. No hepatosplenomegaly.  Neurologic:  Alert and  oriented x4;  grossly nonfocal Skin: No rash or bruise. No spider angioma, palmar erythema, or Terry nails.  Psych:  Alert and cooperative. Normal mood and affect.     Erroll Wilbourne L. Tarri Glenn, MD, MPH 11/02/2020, 10:29 AM

## 2020-11-06 LAB — ALKALINE PHOSPHATASE ISOENZYMES
Alkaline phosphatase (APISO): 159 U/L — ABNORMAL HIGH (ref 37–153)
Bone Isoenzymes: 42 % (ref 28–66)
Intestinal Isoenzymes: 0 % — ABNORMAL LOW (ref 1–24)
Liver Isoenzymes: 58 % (ref 25–69)
Macrohepatic isoenzymes: 0 % (ref <=0)
Placental isoenzymes: 0 % (ref <=0)

## 2020-11-09 ENCOUNTER — Ambulatory Visit: Payer: Self-pay | Admitting: Urology

## 2020-11-13 DIAGNOSIS — M5416 Radiculopathy, lumbar region: Secondary | ICD-10-CM | POA: Diagnosis not present

## 2020-11-15 ENCOUNTER — Telehealth: Payer: Self-pay

## 2020-11-15 NOTE — Telephone Encounter (Signed)
APPROVAL  Medication: [employer]: [employer] PA response: APPROVED Approval dates: 03/24/20 through 03/23/21 Misc. Notes: Dashea Faraci Key: [id number] - PA Case ID: [id number] - Rx #: [id number] Need help? Call us at [phone] Outcome Approvedtoday Your request has been approved Drug Lubiprostone capsules Form [employer] Medicare Electronic PA Form (2017 NCPDP) Original Claim Info [id number] 01NON-FORMULARY DRUG, CONTACT PRESCRIBER 02NON FORMULARY. PLEASE CONTACT MD 03(  PRIOR AUTHORIZATION  PA initiation date: 11/15/20  Medication: Amitiza Insurance Company: Lobbyist completed electronically through [employer] My Meds: Yes  Will await insurance response re: approval/denial.

## 2020-11-16 DIAGNOSIS — M5416 Radiculopathy, lumbar region: Secondary | ICD-10-CM | POA: Diagnosis not present

## 2020-11-21 ENCOUNTER — Other Ambulatory Visit: Payer: Self-pay | Admitting: *Deleted

## 2020-11-21 DIAGNOSIS — N200 Calculus of kidney: Secondary | ICD-10-CM

## 2020-11-22 ENCOUNTER — Ambulatory Visit
Admission: RE | Admit: 2020-11-22 | Discharge: 2020-11-22 | Disposition: A | Discharge status: Home / Self Care | Payer: Medicare HMO | Source: Ambulatory Visit | Attending: Urology | Admitting: Urology

## 2020-11-22 ENCOUNTER — Encounter: Payer: Self-pay | Admitting: Urology

## 2020-11-22 ENCOUNTER — Ambulatory Visit: Payer: Medicare HMO | Admitting: Urology

## 2020-11-22 ENCOUNTER — Other Ambulatory Visit: Payer: Self-pay

## 2020-11-22 VITALS — BP 124/69 | HR 79 | Ht 63.0 in | Wt 185.0 lb

## 2020-11-22 DIAGNOSIS — N200 Calculus of kidney: Secondary | ICD-10-CM | POA: Diagnosis not present

## 2020-11-22 DIAGNOSIS — N3941 Urge incontinence: Secondary | ICD-10-CM | POA: Diagnosis not present

## 2020-11-22 DIAGNOSIS — Z87442 Personal history of urinary calculi: Secondary | ICD-10-CM | POA: Diagnosis not present

## 2020-11-22 DIAGNOSIS — Z9049 Acquired absence of other specified parts of digestive tract: Secondary | ICD-10-CM | POA: Diagnosis not present

## 2020-11-22 DIAGNOSIS — N3010 Interstitial cystitis (chronic) without hematuria: Secondary | ICD-10-CM | POA: Diagnosis not present

## 2020-11-22 MED ORDER — MIRABEGRON ER 50 MG PO TB24: 50.0000 mg | ORAL_TABLET | Freq: Every day | ORAL | 11 refills | Status: DC

## 2020-11-22 NOTE — Progress Notes (Signed)
11/22/2020 9:15 AM   Savannah Gomez 01-16-1956 [id number]  Referring provider: Dale Union, MD [address] [place],  [place] [address]  Chief Complaint  Patient presents with   Nephrolithiasis    Urologic history: 1.  Interstitial cystitis - Imipramine 50 mg daily - Tolterodine 4 mg daily   2.  Left nephrolithiasis - 4 mm nonobstructing left lower pole calculus   HPI: 65 y.o. female presents for annual follow-up  Doing well from a urologic standpoint since her visit last year Stable pelvic symptoms on imipramine Has noted worsening urge incontinence on tolterodine Was previously on Myrbetriq which worked well however was not covered by her insurance plan several years ago No renal colic/stone symptoms Denies recurrent UTI KUB performed today was reviewed and there are no calcifications identified suspicious for urinary tract stones however there is a large amount of stool and bowel gas obscuring the renal outlines   PMH: Past Medical History:  Diagnosis Date   Allergy    Arthritis    Asthma    Chicken pox    Complication of anesthesia    GERD (gastroesophageal reflux disease)    History of kidney stones    Migraine headache    PONV (postoperative nausea and vomiting)    Urine incontinence     Surgical History: Past Surgical History:  Procedure Laterality Date   BREAST CYST ASPIRATION Bilateral    neg   carpal tunnel sugery     bilateral   CHOLECYSTECTOMY N/A 11/04/2019   Procedure: LAPAROSCOPIC CHOLECYSTECTOMY WITH INTRAOPERATIVE CHOLANGIOGRAM;  Surgeon: Earline Mayotte, MD;  Location: [facility] ORS;  Service: General;  Laterality: N/A;   COLONOSCOPY WITH PROPOFOL N/A 09/20/2018   Procedure: COLONOSCOPY WITH PROPOFOL;  Surgeon: Scot Jun, MD;  Location: [facility] ENDOSCOPY;  Service: Endoscopy;  Laterality: N/A;   ELBOW SURGERY  1999   ESOPHAGOGASTRODUODENOSCOPY (EGD) WITH PROPOFOL N/A 09/20/2018   Procedure:  ESOPHAGOGASTRODUODENOSCOPY (EGD) WITH PROPOFOL;  Surgeon: Scot Jun, MD;  Location: [facility] ENDOSCOPY;  Service: Endoscopy;  Laterality: N/A;   HAND SURGERY  1999   broken finger - pins   NASAL SINUS SURGERY  1992   ROTATOR CUFF REPAIR  1994   Trigger finger surgery  1999 and 2000   VAGINAL HYSTERECTOMY  1987   secondary to fibroids    Home Medications:  Allergies as of 11/22/2020       Reactions   Demerol [meperidine] Nausea Only        Medication List        Accurate as of November 22, 2020  9:15 AM. If you have any questions, ask your nurse or doctor.          acetaminophen 500 MG tablet Commonly known as: TYLENOL Take 1,000 mg by mouth daily as needed for mild pain or headache.   albuterol 108 (90 Base) MCG/ACT inhaler Commonly known as: VENTOLIN HFA Inhale 2 puffs into the lungs every 6 (six) hours as needed for wheezing or shortness of breath.   benzonatate 200 MG capsule Commonly known as: TESSALON Take 1 capsule (200 mg total) by mouth 3 (three) times daily as needed for cough.   cyclobenzaprine 5 MG tablet Commonly known as: FLEXERIL Take 5 mg by mouth daily as needed.   famotidine 20 MG tablet Commonly known as: PEPCID Take 1 tablet (20 mg total) by mouth 2 (two) times daily.   Flovent HFA 110 MCG/ACT inhaler Generic drug: fluticasone INHALE 2 PUFFS BY MOUTH INTO THE LUNGS  TWICE A DAY.   gabapentin 300 MG capsule Commonly known as: NEURONTIN Take 1 capsule by mouth 2 (two) times daily.   imipramine 25 MG tablet Commonly known as: TOFRANIL Take 1 tablet (25 mg total) by mouth 2 (two) times daily.   lubiprostone 24 MCG capsule Commonly known as: AMITIZA Take 1 capsule (24 mcg total) by mouth 2 (two) times daily with a meal.   pantoprazole 40 MG tablet Commonly known as: PROTONIX TAKE 1 TABLET BY MOUTH TWICE A DAY BEFORE A MEAL   STOOL SOFTENER PO Take 1 tablet by mouth daily.   sucralfate 1 GM/10ML suspension Commonly known as:  CARAFATE TAKE 10 ML (1 GRAMS TOTAL) BY MOUTH 4 TIMES DAILY   tiZANidine 2 MG tablet Commonly known as: ZANAFLEX Take 4 mg by mouth 2 (two) times daily.   tolterodine 4 MG 24 hr capsule Commonly known as: DETROL LA Take 1 capsule (4 mg total) by mouth daily.   topiramate 25 MG tablet Commonly known as: TOPAMAX TAKE 1 TABLET BY MOUTH TWICE A DAY   traMADol 50 MG tablet Commonly known as: ULTRAM Take 50 mg by mouth every 6 (six) hours as needed.        Allergies:  Allergies  Allergen Reactions   Demerol [Meperidine] Nausea Only    Family History: Family History  Problem Relation Age of Onset   Colon cancer Mother        colon   Asthma Mother    Heart disease Father    Hypertension Father    Colon cancer Sister    Stroke Maternal Grandfather    Diabetes Maternal Grandfather    Lung cancer Maternal Grandmother    Breast cancer Maternal Grandmother    Esophageal cancer Neg Hx    Pancreatic cancer Neg Hx    Liver disease Neg Hx    Stomach cancer Neg Hx     Social History:  reports that she has never smoked. She has never used smokeless tobacco. She reports that she does not drink alcohol and does not use drugs.   Physical Exam: BP 124/69   Pulse 79   Ht 5\' 3"  (1.6 m)   Wt 185 lb (83.9 kg)   BMI 32.77 kg/m   Constitutional:  Alert and oriented, No acute distress. HEENT: [place] AT, moist mucus membranes.  Trachea midline, no masses. Cardiovascular: No clubbing, cyanosis, or edema. Respiratory: Normal respiratory effort, no increased work of breathing.   Pertinent Imaging: KUB images personally reviewed and interpreted   Assessment & Plan:    1.  Chronic interstitial cystitis Imipramine refilled  2.  Urge incontinence We discussed that Myrbetriq insurance coverage has improved over the last several years and she was given 50 mg samples and Rx was sent to pharmacy  3.  Nephrolithiasis No suspicious calcifications seen on KUB  Continue annual  follow-up   , MD  [facility] [address] [place], [place] [phone]

## 2020-11-29 ENCOUNTER — Ambulatory Visit
Admission: RE | Admit: 2020-11-29 | Discharge: 2020-11-29 | Disposition: A | Discharge status: Home / Self Care | Payer: Medicare HMO | Source: Ambulatory Visit | Attending: Internal Medicine | Admitting: Internal Medicine

## 2020-11-29 ENCOUNTER — Other Ambulatory Visit: Payer: Self-pay

## 2020-11-29 DIAGNOSIS — Z1231 Encounter for screening mammogram for malignant neoplasm of breast: Secondary | ICD-10-CM | POA: Diagnosis not present

## 2021-01-15 ENCOUNTER — Other Ambulatory Visit: Payer: Self-pay | Admitting: Internal Medicine

## 2021-01-18 ENCOUNTER — Other Ambulatory Visit: Payer: Self-pay

## 2021-01-18 ENCOUNTER — Other Ambulatory Visit (INDEPENDENT_AMBULATORY_CARE_PROVIDER_SITE_OTHER): Payer: Medicare HMO

## 2021-01-18 DIAGNOSIS — E7800 Pure hypercholesterolemia, unspecified: Secondary | ICD-10-CM | POA: Diagnosis not present

## 2021-01-18 LAB — TSH: TSH: 2.62 u[IU]/mL (ref 0.35–5.50)

## 2021-01-18 LAB — BASIC METABOLIC PANEL
BUN: 17 mg/dL (ref 6–23)
CO2: 27 mEq/L (ref 19–32)
Calcium: 9.3 mg/dL (ref 8.4–10.5)
Chloride: 107 mEq/L (ref 96–112)
Creatinine, Ser: 0.95 mg/dL (ref 0.40–1.20)
GFR: 62.43 mL/min (ref >60.00)
Glucose, Bld: 99 mg/dL (ref 70–99)
Potassium: 4.4 mEq/L (ref 3.5–5.1)
Sodium: 141 mEq/L (ref 135–145)

## 2021-01-18 LAB — LIPID PANEL
Cholesterol: 200 mg/dL (ref 0–200)
HDL: 86.1 mg/dL (ref >39.00)
LDL Cholesterol: 100 mg/dL — ABNORMAL HIGH (ref 0–99)
NonHDL: 114.16
Total CHOL/HDL Ratio: 2
Triglycerides: 71 mg/dL (ref 0.0–149.0)
VLDL: 14.2 mg/dL (ref 0.0–40.0)

## 2021-01-18 LAB — HEPATIC FUNCTION PANEL
ALT: 19 U/L (ref 0–35)
AST: 19 U/L (ref 0–37)
Albumin: 4.2 g/dL (ref 3.5–5.2)
Alkaline Phosphatase: 109 U/L (ref 39–117)
Bilirubin, Direct: 0.1 mg/dL (ref 0.0–0.3)
Total Bilirubin: 0.4 mg/dL (ref 0.2–1.2)
Total Protein: 6.2 g/dL (ref 6.0–8.3)

## 2021-01-21 ENCOUNTER — Ambulatory Visit: Payer: Medicare HMO | Admitting: Internal Medicine

## 2021-01-22 ENCOUNTER — Other Ambulatory Visit: Payer: Self-pay

## 2021-01-22 ENCOUNTER — Ambulatory Visit (INDEPENDENT_AMBULATORY_CARE_PROVIDER_SITE_OTHER): Payer: Medicare HMO | Admitting: Internal Medicine

## 2021-01-22 VITALS — BP 118/70 | HR 85 | Temp 98.0°F | Resp 16 | Ht 63.0 in | Wt 196.0 lb

## 2021-01-22 DIAGNOSIS — R1084 Generalized abdominal pain: Secondary | ICD-10-CM | POA: Diagnosis not present

## 2021-01-22 DIAGNOSIS — E7800 Pure hypercholesterolemia, unspecified: Secondary | ICD-10-CM | POA: Diagnosis not present

## 2021-01-22 DIAGNOSIS — M5416 Radiculopathy, lumbar region: Secondary | ICD-10-CM | POA: Diagnosis not present

## 2021-01-22 DIAGNOSIS — J4520 Mild intermittent asthma, uncomplicated: Secondary | ICD-10-CM | POA: Diagnosis not present

## 2021-01-22 DIAGNOSIS — Z800 Family history of malignant neoplasm of digestive organs: Secondary | ICD-10-CM

## 2021-01-22 DIAGNOSIS — R748 Abnormal levels of other serum enzymes: Secondary | ICD-10-CM

## 2021-01-22 DIAGNOSIS — K219 Gastro-esophageal reflux disease without esophagitis: Secondary | ICD-10-CM

## 2021-01-22 DIAGNOSIS — Z23 Encounter for immunization: Secondary | ICD-10-CM | POA: Diagnosis not present

## 2021-01-22 NOTE — Progress Notes (Signed)
Patient ID: Savannah Gomez, female   DOB: 01/18/1956, 65 y.o.   MRN: [id number]   Subjective:    Patient ID: Savannah Gomez, female    DOB: 1955-07-19, 65 y.o.   MRN: [id number]  This visit occurred during the SARS-CoV-2 public health emergency.  Safety protocols were in place, including screening questions prior to the visit, additional usage of staff PPE, and extensive cleaning of exam room while observing appropriate contact time as indicated for disinfecting solutions.   Patient here for a scheduled follow up.   Chief Complaint  Patient presents with   Weight Gain   Hyperlipidemia   Gastroesophageal Reflux   .   HPI Just evaluated by GI - elevated alk phos.  Also f/u for acid reflux.  Recommended PPI bid.  Taking protonix.  Off amitiza, carafate.  Taking colon cleanse 3x/week.  Feels better.  She had flare after eating beef - recently.  Was questioning possible alpha gal allergy.  Has been seeing ortho for lumbar radiculopathy.  Prescribed tramadol, neurontin.  Recommended ESI.  No chest pain or sob reported.  Feels breathing stable.     Past Medical History:  Diagnosis Date   Allergy    Arthritis    Asthma    Chicken pox    Complication of anesthesia    GERD (gastroesophageal reflux disease)    History of kidney stones    Migraine headache    PONV (postoperative nausea and vomiting)    Urine incontinence    Past Surgical History:  Procedure Laterality Date   BREAST CYST ASPIRATION Bilateral    neg   carpal tunnel sugery     bilateral   CHOLECYSTECTOMY N/A 11/04/2019   Procedure: LAPAROSCOPIC CHOLECYSTECTOMY WITH INTRAOPERATIVE CHOLANGIOGRAM;  Surgeon: Robert Bellow, MD;  Location: [facility] ORS;  Service: General;  Laterality: N/A;   COLONOSCOPY WITH PROPOFOL N/A 09/20/2018   Procedure: COLONOSCOPY WITH PROPOFOL;  Surgeon: Manya Silvas, MD;  Location: [facility] ENDOSCOPY;  Service: Endoscopy;  Laterality: N/A;   ELBOW SURGERY  1999   ESOPHAGOGASTRODUODENOSCOPY (EGD)  WITH PROPOFOL N/A 09/20/2018   Procedure: ESOPHAGOGASTRODUODENOSCOPY (EGD) WITH PROPOFOL;  Surgeon: Manya Silvas, MD;  Location: [facility] ENDOSCOPY;  Service: Endoscopy;  Laterality: N/A;   HAND SURGERY  1999   broken finger - pins   NASAL SINUS SURGERY  1992   ROTATOR CUFF REPAIR  1994   Trigger finger surgery  1999 and 2000   VAGINAL HYSTERECTOMY  1987   secondary to fibroids   Family History  Problem Relation Age of Onset   Colon cancer Mother        colon   Asthma Mother    Heart disease Father    Hypertension Father    Colon cancer Sister    Stroke Maternal Grandfather    Diabetes Maternal Grandfather    Lung cancer Maternal Grandmother    Breast cancer Maternal Grandmother    Esophageal cancer Neg Hx    Pancreatic cancer Neg Hx    Liver disease Neg Hx    Stomach cancer Neg Hx    Social History   Socioeconomic History   Marital status: Married    Spouse name: Not on file   Number of children: Not on file   Years of education: Not on file   Highest education level: Not on file  Occupational History   Not on file  Tobacco Use   Smoking status: Never   Smokeless tobacco: Never  Vaping Use   Vaping Use:  Never used  Substance and Sexual Activity   Alcohol use: No   Drug use: No   Sexual activity: Yes    Birth control/protection: None, Post-menopausal  Other Topics Concern   Not on file  Social History Narrative   Not on file   Social Determinants of Health   Financial Resource Strain: Low Risk    Difficulty of Paying Living Expenses: Not hard at all  Food Insecurity: No Food Insecurity   Worried About Charity fundraiser in the Last Year: Never true   [place] in the Last Year: Never true  Transportation Needs: No Transportation Needs   Lack of Transportation (Medical): No   Lack of Transportation (Non-Medical): No  Physical Activity: Not on file  Stress: No Stress Concern Present   Feeling of Stress : Not at all  Social Connections: Unknown    Frequency of Communication with Friends and Family: More than three times a week   Frequency of Social Gatherings with Friends and Family: More than three times a week   Attends Religious Services: Not on file   Active Member of Clubs or Organizations: Not on file   Attends Archivist Meetings: Not on file   Marital Status: Not on file     Review of Systems  Constitutional:  Negative for appetite change and unexpected weight change.  HENT:  Negative for congestion and sinus pressure.   Respiratory:  Negative for cough, chest tightness and shortness of breath.   Cardiovascular:  Negative for chest pain, palpitations and leg swelling.  Gastrointestinal:  Negative for abdominal pain, diarrhea and vomiting.  Genitourinary:  Negative for difficulty urinating and dysuria.  Musculoskeletal:  Negative for joint swelling and myalgias.  Skin:  Negative for color change and rash.  Neurological:  Negative for dizziness, light-headedness and headaches.  Psychiatric/Behavioral:  Negative for agitation and dysphoric mood.       Objective:     BP 118/70   Pulse 85   Temp 98 F (36.7 C)   Resp 16   Ht 5' 3"  (1.6 m)   Wt 196 lb (88.9 kg)   SpO2 99%   BMI 34.72 kg/m  Wt Readings from Last 3 Encounters:  01/30/21 190 lb (86.2 kg)  01/22/21 196 lb (88.9 kg)  11/22/20 185 lb (83.9 kg)    Physical Exam Vitals reviewed.  Constitutional:      General: She is not in acute distress.    Appearance: Normal appearance.  HENT:     Head: Normocephalic and atraumatic.     Right Ear: External ear normal.     Left Ear: External ear normal.  Eyes:     General: No scleral icterus.       Right eye: No discharge.        Left eye: No discharge.     Conjunctiva/sclera: Conjunctivae normal.  Neck:     Thyroid: No thyromegaly.  Cardiovascular:     Rate and Rhythm: Normal rate and regular rhythm.  Pulmonary:     Effort: No respiratory distress.     Breath sounds: Normal breath sounds. No  wheezing.  Abdominal:     General: Bowel sounds are normal.     Palpations: Abdomen is soft.     Tenderness: There is no abdominal tenderness.  Musculoskeletal:        General: No swelling or tenderness.     Cervical back: Neck supple. No tenderness.  Lymphadenopathy:     Cervical: No cervical  adenopathy.  Skin:    Findings: No erythema or rash.  Neurological:     Mental Status: She is alert.  Psychiatric:        Mood and Affect: Mood normal.        Behavior: Behavior normal.     Outpatient Encounter Medications as of 01/22/2021  Medication Sig   acetaminophen (TYLENOL) 500 MG tablet Take 1,000 mg by mouth daily as needed for mild pain or headache.    albuterol (PROVENTIL HFA;VENTOLIN HFA) 108 (90 Base) MCG/ACT inhaler Inhale 2 puffs into the lungs every 6 (six) hours as needed for wheezing or shortness of breath.   benzonatate (TESSALON) 200 MG capsule Take 1 capsule (200 mg total) by mouth 3 (three) times daily as needed for cough.   cyclobenzaprine (FLEXERIL) 5 MG tablet Take 5 mg by mouth daily as needed.   famotidine (PEPCID) 20 MG tablet Take 1 tablet (20 mg total) by mouth 2 (two) times daily.   FLOVENT HFA 110 MCG/ACT inhaler INHALE 2 PUFFS BY MOUTH INTO THE LUNGS TWICE A DAY.   mirabegron ER (MYRBETRIQ) 50 MG TB24 tablet Take 1 tablet (50 mg total) by mouth daily.   pantoprazole (PROTONIX) 40 MG tablet TAKE 1 TABLET BY MOUTH TWICE A DAY BEFORE A MEAL   topiramate (TOPAMAX) 25 MG tablet TAKE 1 TABLET BY MOUTH TWICE A DAY   traMADol (ULTRAM) 50 MG tablet Take 50 mg by mouth every 6 (six) hours as needed.   [DISCONTINUED] Docusate Calcium (STOOL SOFTENER PO) Take 1 tablet by mouth daily.    [DISCONTINUED] gabapentin (NEURONTIN) 300 MG capsule Take 1 capsule by mouth 2 (two) times daily.   [DISCONTINUED] imipramine (TOFRANIL) 25 MG tablet Take 1 tablet (25 mg total) by mouth 2 (two) times daily.   [DISCONTINUED] lubiprostone (AMITIZA) 24 MCG capsule Take 1 capsule (24 mcg  total) by mouth 2 (two) times daily with a meal.   [DISCONTINUED] sucralfate (CARAFATE) 1 GM/10ML suspension TAKE 10 ML (1 GRAMS TOTAL) BY MOUTH 4 TIMES DAILY   [DISCONTINUED] tiZANidine (ZANAFLEX) 2 MG tablet Take 4 mg by mouth 2 (two) times daily.   [DISCONTINUED] tolterodine (DETROL LA) 4 MG 24 hr capsule Take 1 capsule (4 mg total) by mouth daily.   No facility-administered encounter medications on file as of 01/22/2021.     Lab Results  Component Value Date   WBC 4.4 09/14/2020   HGB 13.9 09/14/2020   HCT 41.6 09/14/2020   PLT 251.0 09/14/2020   GLUCOSE 99 01/18/2021   CHOL 200 01/18/2021   TRIG 71.0 01/18/2021   HDL 86.10 01/18/2021   LDLCALC 100 (H) 01/18/2021   ALT 19 01/18/2021   AST 19 01/18/2021   NA 141 01/18/2021   K 4.4 01/18/2021   CL 107 01/18/2021   CREATININE 0.95 01/18/2021   BUN 17 01/18/2021   CO2 27 01/18/2021   TSH 2.62 01/18/2021   HGBA1C 5.4 10/03/2019    Mammogram 3D SCREEN BREAST BILATERAL  Result Date: 11/30/2020 CLINICAL DATA:  Screening. EXAM: DIGITAL SCREENING BILATERAL MAMMOGRAM WITH TOMOSYNTHESIS AND CAD TECHNIQUE: Bilateral screening digital craniocaudal and mediolateral oblique mammograms were obtained. Bilateral screening digital breast tomosynthesis was performed. The images were evaluated with computer-aided detection. COMPARISON:  Previous exam(s). ACR Breast Density Category b: There are scattered areas of fibroglandular density. FINDINGS: There are no findings suspicious for malignancy. IMPRESSION: No mammographic evidence of malignancy. A result letter of this screening mammogram will be mailed directly to the patient. RECOMMENDATION: Screening mammogram in  one year. (Code:SM-B-01Y) BI-RADS CATEGORY  1: Negative. Electronically Signed   By: Franki Cabot M.D.   On: 11/30/2020 13:32      Assessment & Plan:   Problem List Items Addressed This Visit     Abdominal pain    Previous intermittent abdominal pain.  Colon cleanse 3x/week.  Feels  better.  Seeing GI.  PPI bid.  Request alphagal panel.  Follow.  Colonoscopy 2020.       Relevant Orders   Alpha-Gal Panel (Completed)   Asthma    Breathing stable.  Has rescue inhaler if needed.  Chest x-ray November 2021 revealed no acute abnormality.      Elevated alkaline phosphatase level    Saw GI.  No biliary ductal dilatation noted on ultrasound, CT or MRI.  Follow liver panel.      Family history of colon cancer    Reviewed.  Colonoscopy 2020. Due 2025.        GERD (gastroesophageal reflux disease)    EGD February 2022-gastritis.  Continue proton pump inhibitor and Pepcid.  Carafate added.  Continues to follow-up with GI (Dr. Tarri Glenn).      Hypercholesteremia - Primary    The 10-year ASCVD risk score (Arnett DK, et al., 2019) is: 4.5%   Values used to calculate the score:     Age: 60 years     Sex: Female     Is Non-Hispanic African American: No     Diabetic: No     Tobacco smoker: No     Systolic Blood Pressure: 336 mmHg     Is BP treated: No     HDL Cholesterol: 86.1 mg/dL     Total Cholesterol: 200 mg/dL  Low cholesterol diet and exercise.  Follow lipid panel.       Relevant Orders   Basic metabolic panel   Hepatic function panel   Lipid panel   Lumbar radiculopathy    Followed by ortho.       Other Visit Diagnoses     Need for immunization against influenza       Relevant Orders   Flu Vaccine QUAD High Dose(Fluad) (Completed)        Einar Pheasant, MD

## 2021-01-22 NOTE — Assessment & Plan Note (Addendum)
The 10-year ASCVD risk score (Arnett DK, et al., 2019) is: 4.5%   Values used to calculate the score:     Age: 65 years     Sex: Female     Is Non-Hispanic African American: No     Diabetic: No     Tobacco smoker: No     Systolic Blood Pressure: 118 mmHg     Is BP treated: No     HDL Cholesterol: 86.1 mg/dL     Total Cholesterol: 200 mg/dL  Low cholesterol diet and exercise.  Follow lipid panel.

## 2021-01-25 LAB — ALPHA-GAL PANEL
Beef IgE: <0.1 kU/L (ref <0.35)
Class: 0
Class: 0
Class: 0
Galactose-alpha-1,3-galactose IgE: <0.1 kU/L (ref <0.10)
LAMB/MUTTON IGE: <0.1 kU/L (ref <0.35)
Pork IgE: <0.1 kU/L (ref <0.35)

## 2021-01-30 ENCOUNTER — Ambulatory Visit
Admission: RE | Admit: 2021-01-30 | Discharge: 2021-01-30 | Disposition: A | Discharge status: Home / Self Care | Payer: Medicare HMO | Source: Ambulatory Visit | Attending: Urology | Admitting: Urology

## 2021-01-30 ENCOUNTER — Ambulatory Visit: Payer: Medicare HMO | Admitting: Urology

## 2021-01-30 ENCOUNTER — Other Ambulatory Visit: Payer: Self-pay

## 2021-01-30 VITALS — BP 147/71 | HR 80 | Ht 63.0 in | Wt 190.0 lb

## 2021-01-30 DIAGNOSIS — M47817 Spondylosis without myelopathy or radiculopathy, lumbosacral region: Secondary | ICD-10-CM | POA: Diagnosis not present

## 2021-01-30 DIAGNOSIS — N200 Calculus of kidney: Secondary | ICD-10-CM | POA: Diagnosis not present

## 2021-01-30 DIAGNOSIS — R109 Unspecified abdominal pain: Secondary | ICD-10-CM | POA: Diagnosis not present

## 2021-01-30 DIAGNOSIS — R300 Dysuria: Secondary | ICD-10-CM | POA: Diagnosis not present

## 2021-01-30 DIAGNOSIS — R350 Frequency of micturition: Secondary | ICD-10-CM | POA: Diagnosis not present

## 2021-01-30 MED ORDER — NITROFURANTOIN MONOHYD MACRO 100 MG PO CAPS
100.0000 mg | ORAL_CAPSULE | Freq: Two times a day (BID) | ORAL | 0 refills | Status: DC
Start: 2021-01-30 — End: 2021-03-01

## 2021-01-30 MED ORDER — TOLTERODINE TARTRATE ER 4 MG PO CP24: 4.0000 mg | ORAL_CAPSULE | Freq: Every day | ORAL | 3 refills | Status: DC

## 2021-01-30 MED ORDER — IMIPRAMINE HCL 25 MG PO TABS: 25.0000 mg | ORAL_TABLET | Freq: Two times a day (BID) | ORAL | 3 refills | Status: DC

## 2021-01-30 NOTE — Progress Notes (Signed)
01/30/2021 8:45 PM   Savannah Gomez 06-17-1955 [id number]  Referring provider: Dale [place], MD [address] [place],  [place] [address]  Chief Complaint  Patient presents with   Dysuria   Urinary Frequency   Urologic history: 1.  Interstitial cystitis - Imipramine 50 mg daily - Tolterodine 4 mg daily   2.  Left nephrolithiasis - 4 mm nonobstructing left lower pole calculus  3. Urge incontinence -contributing factors of age, vaginal atrophy and muscle relaxer's -managed with Myrbetriq 50 mg daily    HPI: Savannah Gomez is a 65 y.o. female who presents today for frequency and burning.   UA 11-30 WBC's, 3-10 RBC's and few bacteria.    KUB negative for stones.   Since last night, she has had worsening of her frequency, dysuria and blood on toilet paper after wiping.    Patient denies any modifying or aggravating factors.  Patient denies suprapubic/flank pain.  Patient denies any fevers, chills, nausea or vomiting.    PMH: Past Medical History:  Diagnosis Date   Allergy    Arthritis    Asthma    Chicken pox    Complication of anesthesia    GERD (gastroesophageal reflux disease)    History of kidney stones    Migraine headache    PONV (postoperative nausea and vomiting)    Urine incontinence     Surgical History: Past Surgical History:  Procedure Laterality Date   BREAST CYST ASPIRATION Bilateral    neg   carpal tunnel sugery     bilateral   CHOLECYSTECTOMY N/A 11/04/2019   Procedure: LAPAROSCOPIC CHOLECYSTECTOMY WITH INTRAOPERATIVE CHOLANGIOGRAM;  Surgeon: Earline Mayotte, MD;  Location: [facility] ORS;  Service: General;  Laterality: N/A;   COLONOSCOPY WITH PROPOFOL N/A 09/20/2018   Procedure: COLONOSCOPY WITH PROPOFOL;  Surgeon: Scot Jun, MD;  Location: [facility] ENDOSCOPY;  Service: Endoscopy;  Laterality: N/A;   ELBOW SURGERY  1999   ESOPHAGOGASTRODUODENOSCOPY (EGD) WITH PROPOFOL N/A 09/20/2018   Procedure:  ESOPHAGOGASTRODUODENOSCOPY (EGD) WITH PROPOFOL;  Surgeon: Scot Jun, MD;  Location: [facility] ENDOSCOPY;  Service: Endoscopy;  Laterality: N/A;   HAND SURGERY  1999   broken finger - pins   NASAL SINUS SURGERY  1992   ROTATOR CUFF REPAIR  1994   Trigger finger surgery  1999 and 2000   VAGINAL HYSTERECTOMY  1987   secondary to fibroids    Home Medications:  Allergies as of 01/30/2021       Reactions   Demerol [meperidine] Nausea Only        Medication List        Accurate as of January 30, 2021 11:59 PM. If you have any questions, ask your nurse or doctor.          acetaminophen 500 MG tablet Commonly known as: TYLENOL Take 1,000 mg by mouth daily as needed for mild pain or headache.   albuterol 108 (90 Base) MCG/ACT inhaler Commonly known as: VENTOLIN HFA Inhale 2 puffs into the lungs every 6 (six) hours as needed for wheezing or shortness of breath.   benzonatate 200 MG capsule Commonly known as: TESSALON Take 1 capsule (200 mg total) by mouth 3 (three) times daily as needed for cough.   cyclobenzaprine 5 MG tablet Commonly known as: FLEXERIL Take 5 mg by mouth daily as needed.   famotidine 20 MG tablet Commonly known as: PEPCID Take 1 tablet (20 mg total) by mouth 2 (two) times daily.   Flovent HFA 110  MCG/ACT inhaler Generic drug: fluticasone INHALE 2 PUFFS BY MOUTH INTO THE LUNGS TWICE A DAY.   imipramine 25 MG tablet Commonly known as: TOFRANIL Take 1 tablet (25 mg total) by mouth 2 (two) times daily.   mirabegron ER 50 MG Tb24 tablet Commonly known as: MYRBETRIQ Take 1 tablet (50 mg total) by mouth daily.   nitrofurantoin (macrocrystal-monohydrate) 100 MG capsule Commonly known as: MACROBID Take 1 capsule (100 mg total) by mouth every 12 (twelve) hours. Started by: Zara Council, PA-C   pantoprazole 40 MG tablet Commonly known as: PROTONIX TAKE 1 TABLET BY MOUTH TWICE A DAY BEFORE A MEAL   tolterodine 4 MG 24 hr capsule Commonly known  as: DETROL LA Take 1 capsule (4 mg total) by mouth daily. Started by: Zara Council, PA-C   topiramate 25 MG tablet Commonly known as: TOPAMAX TAKE 1 TABLET BY MOUTH TWICE A DAY   traMADol 50 MG tablet Commonly known as: ULTRAM Take 50 mg by mouth every 6 (six) hours as needed.        Allergies:  Allergies  Allergen Reactions   Demerol [Meperidine] Nausea Only    Family History: Family History  Problem Relation Age of Onset   Colon cancer Mother        colon   Asthma Mother    Heart disease Father    Hypertension Father    Colon cancer Sister    Stroke Maternal Grandfather    Diabetes Maternal Grandfather    Lung cancer Maternal Grandmother    Breast cancer Maternal Grandmother    Esophageal cancer Neg Hx    Pancreatic cancer Neg Hx    Liver disease Neg Hx    Stomach cancer Neg Hx     Social History:  reports that she has never smoked. She has never used smokeless tobacco. She reports that she does not drink alcohol and does not use drugs.  ROS: Pertinent ROS in HPI  Physical Exam: BP (!) 147/71   Pulse 80   Ht 5\' 3"  (1.6 m)   Wt 190 lb (86.2 kg)   BMI 33.66 kg/m   Constitutional:  Well nourished. Alert and oriented, No acute distress. HEENT:  AT, mask in place.  Trachea midline Cardiovascular: No clubbing, cyanosis, or edema. Respiratory: Normal respiratory effort, no increased work of breathing. GI: Abdomen is soft, non tender, non distended, no abdominal masses. Liver and spleen not palpable.  No hernias appreciated.  Stool sample for occult testing is not indicated.   Neurologic: Grossly intact, no focal deficits, moving all 4 extremities. Psychiatric: Normal mood and affect.    Laboratory Data: Lab Results  Component Value Date   WBC 4.4 09/14/2020   HGB 13.9 09/14/2020   HCT 41.6 09/14/2020   MCV 84.6 09/14/2020   PLT 251.0 09/14/2020    Lab Results  Component Value Date   CREATININE 0.95 01/18/2021     Lab Results  Component  Value Date   TSH 2.62 01/18/2021       Component Value Date/Time   CHOL 200 01/18/2021 0853   HDL 86.10 01/18/2021 0853   CHOLHDL 2 01/18/2021 0853   VLDL 14.2 01/18/2021 0853   LDLCALC 100 (H) 01/18/2021 0853    Lab Results  Component Value Date   AST 19 01/18/2021   Lab Results  Component Value Date   ALT 19 01/18/2021    Urinalysis Component     Latest Ref Rng & Units 01/30/2021  Specific Gravity, UA  1.005 - 1.030 1.010  pH, UA     5.0 - 7.5 6.0  Color, UA     Yellow Straw  Appearance Ur     Clear Clear  Leukocytes,UA     Negative Trace (A)  Protein,UA     Negative/Trace Negative  Glucose, UA     Negative Negative  Ketones, UA     Negative Negative  RBC, UA     Negative 2+ (A)  Bilirubin, UA     Negative Negative  Urobilinogen, Ur     0.2 - 1.0 mg/dL 0.2  Nitrite, UA     Negative Negative  Microscopic Examination      See below:   Component     Latest Ref Rng & Units 01/30/2021  WBC, UA     0 - 5 /hpf 11-30 (A)  RBC     0 - 2 /hpf 3-10 (A)  Epithelial Cells (non renal)     0 - 10 /hpf 0-10  Bacteria, UA     None seen/Few Few (A)  I have reviewed the labs.   Pertinent Imaging: CLINICAL DATA:  Flank pain   EXAM: ABDOMEN - 1 VIEW   COMPARISON:  11/22/2020   FINDINGS: Nonobstructed gas pattern. No radiopaque calculi are visualized. Moderate stool in the colon.   IMPRESSION: Negative.     Electronically Signed   By: Donavan Foil M.D.   On: 01/31/2021 23:46  I have independently reviewed the films.  See HPI.    Assessment & Plan:    1. Frequency -UA suspicious for UTI -Urine sent for culture -start Macrobid 100 mg, BID x 7 days  2. Microscopic hematuria -UA 3-10 RBC's -will recheck UA in one month to ensure micro heme resolves with the treatment of infection  -if urine culture is negative, will need to pursue hematuria work up   3. Left nephrolithiasis -stone not visualized on today's KUB  Return in about 1 month  (around 03/01/2021) for UA and symptom recheck .  These notes generated with voice recognition software. I apologize for typographical errors.  Zara Council, PA-C  [facility] [address]  [place], [place] [address] [phone]

## 2021-01-31 LAB — URINALYSIS, COMPLETE
Bilirubin, UA: NEGATIVE
Glucose, UA: NEGATIVE
Ketones, UA: NEGATIVE
Nitrite, UA: NEGATIVE
Protein,UA: NEGATIVE
Specific Gravity, UA: 1.01 (ref 1.005–1.030)
Urobilinogen, Ur: 0.2 mg/dL (ref 0.2–1.0)
pH, UA: 6 (ref 5.0–7.5)

## 2021-01-31 LAB — MICROSCOPIC EXAMINATION

## 2021-02-02 ENCOUNTER — Encounter: Payer: Self-pay | Admitting: Internal Medicine

## 2021-02-02 DIAGNOSIS — M5416 Radiculopathy, lumbar region: Secondary | ICD-10-CM | POA: Insufficient documentation

## 2021-02-02 LAB — CULTURE, URINE COMPREHENSIVE

## 2021-02-02 NOTE — Assessment & Plan Note (Signed)
Followed by ortho 

## 2021-02-02 NOTE — Assessment & Plan Note (Signed)
EGD February 2022-gastritis.  Continue proton pump inhibitor and Pepcid.  Carafate added.  Continues to follow-up with GI (Dr. Orvan Falconer).

## 2021-02-02 NOTE — Assessment & Plan Note (Signed)
Saw GI.  No biliary ductal dilatation noted on ultrasound, CT or MRI.  Follow liver panel.

## 2021-02-02 NOTE — Assessment & Plan Note (Signed)
Reviewed.  Colonoscopy 2020. Due 2025.   

## 2021-02-02 NOTE — Assessment & Plan Note (Signed)
Previous intermittent abdominal pain.  Colon cleanse 3x/week.  Feels better.  Seeing GI.  PPI bid.  Request alphagal panel.  Follow.  Colonoscopy 2020.

## 2021-02-02 NOTE — Assessment & Plan Note (Signed)
Breathing stable.  Has rescue inhaler if needed.  Chest x-ray November 2021 revealed no acute abnormality.

## 2021-02-10 ENCOUNTER — Telehealth: Payer: Self-pay | Admitting: Urology

## 2021-02-10 NOTE — Telephone Encounter (Signed)
Please let Savannah Gomez know that we received a letter from her insurance company stating that they are denying coverage of her imipramine due to it is not being prescribed for depression or neuropathic pain.  The medication is available for $27.00 for 90 tablets at [facility] through [facility] or for $13.00 for 90 tablets at [facility] through [facility].   Would she like to take advantage of either of these coupons.

## 2021-02-18 NOTE — Telephone Encounter (Signed)
Spoke with patient and she already filled med thru her pharmacy for cash price.

## 2021-02-18 NOTE — Telephone Encounter (Signed)
.  left message to have patient return my call.  

## 2021-02-24 ENCOUNTER — Encounter: Payer: Self-pay | Admitting: Urology

## 2021-02-28 ENCOUNTER — Ambulatory Visit: Payer: Medicare HMO | Admitting: Internal Medicine

## 2021-02-28 ENCOUNTER — Other Ambulatory Visit: Payer: Self-pay

## 2021-02-28 ENCOUNTER — Encounter: Payer: Self-pay | Admitting: Internal Medicine

## 2021-02-28 VITALS — BP 130/78 | HR 77 | Temp 97.9°F | Ht 63.0 in | Wt 190.0 lb

## 2021-02-28 DIAGNOSIS — J209 Acute bronchitis, unspecified: Secondary | ICD-10-CM

## 2021-02-28 DIAGNOSIS — J4520 Mild intermittent asthma, uncomplicated: Secondary | ICD-10-CM | POA: Diagnosis not present

## 2021-02-28 DIAGNOSIS — J4521 Mild intermittent asthma with (acute) exacerbation: Secondary | ICD-10-CM

## 2021-02-28 DIAGNOSIS — J440 Chronic obstructive pulmonary disease with acute lower respiratory infection: Secondary | ICD-10-CM | POA: Diagnosis not present

## 2021-02-28 MED ORDER — FLUTICASONE PROPIONATE HFA 110 MCG/ACT IN AERO: INHALATION_SPRAY | RESPIRATORY_TRACT | 10 refills | Status: DC

## 2021-02-28 MED ORDER — ALBUTEROL SULFATE HFA 108 (90 BASE) MCG/ACT IN AERS: 2.0000 | INHALATION_SPRAY | Freq: Four times a day (QID) | RESPIRATORY_TRACT | 10 refills | Status: DC | PRN

## 2021-02-28 NOTE — Progress Notes (Signed)
03/01/2021 10:30 AM   Savannah Neighborsynthia H Kildow [birth year] [id number]  Referring provider: Dale DurhamScott, Charlene, MD [address] [place] [id number]  Chief Complaint  Patient presents with   Urinary Frequency    Urologic history: 1.  Interstitial cystitis - Imipramine 50 mg daily-insurance no longer covers paying out-of-pocket - Tolterodine 4 mg daily   2.  Left nephrolithiasis - 4 mm nonobstructing left lower pole calculus  3. Urge incontinence -contributing factors of age, vaginal atrophy and muscle relaxer's -managed with Myrbetriq 50 mg daily    HPI: Savannah NeighborsCynthia H Gomez is a 65 y.o. female who presents today for one month follow up.   UA negative for micro heme     At her last visit on 01/30/2021, she was experiencing frequency, dysuria and blood on toilet paper after wiping.  Her urine culture was positive for E.coli.  She is feeling back to normal.  Patient denies any modifying or aggravating factors.  Patient denies any gross hematuria, dysuria or suprapubic/flank pain.  Patient denies any fevers, chills, nausea or vomiting.     PMH: Past Medical History:  Diagnosis Date   Allergy    Arthritis    Asthma    Chicken pox    Complication of anesthesia    GERD (gastroesophageal reflux disease)    History of kidney stones    Migraine headache    PONV (postoperative nausea and vomiting)    Urine incontinence     Surgical History: Past Surgical History:  Procedure Laterality Date   BREAST CYST ASPIRATION Bilateral    neg   carpal tunnel sugery     bilateral   CHOLECYSTECTOMY N/A 11/04/2019   Procedure: LAPAROSCOPIC CHOLECYSTECTOMY WITH INTRAOPERATIVE CHOLANGIOGRAM;  Surgeon: Earline MayotteByrnett, Jeffrey W, MD;  Location: [facility] ORS;  Service: General;  Laterality: N/A;   COLONOSCOPY WITH PROPOFOL N/A 09/20/2018   Procedure: COLONOSCOPY WITH PROPOFOL;  Surgeon: Scot JunElliott, Robert T, MD;  [facility];  Service: Endoscopy;  Laterality: N/A;    ELBOW SURGERY  1999   ESOPHAGOGASTRODUODENOSCOPY (EGD) WITH PROPOFOL N/A 09/20/2018   Procedure: ESOPHAGOGASTRODUODENOSCOPY (EGD) WITH PROPOFOL;  Surgeon: Scot JunElliott, Robert T, MD;  [facility];  Service: Endoscopy;  Laterality: N/A;   HAND SURGERY  1999   broken finger - pins   NASAL SINUS SURGERY  1992   ROTATOR CUFF REPAIR  1994   Trigger finger surgery  1999 and 2000   VAGINAL HYSTERECTOMY  1987   secondary to fibroids    Home Medications:  Allergies as of 03/01/2021       Reactions   Demerol [meperidine] Nausea Only        Medication List        Accurate as of March 01, 2021 11:59 PM. If you have any questions, ask your nurse or doctor.          STOP taking these medications    cyclobenzaprine 5 MG tablet Commonly known as: FLEXERIL Stopped by: Alfie Rideaux, PA-C   nitrofurantoin (macrocrystal-monohydrate) 100 MG capsule Commonly known as: MACROBID Stopped by: Michiel CowboySHANNON Shaianne Nucci, PA-C       TAKE these medications    acetaminophen 500 MG tablet Commonly known as: TYLENOL Take 1,000 mg by mouth daily as needed for mild pain or headache.   albuterol 108 (90 Base) MCG/ACT inhaler Commonly known as: VENTOLIN HFA Inhale 2 puffs into the lungs every 6 (six) hours as needed for wheezing or shortness of breath.   benzonatate 200 MG capsule Commonly known  as: TESSALON Take 1 capsule (200 mg total) by mouth 3 (three) times daily as needed for cough.   famotidine 20 MG tablet Commonly known as: PEPCID Take 1 tablet (20 mg total) by mouth 2 (two) times daily.   fluticasone 110 MCG/ACT inhaler Commonly known as: Flovent HFA INHALE 2 PUFFS BY MOUTH INTO THE LUNGS TWICE A DAY.   imipramine 25 MG tablet Commonly known as: TOFRANIL Take 1 tablet (25 mg total) by mouth 2 (two) times daily.   pantoprazole 40 MG tablet Commonly known as: PROTONIX TAKE 1 TABLET BY MOUTH TWICE A DAY BEFORE A MEAL   tolterodine 4 MG 24 hr capsule Commonly known as:  DETROL LA Take 1 capsule (4 mg total) by mouth daily.   topiramate 25 MG tablet Commonly known as: TOPAMAX TAKE 1 TABLET BY MOUTH TWICE A DAY   traMADol 50 MG tablet Commonly known as: ULTRAM Take 50 mg by mouth every 6 (six) hours as needed.        Allergies:  Allergies  Allergen Reactions   Demerol [Meperidine] Nausea Only    Family History: Family History  Problem Relation Age of Onset   Colon cancer Mother        colon   Asthma Mother    Heart disease Father    Hypertension Father    Colon cancer Sister    Stroke Maternal Grandfather    Diabetes Maternal Grandfather    Lung cancer Maternal Grandmother    Breast cancer Maternal Grandmother    Esophageal cancer Neg Hx    Pancreatic cancer Neg Hx    Liver disease Neg Hx    Stomach cancer Neg Hx     Social History:  reports that she has never smoked. She has never used smokeless tobacco. She reports that she does not drink alcohol and does not use drugs.  ROS: Pertinent ROS in HPI  Physical Exam: BP (!) 142/84   Pulse 77   Ht 5\' 3"  (1.6 m)   Wt 190 lb (86.2 kg)   BMI 33.66 kg/m   Constitutional:  Well nourished. Alert and oriented, No acute distress. HEENT: [place] AT, mask in place.  Trachea midline Cardiovascular: No clubbing, cyanosis, or edema. Respiratory: Normal respiratory effort, no increased work of breathing. Neurologic: Grossly intact, no focal deficits, moving all 4 extremities. Psychiatric: Normal mood and affect.    Laboratory Data: Urinalysis Component     Latest Ref Rng & Units 03/01/2021  Specific Gravity, UA     1.005 - 1.030 1.010  pH, UA     5.0 - 7.5 5.5  Color, UA     Yellow Yellow  Appearance Ur     Clear Clear  Leukocytes,UA     Negative Negative  Protein,UA     Negative/Trace Negative  Glucose, UA     Negative Negative  Ketones, UA     Negative Negative  RBC, UA     Negative Negative  Bilirubin, UA     Negative Negative  Urobilinogen, Ur     0.2 - 1.0 mg/dL 0.2   Nitrite, UA     Negative Negative  Microscopic Examination      See below:   Component     Latest Ref Rng & Units 03/01/2021  WBC, UA     0 - 5 /hpf None seen  RBC     0 - 2 /hpf 0-2  Epithelial Cells (non renal)     0 - 10 /hpf 0-10  Bacteria,  UA     None seen/Few None seen  I have reviewed the labs.   Pertinent Imaging:  N/A  Assessment & Plan:    1. Frequency -Secondary to UTI, now back at baseline  2. Microscopic hematuria -resolved  3. Left nephrolithiasis -stone not visualized on today's KUB  Return for keep follow up with Dr. Lonna Cobb in September .  These notes generated with voice recognition software. I apologize for typographical errors.  Michiel Cowboy, PA-C  [facility] [address]  [address] [place], [place] [address] [phone]

## 2021-02-28 NOTE — Progress Notes (Signed)
[facility] Pulmonary Medicine Consultation     MRN# [id number] Savannah Gomez September 21, 1955  Brief History: 65 yo with Hx of Asthma (positive MCH), stopped meds, and presented back to [place] with chronic cough, now on Arnuity with significant improvement.  Dx with ASTHMA 15 years ago Pneumonia 2 years ago Triggers-pollen, cats,dogs  CC  Follow-up asthma    HPI  Mild intermittent cough and wheezing  Steroid responsive asthma   No exacerbation at this time No evidence of heart failure at this time No evidence or signs of infection at this time No respiratory distress No fevers, chills, nausea, vomiting, diarrhea No evidence of lower extremity edema No evidence hemoptysis   Triggers seasonal, smoke, exercise, cats/dogs Carpet in bedrooms   Current Outpatient Medications:    acetaminophen (TYLENOL) 500 MG tablet, Take 1,000 mg by mouth daily as needed for mild pain or headache. , Disp: , Rfl:    albuterol (PROVENTIL HFA;VENTOLIN HFA) 108 (90 Base) MCG/ACT inhaler, Inhale 2 puffs into the lungs every 6 (six) hours as needed for wheezing or shortness of breath., Disp: 1 Inhaler, Rfl: 5   benzonatate (TESSALON) 200 MG capsule, Take 1 capsule (200 mg total) by mouth 3 (three) times daily as needed for cough., Disp: 30 capsule, Rfl: 1   cyclobenzaprine (FLEXERIL) 5 MG tablet, Take 5 mg by mouth daily as needed., Disp: , Rfl:    famotidine (PEPCID) 20 MG tablet, Take 1 tablet (20 mg total) by mouth 2 (two) times daily., Disp: 60 tablet, Rfl: 3   FLOVENT HFA 110 MCG/ACT inhaler, INHALE 2 PUFFS BY MOUTH INTO THE LUNGS TWICE A DAY., Disp: 12 g, Rfl: 2   imipramine (TOFRANIL) 25 MG tablet, Take 1 tablet (25 mg total) by mouth 2 (two) times daily., Disp: 180 tablet, Rfl: 3   nitrofurantoin, macrocrystal-monohydrate, (MACROBID) 100 MG capsule, Take 1 capsule (100 mg total) by mouth every 12 (twelve) hours., Disp: 14 capsule, Rfl: 0   pantoprazole (PROTONIX) 40 MG tablet, TAKE 1 TABLET  BY MOUTH TWICE A DAY BEFORE A MEAL, Disp: 180 tablet, Rfl: 1   tolterodine (DETROL LA) 4 MG 24 hr capsule, Take 1 capsule (4 mg total) by mouth daily., Disp: 90 capsule, Rfl: 3   topiramate (TOPAMAX) 25 MG tablet, TAKE 1 TABLET BY MOUTH TWICE A DAY, Disp: 180 tablet, Rfl: 1   traMADol (ULTRAM) 50 MG tablet, Take 50 mg by mouth every 6 (six) hours as needed., Disp: , Rfl:    Review of Systems:  Gen:  Denies  fever, sweats, chills weight loss  HEENT: Denies blurred vision, double vision, ear pain, eye pain, hearing loss, nose bleeds, sore throat Cardiac:  No dizziness, chest pain or heaviness, chest tightness,edema, No JVD Resp:   No cough, -sputum production, -shortness of breath,-wheezing, -hemoptysis,  Other:  All other systems negative   BP 130/78 (BP Location: Left Arm, Patient Position: Sitting, Cuff Size: Normal)   Pulse 77   Temp 97.9 F (36.6 C) (Oral)   Ht 5\' 3"  (1.6 m)   Wt 190 lb (86.2 kg)   SpO2 100%   BMI 33.66 kg/m   Physical Examination:   General Appearance: No distress  EYES PERRLA, EOM intact.   NECK Supple, No JVD Pulmonary: normal breath sounds, No wheezing.  CardiovascularNormal S1,S2.  No m/r/g.   ALL OTHER ROS ARE NEGATIVE    Pulmonary function test 12/11/2015 FEV1 92% FEV1/FVC 80% RV 106% TLC 102% DLCO corrected and her 42% Impression: Nonobstructive process by spirometry,  no significant response to bronchodilators. Mild scooping of end expiratory curve, consistent with a mild obstructive process. Overall fits the clinical diagnosis of asthma.  6 minute walk test total distance 1240 feet/378 m low saturation 99%, highest heart rate 78    Assessment and Plan:  Mild intermittent asthma  Well-controlled  continue inhaled steroids  Flovent HFA 110 Albuterol  as needed Avoid triggers  Allergic rhinitis Continue zyrtec and intranasal Flonase Working well symptoms under control  GERD under control     MEDICATION ADJUSTMENTS/LABS AND  TESTS ORDERED: Continue Flovent as prescribed Avoid allergens   CURRENT MEDICATIONS REVIEWED AT LENGTH WITH PATIENT TODAY   Patient satisfied with Plan of action and management. All questions answered Follow-up 1 year  Total time spent 21 minutes  Savannah Gomez Santiago Glad, M.D.  [facility]  Medical Director [facility] Medical Director [facility] Cardio-Pulmonary Department

## 2021-02-28 NOTE — Patient Instructions (Signed)
Continue Flovent Refill albuterol  Avoid sick contacts Avoid allergens Avoid secondhand smoke

## 2021-03-01 ENCOUNTER — Encounter: Payer: Self-pay | Admitting: Urology

## 2021-03-01 ENCOUNTER — Ambulatory Visit: Payer: Medicare HMO | Admitting: Urology

## 2021-03-01 VITALS — BP 142/84 | HR 77 | Ht 63.0 in | Wt 190.0 lb

## 2021-03-01 DIAGNOSIS — N390 Urinary tract infection, site not specified: Secondary | ICD-10-CM

## 2021-03-01 DIAGNOSIS — R3129 Other microscopic hematuria: Secondary | ICD-10-CM | POA: Diagnosis not present

## 2021-03-01 DIAGNOSIS — R319 Hematuria, unspecified: Secondary | ICD-10-CM

## 2021-03-01 DIAGNOSIS — M47817 Spondylosis without myelopathy or radiculopathy, lumbosacral region: Secondary | ICD-10-CM | POA: Diagnosis not present

## 2021-03-01 LAB — URINALYSIS, COMPLETE
Bilirubin, UA: NEGATIVE
Glucose, UA: NEGATIVE
Ketones, UA: NEGATIVE
Leukocytes,UA: NEGATIVE
Nitrite, UA: NEGATIVE
Protein,UA: NEGATIVE
RBC, UA: NEGATIVE
Specific Gravity, UA: 1.01 (ref 1.005–1.030)
Urobilinogen, Ur: 0.2 mg/dL (ref 0.2–1.0)
pH, UA: 5.5 (ref 5.0–7.5)

## 2021-03-01 LAB — MICROSCOPIC EXAMINATION
Bacteria, UA: NONE SEEN
WBC, UA: NONE SEEN /hpf (ref 0–5)

## 2021-05-20 ENCOUNTER — Ambulatory Visit: Payer: Medicare HMO | Admitting: Gastroenterology

## 2021-05-20 ENCOUNTER — Other Ambulatory Visit (INDEPENDENT_AMBULATORY_CARE_PROVIDER_SITE_OTHER): Payer: Medicare HMO

## 2021-05-20 ENCOUNTER — Encounter: Payer: Self-pay | Admitting: Gastroenterology

## 2021-05-20 ENCOUNTER — Other Ambulatory Visit: Payer: Self-pay

## 2021-05-20 VITALS — BP 134/80 | HR 83 | Ht 63.0 in | Wt 189.2 lb

## 2021-05-20 DIAGNOSIS — R112 Nausea with vomiting, unspecified: Secondary | ICD-10-CM

## 2021-05-20 DIAGNOSIS — R109 Unspecified abdominal pain: Secondary | ICD-10-CM

## 2021-05-20 DIAGNOSIS — E7800 Pure hypercholesterolemia, unspecified: Secondary | ICD-10-CM | POA: Diagnosis not present

## 2021-05-20 DIAGNOSIS — R748 Abnormal levels of other serum enzymes: Secondary | ICD-10-CM | POA: Diagnosis not present

## 2021-05-20 LAB — BASIC METABOLIC PANEL
BUN: 11 mg/dL (ref 6–23)
CO2: 26 mEq/L (ref 19–32)
Calcium: 9.4 mg/dL (ref 8.4–10.5)
Chloride: 106 mEq/L (ref 96–112)
Creatinine, Ser: 1.05 mg/dL (ref 0.40–1.20)
GFR: 55.24 mL/min — ABNORMAL LOW (ref >60.00)
Glucose, Bld: 107 mg/dL — ABNORMAL HIGH (ref 70–99)
Potassium: 3.8 mEq/L (ref 3.5–5.1)
Sodium: 140 mEq/L (ref 135–145)

## 2021-05-20 LAB — HEPATIC FUNCTION PANEL
ALT: 15 U/L (ref 0–35)
AST: 16 U/L (ref 0–37)
Albumin: 4.3 g/dL (ref 3.5–5.2)
Alkaline Phosphatase: 104 U/L (ref 39–117)
Bilirubin, Direct: 0.1 mg/dL (ref 0.0–0.3)
Total Bilirubin: 0.7 mg/dL (ref 0.2–1.2)
Total Protein: 6.8 g/dL (ref 6.0–8.3)

## 2021-05-20 LAB — LIPID PANEL
Cholesterol: 222 mg/dL — ABNORMAL HIGH (ref 0–200)
HDL: 93.2 mg/dL (ref >39.00)
LDL Cholesterol: 118 mg/dL — ABNORMAL HIGH (ref 0–99)
NonHDL: 129.11
Total CHOL/HDL Ratio: 2
Triglycerides: 56 mg/dL (ref 0.0–149.0)
VLDL: 11.2 mg/dL (ref 0.0–40.0)

## 2021-05-20 MED ORDER — SUCRALFATE 1 GM/10ML PO SUSP: 1.0000 g | Freq: Four times a day (QID) | ORAL | 3 refills | Status: DC

## 2021-05-20 MED ORDER — DICYCLOMINE HCL 10 MG PO CAPS: 10.0000 mg | ORAL_CAPSULE | Freq: Three times a day (TID) | ORAL | 5 refills | Status: DC

## 2021-05-20 NOTE — Patient Instructions (Signed)
It was my pleasure to provide care to you today. Based on our discussion, I am providing you with my recommendations below:  RECOMMENDATION(S):   PRESCRIPTION MEDICATION(S):   We have sent the following medication(s) to your pharmacy:  Dicyclomine 10 mg four times daily before meals and at bedtime, may increase to 20 mg four times daily after one week if needed. Refilled Carafate.   IMAGING:  You have been scheduled for a CT scan of the abdomen and pelvis at [facility] ([address]---this is in the same building as [employer]).   You are scheduled on Monday 05/27/21 at 3 pm. You should arrive 15 minutes prior to your appointment time for registration. Please follow the written instructions below on the day of your exam:  WARNING: IF YOU ARE ALLERGIC TO IODINE/X-RAY DYE, PLEASE NOTIFY RADIOLOGY IMMEDIATELY AT [phone]! YOU WILL BE GIVEN A 13 HOUR PREMEDICATION PREP.  1) Do not eat anything after 11 am (4 hours prior to your test) 2) You have been given 2 bottles of oral contrast to drink. The solution may taste better if refrigerated, but do NOT add ice or any other liquid to this solution. Shake well before drinking.    Drink 1 bottle of contrast @ 1 pm (2 hours prior to your exam)  Drink 1 bottle of contrast @ 2 pm (1 hour prior to your exam)  You may take any medications as prescribed with a small amount of water, if necessary. If you take any of the following medications: METFORMIN, GLUCOPHAGE, GLUCOVANCE, AVANDAMET, RIOMET, FORTAMET, [place] MET, JANUMET, GLUMETZA or METAGLIP, you MAY be asked to HOLD this medication 48 hours AFTER the exam.  The purpose of you drinking the oral contrast is to aid in the visualization of your intestinal tract. The contrast solution may cause some diarrhea. Depending on your individual set of symptoms, you may also receive an intravenous injection of x-ray contrast/dye. Plan on being at [facility] for 30  minutes or longer, depending on the type of exam you are having performed.  This test typically takes 30-45 minutes to complete.  If you have any questions regarding your exam or if you need to reschedule, you may call the CT department at [phone] between the hours of 8:00 am and 5:00 pm, Monday-Friday. ___________________________________________________________________  FOLLOW UP:  After your CT scan, you will receive a call from my office staff regarding my recommendation for follow up.  BMI:  If you are age 79 or older, your body mass index should be between 23-30. Your Body mass index is 33.52 kg/m. If this is out of the aforementioned range listed, please consider follow up with your Primary Care Provider.  MY CHART:  The [place] providers would like to encourage you to use [facility] to communicate with providers for non-urgent requests or questions.  Due to long hold times on the telephone, sending your provider a message by [facility] may be a faster and more efficient way to get a response.  Please allow 48 business hours for a response.  Please remember that this is for non-urgent requests.   Thank you for trusting me with your gastrointestinal care!    Thornton [place], MD, MPH

## 2021-05-20 NOTE — Progress Notes (Signed)
Referring Provider: Einar Pheasant, MD Primary Care Physician:  Einar Pheasant, MD  Chief complaint:  Elevated alk phos   IMPRESSION:  Nausea and abdominal pain Abnormal alk phos Reflux and H pylori negative gastritis Chronic constipation Family history of colon cancer (mother at age 67, sister in her 20s)  Elevated alkaline phosphatase without extrahepatic biliary dilatation on ultrasound, CT, or MRI.  Isoenzymes previously normal. Will repeat liver enzymes today. Consider additional serologic evaluation +/- liver biopsy is the magnitude of elevation increases.  Nausea and abdominal pain. Now with 5 pound weight loss: Reflux and gastritis thought to be contributing in the past. Normal gastric emptying scan. Continue PPI BID and H2B. Treat constipation more aggressively. Trial of FDGuard in the meantime.  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  Chronic constipation: May be contributing to nausea and abdominal pain. Insurance denied Linzess. Miralax did not help. Amitiza caused diarrhea. Motegrity would be a good option.    Family of colon cancer: Last colonoscopy 2020. Repeat colonoscopy in 2025, earlier with new symptoms.   PLAN: - Continue pantoprazole 40 mg BID and famotidine 20 mg BID - Refill Carafate today - Trial of Dicyclomine 10 mg QID taken prior to meals, increase to 2 pills QID after one week if needed - CT abd/pelvis with contrast - Discussed trial of peppermint oil, consider other options for gastric accomodation +/- treatment of constipation - Follow-up after the CT scan  Please see the "Patient Instructions" section for addition details about the plan.  HPI: Savannah Gomez is a 66 y.o. female initially referred by Dr. Nicki Reaper for an isolated elevated alk phos. Initially identified in 2019. No change in alk phos seen after cholecystectomy 11/04/19 for chronic cholecystitis, cholelithiasis. Magnitude of the elevation has  fluctuated over the years.   Evaluation of elevated alk phos included:  - Labs 05/17/20: normal CMP except for alk phos 124 - Labs 03/08/20: Alk phos isoenzymes are normal - Abd ultrasound 06/09/18: no findings, no stones - CT abd/pelvis with contrast scan 07/26/18: diverticulosis, no acute findings - MRI/MRCP 01/30/20: moderate pancreatic atrophy, normal liver and biliary tree - Normal pancreatic elastase at 398 01/31/20  EGD 05/01/2020 to evaluate multiple chronic GI symptoms including regurgitation, nausea, globus and chest pain that radiated to the back despite pantoprazole 40 mg BID for several years for reflux showed reflux esophagitis, gastritis, fundic gland polyps, and retained food.  Duodenal biopsies were normal.  There was no H. pylori.  I recommended that she continue her PPI twice daily and famotidine twice daily.  She was asked to avoid all NSAIDs.  Gastric emptying scan 05/28/2020 was normal  At time of last office visit 11/02/20 her primary complaint was regurgitation, nausea, and epigastric pain - not nearly as bad. Having a bowel movement weekly. GI symptoms may worsen as constipation progresses. Sense of complete evacuation. No improvement with Linzess. Miralax caused loose stools.  Insurance would not cover Linzess. She tried some samples but this did not provide relief.   Returns today reporting abdominal pain and nausea - especially after meals.  Notes nocturnal nausea, although she's not sure if the nausea wakes her from her sleep. Symptoms are worse than when she was here in 2022. Symptoms worse while eating or soon after eating. Starts in the upper epigastrium and radiates to the RUQ. No heartburn.  Her fatigue is also worse, and she thinks this may be due to poor sleep related to the nausea.   Trial of  Amitiza caused severe abdominal cramping.  She doesn't remember taking FDGuard, which was recommended at the time of her last visit. Continues on pantoprazole 40 mg BID, famotidine  20 mg BID, and Carafate PRN although she is out of Carafate.   She is currently taking antibiotics to treat a UTI, has urge incontinence managed with Myrbetriq, left nephrolithiasis and is on imipramine for interstitial cystitis.   She has lost 5 pounds since her 8/22 visit. GI ROS is otherwise negative.   Endoscopic history: - EGD for RUQ pain and Colonoscopy 08/2018 with Dr. Vira Agar at the [facility]: hiatal hernia, no H pylori, small hyperplastic polyp, diverticulosis, small internal hemorrhoids - EGD 05/01/2020 showed reflux, gastritis, fundic gland polyps, and retained food.  Duodenal biopsies were normal.  There was no H. pylori.  I recommended that she continue her PPI twice daily and famotidine twice daily.  She was asked to avoid all NSAIDs.  Past Medical History:  Diagnosis Date   Allergy    Arthritis    Asthma    Chicken pox    Complication of anesthesia    GERD (gastroesophageal reflux disease)    History of kidney stones    Migraine headache    PONV (postoperative nausea and vomiting)    Urine incontinence     Past Surgical History:  Procedure Laterality Date   BREAST CYST ASPIRATION Bilateral    neg   carpal tunnel sugery     bilateral   CHOLECYSTECTOMY N/A 11/04/2019   Procedure: LAPAROSCOPIC CHOLECYSTECTOMY WITH INTRAOPERATIVE CHOLANGIOGRAM;  Surgeon: Robert Bellow, MD;  Location: [facility] ORS;  Service: General;  Laterality: N/A;   COLONOSCOPY WITH PROPOFOL N/A 09/20/2018   Procedure: COLONOSCOPY WITH PROPOFOL;  Surgeon: Manya Silvas, MD;  Location: [facility] ENDOSCOPY;  Service: Endoscopy;  Laterality: N/A;   ELBOW SURGERY  1999   ESOPHAGOGASTRODUODENOSCOPY (EGD) WITH PROPOFOL N/A 09/20/2018   Procedure: ESOPHAGOGASTRODUODENOSCOPY (EGD) WITH PROPOFOL;  Surgeon: Manya Silvas, MD;  Location: [facility] ENDOSCOPY;  Service: Endoscopy;  Laterality: N/A;   HAND SURGERY  1999   broken finger - pins   NASAL SINUS SURGERY  1992   ROTATOR CUFF REPAIR  1994   Trigger  finger surgery  1999 and Eugenio Saenz   secondary to fibroids    Current Outpatient Medications  Medication Sig Dispense Refill   dicyclomine (BENTYL) 10 MG capsule Take 1-2 capsules (10-20 mg total) by mouth 4 (four) times daily -  before meals and at bedtime. 240 capsule 5   sucralfate (CARAFATE) 1 GM/10ML suspension Take 10 mLs (1 g total) by mouth 4 (four) times daily. 1200 mL 3   acetaminophen (TYLENOL) 500 MG tablet Take 1,000 mg by mouth daily as needed for mild pain or headache.      albuterol (VENTOLIN HFA) 108 (90 Base) MCG/ACT inhaler Inhale 2 puffs into the lungs every 6 (six) hours as needed for wheezing or shortness of breath. 1 each 10   benzonatate (TESSALON) 200 MG capsule Take 1 capsule (200 mg total) by mouth 3 (three) times daily as needed for cough. 30 capsule 1   famotidine (PEPCID) 20 MG tablet Take 1 tablet (20 mg total) by mouth 2 (two) times daily. 60 tablet 3   fluticasone (FLOVENT HFA) 110 MCG/ACT inhaler INHALE 2 PUFFS BY MOUTH INTO THE LUNGS TWICE A DAY. 12 g 10   imipramine (TOFRANIL) 25 MG tablet Take 1 tablet (25 mg total) by mouth 2 (two) times daily. 180 tablet  3   pantoprazole (PROTONIX) 40 MG tablet TAKE 1 TABLET BY MOUTH TWICE A DAY BEFORE A MEAL 180 tablet 1   tolterodine (DETROL LA) 4 MG 24 hr capsule Take 1 capsule (4 mg total) by mouth daily. 90 capsule 3   topiramate (TOPAMAX) 25 MG tablet TAKE 1 TABLET BY MOUTH TWICE A DAY 180 tablet 1   traMADol (ULTRAM) 50 MG tablet Take 50 mg by mouth every 6 (six) hours as needed.     No current facility-administered medications for this visit.    Allergies as of 05/20/2021 - Review Complete 05/20/2021  Allergen Reaction Noted   Demerol [meperidine] Nausea Only 01/16/2012     Physical Exam: General:   Alert,  well-nourished, pleasant and cooperative in NAD Head:  Normocephalic and atraumatic. Eyes:  Sclera clear, no icterus.   Conjunctiva pink. No xanthelasma.  Abdomen:  Soft, I am  unable to reproduce her pain but she localizes it to the midline port scar from her cholecystectomy, nondistended, normal bowel sounds, no rebound or guarding. No hepatosplenomegaly.  Neurologic:  Alert and  oriented x4;  grossly nonfocal Skin: No rash or bruise. No spider angioma, palmar erythema, or Terry nails.  Psych:  Alert and cooperative. Normal mood and affect.     Latoya Diskin L. Tarri Glenn, MD, MPH 05/20/2021, 4:13 PM

## 2021-05-22 ENCOUNTER — Ambulatory Visit (INDEPENDENT_AMBULATORY_CARE_PROVIDER_SITE_OTHER): Payer: Medicare HMO | Admitting: Internal Medicine

## 2021-05-22 ENCOUNTER — Other Ambulatory Visit: Payer: Self-pay

## 2021-05-22 VITALS — BP 116/70 | HR 93 | Temp 97.9°F | Resp 16 | Ht 63.0 in | Wt 190.2 lb

## 2021-05-22 DIAGNOSIS — E7800 Pure hypercholesterolemia, unspecified: Secondary | ICD-10-CM

## 2021-05-22 DIAGNOSIS — J4520 Mild intermittent asthma, uncomplicated: Secondary | ICD-10-CM

## 2021-05-22 DIAGNOSIS — Z800 Family history of malignant neoplasm of digestive organs: Secondary | ICD-10-CM

## 2021-05-22 DIAGNOSIS — R748 Abnormal levels of other serum enzymes: Secondary | ICD-10-CM

## 2021-05-22 DIAGNOSIS — K219 Gastro-esophageal reflux disease without esophagitis: Secondary | ICD-10-CM | POA: Diagnosis not present

## 2021-05-22 DIAGNOSIS — M5441 Lumbago with sciatica, right side: Secondary | ICD-10-CM | POA: Diagnosis not present

## 2021-05-22 DIAGNOSIS — R5383 Other fatigue: Secondary | ICD-10-CM

## 2021-05-22 MED ORDER — PANTOPRAZOLE SODIUM 40 MG PO TBEC: DELAYED_RELEASE_TABLET | ORAL | 1 refills | Status: DC

## 2021-05-22 MED ORDER — TOPIRAMATE 25 MG PO TABS: 25.0000 mg | ORAL_TABLET | Freq: Two times a day (BID) | ORAL | 1 refills | Status: DC

## 2021-05-22 NOTE — Assessment & Plan Note (Signed)
The 10-year ASCVD risk score (Arnett DK, et al., 2019) is: 4.4% ?  Values used to calculate the score: ?    Age: 66 years ?    Sex: Female ?    Is Non-Hispanic African American: No ?    Diabetic: No ?    Tobacco smoker: No ?    Systolic Blood Pressure: 116 mmHg ?    Is BP treated: No ?    HDL Cholesterol: 93.2 mg/dL ?    Total Cholesterol: 222 mg/dL  ?Low cholesterol diet and exercise.  Follow lipid panel.  ?

## 2021-05-22 NOTE — Progress Notes (Signed)
Patient ID: Savannah Gomez, female   DOB: [birth year], 66 y.o.   MRN: [id number]   Subjective:    Patient ID: Savannah Gomez, female    DOB: 29-May-1955, 66 y.o.   MRN: [id number]  This visit occurred during the SARS-CoV-2 public health emergency.  Safety protocols were in place, including screening questions prior to the visit, additional usage of staff PPE, and extensive cleaning of exam room while observing appropriate contact time as indicated for disinfecting solutions.   Patient here for a scheduled follow up.   Chief Complaint  Patient presents with   Gastroesophageal Reflux   Hyperlipidemia   .   HPI Seeing Dr Tarri Glenn - persistent nausea/vomiting and elevated alkaline phos.  Had normal gastric emptying.  Taking PPI and H2B.  Recommended - carafate.  Trial of dicyclomine.  Scheduled for CT abd/pelvis - 05/27/21.  Eating does aggravate.  Reports increased fatigue.  Notices with walking, exerting herself.  No chest pain.  No increased cough or congestion.     Past Medical History:  Diagnosis Date   Allergy    Arthritis    Asthma    Chicken pox    Complication of anesthesia    GERD (gastroesophageal reflux disease)    History of kidney stones    Migraine headache    PONV (postoperative nausea and vomiting)    Urine incontinence    Past Surgical History:  Procedure Laterality Date   BREAST CYST ASPIRATION Bilateral    neg   carpal tunnel sugery     bilateral   CHOLECYSTECTOMY N/A 11/04/2019   Procedure: LAPAROSCOPIC CHOLECYSTECTOMY WITH INTRAOPERATIVE CHOLANGIOGRAM;  Surgeon: Robert Bellow, MD;  Location: [facility] ORS;  Service: General;  Laterality: N/A;   COLONOSCOPY WITH PROPOFOL N/A 09/20/2018   Procedure: COLONOSCOPY WITH PROPOFOL;  Surgeon: Manya Silvas, MD;  Location: [facility] ENDOSCOPY;  Service: Endoscopy;  Laterality: N/A;   ELBOW SURGERY  1999   ESOPHAGOGASTRODUODENOSCOPY (EGD) WITH PROPOFOL N/A 09/20/2018   Procedure: ESOPHAGOGASTRODUODENOSCOPY (EGD) WITH  PROPOFOL;  Surgeon: Manya Silvas, MD;  Location: [facility] ENDOSCOPY;  Service: Endoscopy;  Laterality: N/A;   HAND SURGERY  1999   broken finger - pins   NASAL SINUS SURGERY  1992   ROTATOR CUFF REPAIR  1994   Trigger finger surgery  1999 and 2000   VAGINAL HYSTERECTOMY  1987   secondary to fibroids   Family History  Problem Relation Age of Onset   Colon cancer Mother        colon   Asthma Mother    Heart disease Father    Hypertension Father    Colon cancer Sister    Stroke Maternal Grandfather    Diabetes Maternal Grandfather    Lung cancer Maternal Grandmother    Breast cancer Maternal Grandmother    Esophageal cancer Neg Hx    Pancreatic cancer Neg Hx    Liver disease Neg Hx    Stomach cancer Neg Hx    Social History   Socioeconomic History   Marital status: Married    Spouse name: Not on file   Number of children: Not on file   Years of education: Not on file   Highest education level: Not on file  Occupational History   Not on file  Tobacco Use   Smoking status: Never   Smokeless tobacco: Never  Vaping Use   Vaping Use: Never used  Substance and Sexual Activity   Alcohol use: No   Drug use: No   Sexual  activity: Yes    Birth control/protection: None, Post-menopausal  Other Topics Concern   Not on file  Social History Narrative   Not on file   Social Determinants of Health   Financial Resource Strain: Low Risk    Difficulty of Paying Living Expenses: Not hard at all  Food Insecurity: No Food Insecurity   Worried About Charity fundraiser in the Last Year: Never true   [place] in the Last Year: Never true  Transportation Needs: No Transportation Needs   Lack of Transportation (Medical): No   Lack of Transportation (Non-Medical): No  Physical Activity: Not on file  Stress: No Stress Concern Present   Feeling of Stress : Not at all  Social Connections: Unknown   Frequency of Communication with Friends and Family: More than three times a week    Frequency of Social Gatherings with Friends and Family: More than three times a week   Attends Religious Services: Not on file   Active Member of Clubs or Organizations: Not on file   Attends Archivist Meetings: Not on file   Marital Status: Not on file     Review of Systems  Constitutional:  Positive for appetite change and fatigue. Negative for unexpected weight change.  HENT:  Negative for congestion and sinus pressure.   Respiratory:  Negative for cough, chest tightness and shortness of breath.   Cardiovascular:  Negative for chest pain, palpitations and leg swelling.  Gastrointestinal:  Negative for abdominal pain, diarrhea, nausea and vomiting.  Genitourinary:  Negative for difficulty urinating and dysuria.  Musculoskeletal:  Negative for joint swelling and myalgias.  Skin:  Negative for color change and rash.  Neurological:  Negative for dizziness, light-headedness and headaches.  Psychiatric/Behavioral:  Negative for agitation and dysphoric mood.       Objective:     BP 116/70    Pulse 93    Temp 97.9 F (36.6 C)    Resp 16    Ht 5\' 3"  (1.6 m)    Wt 190 lb 3.2 oz (86.3 kg)    SpO2 99%    BMI 33.69 kg/m  Wt Readings from Last 3 Encounters:  05/22/21 190 lb 3.2 oz (86.3 kg)  05/20/21 189 lb 3.2 oz (85.8 kg)  03/01/21 190 lb (86.2 kg)    Physical Exam Vitals reviewed.  Constitutional:      General: She is not in acute distress.    Appearance: Normal appearance.  HENT:     Head: Normocephalic and atraumatic.     Right Ear: External ear normal.     Left Ear: External ear normal.  Eyes:     General: No scleral icterus.       Right eye: No discharge.        Left eye: No discharge.     Conjunctiva/sclera: Conjunctivae normal.  Neck:     Thyroid: No thyromegaly.  Cardiovascular:     Rate and Rhythm: Normal rate and regular rhythm.  Pulmonary:     Effort: No respiratory distress.     Breath sounds: Normal breath sounds. No wheezing.  Abdominal:      General: Bowel sounds are normal.     Palpations: Abdomen is soft.     Tenderness: There is no abdominal tenderness.  Musculoskeletal:        General: No swelling or tenderness.     Cervical back: Neck supple. No tenderness.  Lymphadenopathy:     Cervical: No cervical adenopathy.  Skin:  Findings: No erythema or rash.  Neurological:     Mental Status: She is alert.  Psychiatric:        Mood and Affect: Mood normal.        Behavior: Behavior normal.     Outpatient Encounter Medications as of 05/22/2021  Medication Sig   acetaminophen (TYLENOL) 500 MG tablet Take 1,000 mg by mouth daily as needed for mild pain or headache.    albuterol (VENTOLIN HFA) 108 (90 Base) MCG/ACT inhaler Inhale 2 puffs into the lungs every 6 (six) hours as needed for wheezing or shortness of breath.   dicyclomine (BENTYL) 10 MG capsule Take 1-2 capsules (10-20 mg total) by mouth 4 (four) times daily -  before meals and at bedtime.   famotidine (PEPCID) 20 MG tablet Take 1 tablet (20 mg total) by mouth 2 (two) times daily.   fluticasone (FLOVENT HFA) 110 MCG/ACT inhaler INHALE 2 PUFFS BY MOUTH INTO THE LUNGS TWICE A DAY.   imipramine (TOFRANIL) 25 MG tablet Take 1 tablet (25 mg total) by mouth 2 (two) times daily.   pantoprazole (PROTONIX) 40 MG tablet TAKE 1 TABLET BY MOUTH TWICE A DAY BEFORE A MEAL   sucralfate (CARAFATE) 1 GM/10ML suspension Take 10 mLs (1 g total) by mouth 4 (four) times daily.   tolterodine (DETROL LA) 4 MG 24 hr capsule Take 1 capsule (4 mg total) by mouth daily.   topiramate (TOPAMAX) 25 MG tablet Take 1 tablet (25 mg total) by mouth 2 (two) times daily.   traMADol (ULTRAM) 50 MG tablet Take 50 mg by mouth every 6 (six) hours as needed.   [DISCONTINUED] benzonatate (TESSALON) 200 MG capsule Take 1 capsule (200 mg total) by mouth 3 (three) times daily as needed for cough.   [DISCONTINUED] pantoprazole (PROTONIX) 40 MG tablet TAKE 1 TABLET BY MOUTH TWICE A DAY BEFORE A MEAL    [DISCONTINUED] topiramate (TOPAMAX) 25 MG tablet TAKE 1 TABLET BY MOUTH TWICE A DAY   No facility-administered encounter medications on file as of 05/22/2021.     Lab Results  Component Value Date   WBC 4.4 09/14/2020   HGB 13.9 09/14/2020   HCT 41.6 09/14/2020   PLT 251.0 09/14/2020   GLUCOSE 107 (H) 05/20/2021   CHOL 222 (H) 05/20/2021   TRIG 56.0 05/20/2021   HDL 93.20 05/20/2021   LDLCALC 118 (H) 05/20/2021   ALT 15 05/20/2021   AST 16 05/20/2021   NA 140 05/20/2021   K 3.8 05/20/2021   CL 106 05/20/2021   CREATININE 1.05 05/20/2021   BUN 11 05/20/2021   CO2 26 05/20/2021   TSH 2.62 01/18/2021   HGBA1C 5.4 10/03/2019    DG Abd 1 View  Result Date: 01/31/2021 CLINICAL DATA:  Flank pain EXAM: ABDOMEN - 1 VIEW COMPARISON:  11/22/2020 FINDINGS: Nonobstructed gas pattern. No radiopaque calculi are visualized. Moderate stool in the colon. IMPRESSION: Negative. Electronically Signed   By: Jasmine Pang M.D.   On: 01/31/2021 23:46       Assessment & Plan:   Problem List Items Addressed This Visit     Asthma    Breathing appears to be stable.  Has rescue inhaler if needed.  Chest x-ray November 2021 revealed no acute abnormality.      Back pain    Emerge:  L4-5 spondylolisthesis and S1 radiculopathy s/p SI injections 01/30/21.  Better.       Elevated alkaline phosphatase level    Alkaline phos level 05/20/21 - wnl  Family history of colon cancer    Reviewed.  Colonoscopy 2020. Due 2025.        Fatigue    Increased fatigue.  Fatigue with exertion.  EKG - SR with no acute ischemic changes.  Given persistent symptoms and increased fatigue with exertion, will have cardiology evaluate with question of need for further cardiac w/up.        Relevant Orders   EKG 12-Lead (Completed)   Ambulatory referral to Cardiology   GERD (gastroesophageal reflux disease)    EGD February 2022-gastritis.  Continue proton pump inhibitor and Pepcid.  Carafate added.  Continues to  follow-up with GI (Dr. Tarri Glenn). W/up as outlined.  Normal gastric emptying study. Planning for CT scan 05/27/21.       Relevant Medications   pantoprazole (PROTONIX) 40 MG tablet   Hypercholesteremia - Primary    The 10-year ASCVD risk score (Arnett DK, et al., 2019) is: 4.4%   Values used to calculate the score:     Age: 59 years     Sex: Female     Is Non-Hispanic African American: No     Diabetic: No     Tobacco smoker: No     Systolic Blood Pressure: 99991111 mmHg     Is BP treated: No     HDL Cholesterol: 93.2 mg/dL     Total Cholesterol: 222 mg/dL  Low cholesterol diet and exercise.  Follow lipid panel.       Relevant Orders   Basic metabolic panel   Ambulatory referral to Cardiology     Einar Pheasant, MD

## 2021-05-27 ENCOUNTER — Other Ambulatory Visit: Payer: Self-pay

## 2021-05-27 ENCOUNTER — Encounter: Payer: Self-pay | Admitting: Internal Medicine

## 2021-05-27 ENCOUNTER — Ambulatory Visit (INDEPENDENT_AMBULATORY_CARE_PROVIDER_SITE_OTHER)
Admission: RE | Admit: 2021-05-27 | Discharge: 2021-05-27 | Disposition: A | Discharge status: Home / Self Care | Payer: Medicare HMO | Source: Ambulatory Visit | Attending: Gastroenterology | Admitting: Gastroenterology

## 2021-05-27 DIAGNOSIS — R109 Unspecified abdominal pain: Secondary | ICD-10-CM

## 2021-05-27 DIAGNOSIS — N200 Calculus of kidney: Secondary | ICD-10-CM | POA: Diagnosis not present

## 2021-05-27 DIAGNOSIS — R748 Abnormal levels of other serum enzymes: Secondary | ICD-10-CM

## 2021-05-27 DIAGNOSIS — R112 Nausea with vomiting, unspecified: Secondary | ICD-10-CM | POA: Diagnosis not present

## 2021-05-27 DIAGNOSIS — K760 Fatty (change of) liver, not elsewhere classified: Secondary | ICD-10-CM | POA: Diagnosis not present

## 2021-05-27 DIAGNOSIS — R1110 Vomiting, unspecified: Secondary | ICD-10-CM | POA: Diagnosis not present

## 2021-05-27 MED ORDER — IOHEXOL 300 MG/ML  SOLN
100.0000 mL | Freq: Once | INTRAMUSCULAR | Status: AC | PRN
  Administered 2021-05-27: 100 mL via INTRAVENOUS

## 2021-05-27 NOTE — Assessment & Plan Note (Signed)
Alkaline phos level 05/20/21 - wnl 

## 2021-05-27 NOTE — Assessment & Plan Note (Signed)
Breathing appears to be stable.  Has rescue inhaler if needed.  Chest x-ray November 2021 revealed no acute abnormality. ?

## 2021-05-27 NOTE — Assessment & Plan Note (Signed)
EGD February 2022-gastritis.  Continue proton pump inhibitor and Pepcid.  Carafate added.  Continues to follow-up with GI (Dr. Orvan Falconer). W/up as outlined.  Normal gastric emptying study. Planning for CT scan 05/27/21.  ?

## 2021-05-27 NOTE — Assessment & Plan Note (Signed)
Increased fatigue.  Fatigue with exertion.  EKG - SR with no acute ischemic changes.  Given persistent symptoms and increased fatigue with exertion, will have cardiology evaluate with question of need for further cardiac w/up.   ?

## 2021-05-27 NOTE — Assessment & Plan Note (Signed)
Reviewed.  Colonoscopy 2020. Due 2025.   

## 2021-05-27 NOTE — Assessment & Plan Note (Addendum)
Emerge:  L4-5 spondylolisthesis and S1 radiculopathy s/p SI injections 01/30/21.  Better.  ?

## 2021-05-28 ENCOUNTER — Other Ambulatory Visit: Payer: Self-pay

## 2021-05-28 DIAGNOSIS — K5909 Other constipation: Secondary | ICD-10-CM

## 2021-05-28 DIAGNOSIS — R109 Unspecified abdominal pain: Secondary | ICD-10-CM

## 2021-05-28 MED ORDER — MOTEGRITY 2 MG PO TABS: 2.0000 mg | ORAL_TABLET | Freq: Every day | ORAL | 2 refills | Status: DC

## 2021-05-30 DIAGNOSIS — E7800 Pure hypercholesterolemia, unspecified: Secondary | ICD-10-CM | POA: Diagnosis not present

## 2021-05-30 DIAGNOSIS — R079 Chest pain, unspecified: Secondary | ICD-10-CM | POA: Diagnosis not present

## 2021-05-30 DIAGNOSIS — R109 Unspecified abdominal pain: Secondary | ICD-10-CM | POA: Diagnosis not present

## 2021-05-30 DIAGNOSIS — R0602 Shortness of breath: Secondary | ICD-10-CM | POA: Diagnosis not present

## 2021-05-30 DIAGNOSIS — R0600 Dyspnea, unspecified: Secondary | ICD-10-CM | POA: Diagnosis not present

## 2021-06-03 ENCOUNTER — Telehealth: Payer: Self-pay | Admitting: *Deleted

## 2021-06-03 NOTE — Telephone Encounter (Signed)
PA submitted and approval via Covermymeds for [employer]. Faxed approval to pharmacy.  ?

## 2021-06-04 DIAGNOSIS — R0602 Shortness of breath: Secondary | ICD-10-CM | POA: Diagnosis not present

## 2021-06-12 ENCOUNTER — Other Ambulatory Visit (INDEPENDENT_AMBULATORY_CARE_PROVIDER_SITE_OTHER): Payer: Medicare HMO

## 2021-06-12 ENCOUNTER — Other Ambulatory Visit: Payer: Self-pay

## 2021-06-12 DIAGNOSIS — E7800 Pure hypercholesterolemia, unspecified: Secondary | ICD-10-CM | POA: Diagnosis not present

## 2021-06-12 LAB — BASIC METABOLIC PANEL
BUN: 10 mg/dL (ref 6–23)
CO2: 26 mEq/L (ref 19–32)
Calcium: 9.3 mg/dL (ref 8.4–10.5)
Chloride: 105 mEq/L (ref 96–112)
Creatinine, Ser: 0.91 mg/dL (ref 0.40–1.20)
GFR: 65.56 mL/min (ref >60.00)
Glucose, Bld: 101 mg/dL — ABNORMAL HIGH (ref 70–99)
Potassium: 4 mEq/L (ref 3.5–5.1)
Sodium: 139 mEq/L (ref 135–145)

## 2021-06-20 DIAGNOSIS — H16103 Unspecified superficial keratitis, bilateral: Secondary | ICD-10-CM | POA: Diagnosis not present

## 2021-06-20 DIAGNOSIS — H25013 Cortical age-related cataract, bilateral: Secondary | ICD-10-CM | POA: Diagnosis not present

## 2021-06-20 DIAGNOSIS — H40013 Open angle with borderline findings, low risk, bilateral: Secondary | ICD-10-CM | POA: Diagnosis not present

## 2021-06-24 ENCOUNTER — Other Ambulatory Visit: Payer: Self-pay | Admitting: Gastroenterology

## 2021-06-25 DIAGNOSIS — H04123 Dry eye syndrome of bilateral lacrimal glands: Secondary | ICD-10-CM | POA: Diagnosis not present

## 2021-06-25 DIAGNOSIS — H0288B Meibomian gland dysfunction left eye, upper and lower eyelids: Secondary | ICD-10-CM | POA: Diagnosis not present

## 2021-06-25 DIAGNOSIS — H25813 Combined forms of age-related cataract, bilateral: Secondary | ICD-10-CM | POA: Diagnosis not present

## 2021-06-25 DIAGNOSIS — H0288A Meibomian gland dysfunction right eye, upper and lower eyelids: Secondary | ICD-10-CM | POA: Diagnosis not present

## 2021-07-09 DIAGNOSIS — H0288A Meibomian gland dysfunction right eye, upper and lower eyelids: Secondary | ICD-10-CM | POA: Diagnosis not present

## 2021-07-09 DIAGNOSIS — H0288B Meibomian gland dysfunction left eye, upper and lower eyelids: Secondary | ICD-10-CM | POA: Diagnosis not present

## 2021-07-09 DIAGNOSIS — H04123 Dry eye syndrome of bilateral lacrimal glands: Secondary | ICD-10-CM | POA: Diagnosis not present

## 2021-07-16 ENCOUNTER — Encounter: Payer: Self-pay | Admitting: Gastroenterology

## 2021-07-16 ENCOUNTER — Ambulatory Visit: Payer: Medicare HMO | Admitting: Gastroenterology

## 2021-07-16 VITALS — BP 144/82 | HR 84 | Ht 63.0 in | Wt 192.2 lb

## 2021-07-16 DIAGNOSIS — R112 Nausea with vomiting, unspecified: Secondary | ICD-10-CM | POA: Diagnosis not present

## 2021-07-16 DIAGNOSIS — R109 Unspecified abdominal pain: Secondary | ICD-10-CM | POA: Diagnosis not present

## 2021-07-16 DIAGNOSIS — K5901 Slow transit constipation: Secondary | ICD-10-CM | POA: Diagnosis not present

## 2021-07-16 DIAGNOSIS — R748 Abnormal levels of other serum enzymes: Secondary | ICD-10-CM

## 2021-07-16 MED ORDER — DICYCLOMINE HCL 20 MG PO TABS
20.0000 mg | ORAL_TABLET | Freq: Three times a day (TID) | ORAL | 3 refills | Status: DC
Start: 2021-07-16 — End: 2021-09-23

## 2021-07-16 NOTE — Progress Notes (Signed)
? ?Referring Provider: Einar Pheasant, MD ?Primary Care Physician:  Einar Pheasant, MD ? ?Chief complaint:  Abdominal pain, nausea, constipation ? ? ?IMPRESSION:  ?Nausea and abdominal pain - ? Functional dyspepsia ?History of abnormal alk phos - most recently normal ?Reflux and H pylori negative gastritis ?Chronic constipation ?Colonic diverticulosis by CT ?Family history of colon cancer (mother at age 67, sister in her 13s) ? ?Nausea and abdominal pain.  Reflux and gastritis thought to be contributing in the past. Normal gastric emptying scan. Continue PPI BID and H2B. Symptoms persist despite more aggressively treating constipation. Trial of FDGuard and peppermint has not. Increase dicyclomine to 20 mg QID.   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 ? ?Chronic constipation: Insurance denied Linzess. Miralax did not help. Amitiza caused diarrhea. Continue Motegrity.   ? ?Family of colon cancer: Last colonoscopy 2020. Repeat colonoscopy in 2025, earlier with new symptoms.  ? ?Elevated alkaline phosphatase without extrahepatic biliary dilatation on ultrasound, CT, or MRI.  Isoenzymes previously normal. Follow-up alk phos normal. No additional evaluation planned at this time.  ? ?PLAN: ?- Continue pantoprazole 40 mg BID and famotidine 20 mg BID and Carafate ?- Continue Motegrity 2 mg daily ?- Increase Dicyclomine to 20 mg QID  ?- Discussed trial of peppermint oil, consider other options for gastric accomodation +/- treatment of constipation ?- MyChart follow-up in 2 weeks ?- Consider trial of buspirone 10 mg prior to meals if the increase in dicyclomine does not provide additional relief ? ?Please see the "Patient Instructions" section for addition details about the plan. ? ?HPI: Savannah Gomez is a 66 y.o. female who returns in follow-up for evaluation of abdominal pain, nausea, and constipation. The interval history is obtained through the patient and review of her  electronic health record. ? ?Initially referred by Dr. Nicki Reaper for an isolated elevated alk phos first identified in 2019. No change in alk phos seen after cholecystectomy 11/04/19 for chronic cholecystitis, cholelithiasis although the magnitude of the elevation has fluctuated over the years. However, most recently the alk phos was normal. Evaluation including labs, abdominal ultrasound, contrast CT, and MRI/MRCP was remarked only for moderate pancreatic atrophy. Pancreatic elastase was normal. ? ?She had an EGD 05/01/2020 to evaluate multiple chronic GI symptoms including regurgitation, nausea, globus and chest pain that radiated to the back despite pantoprazole 40 mg BID for several years for reflux showed reflux esophagitis, gastritis, fundic gland polyps, and retained food.  Duodenal biopsies were normal.  There was no H. pylori.  I recommended that she continue her PPI twice daily and famotidine twice daily.  She was asked to avoid all NSAIDs. ? ?Gastric emptying scan 05/28/2020 was normal ? ?At time of her office visit 11/02/20 her primary complaint was regurgitation, nausea, and epigastric pain - not nearly as bad. Having a bowel movement weekly. GI symptoms may worsen as constipation progresses. Sense of complete evacuation. No improvement with Linzess. Miralax caused loose stools.  Insurance would not cover Linzess. She tried some samples but this did not provide relief.  ? ?When seen in follow-up 05/20/21 she reported abdominal pain and nausea - especially after meals.  There was also some nocturnal nausea, although she's not sure if the nausea wakes her from her sleep. Symptoms are worse than when she was here in 2022 and particularly bad after eating or soon after eating. Pain starts in the upper epigastrium and radiates to the RUQ. No heartburn.  Her fatigue is also worse, and she thinks  this may be due to poor sleep related to the nausea.  ? ?Trial of Amitiza caused severe abdominal cramping.  ?She doesn't  remember taking FDGuard, which was recommended at the time of her last visit. ?Continues on pantoprazole 40 mg BID, famotidine 20 mg BID, and Carafate PRN although she is out of Carafate.  ? ?CT abd/pelvis with contrast 05/27/21: fatty liver, prior cholecystectomy, small hiatal hernia, colonic diverticulosis ? ?Returns today in follow-up.  Trial of dicyclomine and Motegrity may provide some relief. Otherwise, non-prescription treatments as discussed at the time of her last office visit have not provided any meaningful relief.  ? ? ?Endoscopic history: ?- EGD for RUQ pain and Colonoscopy 08/2018 with Dr. Vira Agar at the [facility]: hiatal hernia, no H pylori, small hyperplastic polyp, diverticulosis, small internal hemorrhoids ?- EGD 05/01/2020 showed reflux, gastritis, fundic gland polyps, and retained food.  Duodenal biopsies were normal.  There was no H. pylori.  I recommended that she continue her PPI twice daily and famotidine twice daily.  She was asked to avoid all NSAIDs. ? ?Past Medical History:  ?Diagnosis Date  ? Allergy   ? Arthritis   ? Asthma   ? Chicken pox   ? Complication of anesthesia   ? GERD (gastroesophageal reflux disease)   ? History of kidney stones   ? Migraine headache   ? PONV (postoperative nausea and vomiting)   ? Urine incontinence   ? ? ?Past Surgical History:  ?Procedure Laterality Date  ? BREAST CYST ASPIRATION Bilateral   ? neg  ? carpal tunnel sugery    ? bilateral  ? CHOLECYSTECTOMY N/A 11/04/2019  ? Procedure: LAPAROSCOPIC CHOLECYSTECTOMY WITH INTRAOPERATIVE CHOLANGIOGRAM;  Surgeon: Robert Bellow, MD;  Location: [facility] ORS;  Service: General;  Laterality: N/A;  ? COLONOSCOPY WITH PROPOFOL N/A 09/20/2018  ? Procedure: COLONOSCOPY WITH PROPOFOL;  Surgeon: Manya Silvas, MD;  [facility] ENDOSCOPY;  Service: Endoscopy;  Laterality: N/A;  ? [place]  ? ESOPHAGOGASTRODUODENOSCOPY (EGD) WITH PROPOFOL N/A 09/20/2018  ? Procedure: ESOPHAGOGASTRODUODENOSCOPY (EGD) WITH  PROPOFOL;  Surgeon: Manya Silvas, MD;  Location: [facility] ENDOSCOPY;  Service: Endoscopy;  Laterality: N/A;  ? HAND SURGERY  1999  ? broken finger - pins  ? NASAL SINUS SURGERY  1992  ? [place]  ? Trigger finger surgery  1999 and 2000  ? VAGINAL HYSTERECTOMY  1987  ? secondary to fibroids  ? ? ?Current Outpatient Medications  ?Medication Sig Dispense Refill  ? acetaminophen (TYLENOL) 500 MG tablet Take 1,000 mg by mouth daily as needed for mild pain or headache.     ? albuterol (VENTOLIN HFA) 108 (90 Base) MCG/ACT inhaler Inhale 2 puffs into the lungs every 6 (six) hours as needed for wheezing or shortness of breath. 1 each 10  ? dicyclomine (BENTYL) 10 MG capsule Take 1-2 capsules (10-20 mg total) by mouth 4 (four) times daily -  before meals and at bedtime. 240 capsule 5  ? famotidine (PEPCID) 20 MG tablet TAKE 1 TABLET BY MOUTH TWICE A DAY 60 tablet 3  ? fluticasone (FLOVENT HFA) 110 MCG/ACT inhaler INHALE 2 PUFFS BY MOUTH INTO THE LUNGS TWICE A DAY. 12 g 10  ? imipramine (TOFRANIL) 25 MG tablet Take 1 tablet (25 mg total) by mouth 2 (two) times daily. 180 tablet 3  ? pantoprazole (PROTONIX) 40 MG tablet TAKE 1 TABLET BY MOUTH TWICE A DAY BEFORE A MEAL 180 tablet 1  ? Prucalopride Succinate (MOTEGRITY) 2  MG TABS Take 1 tablet (2 mg total) by mouth daily. 30 tablet 2  ? sucralfate (CARAFATE) 1 GM/10ML suspension Take 10 mLs (1 g total) by mouth 4 (four) times daily. 1200 mL 3  ? tolterodine (DETROL LA) 4 MG 24 hr capsule Take 1 capsule (4 mg total) by mouth daily. 90 capsule 3  ? topiramate (TOPAMAX) 25 MG tablet Take 1 tablet (25 mg total) by mouth 2 (two) times daily. 180 tablet 1  ? traMADol (ULTRAM) 50 MG tablet Take 50 mg by mouth every 6 (six) hours as needed.    ? ?No current facility-administered medications for this visit.  ? ? ?Allergies as of 07/16/2021 - Review Complete 07/16/2021  ?Allergen Reaction Noted  ? Demerol [meperidine] Nausea Only 01/16/2012  ? ? ? ?Physical Exam: ?Weight  05/20/21 189 pounds ?Weight 08/15/21: 192 pounds ?General:   Alert,  well-nourished, pleasant and cooperative in NAD ?Head:  Normocephalic and atraumatic. ?Eyes:  Sclera clear, no icterus.   Conjunctiva pink. No

## 2021-07-16 NOTE — Patient Instructions (Addendum)
It was my pleasure to provide care to you today. Based on our discussion, I am providing you with my recommendations below: ? ?RECOMMENDATION(S):  ? ?Increase Dicyclomine 20 mg four times daily before meals and at bedtime.  ? ?Continue Motegrity 2 mg daily. ? ? ?FOLLOW UP: ? ?Please send a Mychart message in 2 weeks with an update.  ? ?BMI: ? ?If you are age 67 or older, your body mass index should be between 23-30. Your Body mass index is 34.06 kg/m?Marland Kitchen If this is out of the aforementioned range listed, please consider follow up with your Primary Care Provider. ? ? ?MY CHART: ? ?The [facility] providers would like to encourage you to use [facility] to communicate with providers for non-urgent requests or questions.  Due to long hold times on the telephone, sending your provider a message by [facility] may be a faster and more efficient way to get a response.  Please allow 48 business hours for a response.  Please remember that this is for non-urgent requests.  ? ?Thank you for trusting me with your gastrointestinal care!   ? ?Tressia Danas, MD, MPH ? ? ?

## 2021-07-18 DIAGNOSIS — H25812 Combined forms of age-related cataract, left eye: Secondary | ICD-10-CM | POA: Diagnosis not present

## 2021-07-18 DIAGNOSIS — H269 Unspecified cataract: Secondary | ICD-10-CM | POA: Diagnosis not present

## 2021-07-18 DIAGNOSIS — H52222 Regular astigmatism, left eye: Secondary | ICD-10-CM | POA: Diagnosis not present

## 2021-07-30 DIAGNOSIS — H25812 Combined forms of age-related cataract, left eye: Secondary | ICD-10-CM | POA: Diagnosis not present

## 2021-08-02 DIAGNOSIS — H25811 Combined forms of age-related cataract, right eye: Secondary | ICD-10-CM | POA: Diagnosis not present

## 2021-08-05 DIAGNOSIS — H52221 Regular astigmatism, right eye: Secondary | ICD-10-CM | POA: Diagnosis not present

## 2021-08-05 DIAGNOSIS — H25811 Combined forms of age-related cataract, right eye: Secondary | ICD-10-CM | POA: Diagnosis not present

## 2021-08-05 DIAGNOSIS — H2511 Age-related nuclear cataract, right eye: Secondary | ICD-10-CM | POA: Diagnosis not present

## 2021-08-14 DIAGNOSIS — H25811 Combined forms of age-related cataract, right eye: Secondary | ICD-10-CM | POA: Diagnosis not present

## 2021-08-26 ENCOUNTER — Encounter: Payer: Self-pay | Admitting: Gastroenterology

## 2021-08-29 ENCOUNTER — Other Ambulatory Visit: Payer: Self-pay | Admitting: *Deleted

## 2021-08-29 DIAGNOSIS — R109 Unspecified abdominal pain: Secondary | ICD-10-CM

## 2021-08-29 DIAGNOSIS — K5909 Other constipation: Secondary | ICD-10-CM

## 2021-08-29 MED ORDER — MOTEGRITY 2 MG PO TABS: 2.0000 mg | ORAL_TABLET | Freq: Every day | ORAL | 2 refills | Status: DC

## 2021-09-13 ENCOUNTER — Telehealth: Payer: Self-pay | Admitting: Internal Medicine

## 2021-09-13 DIAGNOSIS — R748 Abnormal levels of other serum enzymes: Secondary | ICD-10-CM

## 2021-09-13 DIAGNOSIS — E7800 Pure hypercholesterolemia, unspecified: Secondary | ICD-10-CM

## 2021-09-16 ENCOUNTER — Ambulatory Visit (INDEPENDENT_AMBULATORY_CARE_PROVIDER_SITE_OTHER): Payer: Medicare HMO

## 2021-09-16 VITALS — Ht 63.0 in | Wt 192.0 lb

## 2021-09-16 DIAGNOSIS — Z0000 Encounter for general adult medical examination without abnormal findings: Secondary | ICD-10-CM

## 2021-09-16 NOTE — Patient Instructions (Addendum)
  Savannah Gomez , Thank you for taking time to come for your Medicare Wellness Visit. I appreciate your ongoing commitment to your health goals. Please review the following plan we discussed and let me know if I can assist you in the future.   These are the goals we discussed:  Goals      Maintain healthy lifestyle     Stay active Healthy diet/stay hydrated        This is a list of the screening recommended for you and due dates:  Health Maintenance  Topic Date Due   Pneumonia Vaccine (3 - PPSV23 if available, else PCV20) 08/19/2021   COVID-19 Vaccine (5 - Pfizer series) 10/02/2021*   Tetanus Vaccine  09/17/2022*   Flu Shot  10/22/2021   Mammogram  11/29/2021   Colon Cancer Screening  09/19/2028   DEXA scan (bone density measurement)  Completed   Hepatitis C Screening: USPSTF Recommendation to screen - Ages 25-79 yo.  Completed   Zoster (Shingles) Vaccine  Completed   HPV Vaccine  Aged Out  *Topic was postponed. The date shown is not the original due date.

## 2021-09-16 NOTE — Progress Notes (Signed)
Subjective:   Savannah Gomez is a 66 y.o. female who presents for Medicare Annual (Subsequent) preventive examination.  Review of Systems    No ROS.  Medicare Wellness Virtual Visit.  Visual/audio telehealth visit, UTA vital signs.   See social history for additional risk factors.   Cardiac Risk Factors include: advanced age (>76men, >36 women)     Objective:    Today's Vitals   09/16/21 1236  Weight: 192 lb (87.1 kg)  Height: 5\' 3"  (1.6 m)   Body mass index is 34.01 kg/m.     09/16/2021   12:43 PM 09/14/2020    8:27 AM 11/04/2019   10:22 AM 10/26/2019    9:05 AM 09/20/2018    8:25 AM 09/08/2017    1:04 PM 02/11/2015   12:56 PM  Advanced Directives  Does Patient Have a Medical Advance Directive? Yes Yes Yes Yes Yes Yes Yes  Type of Estate agent of [place];Living will Healthcare Power of [place];Living will Healthcare Power of [place];Living will Healthcare Power of [place];Living will Healthcare Power of [place];Living will Healthcare Power of [place];Living will Healthcare Power of Attorney  Does patient want to make changes to medical advance directive? No - Patient declined No - Patient declined No - Patient declined      Copy of Healthcare Power of Attorney in Chart? No - copy requested No - copy requested Yes - validated most recent copy scanned in chart (See row information)  No - copy requested No - copy requested No - copy requested    Current Medications (verified) Outpatient Encounter Medications as of 09/16/2021  Medication Sig   acetaminophen (TYLENOL) 500 MG tablet Take 1,000 mg by mouth daily as needed for mild pain or headache.    albuterol (VENTOLIN HFA) 108 (90 Base) MCG/ACT inhaler Inhale 2 puffs into the lungs every 6 (six) hours as needed for wheezing or shortness of breath.   dicyclomine (BENTYL) 20 MG tablet Take 1 tablet (20 mg total) by mouth 4 (four) times daily -  before meals and at bedtime.   famotidine (PEPCID) 20 MG  tablet TAKE 1 TABLET BY MOUTH TWICE A DAY   fluticasone (FLOVENT HFA) 110 MCG/ACT inhaler INHALE 2 PUFFS BY MOUTH INTO THE LUNGS TWICE A DAY.   imipramine (TOFRANIL) 25 MG tablet Take 1 tablet (25 mg total) by mouth 2 (two) times daily.   pantoprazole (PROTONIX) 40 MG tablet TAKE 1 TABLET BY MOUTH TWICE A DAY BEFORE A MEAL   Prucalopride Succinate (MOTEGRITY) 2 MG TABS Take 1 tablet (2 mg total) by mouth daily.   sucralfate (CARAFATE) 1 GM/10ML suspension Take 10 mLs (1 g total) by mouth 4 (four) times daily.   tolterodine (DETROL LA) 4 MG 24 hr capsule Take 1 capsule (4 mg total) by mouth daily.   topiramate (TOPAMAX) 25 MG tablet Take 1 tablet (25 mg total) by mouth 2 (two) times daily.   traMADol (ULTRAM) 50 MG tablet Take 50 mg by mouth every 6 (six) hours as needed. (Patient not taking: Reported on 09/16/2021)   No facility-administered encounter medications on file as of 09/16/2021.    Allergies (verified) Demerol [meperidine]   History: Past Medical History:  Diagnosis Date   Allergy    Arthritis    Asthma    Chicken pox    Complication of anesthesia    GERD (gastroesophageal reflux disease)    History of kidney stones    Migraine headache    PONV (postoperative nausea and vomiting)  Urine incontinence    Past Surgical History:  Procedure Laterality Date   BREAST CYST ASPIRATION Bilateral    neg   carpal tunnel sugery     bilateral   CHOLECYSTECTOMY N/A 11/04/2019   Procedure: LAPAROSCOPIC CHOLECYSTECTOMY WITH INTRAOPERATIVE CHOLANGIOGRAM;  Surgeon: Earline Mayotte, MD;  Location: [facility] ORS;  Service: General;  Laterality: N/A;   COLONOSCOPY WITH PROPOFOL N/A 09/20/2018   Procedure: COLONOSCOPY WITH PROPOFOL;  Surgeon: Scot Jun, MD;  Location: [facility] ENDOSCOPY;  Service: Endoscopy;  Laterality: N/A;   ELBOW SURGERY  1999   ESOPHAGOGASTRODUODENOSCOPY (EGD) WITH PROPOFOL N/A 09/20/2018   Procedure: ESOPHAGOGASTRODUODENOSCOPY (EGD) WITH PROPOFOL;  Surgeon: Scot Jun, MD;  Location: [facility] ENDOSCOPY;  Service: Endoscopy;  Laterality: N/A;   HAND SURGERY  1999   broken finger - pins   NASAL SINUS SURGERY  1992   ROTATOR CUFF REPAIR  1994   Trigger finger surgery  1999 and 2000   VAGINAL HYSTERECTOMY  1987   secondary to fibroids   Family History  Problem Relation Age of Onset   Colon cancer Mother        colon   Asthma Mother    Heart disease Father    Hypertension Father    Colon cancer Sister    Stroke Maternal Grandfather    Diabetes Maternal Grandfather    Lung cancer Maternal Grandmother    Breast cancer Maternal Grandmother    Esophageal cancer Neg Hx    Pancreatic cancer Neg Hx    Liver disease Neg Hx    Stomach cancer Neg Hx    Social History   Socioeconomic History   Marital status: Married    Spouse name: Not on file   Number of children: Not on file   Years of education: Not on file   Highest education level: Not on file  Occupational History   Not on file  Tobacco Use   Smoking status: Never   Smokeless tobacco: Never  Vaping Use   Vaping Use: Never used  Substance and Sexual Activity   Alcohol use: No   Drug use: No   Sexual activity: Yes    Birth control/protection: None, Post-menopausal  Other Topics Concern   Not on file  Social History Narrative   Not on file   Social Determinants of Health   Financial Resource Strain: Low Risk  (09/16/2021)   Overall Financial Resource Strain (CARDIA)    Difficulty of Paying Living Expenses: Not hard at all  Food Insecurity: No Food Insecurity (09/16/2021)   Hunger Vital Sign    Worried About Running Out of Food in the Last Year: Never true    Ran Out of Food in the Last Year: Never true  Transportation Needs: No Transportation Needs (09/16/2021)   PRAPARE - Administrator, Civil Service (Medical): No    Lack of Transportation (Non-Medical): No  Physical Activity: Insufficiently Active (09/16/2021)   Exercise Vital Sign    Days of Exercise per Week:  7 days    Minutes of Exercise per Session: 20 min  Stress: No Stress Concern Present (09/16/2021)   [employer] of Occupational Health - Occupational Stress Questionnaire    Feeling of Stress : Not at all  Social Connections: Unknown (09/16/2021)   Social Connection and Isolation Panel [NHANES]    Frequency of Communication with Friends and Family: More than three times a week    Frequency of Social Gatherings with Friends and Family: More than three times a  week    Attends Religious Services: Not on file    Active Member of Clubs or Organizations: Not on file    Attends Banker Meetings: Not on file    Marital Status: Not on file    Tobacco Counseling Counseling given: Not Answered   Clinical Intake:  Pre-visit preparation completed: Yes       Diabetes: No  How often do you need to have someone help you when you read instructions, pamphlets, or other written materials from your doctor or pharmacy?: 1 - Never  Interpreter Needed?: No    Activities of Daily Living    09/16/2021   12:45 PM  In your present state of health, do you have any difficulty performing the following activities:  Hearing? 0  Vision? 0  Difficulty concentrating or making decisions? 0  Walking or climbing stairs? 0  Dressing or bathing? 0  Doing errands, shopping? 0  Preparing Food and eating ? N  Using the Toilet? N  In the past six months, have you accidently leaked urine? N  Do you have problems with loss of bowel control? N  Managing your Medications? N  Managing your Finances? N  Housekeeping or managing your Housekeeping? N   Patient Care Team: Dale Naples, MD as PCP - General (Internal Medicine)  Indicate any recent Medical Services you may have received from other than [facility] providers in the past year (date may be approximate).     Assessment:   This is a routine wellness examination for Aaleiyah.  Virtual Visit via Telephone Note  I connected with  [name] on 09/16/21 at 12:30 PM EDT by telephone and verified that I am speaking with the correct person using two identifiers.  Persons participating in the virtual visit: patient/Nurse Health Advisor   I discussed the limitations of performing an evaluation and management service by telehealth. We continued and completed visit with audio only. Some vital signs may be absent or patient reported.   Hearing/Vision screen Hearing Screening - Comments:: Patient is able to hear conversational tones without difficulty. No issues reported. Vision Screening - Comments:: Followed by Dr. Clydene Pugh Bilateral cataracts extracted They have seen their ophthalmologist in the last 12 months.  Dietary issues and exercise activities discussed: Current Exercise Habits: Home exercise routine, Type of exercise: walking, Intensity: Mild Healthy diet Good water intake   Goals Addressed             This Visit's Progress    Maintain healthy lifestyle       Stay active Healthy diet/stay hydrated       Depression Screen    09/16/2021   12:38 PM 01/22/2021    8:36 AM 09/14/2020    8:31 AM 05/21/2020    1:38 PM 03/04/2019    8:10 AM 04/02/2017    8:31 AM 04/07/2016    1:20 PM  PHQ 2/9 Scores  PHQ - 2 Score 0 0 0 0 0 0 0    Fall Risk    09/16/2021   12:45 PM 01/22/2021    8:36 AM 09/14/2020    8:33 AM 01/17/2020   10:37 AM 09/19/2019    3:11 PM  Fall Risk   Falls in the past year? 0 0 0 0 0  Number falls in past yr:  0 0 0   Injury with Fall?  0 0 0   Risk for fall due to :  No Fall Risks     Follow up Falls evaluation  completed Falls evaluation completed Falls evaluation completed Falls evaluation completed Falls evaluation completed    FALL RISK PREVENTION PERTAINING TO THE HOME: Home free of loose throw rugs in walkways, pet beds, electrical cords, etc? Yes  Adequate lighting in your home to reduce risk of falls? Yes   ASSISTIVE DEVICES UTILIZED TO PREVENT FALLS: Life alert? No  Use of a  cane, walker or w/c? No   TIMED UP AND GO: Was the test performed? No .   Cognitive Function:  Patient is alert and oriented x3.       Immunizations Immunization History  Administered Date(s) Administered   Fluad Quad(high Dose 65+) 01/17/2020, 01/22/2021   Influenza Split 12/18/2015   Influenza,inj,Quad PF,6+ Mos 01/08/2017, 01/01/2018   Influenza-Unspecified 12/17/2012, 01/05/2014, 01/08/2015, 01/01/2018, 12/23/2018   PFIZER(Purple Top)SARS-COV-2 Vaccination 07/14/2019, 08/09/2019, 04/19/2020   Pfizer Covid-19 Vaccine Bivalent Booster 84yrs & up 12/17/2020   Pneumococcal Conjugate-13 09/19/2019   Pneumococcal Polysaccharide-23 08/19/2016   Zoster Recombinat (Shingrix) 02/09/2019, 05/10/2019    TDAP status: Due, Education has been provided regarding the importance of this vaccine. Advised may receive this vaccine at local pharmacy or Health Dept. Aware to provide a copy of the vaccination record if obtained from local pharmacy or Health Dept. Verbalized acceptance and understanding.  Screening Tests Health Maintenance  Topic Date Due   Pneumonia Vaccine 68+ Years old (3 - PPSV23 if available, else PCV20) 08/19/2021   COVID-19 Vaccine (5 - Pfizer series) 10/02/2021 (Originally 04/18/2021)   TETANUS/TDAP  09/17/2022 (Originally 07/17/1974)   INFLUENZA VACCINE  10/22/2021   MAMMOGRAM  11/29/2021   COLONOSCOPY (Pts 45-67yrs Insurance coverage will need to be confirmed)  09/19/2028   DEXA SCAN  Completed   Hepatitis C Screening  Completed   Zoster Vaccines- Shingrix  Completed   HPV VACCINES  Aged Out   Health Maintenance Health Maintenance Due  Topic Date Due   Pneumonia Vaccine 77+ Years old (3 - PPSV23 if available, else PCV20) 08/19/2021   Lung Cancer Screening: (Low Dose CT Chest recommended if Age 94-80 years, 30 pack-year currently smoking OR have quit w/in 15years.) does not qualify.   Vision Screening: Recommended annual ophthalmology exams for early detection of  glaucoma and other disorders of the eye.  Dental Screening: Recommended annual dental exams for proper oral hygiene  Community Resource Referral / Chronic Care Management: CRR required this visit?  No   CCM required this visit?  No      Plan:   Keep all routine maintenance appointments.   I have personally reviewed and noted the following in the patient's chart:   Medical and social history Use of alcohol, tobacco or illicit drugs  Current medications and supplements including opioid prescriptions.  Functional ability and status Nutritional status Physical activity Advanced directives List of other physicians Hospitalizations, surgeries, and ER visits in previous 12 months Vitals Screenings to include cognitive, depression, and falls Referrals and appointments  In addition, I have reviewed and discussed with patient certain preventive protocols, quality metrics, and best practice recommendations. A written personalized care plan for preventive services as well as general preventive health recommendations were provided to patient.     Ashok Pall, LPN   1/61/0960

## 2021-09-18 ENCOUNTER — Other Ambulatory Visit (INDEPENDENT_AMBULATORY_CARE_PROVIDER_SITE_OTHER): Payer: Medicare HMO

## 2021-09-18 DIAGNOSIS — E7800 Pure hypercholesterolemia, unspecified: Secondary | ICD-10-CM | POA: Diagnosis not present

## 2021-09-18 LAB — BASIC METABOLIC PANEL
BUN: 14 mg/dL (ref 6–23)
CO2: 25 mEq/L (ref 19–32)
Calcium: 9.1 mg/dL (ref 8.4–10.5)
Chloride: 106 mEq/L (ref 96–112)
Creatinine, Ser: 0.99 mg/dL (ref 0.40–1.20)
GFR: 59.14 mL/min — ABNORMAL LOW (ref >60.00)
Glucose, Bld: 94 mg/dL (ref 70–99)
Potassium: 4.1 mEq/L (ref 3.5–5.1)
Sodium: 139 mEq/L (ref 135–145)

## 2021-09-18 LAB — CBC WITH DIFFERENTIAL/PLATELET
Basophils Absolute: 0 10*3/uL (ref 0.0–0.1)
Basophils Relative: 0.5 % (ref 0.0–3.0)
Eosinophils Absolute: 0.5 10*3/uL (ref 0.0–0.7)
Eosinophils Relative: 10 % — ABNORMAL HIGH (ref 0.0–5.0)
HCT: 39.9 % (ref 36.0–46.0)
Hemoglobin: 13.2 g/dL (ref 12.0–15.0)
Lymphocytes Relative: 29.3 % (ref 12.0–46.0)
Lymphs Abs: 1.4 10*3/uL (ref 0.7–4.0)
MCHC: 33.1 g/dL (ref 30.0–36.0)
MCV: 85.7 fl (ref 78.0–100.0)
Monocytes Absolute: 0.3 10*3/uL (ref 0.1–1.0)
Monocytes Relative: 6.8 % (ref 3.0–12.0)
Neutro Abs: 2.5 10*3/uL (ref 1.4–7.7)
Neutrophils Relative %: 53.4 % (ref 43.0–77.0)
Platelets: 219 10*3/uL (ref 150.0–400.0)
RBC: 4.66 Mil/uL (ref 3.87–5.11)
RDW: 14.7 % (ref 11.5–15.5)
WBC: 4.7 10*3/uL (ref 4.0–10.5)

## 2021-09-18 LAB — HEPATIC FUNCTION PANEL
ALT: 18 U/L (ref 0–35)
AST: 17 U/L (ref 0–37)
Albumin: 4.2 g/dL (ref 3.5–5.2)
Alkaline Phosphatase: 130 U/L — ABNORMAL HIGH (ref 39–117)
Bilirubin, Direct: 0.1 mg/dL (ref 0.0–0.3)
Total Bilirubin: 0.5 mg/dL (ref 0.2–1.2)
Total Protein: 6.6 g/dL (ref 6.0–8.3)

## 2021-09-18 LAB — LIPID PANEL
Cholesterol: 220 mg/dL — ABNORMAL HIGH (ref 0–200)
HDL: 90.1 mg/dL (ref >39.00)
LDL Cholesterol: 115 mg/dL — ABNORMAL HIGH (ref 0–99)
NonHDL: 129.84
Total CHOL/HDL Ratio: 2
Triglycerides: 75 mg/dL (ref 0.0–149.0)
VLDL: 15 mg/dL (ref 0.0–40.0)

## 2021-09-23 ENCOUNTER — Encounter: Payer: Self-pay | Admitting: Internal Medicine

## 2021-09-23 ENCOUNTER — Ambulatory Visit (INDEPENDENT_AMBULATORY_CARE_PROVIDER_SITE_OTHER): Payer: Medicare HMO | Admitting: Internal Medicine

## 2021-09-23 VITALS — BP 124/80 | HR 80 | Temp 98.5°F | Resp 17 | Ht 63.0 in | Wt 195.4 lb

## 2021-09-23 DIAGNOSIS — Z23 Encounter for immunization: Secondary | ICD-10-CM | POA: Diagnosis not present

## 2021-09-23 DIAGNOSIS — Z0000 Encounter for general adult medical examination without abnormal findings: Secondary | ICD-10-CM | POA: Diagnosis not present

## 2021-09-23 DIAGNOSIS — K219 Gastro-esophageal reflux disease without esophagitis: Secondary | ICD-10-CM

## 2021-09-23 DIAGNOSIS — R1084 Generalized abdominal pain: Secondary | ICD-10-CM | POA: Diagnosis not present

## 2021-09-23 DIAGNOSIS — Z800 Family history of malignant neoplasm of digestive organs: Secondary | ICD-10-CM

## 2021-09-23 DIAGNOSIS — E7800 Pure hypercholesterolemia, unspecified: Secondary | ICD-10-CM | POA: Diagnosis not present

## 2021-09-23 DIAGNOSIS — J4520 Mild intermittent asthma, uncomplicated: Secondary | ICD-10-CM | POA: Diagnosis not present

## 2021-09-23 DIAGNOSIS — R748 Abnormal levels of other serum enzymes: Secondary | ICD-10-CM | POA: Diagnosis not present

## 2021-09-23 NOTE — Assessment & Plan Note (Addendum)
The 10-year ASCVD risk score (Arnett DK, et al., 2019) is: 5.8%   Values used to calculate the score:     Age: 66 years     Sex: Female     Is Non-Hispanic African American: No     Diabetic: No     Tobacco smoker: No     Systolic Blood Pressure: 124 mmHg     Is BP treated: No     HDL Cholesterol: 90.1 mg/dL     Total Cholesterol: 220 mg/dL  Low cholesterol diet and exercise.  Follow lipid panel.

## 2021-09-23 NOTE — Assessment & Plan Note (Signed)
Physical today 09/23/21.  Mammogram 11/30/20 - Birads I.  Colonoscopy 08/2018.  Recommended f/u in 2025 (Dr Orvan Falconer)

## 2021-09-23 NOTE — Progress Notes (Signed)
Patient ID: Savannah Gomez, female   DOB: December 16, 1955, 66 y.o.   MRN: [id number]   Subjective:    Patient ID: Savannah Gomez, female    DOB: 06/28/1955, 66 y.o.   MRN: [id number]   Patient here for her physical exam.     HPI Seeing GI - persistent GI issues.  Nausea and abdominal pain.  Reflux.  Normal gastric emptying study.  Chronic constipation.  Miralax did not help.  Amitiza caused diarrhea.  Continue motegrity.  No extrahepatic biliary dilatation on ultrasound, CT or MRI.  Isoenzymes normal.  F/u alkaline phosphatase normal.  Last saw Dr Tarri Glenn 07/16/21 - recommended continuing protonix 40mg  bid and famotidine 20mg  bid and carafate.  Recommended continuing montegrity.  No change on dicyclomine.  Weaning off.  Has f/u planned 10/26/21.  Reports persistent GI issues.  Abdominal discomfort after eating.  Also reports yellow stools.  States other than her GI issues, she has been doing relatively well.  No chest pain.  Breathing stable.  No increased cough or congestion.  Saw Dr Ubaldo Glassing 05/2021.  ECHO - no significant abnormality.  EF 55%.  Discussed pneumonia vaccine.    Past Medical History:  Diagnosis Date   Allergy    Arthritis    Asthma    Chicken pox    Complication of anesthesia    GERD (gastroesophageal reflux disease)    History of kidney stones    Migraine headache    PONV (postoperative nausea and vomiting)    Urine incontinence    Past Surgical History:  Procedure Laterality Date   BREAST CYST ASPIRATION Bilateral    neg   carpal tunnel sugery     bilateral   CHOLECYSTECTOMY N/A 11/04/2019   Procedure: LAPAROSCOPIC CHOLECYSTECTOMY WITH INTRAOPERATIVE CHOLANGIOGRAM;  Surgeon: Robert Bellow, MD;  Location: [facility] ORS;  Service: General;  Laterality: N/A;   COLONOSCOPY WITH PROPOFOL N/A 09/20/2018   Procedure: COLONOSCOPY WITH PROPOFOL;  Surgeon: Manya Silvas, MD;  Location: [facility] ENDOSCOPY;  Service: Endoscopy;  Laterality: N/A;   ELBOW SURGERY  1999    ESOPHAGOGASTRODUODENOSCOPY (EGD) WITH PROPOFOL N/A 09/20/2018   Procedure: ESOPHAGOGASTRODUODENOSCOPY (EGD) WITH PROPOFOL;  Surgeon: Manya Silvas, MD;  Location: [facility] ENDOSCOPY;  Service: Endoscopy;  Laterality: N/A;   HAND SURGERY  1999   broken finger - pins   NASAL SINUS SURGERY  1992   ROTATOR CUFF REPAIR  1994   Trigger finger surgery  1999 and 2000   VAGINAL HYSTERECTOMY  1987   secondary to fibroids   Family History  Problem Relation Age of Onset   Colon cancer Mother        colon   Asthma Mother    Heart disease Father    Hypertension Father    Colon cancer Sister    Stroke Maternal Grandfather    Diabetes Maternal Grandfather    Lung cancer Maternal Grandmother    Breast cancer Maternal Grandmother    Esophageal cancer Neg Hx    Pancreatic cancer Neg Hx    Liver disease Neg Hx    Stomach cancer Neg Hx    Social History   Socioeconomic History   Marital status: Married    Spouse name: Not on file   Number of children: Not on file   Years of education: Not on file   Highest education level: Not on file  Occupational History   Not on file  Tobacco Use   Smoking status: Never   Smokeless tobacco: Never  Vaping Use  Vaping Use: Never used  Substance and Sexual Activity   Alcohol use: No   Drug use: No   Sexual activity: Yes    Birth control/protection: None, Post-menopausal  Other Topics Concern   Not on file  Social History Narrative   Not on file   Social Determinants of Health   Financial Resource Strain: Low Risk  (09/16/2021)   Overall Financial Resource Strain (CARDIA)    Difficulty of Paying Living Expenses: Not hard at all  Food Insecurity: No Food Insecurity (09/16/2021)   Hunger Vital Sign    Worried About Running Out of Food in the Last Year: Never true    Ran Out of Food in the Last Year: Never true  Transportation Needs: No Transportation Needs (09/16/2021)   PRAPARE - Hydrologist (Medical): No    Lack of  Transportation (Non-Medical): No  Physical Activity: Insufficiently Active (09/16/2021)   Exercise Vital Sign    Days of Exercise per Week: 7 days    Minutes of Exercise per Session: 20 min  Stress: No Stress Concern Present (09/16/2021)   [place]    Feeling of Stress : Not at all  Social Connections: Unknown (09/16/2021)   Social Connection and Isolation Panel [NHANES]    Frequency of Communication with Friends and Family: More than three times a week    Frequency of Social Gatherings with Friends and Family: More than three times a week    Attends Religious Services: Not on Advertising copywriter or Organizations: Not on file    Attends Archivist Meetings: Not on file    Marital Status: Not on file     Review of Systems  Constitutional:  Negative for appetite change and unexpected weight change.  HENT:  Negative for congestion, sinus pressure and sore throat.   Eyes:  Negative for pain and visual disturbance.  Respiratory:  Negative for cough, chest tightness and shortness of breath.   Cardiovascular:  Negative for chest pain, palpitations and leg swelling.  Gastrointestinal:  Positive for abdominal pain. Negative for vomiting.       Bowel issues as outlined.   Genitourinary:  Negative for difficulty urinating and dysuria.  Musculoskeletal:  Negative for joint swelling and myalgias.  Skin:  Negative for color change and rash.  Neurological:  Negative for dizziness, light-headedness and headaches.  Hematological:  Negative for adenopathy. Does not bruise/bleed easily.  Psychiatric/Behavioral:  Negative for agitation and dysphoric mood.        Objective:     BP 124/80 (BP Location: Left Arm, Patient Position: Sitting, Cuff Size: Small)   Pulse 80   Temp 98.5 F (36.9 C) (Temporal)   Resp 17   Ht 5\' 3"  (1.6 m)   Wt 195 lb 6.4 oz (88.6 kg)   SpO2 100%   BMI 34.61 kg/m  Wt Readings from  Last 3 Encounters:  09/23/21 195 lb 6.4 oz (88.6 kg)  09/16/21 192 lb (87.1 kg)  07/16/21 192 lb 4 oz (87.2 kg)    Physical Exam Vitals reviewed.  Constitutional:      General: She is not in acute distress.    Appearance: Normal appearance. She is well-developed.  HENT:     Head: Normocephalic and atraumatic.     Right Ear: External ear normal.     Left Ear: External ear normal.  Eyes:     General: No scleral icterus.  Right eye: No discharge.        Left eye: No discharge.     Conjunctiva/sclera: Conjunctivae normal.  Neck:     Thyroid: No thyromegaly.  Cardiovascular:     Rate and Rhythm: Normal rate and regular rhythm.  Pulmonary:     Effort: No tachypnea, accessory muscle usage or respiratory distress.     Breath sounds: Normal breath sounds. No decreased breath sounds or wheezing.  Chest:  Breasts:    Right: No inverted nipple, mass, nipple discharge or tenderness (no axillary adenopathy).     Left: No inverted nipple, mass, nipple discharge or tenderness (no axilarry adenopathy).  Abdominal:     General: Bowel sounds are normal.     Palpations: Abdomen is soft.     Tenderness: There is no abdominal tenderness.  Musculoskeletal:        General: No swelling or tenderness.     Cervical back: Neck supple.  Lymphadenopathy:     Cervical: No cervical adenopathy.  Skin:    Findings: No erythema or rash.  Neurological:     Mental Status: She is alert and oriented to person, place, and time.  Psychiatric:        Mood and Affect: Mood normal.        Behavior: Behavior normal.      Outpatient Encounter Medications as of 09/23/2021  Medication Sig   acetaminophen (TYLENOL) 500 MG tablet Take 1,000 mg by mouth daily as needed for mild pain or headache.    albuterol (VENTOLIN HFA) 108 (90 Base) MCG/ACT inhaler Inhale 2 puffs into the lungs every 6 (six) hours as needed for wheezing or shortness of breath.   famotidine (PEPCID) 20 MG tablet TAKE 1 TABLET BY MOUTH  TWICE A DAY   fluticasone (FLOVENT HFA) 110 MCG/ACT inhaler INHALE 2 PUFFS BY MOUTH INTO THE LUNGS TWICE A DAY.   imipramine (TOFRANIL) 25 MG tablet Take 1 tablet (25 mg total) by mouth 2 (two) times daily.   pantoprazole (PROTONIX) 40 MG tablet TAKE 1 TABLET BY MOUTH TWICE A DAY BEFORE A MEAL   Prucalopride Succinate (MOTEGRITY) 2 MG TABS Take 1 tablet (2 mg total) by mouth daily.   sucralfate (CARAFATE) 1 GM/10ML suspension Take 10 mLs (1 g total) by mouth 4 (four) times daily.   tolterodine (DETROL LA) 4 MG 24 hr capsule Take 1 capsule (4 mg total) by mouth daily.   topiramate (TOPAMAX) 25 MG tablet Take 1 tablet (25 mg total) by mouth 2 (two) times daily.   traMADol (ULTRAM) 50 MG tablet Take 50 mg by mouth every 6 (six) hours as needed.   [DISCONTINUED] dicyclomine (BENTYL) 20 MG tablet Take 1 tablet (20 mg total) by mouth 4 (four) times daily -  before meals and at bedtime. (Patient not taking: Reported on 09/23/2021)   No facility-administered encounter medications on file as of 09/23/2021.     Lab Results  Component Value Date   WBC 4.7 09/18/2021   HGB 13.2 09/18/2021   HCT 39.9 09/18/2021   PLT 219.0 09/18/2021   GLUCOSE 94 09/18/2021   CHOL 220 (H) 09/18/2021   TRIG 75.0 09/18/2021   HDL 90.10 09/18/2021   LDLCALC 115 (H) 09/18/2021   ALT 18 09/18/2021   AST 17 09/18/2021   NA 139 09/18/2021   K 4.1 09/18/2021   CL 106 09/18/2021   CREATININE 0.99 09/18/2021   BUN 14 09/18/2021   CO2 25 09/18/2021   TSH 2.62 01/18/2021   HGBA1C 5.4  10/03/2019    CT Abdomen Pelvis W Contrast  Result Date: 05/28/2021 CLINICAL DATA:  Nausea, vomiting, bloating and abdominal pain. EXAM: CT ABDOMEN AND PELVIS WITH CONTRAST TECHNIQUE: Multidetector CT imaging of the abdomen and pelvis was performed using the standard protocol following bolus administration of intravenous contrast. RADIATION DOSE REDUCTION: This exam was performed according to the departmental dose-optimization program which  includes automated exposure control, adjustment of the mA and/or kV according to patient size and/or use of iterative reconstruction technique. CONTRAST:  OMNIPAQUE IOHEXOL 300 MG/ML  SOLN COMPARISON:  Jul 26, 2018 FINDINGS: Lower chest: No acute abnormality. Hepatobiliary: There is diffuse fatty infiltration of the liver parenchyma. No focal liver abnormality is seen. Status post cholecystectomy. No biliary dilatation. Pancreas: Unremarkable. No pancreatic ductal dilatation or surrounding inflammatory changes. Spleen: Normal in size without focal abnormality. Adrenals/Urinary Tract: Adrenal glands are unremarkable. Kidneys are normal in size, without obstructing renal calculi. There is mild, stable prominence of the right renal pelvis and proximal right ureter. Stable, bilateral subcentimeter renal cysts are noted. The nonobstructing left renal calculus seen on the prior study is no longer visualized. The urinary bladder is poorly distended and subsequently limited in evaluation. Stomach/Bowel: There is a small hiatal hernia. The appendix is not visualized. Stool is seen throughout the large bowel. No evidence of bowel wall thickening, distention, or inflammatory changes. Noninflamed diverticula are seen within the descending and sigmoid colon. Vascular/Lymphatic: No significant vascular findings are present. No enlarged abdominal or pelvic lymph nodes. Reproductive: Status post hysterectomy. No adnexal masses. Other: No abdominal wall hernia or abnormality. No abdominopelvic ascites. Musculoskeletal: Multilevel degenerative changes are seen throughout the lumbar spine. IMPRESSION: 1. Hepatic steatosis. 2. Evidence of prior cholecystectomy and hysterectomy. 3. Small hiatal hernia. 4. Colonic diverticulosis. Electronically Signed   By: Aram Candela M.D.   On: 05/28/2021 03:44       Assessment & Plan:   Problem List Items Addressed This Visit     Abdominal pain    Persistent intermittent abdominal  pain and GI/bowel issues as outlined.  Continues on PPI/H2 blocker.  Also carafate.  No improvement with FD Guard/dicyclomine.  Persistent intermittent yellow stools.  Has f/u with Dr Orvan Falconer in 10/2021.  D/w her regarding further testing prior to August.       Asthma    Breathing appears to be stable.  Has rescue inhaler if needed.  Chest x-ray November 2021 revealed no acute abnormality. Due f/u with Dr Belia Heman - 11/2021.       Elevated alkaline phosphatase level    Alkaline phos level 05/20/21 - wnl      Family history of colon cancer    Reviewed.  Colonoscopy 2020. Due 2025.        GERD (gastroesophageal reflux disease)    EGD February 2022-gastritis.  Continue proton pump inhibitor and Pepcid.  Carafate added.  Continues to follow-up with GI (Dr. Orvan Falconer). W/up as outlined.  Normal gastric emptying study. Has f/u in 10/2021.  Discuss with her regarding any further testing needed prior to appt.       Health care maintenance    Physical today 09/23/21.  Mammogram 11/30/20 - Birads I.  Colonoscopy 08/2018.  Recommended f/u in 2025 (Dr Orvan Falconer)      Hypercholesteremia    The 10-year ASCVD risk score (Arnett DK, et al., 2019) is: 5.8%   Values used to calculate the score:     Age: 33 years     Sex: Female  Is Non-Hispanic African American: No     Diabetic: No     Tobacco smoker: No     Systolic Blood Pressure: 124 mmHg     Is BP treated: No     HDL Cholesterol: 90.1 mg/dL     Total Cholesterol: 220 mg/dL  Low cholesterol diet and exercise.  Follow lipid panel.       Relevant Orders   CBC with Differential/Platelet   Basic metabolic panel   TSH   Hepatic function panel   Lipid panel   Other Visit Diagnoses     Routine general medical examination at a health care facility    -  Primary   Need for Streptococcus pneumoniae vaccination       Relevant Orders   Pneumococcal polysaccharide vaccine 23-valent greater than or equal to 2yo subcutaneous/IM (Completed)         Dale [place], MD

## 2021-09-29 ENCOUNTER — Encounter: Payer: Self-pay | Admitting: Internal Medicine

## 2021-09-29 NOTE — Assessment & Plan Note (Signed)
EGD February 2022-gastritis.  Continue proton pump inhibitor and Pepcid.  Carafate added.  Continues to follow-up with GI (Dr. Orvan Falconer). W/up as outlined.  Normal gastric emptying study. Has f/u in 10/2021.  Discuss with her regarding any further testing needed prior to appt.

## 2021-09-29 NOTE — Assessment & Plan Note (Signed)
Breathing appears to be stable.  Has rescue inhaler if needed.  Chest x-ray November 2021 revealed no acute abnormality. Due f/u with Dr Belia Heman - 11/2021.

## 2021-09-29 NOTE — Assessment & Plan Note (Signed)
Persistent intermittent abdominal pain and GI/bowel issues as outlined.  Continues on PPI/H2 blocker.  Also carafate.  No improvement with FD Guard/dicyclomine.  Persistent intermittent yellow stools.  Has f/u with Dr Orvan Falconer in 10/2021.  D/w her regarding further testing prior to August.

## 2021-09-29 NOTE — Assessment & Plan Note (Signed)
Alkaline phos level 05/20/21 - wnl

## 2021-09-29 NOTE — Assessment & Plan Note (Signed)
Reviewed.  Colonoscopy 2020. Due 2025.   

## 2021-10-02 ENCOUNTER — Telehealth: Payer: Self-pay | Admitting: Internal Medicine

## 2021-10-02 NOTE — Telephone Encounter (Signed)
-----   Message from Tressia Danas, MD sent at 09/30/2021 10:11 AM EDT ----- Regarding: RE: follow up and question Thanks for the follow-up. 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 I'm happy to discuss this with her during her upcoming visit if you don't want to start it earlier.   Cala Bradford ----- Message ----- From: Dale [place], MD Sent: 09/29/2021   7:30 PM EDT To: Tressia Danas, MD Subject: follow up and question                         I saw Ms Nappi for a follow up appointment.  She continues to have increased abdominal pain after eating.  Also, persistent bowel changes and intermittent yellow stool.  I know she has had various tests.  Currently on PPI bid and H2 blocker in the evening.  (Trial of carafate as well).  Weaning off dicyclomine.  She has a follow up appointment with you in 10/2021.  I wanted to give you an update and see if there is anything that you would recommend prior to her appointment with you.  Thank you for seeing her and helping take care of her.  I really appreciate it.    Dale [place]

## 2021-10-02 NOTE — Telephone Encounter (Signed)
Please call Savannah Gomez and notify her that I did contact Dr Orvan Falconer and she suggested a trial of buspar prior to meals.  She had suggested 10mg  before meals.  I can start with 5mg  before meals if she is agreeable to start.  I do need to know if she is still taking tramadol.  If so, how often is she taking?

## 2021-10-03 ENCOUNTER — Other Ambulatory Visit: Payer: Self-pay

## 2021-10-03 MED ORDER — BUSPIRONE HCL 5 MG PO TABS: ORAL_TABLET | ORAL | 0 refills | Status: DC

## 2021-10-03 NOTE — Telephone Encounter (Signed)
S/w pt - agreeable to start 5mg  buspar prior to meals to see how she tolerates. Pt states she is not taking tramadol at all at this time, has not needed it for several months.  Rx for Buspar 5mg  1 pill prior to meals sent in to pt local pharm.

## 2021-10-16 ENCOUNTER — Other Ambulatory Visit: Payer: Self-pay | Admitting: Internal Medicine

## 2021-10-16 DIAGNOSIS — Z1231 Encounter for screening mammogram for malignant neoplasm of breast: Secondary | ICD-10-CM

## 2021-10-22 ENCOUNTER — Other Ambulatory Visit: Payer: Self-pay | Admitting: Gastroenterology

## 2021-10-23 ENCOUNTER — Ambulatory Visit: Payer: Medicare HMO | Admitting: Gastroenterology

## 2021-10-23 ENCOUNTER — Encounter: Payer: Self-pay | Admitting: Gastroenterology

## 2021-10-23 VITALS — BP 142/86 | HR 74 | Ht 63.0 in | Wt 196.0 lb

## 2021-10-23 DIAGNOSIS — R109 Unspecified abdominal pain: Secondary | ICD-10-CM

## 2021-10-23 DIAGNOSIS — R748 Abnormal levels of other serum enzymes: Secondary | ICD-10-CM | POA: Diagnosis not present

## 2021-10-23 DIAGNOSIS — R112 Nausea with vomiting, unspecified: Secondary | ICD-10-CM | POA: Diagnosis not present

## 2021-10-23 DIAGNOSIS — K5909 Other constipation: Secondary | ICD-10-CM | POA: Diagnosis not present

## 2021-10-23 NOTE — Patient Instructions (Addendum)
It was a pleasure to see you today.  Please keep a "food diary" to identify any foods that may be triggering your abdominal pain.  Continue your pantoprazole 40 mg twice daily.  Continue famotidine 20 mg twice daily.  Continue Carafate (sucralfate)  Continue Motegrity 2 mg daily.  Please use miralax or magnesium oxide over the counter.  You have been scheduled to see Dr Orvan Falconer in follow up on 01/16/22 at 2:10 pm.  Continue Buspar 5 mg prior to meals. If after two weeks your pain has not further improved, we could increase the dose to 10 mg prior to meals.   If you are having constipation despite the Motegrity:    - Natural laxatives include prunes, apples, apricots, cherries, peaches, pears, aloe, rhubarb, kiwi, bananas, mango, papaya, and watermelon. In particular, two kiwi a day has been show to cause less likely to cause bloating than prunes or psyllium.    - As long as you have healthy kidneys, another options is using magnesium oxide supplements. I recommend starting with 500 mg daily. You could increase the dose to 1000 mg daily after one week if that doesn't seem to be helping.    If you are age 23 or older, your body mass index should be between 23-30. Your Body mass index is 34.72 kg/m. If this is out of the aforementioned range listed, please consider follow up with your Primary Care Provider.  ________________________________________________________  The [facility] providers would like to encourage you to use [facility] to communicate with providers for non-urgent requests or questions.  Due to long hold times on the telephone, sending your provider a message by [facility] may be a faster and more efficient way to get a response.  Please allow 48 business hours for a response.  Please remember that this is for non-urgent requests.  _______________________________________________________  Due to recent changes in healthcare laws, you may see the results of your imaging and  laboratory studies on MyChart before your provider has had a chance to review them.  We understand that in some cases there may be results that are confusing or concerning to you. Not all laboratory results come back in the same time frame and the provider may be waiting for multiple results in order to interpret others.  Please give [place] 48 hours in order for your provider to thoroughly review all the results before contacting the office for clarification of your results.

## 2021-10-23 NOTE — Progress Notes (Signed)
Referring Provider: Einar Pheasant, MD Primary Care Physician:  Einar Pheasant, MD  Chief complaint:  Abdominal pain, nausea, constipation   IMPRESSION:  Nausea and abdominal pain     - ? Functional dyspepsia History of mildly elevated abnormal alk phos     - normal alk phos isoenzymes    - other liver enzymes are normal Reflux and H pylori negative gastritis Moderate pancreatic atrophy by MRI Chronic constipation with some improvement on Motegrity Colonic diverticulosis by CT Family history of colon cancer (mother at age 61, sister in her 35s) Cholecystectomy  Nausea and abdominal pain.  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 Reflux and gastritis thought to be contributing in the past. Normal gastric emptying scan. No symptomatic improvement despite PPI BID and H2B, dicyclomine, more aggressively treating constipation, trial of FDGuard and peppermint.  Chronic constipation: Insurance denied Linzess. Miralax did not help. Amitiza caused diarrhea. Continue Motegrity. Use dietary recommendations for additional improvement (see patient instructions)  Family of colon cancer: Last colonoscopy 2020. Repeat colonoscopy in 2025, earlier with new symptoms.   Elevated alkaline phosphatase without extrahepatic biliary dilatation on ultrasound, CT, or MRI.  Isoenzymes normal. Follow-up alk phos normal. Recent testing shows slight increase. Continue close monitoring. Planning liver biopsy if alk phos levels rise.   PLAN: - Continue pantoprazole 40 mg BID and famotidine 20 mg BID and Carafate - Continue Motegrity 2 mg daily - Will use Miralax or magnesium oxide or diet options to improve constipation (see patient instructions) - Continue buspirone 5 mg prior to meals, increase to 10 mg TID if no additional relief over the next 2-4 weeks - Could consider pancreatic enzymes - Food diary to identify food triggers - Office follow-up in 3-4 weeks, earlier if needed  Please see the "Patient  Instructions" section for addition details about the plan.  HPI: Savannah Gomez is a 66 y.o. female who returns in follow-up for evaluation of abdominal pain, nausea, and constipation. She was last seen in the office 07/16/21. The interval history is obtained through the patient and review of her electronic health record.  Initially referred by Dr. Nicki Reaper for an isolated elevated alk phos first identified in 2019. No change in alk phos seen after cholecystectomy 11/04/19 for chronic cholecystitis, cholelithiasis although the magnitude of the elevation has fluctuated over the years. However, most recently the alk phos was normal. Evaluation including labs, abdominal ultrasound, contrast CT, and MRI/MRCP was remarked only for moderate pancreatic atrophy. Pancreatic elastase was normal.  She had an EGD 05/01/2020 to evaluate multiple chronic GI symptoms including regurgitation, nausea, globus and chest pain that radiated to the back despite pantoprazole 40 mg BID for several years for reflux showed reflux esophagitis, gastritis, fundic gland polyps, and retained food.  Duodenal biopsies were normal.  There was no H. pylori.  I recommended that she continue her PPI twice daily and famotidine twice daily.  She was asked to avoid all NSAIDs.  Gastric emptying scan 05/28/2020 was normal  At time of her office visit 11/02/20 her primary complaint was regurgitation, nausea, and epigastric pain - not nearly as bad. Having a bowel movement weekly. GI symptoms may worsen as constipation progresses. Sense of complete evacuation. No improvement with Linzess. Miralax caused loose stools.  Insurance would not cover Linzess. She tried some samples but this did not provide relief.   Alpha gal testing negative 01/22/21  When seen in follow-up 05/20/21 she reported abdominal pain and nausea - especially after meals.  There was also some nocturnal nausea,  although she's not sure if the nausea wakes her from her sleep. Symptoms  are worse than when she was here in 2022 and particularly bad after eating or soon after eating. Pain starts in the upper epigastrium and radiates to the RUQ. No heartburn.  Her fatigue is also worse, and she thinks this may be due to poor sleep related to the nausea.   Trial of Amitiza caused severe abdominal cramping.  She doesn't remember taking FDGuard, which was recommended at the time of her last visit. Continues on pantoprazole 40 mg BID, famotidine 20 mg BID, and Carafate PRN although she is out of Carafate.   CT abd/pelvis with contrast 05/27/21: fatty liver, prior cholecystectomy, small hiatal hernia, colonic diverticulosis  Office follow-up 07/16/21: Trial of dicyclomine and Motegrity may provide some relief. Otherwise, non-prescription treatments as discussed at the time of her last office visit have not provided any meaningful relief.   Returns today in scheduled follow-up after starting Buspar 74m prior to meals 10/03/21. She has been pain free for 4 days this week - which is an improvement.  Despite Motegrity she will have a bowel movement every day to several days. Some progression in pain as the constipation worsens.    Endoscopic history: - EGD for RUQ pain and [facility], no H pylori, small hyperplastic polyp, diverticulosis, small internal hemorrhoids - EGD 05/01/2020 showed reflux, gastritis, fundic gland polyps, and retained food.  Duodenal biopsies were normal.  There was no H. pylori.  I recommended that she continue her PPI twice daily and famotidine twice daily.  She was asked to avoid all NSAIDs.  Labs 09/18/21 showed a normal CBC, normal hepatic function panel except for alk phos of 130  Past Medical History:  Diagnosis Date   Allergy    Arthritis    Asthma    Chicken pox    Complication of anesthesia    GERD (gastroesophageal reflux disease)    History of kidney stones    Migraine headache    PONV  (postoperative nausea and vomiting)    Urine incontinence     Past Surgical History:  Procedure Laterality Date   BREAST CYST ASPIRATION Bilateral    neg   carpal tunnel sugery     bilateral   CHOLECYSTECTOMY N/A 11/04/2019   Procedure: LAPAROSCOPIC CHOLECYSTECTOMY WITH INTRAOPERATIVE CHOLANGIOGRAM;  Surgeon: BRobert Bellow MD;  Location: [facility] ORS;  Service: General;  Laterality: N/A;   COLONOSCOPY WITH PROPOFOL N/A 09/20/2018   Procedure: COLONOSCOPY WITH PROPOFOL;  Surgeon: EManya Silvas MD;  [facility];  Service: Endoscopy;  Laterality: N/A;   ELBOW SURGERY  1999   ESOPHAGOGASTRODUODENOSCOPY (EGD) WITH PROPOFOL N/A 09/20/2018   Procedure: ESOPHAGOGASTRODUODENOSCOPY (EGD) WITH PROPOFOL;  Surgeon: EManya Silvas MD;  [facility];  Service: Endoscopy;  Laterality: N/A;   HAND SURGERY  1999   broken finger - pins   NASAL SINUS SURGERY  1992   ROTATOR CUFF REPAIR  1994   Trigger finger surgery  1999 and [place]  secondary to fibroids    Current Outpatient Medications  Medication Sig Dispense Refill   acetaminophen (TYLENOL) 500 MG tablet Take 1,000 mg by mouth daily as needed for mild pain or headache.      albuterol (VENTOLIN HFA) 108 (90 Base) MCG/ACT inhaler Inhale 2 puffs into the lungs every 6 (six) hours as needed for wheezing or shortness of breath. 1  each 10   busPIRone (BUSPAR) 5 MG tablet I tablet prior to meals daily 90 tablet 0   famotidine (PEPCID) 20 MG tablet TAKE 1 TABLET BY MOUTH TWICE A DAY 60 tablet 3   fluticasone (FLOVENT HFA) 110 MCG/ACT inhaler INHALE 2 PUFFS BY MOUTH INTO THE LUNGS TWICE A DAY. 12 g 10   imipramine (TOFRANIL) 25 MG tablet Take 1 tablet (25 mg total) by mouth 2 (two) times daily. 180 tablet 3   pantoprazole (PROTONIX) 40 MG tablet TAKE 1 TABLET BY MOUTH TWICE A DAY BEFORE A MEAL 180 tablet 1   Prucalopride Succinate (MOTEGRITY) 2 MG TABS Take 1 tablet (2 mg total) by mouth daily.  90 tablet 2   sucralfate (CARAFATE) 1 GM/10ML suspension Take 10 mLs (1 g total) by mouth 4 (four) times daily. 1200 mL 3   tolterodine (DETROL LA) 4 MG 24 hr capsule Take 1 capsule (4 mg total) by mouth daily. 90 capsule 3   topiramate (TOPAMAX) 25 MG tablet Take 1 tablet (25 mg total) by mouth 2 (two) times daily. 180 tablet 1   No current facility-administered medications for this visit.    Allergies as of 10/23/2021 - Review Complete 10/23/2021  Allergen Reaction Noted   Demerol [meperidine] Nausea Only 01/16/2012     Physical Exam: Weight 05/20/21 189 pounds Weight 08/15/21: 192 pounds General:   Alert,  well-nourished, pleasant and cooperative in NAD Head:  Normocephalic and atraumatic. Eyes:  Sclera clear, no icterus.   Conjunctiva pink. No xanthelasma.  Abdomen:  Soft, I am unable to reproduce her pain but she localizes it to the midline port scar from her cholecystectomy, nondistended, normal bowel sounds, no rebound or guarding. No hepatosplenomegaly.  Neurologic:  Alert and  oriented x4;  grossly nonfocal Skin: No rash or bruise. No spider angioma, palmar erythema, or Terry nails.  Psych:  Alert and cooperative. Normal mood and affect.     Quinteria Chisum L. Tarri Glenn, MD, MPH 10/23/2021, 9:36 AM

## 2021-10-29 ENCOUNTER — Other Ambulatory Visit: Payer: Self-pay | Admitting: Internal Medicine

## 2021-11-20 ENCOUNTER — Other Ambulatory Visit: Payer: Self-pay | Admitting: *Deleted

## 2021-11-20 ENCOUNTER — Ambulatory Visit
Admission: RE | Admit: 2021-11-20 | Discharge: 2021-11-20 | Disposition: A | Discharge status: Home / Self Care | Payer: Medicare HMO | Attending: Urology | Admitting: Urology

## 2021-11-20 ENCOUNTER — Ambulatory Visit
Admission: RE | Admit: 2021-11-20 | Discharge: 2021-11-20 | Disposition: A | Discharge status: Home / Self Care | Payer: Medicare HMO | Source: Ambulatory Visit | Attending: Urology | Admitting: Urology

## 2021-11-20 DIAGNOSIS — N200 Calculus of kidney: Secondary | ICD-10-CM | POA: Diagnosis not present

## 2021-11-20 DIAGNOSIS — M419 Scoliosis, unspecified: Secondary | ICD-10-CM | POA: Diagnosis not present

## 2021-11-22 ENCOUNTER — Encounter: Payer: Self-pay | Admitting: Urology

## 2021-11-22 ENCOUNTER — Ambulatory Visit: Payer: Medicare HMO | Admitting: Urology

## 2021-11-22 VITALS — BP 147/68 | HR 86 | Ht 63.0 in | Wt 190.0 lb

## 2021-11-22 DIAGNOSIS — N3010 Interstitial cystitis (chronic) without hematuria: Secondary | ICD-10-CM

## 2021-11-22 DIAGNOSIS — Z87442 Personal history of urinary calculi: Secondary | ICD-10-CM

## 2021-11-22 DIAGNOSIS — R350 Frequency of micturition: Secondary | ICD-10-CM

## 2021-11-22 DIAGNOSIS — N200 Calculus of kidney: Secondary | ICD-10-CM

## 2021-11-22 DIAGNOSIS — N3941 Urge incontinence: Secondary | ICD-10-CM

## 2021-11-22 MED ORDER — IMIPRAMINE HCL 25 MG PO TABS: 25.0000 mg | ORAL_TABLET | Freq: Two times a day (BID) | ORAL | 3 refills | Status: DC

## 2021-11-22 MED ORDER — TOLTERODINE TARTRATE ER 4 MG PO CP24: 4.0000 mg | ORAL_CAPSULE | Freq: Every day | ORAL | 3 refills | Status: DC

## 2021-11-22 NOTE — Progress Notes (Unsigned)
11/22/2021 11:28 AM   Savannah Gomez [birth year] [id number]  Referring provider: Dale Perris, MD [address] [place],  [place] [address]  Chief Complaint  Patient presents with   Nephrolithiasis    1year w/KUB    Urologic history: 1.  Interstitial cystitis - Imipramine 50 mg daily - Tolterodine 4 mg daily   2.  Left nephrolithiasis - 4 mm nonobstructing left lower pole calculus CT 07/2018 - CT 05/2021 showed no urinary tract calculi   HPI: 66 y.o. female presents for annual follow-up  Doing well from a urologic standpoint since her visit last year Stable pelvic symptoms on imipramine No symptom improvement on Myrbetriq and she is back on extended release tolterodine CT performed March 2023 for abdominal pain showed absence of the previously noted left renal calculus Saw [facility] November 2022 for UTI  PMH: Past Medical History:  Diagnosis Date   Allergy    Arthritis    Asthma    Chicken pox    Complication of anesthesia    GERD (gastroesophageal reflux disease)    History of kidney stones    Migraine headache    PONV (postoperative nausea and vomiting)    Urine incontinence     Surgical History: Past Surgical History:  Procedure Laterality Date   BREAST CYST ASPIRATION Bilateral    neg   carpal tunnel sugery     bilateral   CHOLECYSTECTOMY N/A 11/04/2019   Procedure: LAPAROSCOPIC CHOLECYSTECTOMY WITH INTRAOPERATIVE CHOLANGIOGRAM;  Surgeon: Earline Mayotte, MD;  Location: [facility] ORS;  Service: General;  Laterality: N/A;   COLONOSCOPY WITH PROPOFOL N/A 09/20/2018   Procedure: COLONOSCOPY WITH PROPOFOL;  Surgeon: Scot Jun, MD;  Location[facility] ENDOSCOPY;  Service: Endoscopy;  Laterality: N/A;   ELBOW SURGERY  1999   ESOPHAGOGASTRODUODENOSCOPY (EGD) WITH PROPOFOL N/A 09/20/2018   Procedure: ESOPHAGOGASTRODUODENOSCOPY (EGD) WITH PROPOFOL;  Surgeon: Scot Jun, MD;  Location: [facility] ENDOSCOPY;  Service: Endoscopy;   Laterality: N/A;   HAND SURGERY  1999   broken finger - pins   NASAL SINUS SURGERY  1992   ROTATOR CUFF REPAIR  1994   Trigger finger surgery  1999 and 2000   VAGINAL HYSTERECTOMY  1987   secondary to fibroids    Home Medications:  Allergies as of 11/22/2021       Reactions   Demerol [meperidine] Nausea Only        Medication List        Accurate as of November 22, 2021 11:28 AM. If you have any questions, ask your nurse or doctor.          acetaminophen 500 MG tablet Commonly known as: TYLENOL Take 1,000 mg by mouth daily as needed for mild pain or headache.   albuterol 108 (90 Base) MCG/ACT inhaler Commonly known as: VENTOLIN HFA Inhale 2 puffs into the lungs every 6 (six) hours as needed for wheezing or shortness of breath.   busPIRone 5 MG tablet Commonly known as: BUSPAR TAKE 1 TABLET BY MOUTH PRIOR TO MEALS DAILY   famotidine 20 MG tablet Commonly known as: PEPCID TAKE 1 TABLET BY MOUTH TWICE A DAY   fluticasone 110 MCG/ACT inhaler Commonly known as: Flovent HFA INHALE 2 PUFFS BY MOUTH INTO THE LUNGS TWICE A DAY.   imipramine 25 MG tablet Commonly known as: TOFRANIL Take 1 tablet (25 mg total) by mouth 2 (two) times daily.   Motegrity 2 MG Tabs Generic drug: Prucalopride Succinate Take 1 tablet (2 mg total) by mouth  daily.   pantoprazole 40 MG tablet Commonly known as: PROTONIX TAKE 1 TABLET BY MOUTH TWICE A DAY BEFORE A MEAL   sucralfate 1 GM/10ML suspension Commonly known as: CARAFATE Take 10 mLs (1 g total) by mouth 4 (four) times daily.   tolterodine 4 MG 24 hr capsule Commonly known as: DETROL LA Take 1 capsule (4 mg total) by mouth daily.   topiramate 25 MG tablet Commonly known as: TOPAMAX Take 1 tablet (25 mg total) by mouth 2 (two) times daily.        Allergies:  Allergies  Allergen Reactions   Demerol [Meperidine] Nausea Only    Family History: Family History  Problem Relation Age of Onset   Colon cancer Mother         colon   Asthma Mother    Heart disease Father    Hypertension Father    Colon cancer Sister    Stroke Maternal Grandfather    Diabetes Maternal Grandfather    Lung cancer Maternal Grandmother    Breast cancer Maternal Grandmother    Esophageal cancer Neg Hx    Pancreatic cancer Neg Hx    Liver disease Neg Hx    Stomach cancer Neg Hx     Social History:  reports that she has never smoked. She has never used smokeless tobacco. She reports that she does not drink alcohol and does not use drugs.   Physical Exam: BP (!) 147/68   Pulse 86   Ht 5\' 3"  (1.6 m)   Wt 190 lb (86.2 kg)   BMI 33.66 kg/m   Constitutional:  Alert and oriented, No acute distress. HEENT: [place] AT Respiratory: Normal respiratory effort, no increased work of breathing.   Pertinent Imaging: KUB images personally reviewed and interpreted-no calcifications suspicious for urinary tract stones are identified   Assessment & Plan:    1.  Chronic interstitial cystitis Stable, imipramine refilled  2.  Urge incontinence Stable, tolterodine refilled  3.  Nephrolithiasis No suspicious calcifications seen on KUB.  Previously noted left renal calculus no longer present  Continue annual follow-up   , MD  [facility] [address] [place], [place] [phone]

## 2021-12-02 ENCOUNTER — Ambulatory Visit
Admission: RE | Admit: 2021-12-02 | Discharge: 2021-12-02 | Disposition: A | Discharge status: Home / Self Care | Payer: Medicare HMO | Source: Ambulatory Visit | Attending: Internal Medicine | Admitting: Internal Medicine

## 2021-12-02 DIAGNOSIS — Z1231 Encounter for screening mammogram for malignant neoplasm of breast: Secondary | ICD-10-CM | POA: Diagnosis not present

## 2021-12-03 ENCOUNTER — Other Ambulatory Visit: Payer: Self-pay | Admitting: Family

## 2021-12-05 ENCOUNTER — Other Ambulatory Visit: Payer: Self-pay | Admitting: Internal Medicine

## 2021-12-06 ENCOUNTER — Other Ambulatory Visit: Payer: Self-pay

## 2021-12-06 ENCOUNTER — Telehealth: Payer: Self-pay

## 2021-12-06 MED ORDER — BUSPIRONE HCL 5 MG PO TABS: ORAL_TABLET | ORAL | 0 refills | Status: DC

## 2021-12-06 NOTE — Telephone Encounter (Signed)
Dawn called from [employer] to state patient is out of her busPIRone (BUSPAR) 5 MG tablet.  Dawn states refill request was sent electronically to Worthy Rancher, NP.

## 2021-12-06 NOTE — Telephone Encounter (Signed)
Med has been RF 12/06/21

## 2021-12-09 ENCOUNTER — Ambulatory Visit: Payer: Medicare HMO | Admitting: Adult Health

## 2021-12-09 ENCOUNTER — Encounter: Payer: Self-pay | Admitting: Adult Health

## 2021-12-09 DIAGNOSIS — J4530 Mild persistent asthma, uncomplicated: Secondary | ICD-10-CM

## 2021-12-09 DIAGNOSIS — J309 Allergic rhinitis, unspecified: Secondary | ICD-10-CM | POA: Insufficient documentation

## 2021-12-09 NOTE — Assessment & Plan Note (Addendum)
Continue on current regimen.  Appears to be under good control.  Plan  . Patient Instructions  Continue on Flovent 2 puffs Twice daily  , rinse after use Albuterol inhaler As needed   Asthma action plan  Zyrtec 10mg  daily  Flonase daily as needed  Activity as tolerated. Flu shot this fall.  Follow up with Dr. Mortimer Fries in 1 year and As needed

## 2021-12-09 NOTE — Patient Instructions (Signed)
Continue on Flovent 2 puffs Twice daily  , rinse after use Albuterol inhaler As needed   Asthma action plan  Zyrtec 10mg  daily  Flonase daily as needed  Activity as tolerated. Flu shot this fall.  Follow up with Dr. Mortimer Fries in 1 year and As needed

## 2021-12-09 NOTE — Assessment & Plan Note (Signed)
Mild persistent asthma appears to be under good control.  Continue on current regimen trigger prevention.  Plan  Patient Instructions  Continue on Flovent 2 puffs Twice daily  , rinse after use Albuterol inhaler As needed   Asthma action plan  Zyrtec 10mg  daily  Flonase daily as needed  Activity as tolerated. Flu shot this fall.  Follow up with Dr. Mortimer Fries in 1 year and As needed

## 2021-12-09 NOTE — Progress Notes (Signed)
Can double book me  @Patient  ID: Savannah Gomez, female    DOB: 11/13/1955, 66 y.o.   MRN: [id number]  Chief Complaint  Patient presents with   Follow-up    Referring provider: Einar Pheasant, MD  HPI: 66 year old female followed for asthma and allergic rhinitis  TEST/EVENTS :    Pulmonary function test 12/11/2015 FEV1 92% FEV1/FVC 80% RV 106% TLC 102% DLCO corrected and her 42% Impression: Nonobstructive process by spirometry, no significant response to bronchodilators. Mild scooping of end expiratory curve, consistent with a mild obstructive process. Overall fits the clinical diagnosis of asthma.   6 minute walk test total distance 1240 feet/378 m low saturation 99%, highest heart rate 78   12/09/2021 Follow up : Asthma and Allergic rhinitis  Patient presents for a follow-up visit.  Last seen December 2022.  Patient has been doing well since last visit.  Says her asthma seems to be under good control.  Takes Flovent 2 puffs usually once a day.  If she feels that her asthma is not as well controlled she goes back up to twice a day dosing.  She takes Zyrtec every day.  Rarely has to use Flonase.  Feels that her allergies and postnasal drainage are good control.  She is following with GI has multiple GI issues.  She remains on Protonix and Pepcid for GERD symptoms.  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  Says she tries to stay active at home with light walking.  Says her GI issues limit a lot of her activity sometimes.  She denies any increased albuterol use.   Plans on getting the flu shot next month.  Allergies  Allergen Reactions   Demerol [Meperidine] Nausea Only    Immunization History  Administered Date(s) Administered   Fluad Quad(high Dose 65+) 01/17/2020, 01/22/2021   Influenza Split 12/18/2015   Influenza,inj,Quad PF,6+ Mos 01/08/2017, 01/01/2018   Influenza-Unspecified 12/17/2012, 01/05/2014, 01/08/2015, 01/01/2018, 12/23/2018   PFIZER(Purple  Top)SARS-COV-2 Vaccination 07/14/2019, 08/09/2019, 04/19/2020   Pfizer Covid-19 Vaccine Bivalent Booster 32yrs & up 12/17/2020   Pneumococcal Conjugate-13 09/19/2019   Pneumococcal Polysaccharide-23 08/19/2016, 09/23/2021   Zoster Recombinat (Shingrix) 02/09/2019, 05/10/2019    Past Medical History:  Diagnosis Date   Allergy    Arthritis    Asthma    Chicken pox    Complication of anesthesia    GERD (gastroesophageal reflux disease)    History of kidney stones    Migraine headache    PONV (postoperative nausea and vomiting)    Urine incontinence     Tobacco History: Social History   Tobacco Use  Smoking Status Never  Smokeless Tobacco Never   Counseling given: Not Answered   Outpatient Medications Prior to Visit  Medication Sig Dispense Refill   acetaminophen (TYLENOL) 500 MG tablet Take 1,000 mg by mouth daily as needed for mild pain or headache.      albuterol (VENTOLIN HFA) 108 (90 Base) MCG/ACT inhaler Inhale 2 puffs into the lungs every 6 (six) hours as needed for wheezing or shortness of breath. 1 each 10   busPIRone (BUSPAR) 5 MG tablet TAKE 1 TABLET BY MOUTH PRIOR TO MEALS DAILY 90 tablet 0   famotidine (PEPCID) 20 MG tablet TAKE 1 TABLET BY MOUTH TWICE A DAY 60 tablet 3   fluticasone (FLOVENT HFA) 110 MCG/ACT inhaler INHALE 2 PUFFS BY MOUTH INTO THE LUNGS TWICE A DAY. 12 g 10   imipramine (TOFRANIL) 25 MG tablet Take 1 tablet (25 mg total) by mouth 2 (two)  times daily. 180 tablet 3   pantoprazole (PROTONIX) 40 MG tablet TAKE 1 TABLET BY MOUTH TWICE A DAY BEFORE A MEAL 180 tablet 1   Prucalopride Succinate (MOTEGRITY) 2 MG TABS Take 1 tablet (2 mg total) by mouth daily. 90 tablet 2   sucralfate (CARAFATE) 1 GM/10ML suspension Take 10 mLs (1 g total) by mouth 4 (four) times daily. 1200 mL 3   tolterodine (DETROL LA) 4 MG 24 hr capsule Take 1 capsule (4 mg total) by mouth daily. 90 capsule 3   topiramate (TOPAMAX) 25 MG tablet Take 1 tablet (25 mg total) by mouth 2  (two) times daily. 180 tablet 1   No facility-administered medications prior to visit.     Review of Systems:   Constitutional:   No  weight loss, night sweats,  Fevers, chills, fatigue, or  lassitude.  HEENT:   No headaches,  Difficulty swallowing,  Tooth/dental problems, or  Sore throat,                No sneezing, itching, ear ache,  +nasal congestion, post nasal drip,   CV:  No chest pain,  Orthopnea, PND, swelling in lower extremities, anasarca, dizziness, palpitations, syncope.   GI  No heartburn, indigestion, abdominal pain, nausea, vomiting, diarrhea, change in bowel habits, loss of appetite, bloody stools.   Resp: No shortness of breath with exertion or at rest.  No excess mucus, no productive cough,  No non-productive cough,  No coughing up of blood.  No change in color of mucus.  No wheezing.  No chest wall deformity  Skin: no rash or lesions.  GU: no dysuria, change in color of urine, no urgency or frequency.  No flank pain, no hematuria   MS:  No joint pain or swelling.  No decreased range of motion.  No back pain.    Physical Exam  BP 124/76 (BP Location: Left Arm, Cuff Size: Normal)   Pulse 87   Temp 97.8 F (36.6 C) (Temporal)   Ht 5\' 3"  (1.6 m)   Wt 197 lb 6.4 oz (89.5 kg)   SpO2 98%   BMI 34.97 kg/m   GEN: A/Ox3; pleasant , NAD, well nourished    HEENT:  [place]/AT,   NOSE-clear, THROAT-clear, no lesions, no postnasal drip or exudate noted.   NECK:  Supple w/ fair ROM; no JVD; normal carotid impulses w/o bruits; no thyromegaly or nodules palpated; no lymphadenopathy.    RESP  Clear  P & A; w/o, wheezes/ rales/ or rhonchi. no accessory muscle use, no dullness to percussion  CARD:  RRR, no m/r/g, no peripheral edema, pulses intact, no cyanosis or clubbing.  GI:   Soft & nt; nml bowel sounds; no organomegaly or masses detected.   Musco: Warm bil, no deformities or joint swelling noted.   Neuro: alert, no focal deficits noted.    Skin: Warm, no lesions  or rashes      BMET   BNP No results found for: "BNP"  ProBNP No results found for: "PROBNP"  Imaging:       Latest Ref Rng & Units 12/11/2015    2:51 PM  PFT Results  FVC-Pre L 2.81  P  FVC-Predicted Pre % 89  P  FVC-Post L 2.56  P  FVC-Predicted Post % 81  P  Pre FEV1/FVC % % 80  P  Post FEV1/FCV % % 85  P  FEV1-Pre L 2.24  P  FEV1-Predicted Pre % 92  P  FEV1-Post L 2.17  P  DLCO uncorrected ml/min/mmHg 32.61  P  DLCO UNC% % 142  P  DLVA Predicted % 70  P  TLC L 5.02  P  TLC % Predicted % 102  P  RV % Predicted % 106  P    P Preliminary result    No results found for: "NITRICOXIDE"      Assessment & Plan:   Asthma Mild persistent asthma appears to be under good control.  Continue on current regimen trigger prevention.  Plan  Patient Instructions  Continue on Flovent 2 puffs Twice daily  , rinse after use Albuterol inhaler As needed   Asthma action plan  Zyrtec 10mg  daily  Flonase daily as needed  Activity as tolerated. Flu shot this fall.  Follow up with Dr. Mortimer Fries in 1 year and As needed          Allergic rhinitis Continue on current regimen.  Appears to be under good control.  Plan  . Patient Instructions  Continue on Flovent 2 puffs Twice daily  , rinse after use Albuterol inhaler As needed   Asthma action plan  Zyrtec 10mg  daily  Flonase daily as needed  Activity as tolerated. Flu shot this fall.  Follow up with Dr. Mortimer Fries in 1 year and As needed             Rexene Edison, NP 12/09/2021

## 2021-12-18 ENCOUNTER — Other Ambulatory Visit: Payer: Self-pay | Admitting: Gastroenterology

## 2022-01-06 ENCOUNTER — Other Ambulatory Visit: Payer: Self-pay | Admitting: Internal Medicine

## 2022-01-15 NOTE — Progress Notes (Deleted)
Referring Provider: Einar Pheasant, MD Primary Care Physician:  Einar Pheasant, MD  Chief complaint:  Abdominal pain, nausea, constipation   IMPRESSION:  Nausea and abdominal pain     - ? Functional dyspepsia History of mildly elevated abnormal alk phos     - normal alk phos isoenzymes    - other liver enzymes are normal Reflux and H pylori negative gastritis Moderate pancreatic atrophy by MRI Chronic constipation with some improvement on Motegrity Colonic diverticulosis by CT Family history of colon cancer (mother at age 61, sister in her 35s) Cholecystectomy  Nausea and abdominal pain.  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 Reflux and gastritis thought to be contributing in the past. Normal gastric emptying scan. No symptomatic improvement despite PPI BID and H2B, dicyclomine, more aggressively treating constipation, trial of FDGuard and peppermint.  Chronic constipation: Insurance denied Linzess. Miralax did not help. Amitiza caused diarrhea. Continue Motegrity. Use dietary recommendations for additional improvement (see patient instructions)  Family of colon cancer: Last colonoscopy 2020. Repeat colonoscopy in 2025, earlier with new symptoms.   Elevated alkaline phosphatase without extrahepatic biliary dilatation on ultrasound, CT, or MRI.  Isoenzymes normal. Follow-up alk phos normal. Recent testing shows slight increase. Continue close monitoring. Planning liver biopsy if alk phos levels rise.   PLAN: - Continue pantoprazole 40 mg BID and famotidine 20 mg BID and Carafate - Continue Motegrity 2 mg daily - Will use Miralax or magnesium oxide or diet options to improve constipation (see patient instructions) - Continue buspirone 5 mg prior to meals, increase to 10 mg TID if no additional relief over the next 2-4 weeks - Could consider pancreatic enzymes - Food diary to identify food triggers - Office follow-up in 3-4 weeks, earlier if needed  Please see the "Patient  Instructions" section for addition details about the plan.  HPI: Savannah Gomez is a 66 y.o. female who returns in follow-up for evaluation of abdominal pain, nausea, and constipation. She was last seen in the office 07/16/21. The interval history is obtained through the patient and review of her electronic health record.  Initially referred by Dr. Nicki Reaper for an isolated elevated alk phos first identified in 2019. No change in alk phos seen after cholecystectomy 11/04/19 for chronic cholecystitis, cholelithiasis although the magnitude of the elevation has fluctuated over the years. However, most recently the alk phos was normal. Evaluation including labs, abdominal ultrasound, contrast CT, and MRI/MRCP was remarked only for moderate pancreatic atrophy. Pancreatic elastase was normal.  She had an EGD 05/01/2020 to evaluate multiple chronic GI symptoms including regurgitation, nausea, globus and chest pain that radiated to the back despite pantoprazole 40 mg BID for several years for reflux showed reflux esophagitis, gastritis, fundic gland polyps, and retained food.  Duodenal biopsies were normal.  There was no H. pylori.  I recommended that she continue her PPI twice daily and famotidine twice daily.  She was asked to avoid all NSAIDs.  Gastric emptying scan 05/28/2020 was normal  At time of her office visit 11/02/20 her primary complaint was regurgitation, nausea, and epigastric pain - not nearly as bad. Having a bowel movement weekly. GI symptoms may worsen as constipation progresses. Sense of complete evacuation. No improvement with Linzess. Miralax caused loose stools.  Insurance would not cover Linzess. She tried some samples but this did not provide relief.   Alpha gal testing negative 01/22/21  When seen in follow-up 05/20/21 she reported abdominal pain and nausea - especially after meals.  There was also some nocturnal nausea,  although she's not sure if the nausea wakes her from her sleep. Symptoms  are worse than when she was here in 2022 and particularly bad after eating or soon after eating. Pain starts in the upper epigastrium and radiates to the RUQ. No heartburn.  Her fatigue is also worse, and she thinks this may be due to poor sleep related to the nausea.   Trial of Amitiza caused severe abdominal cramping.  She doesn't remember taking FDGuard, which was recommended at the time of her last visit. Continues on pantoprazole 40 mg BID, famotidine 20 mg BID, and Carafate PRN although she is out of Carafate.   CT abd/pelvis with contrast 05/27/21: fatty liver, prior cholecystectomy, small hiatal hernia, colonic diverticulosis  Office follow-up 07/16/21: Trial of dicyclomine and Motegrity may provide some relief. Otherwise, non-prescription treatments as discussed at the time of her last office visit have not provided any meaningful relief.   Returns today in scheduled follow-up after starting Buspar 74m prior to meals 10/03/21. She has been pain free for 4 days this week - which is an improvement.  Despite Motegrity she will have a bowel movement every day to several days. Some progression in pain as the constipation worsens.    Endoscopic history: - EGD for RUQ pain and [facility], no H pylori, small hyperplastic polyp, diverticulosis, small internal hemorrhoids - EGD 05/01/2020 showed reflux, gastritis, fundic gland polyps, and retained food.  Duodenal biopsies were normal.  There was no H. pylori.  I recommended that she continue her PPI twice daily and famotidine twice daily.  She was asked to avoid all NSAIDs.  Labs 09/18/21 showed a normal CBC, normal hepatic function panel except for alk phos of 130  Past Medical History:  Diagnosis Date   Allergy    Arthritis    Asthma    Chicken pox    Complication of anesthesia    GERD (gastroesophageal reflux disease)    History of kidney stones    Migraine headache    PONV  (postoperative nausea and vomiting)    Urine incontinence     Past Surgical History:  Procedure Laterality Date   BREAST CYST ASPIRATION Bilateral    neg   carpal tunnel sugery     bilateral   CHOLECYSTECTOMY N/A 11/04/2019   Procedure: LAPAROSCOPIC CHOLECYSTECTOMY WITH INTRAOPERATIVE CHOLANGIOGRAM;  Surgeon: BRobert Bellow MD;  Location: [facility] ORS;  Service: General;  Laterality: N/A;   COLONOSCOPY WITH PROPOFOL N/A 09/20/2018   Procedure: COLONOSCOPY WITH PROPOFOL;  Surgeon: EManya Silvas MD;  [facility];  Service: Endoscopy;  Laterality: N/A;   ELBOW SURGERY  1999   ESOPHAGOGASTRODUODENOSCOPY (EGD) WITH PROPOFOL N/A 09/20/2018   Procedure: ESOPHAGOGASTRODUODENOSCOPY (EGD) WITH PROPOFOL;  Surgeon: EManya Silvas MD;  [facility];  Service: Endoscopy;  Laterality: N/A;   HAND SURGERY  1999   broken finger - pins   NASAL SINUS SURGERY  1992   ROTATOR CUFF REPAIR  1994   Trigger finger surgery  1999 and 2[place]acey  secondary to fibroids    Current Outpatient Medications  Medication Sig Dispense Refill   acetaminophen (TYLENOL) 500 MG tablet Take 1,000 mg by mouth daily as needed for mild pain or headache.      albuterol (VENTOLIN HFA) 108 (90 Base) MCG/ACT inhaler Inhale 2 puffs into the lungs every 6 (six) hours as needed for wheezing or shortness of breath. 1  each 10   busPIRone (BUSPAR) 5 MG tablet TAKE 1 TABLET BY MOUTH PRIOR TO MEALS DAILY 90 tablet 0   famotidine (PEPCID) 20 MG tablet TAKE 1 TABLET BY MOUTH TWICE A DAY 60 tablet 3   fluticasone (FLOVENT HFA) 110 MCG/ACT inhaler INHALE 2 PUFFS BY MOUTH INTO THE LUNGS TWICE A DAY. 12 g 10   imipramine (TOFRANIL) 25 MG tablet Take 1 tablet (25 mg total) by mouth 2 (two) times daily. 180 tablet 3   pantoprazole (PROTONIX) 40 MG tablet TAKE 1 TABLET BY MOUTH TWICE A DAY BEFORE A MEAL 180 tablet 1   Prucalopride Succinate (MOTEGRITY) 2 MG TABS Take 1 tablet (2 mg total) by  mouth daily. 90 tablet 2   sucralfate (CARAFATE) 1 GM/10ML suspension TAKE 10 ML (1 GRAMS TOTAL) BY MOUTH 4 TIMES DAILY 1200 mL 3   tolterodine (DETROL LA) 4 MG 24 hr capsule Take 1 capsule (4 mg total) by mouth daily. 90 capsule 3   topiramate (TOPAMAX) 25 MG tablet Take 1 tablet (25 mg total) by mouth 2 (two) times daily. 180 tablet 1   No current facility-administered medications for this visit.    Allergies as of 01/16/2022 - Review Complete 12/09/2021  Allergen Reaction Noted   Demerol [meperidine] Nausea Only 01/16/2012     Physical Exam: Weight 05/20/21 189 pounds Weight 08/15/21: 192 pounds General:   Alert,  well-nourished, pleasant and cooperative in NAD Head:  Normocephalic and atraumatic. Eyes:  Sclera clear, no icterus.   Conjunctiva pink. No xanthelasma.  Abdomen:  Soft, I am unable to reproduce her pain but she localizes it to the midline port scar from her cholecystectomy, nondistended, normal bowel sounds, no rebound or guarding. No hepatosplenomegaly.  Neurologic:  Alert and  oriented x4;  grossly nonfocal Skin: No rash or bruise. No spider angioma, palmar erythema, or Terry nails.  Psych:  Alert and cooperative. Normal mood and affect.     Lashea Goda L. Tarri Glenn, MD, MPH 01/15/2022, 10:35 PM

## 2022-01-16 ENCOUNTER — Telehealth: Payer: Self-pay | Admitting: Internal Medicine

## 2022-01-16 ENCOUNTER — Ambulatory Visit: Payer: Medicare HMO | Admitting: Gastroenterology

## 2022-01-16 MED ORDER — AZITHROMYCIN 250 MG PO TABS: ORAL_TABLET | ORAL | 0 refills | Status: AC

## 2022-01-16 NOTE — Telephone Encounter (Signed)
Dr. Mortimer Fries patient. Last seen Tammy 12/09/2021 for asthma.   Called and spoke to patient.  She stated on Sunday she developed headache, vomiting, dry cough, wheezing and increased fatigue.   No recent covid test. She will test and call back with results.  She is using albuterol BID and Flovent BID. No OTC meds.  No supplemental oxygen. She does not monitor oxygen level.   Tammy, please advise. Dr. Mortimer Fries is unavailable.

## 2022-01-16 NOTE — Telephone Encounter (Signed)
Started here that she is not feeling well  Z-Pak No. 1, take as directed-to have on hold if symptoms worsen with discolored mucus. Suspect she has a viral illness.  Would continue with supportive care with fluids  and rest and Tylenol as needed  Albuterol inhaler as needed.   Can continue on Flovent 2 puffs twice daily. Zyrtec 10 mg daily. Mucinex DM twice daily as needed for cough and congestion. If symptoms not improving she will need office visit.  Can set up a virtual or in person visit can double book tomorrow if needed  Please contact office for sooner follow up if symptoms do not improve or worsen or seek emergency care

## 2022-01-16 NOTE — Telephone Encounter (Signed)
Patient is aware of recommendations and voiced her understanding.  Zpak sent to preferred pharmacy.  She will start mucinex and continue albuterol PRN, Zyrtec daily and Flovent BID. She will call back for appt if needed.  Nothing further needed

## 2022-01-16 NOTE — Telephone Encounter (Signed)
Patient states at home COVID test was negative. Please advise

## 2022-01-22 ENCOUNTER — Other Ambulatory Visit: Payer: Medicare HMO

## 2022-01-24 ENCOUNTER — Ambulatory Visit: Payer: Medicare HMO | Admitting: Internal Medicine

## 2022-01-28 ENCOUNTER — Other Ambulatory Visit (INDEPENDENT_AMBULATORY_CARE_PROVIDER_SITE_OTHER): Payer: Medicare HMO

## 2022-01-28 DIAGNOSIS — E7800 Pure hypercholesterolemia, unspecified: Secondary | ICD-10-CM | POA: Diagnosis not present

## 2022-01-28 LAB — LIPID PANEL
Cholesterol: 202 mg/dL — ABNORMAL HIGH (ref 0–200)
HDL: 89.3 mg/dL (ref >39.00)
LDL Cholesterol: 98 mg/dL (ref 0–99)
NonHDL: 112.75
Total CHOL/HDL Ratio: 2
Triglycerides: 73 mg/dL (ref 0.0–149.0)
VLDL: 14.6 mg/dL (ref 0.0–40.0)

## 2022-01-28 LAB — CBC WITH DIFFERENTIAL/PLATELET
Basophils Absolute: 0 10*3/uL (ref 0.0–0.1)
Basophils Relative: 0.8 % (ref 0.0–3.0)
Eosinophils Absolute: 0.3 10*3/uL (ref 0.0–0.7)
Eosinophils Relative: 7.6 % — ABNORMAL HIGH (ref 0.0–5.0)
HCT: 39.5 % (ref 36.0–46.0)
Hemoglobin: 12.9 g/dL (ref 12.0–15.0)
Lymphocytes Relative: 30.9 % (ref 12.0–46.0)
Lymphs Abs: 1.4 10*3/uL (ref 0.7–4.0)
MCHC: 32.7 g/dL (ref 30.0–36.0)
MCV: 83.9 fl (ref 78.0–100.0)
Monocytes Absolute: 0.3 10*3/uL (ref 0.1–1.0)
Monocytes Relative: 6.7 % (ref 3.0–12.0)
Neutro Abs: 2.5 10*3/uL (ref 1.4–7.7)
Neutrophils Relative %: 54 % (ref 43.0–77.0)
Platelets: 279 10*3/uL (ref 150.0–400.0)
RBC: 4.71 Mil/uL (ref 3.87–5.11)
RDW: 14.8 % (ref 11.5–15.5)
WBC: 4.5 10*3/uL (ref 4.0–10.5)

## 2022-01-28 LAB — BASIC METABOLIC PANEL
BUN: 11 mg/dL (ref 6–23)
CO2: 27 mEq/L (ref 19–32)
Calcium: 9.4 mg/dL (ref 8.4–10.5)
Chloride: 105 mEq/L (ref 96–112)
Creatinine, Ser: 0.99 mg/dL (ref 0.40–1.20)
GFR: 58.99 mL/min — ABNORMAL LOW (ref >60.00)
Glucose, Bld: 98 mg/dL (ref 70–99)
Potassium: 3.9 mEq/L (ref 3.5–5.1)
Sodium: 139 mEq/L (ref 135–145)

## 2022-01-28 LAB — HEPATIC FUNCTION PANEL
ALT: 18 U/L (ref 0–35)
AST: 17 U/L (ref 0–37)
Albumin: 4.3 g/dL (ref 3.5–5.2)
Alkaline Phosphatase: 158 U/L — ABNORMAL HIGH (ref 39–117)
Bilirubin, Direct: 0.1 mg/dL (ref 0.0–0.3)
Total Bilirubin: 0.7 mg/dL (ref 0.2–1.2)
Total Protein: 6.5 g/dL (ref 6.0–8.3)

## 2022-01-28 LAB — TSH: TSH: 2.47 u[IU]/mL (ref 0.35–5.50)

## 2022-01-31 ENCOUNTER — Ambulatory Visit (INDEPENDENT_AMBULATORY_CARE_PROVIDER_SITE_OTHER): Payer: Medicare HMO | Admitting: Internal Medicine

## 2022-01-31 ENCOUNTER — Encounter: Payer: Self-pay | Admitting: Internal Medicine

## 2022-01-31 VITALS — BP 132/90 | HR 73 | Temp 98.2°F | Resp 16 | Ht 63.0 in | Wt 196.4 lb

## 2022-01-31 DIAGNOSIS — Z23 Encounter for immunization: Secondary | ICD-10-CM | POA: Diagnosis not present

## 2022-01-31 DIAGNOSIS — E7800 Pure hypercholesterolemia, unspecified: Secondary | ICD-10-CM | POA: Diagnosis not present

## 2022-01-31 DIAGNOSIS — J4530 Mild persistent asthma, uncomplicated: Secondary | ICD-10-CM | POA: Diagnosis not present

## 2022-01-31 DIAGNOSIS — N3010 Interstitial cystitis (chronic) without hematuria: Secondary | ICD-10-CM | POA: Diagnosis not present

## 2022-01-31 DIAGNOSIS — K219 Gastro-esophageal reflux disease without esophagitis: Secondary | ICD-10-CM

## 2022-01-31 DIAGNOSIS — R1084 Generalized abdominal pain: Secondary | ICD-10-CM

## 2022-01-31 DIAGNOSIS — Z800 Family history of malignant neoplasm of digestive organs: Secondary | ICD-10-CM

## 2022-01-31 MED ORDER — TOPIRAMATE 25 MG PO TABS: 25.0000 mg | ORAL_TABLET | Freq: Two times a day (BID) | ORAL | 1 refills | Status: DC

## 2022-01-31 NOTE — Progress Notes (Unsigned)
Patient ID: Savannah Gomez, female   DOB: 1956/01/05, 66 y.o.   MRN: [id number]   Subjective:    Patient ID: Savannah Gomez, female    DOB: 1955-11-06, 66 y.o.   MRN: [id number]    Patient here for  Chief Complaint  Patient presents with   Follow-up   Hyperlipidemia   .   HPI Here to follow up regarding asthma, GERD and hypercholesterolemia.  Recently saw pulmonary.  Stable.  Did have recent infection.  Treated with zpak.  Has been having persistent issues with nausea and abdominal pain.  Seeing Dr Orvan Falconer.  Last evaluated 10/23/21 - recommended continuing protonix and pepcid, motegrity and miralax.  On buspar.     Past Medical History:  Diagnosis Date   Allergy    Arthritis    Asthma    Chicken pox    Complication of anesthesia    GERD (gastroesophageal reflux disease)    History of kidney stones    Migraine headache    PONV (postoperative nausea and vomiting)    Urine incontinence    Past Surgical History:  Procedure Laterality Date   BREAST CYST ASPIRATION Bilateral    neg   carpal tunnel sugery     bilateral   CHOLECYSTECTOMY N/A 11/04/2019   Procedure: LAPAROSCOPIC CHOLECYSTECTOMY WITH INTRAOPERATIVE CHOLANGIOGRAM;  Surgeon: Earline Mayotte, MD;  Location: [facility] ORS;  Service: General;  Laterality: N/A;   COLONOSCOPY WITH PROPOFOL N/A 09/20/2018   Procedure: COLONOSCOPY WITH PROPOFOL;  Surgeon: Scot Jun, MD;  Location: [facility];  Service: Endoscopy;  Laterality: N/A;   ELBOW SURGERY  1999   ESOPHAGOGASTRODUODENOSCOPY (EGD) WITH PROPOFOL N/A 09/20/2018   Procedure: ESOPHAGOGASTRODUODENOSCOPY (EGD) WITH PROPOFOL;  Surgeon: Scot Jun, MD;  Location[facility]COPY;  Service: Endoscopy;  Laterality: N/A;   HAND SURGERY  1999   broken finger - pins   NASAL SINUS SURGERY  1992   ROTATOR CUFF REPAIR  1994   Trigger finger surgery  1999 and 2000   VAGINAL HYSTERECTOMY  1987   secondary to fibroids   Family History  Problem Relation Age of Onset    Colon cancer Mother        colon   Asthma Mother    Heart disease Father    Hypertension Father    Colon cancer Sister    Stroke Maternal Grandfather    Diabetes Maternal Grandfather    Lung cancer Maternal Grandmother    Breast cancer Maternal Grandmother    Esophageal cancer Neg Hx    Pancreatic cancer Neg Hx    Liver disease Neg Hx    Stomach cancer Neg Hx    Social History   Socioeconomic History   Marital status: Married    Spouse name: Not on file   Number of children: Not on file   Years of education: Not on file   Highest education level: Not on file  Occupational History   Not on file  Tobacco Use   Smoking status: Never   Smokeless tobacco: Never  Vaping Use   Vaping Use: Never used  Substance and Sexual Activity   Alcohol use: No   Drug use: No   Sexual activity: Yes    Birth control/protection: None, Post-menopausal  Other Topics Concern   Not on file  Social History Narrative   Not on file   Social Determinants of Health   Financial Resource Strain: Low Risk  (09/16/2021)   Overall Financial Resource Strain (CARDIA)    Difficulty  of Paying Living Expenses: Not hard at all  Food Insecurity: No Food Insecurity (09/16/2021)   Hunger Vital Sign    Worried About Running Out of Food in the Last Year: Never true    Ran Out of Food in the Last Year: Never true  Transportation Needs: No Transportation Needs (09/16/2021)   PRAPARE - Administrator, Civil Service (Medical): No    Lack of Transportation (Non-Medical): No  Physical Activity: Insufficiently Active (09/16/2021)   Exercise Vital Sign    Days of Exercise per Week: 7 days    Minutes of Exercise per Session: 20 min  Stress: No Stress Concern Present (09/16/2021)   [employer] of Occupational Health - Occupational Stress Questionnaire    Feeling of Stress : Not at all  Social Connections: Unknown (09/16/2021)   Social Connection and Isolation Panel [NHANES]    Frequency of  Communication with Friends and Family: More than three times a week    Frequency of Social Gatherings with Friends and Family: More than three times a week    Attends Religious Services: Not on Marketing executive or Organizations: Not on file    Attends Banker Meetings: Not on file    Marital Status: Not on file     Review of Systems     Objective:     BP (!) 132/90 (BP Location: Left Arm, Patient Position: Sitting, Cuff Size: Small)   Pulse 73   Temp 98.2 F (36.8 C) (Temporal)   Resp 16   Ht 5\' 3"  (1.6 m)   Wt 196 lb 6.4 oz (89.1 kg)   SpO2 99%   BMI 34.79 kg/m  Wt Readings from Last 3 Encounters:  01/31/22 196 lb 6.4 oz (89.1 kg)  12/09/21 197 lb 6.4 oz (89.5 kg)  11/22/21 190 lb (86.2 kg)    Physical Exam   Outpatient Encounter Medications as of 01/31/2022  Medication Sig   acetaminophen (TYLENOL) 500 MG tablet Take 1,000 mg by mouth daily as needed for mild pain or headache.    albuterol (VENTOLIN HFA) 108 (90 Base) MCG/ACT inhaler Inhale 2 puffs into the lungs every 6 (six) hours as needed for wheezing or shortness of breath.   busPIRone (BUSPAR) 5 MG tablet TAKE 1 TABLET BY MOUTH PRIOR TO MEALS DAILY   famotidine (PEPCID) 20 MG tablet TAKE 1 TABLET BY MOUTH TWICE A DAY   fluticasone (FLOVENT HFA) 110 MCG/ACT inhaler INHALE 2 PUFFS BY MOUTH INTO THE LUNGS TWICE A DAY.   imipramine (TOFRANIL) 25 MG tablet Take 1 tablet (25 mg total) by mouth 2 (two) times daily.   pantoprazole (PROTONIX) 40 MG tablet TAKE 1 TABLET BY MOUTH TWICE A DAY BEFORE A MEAL   Prucalopride Succinate (MOTEGRITY) 2 MG TABS Take 1 tablet (2 mg total) by mouth daily.   sucralfate (CARAFATE) 1 GM/10ML suspension TAKE 10 ML (1 GRAMS TOTAL) BY MOUTH 4 TIMES DAILY   tolterodine (DETROL LA) 4 MG 24 hr capsule Take 1 capsule (4 mg total) by mouth daily.   topiramate (TOPAMAX) 25 MG tablet Take 1 tablet (25 mg total) by mouth 2 (two) times daily.   No facility-administered  encounter medications on file as of 01/31/2022.     Lab Results  Component Value Date   WBC 4.5 01/28/2022   HGB 12.9 01/28/2022   HCT 39.5 01/28/2022   PLT 279.0 01/28/2022   GLUCOSE 98 01/28/2022   CHOL 202 (H) 01/28/2022  TRIG 73.0 01/28/2022   HDL 89.30 01/28/2022   LDLCALC 98 01/28/2022   ALT 18 01/28/2022   AST 17 01/28/2022   NA 139 01/28/2022   K 3.9 01/28/2022   CL 105 01/28/2022   CREATININE 0.99 01/28/2022   BUN 11 01/28/2022   CO2 27 01/28/2022   TSH 2.47 01/28/2022   HGBA1C 5.4 10/03/2019    MM 3D SCREEN BREAST BILATERAL  Result Date: 12/03/2021 CLINICAL DATA:  Screening. EXAM: DIGITAL SCREENING BILATERAL MAMMOGRAM WITH TOMOSYNTHESIS AND CAD TECHNIQUE: Bilateral screening digital craniocaudal and mediolateral oblique mammograms were obtained. Bilateral screening digital breast tomosynthesis was performed. The images were evaluated with computer-aided detection. COMPARISON:  Previous exam(s). ACR Breast Density Category b: There are scattered areas of fibroglandular density. FINDINGS: There are no findings suspicious for malignancy. IMPRESSION: No mammographic evidence of malignancy. A result letter of this screening mammogram will be mailed directly to the patient. RECOMMENDATION: Screening mammogram in one year. (Code:SM-B-01Y) BI-RADS CATEGORY  1: Negative. Electronically Signed   By: Harmon Pier M.D.   On: 12/03/2021 12:54       Assessment & Plan:   Problem List Items Addressed This Visit     Hypercholesteremia - Primary    The 10-year ASCVD risk score (Arnett DK, et al., 2019) is: 5.6%   Values used to calculate the score:     Age: 62 years     Sex: Female     Is Non-Hispanic African American: No     Diabetic: No     Tobacco smoker: No     Systolic Blood Pressure: 124 mmHg     Is BP treated: No     HDL Cholesterol: 89.3 mg/dL     Total Cholesterol: 202 mg/dL       Other Visit Diagnoses     Need for influenza vaccination            Dale Eden, MD

## 2022-01-31 NOTE — Assessment & Plan Note (Signed)
The 10-year ASCVD risk score (Arnett DK, et al., 2019) is: 5.6%   Values used to calculate the score:     Age: 66 years     Sex: Female     Is Non-Hispanic African American: No     Diabetic: No     Tobacco smoker: No     Systolic Blood Pressure: 124 mmHg     Is BP treated: No     HDL Cholesterol: 89.3 mg/dL     Total Cholesterol: 202 mg/dL  Low cholesterol diet and exercise.  Follow lipid panel.

## 2022-02-01 ENCOUNTER — Encounter: Payer: Self-pay | Admitting: Internal Medicine

## 2022-02-01 ENCOUNTER — Telehealth: Payer: Self-pay | Admitting: Internal Medicine

## 2022-02-01 NOTE — Assessment & Plan Note (Signed)
Has been evaluated and followed by urology. Imipramine.  

## 2022-02-01 NOTE — Assessment & Plan Note (Signed)
Persistent intermittent abdominal pain and GI/bowel issues as outlined.  Continues on PPI/H2 blocker, motegrity and miralax.  May have some improvement.  Bowels are moving more.  Has f/u with Dr Orvan Falconer in 02/2022.  D/w her regarding further w/up.

## 2022-02-01 NOTE — Telephone Encounter (Signed)
Message sent for update  

## 2022-02-01 NOTE — Addendum Note (Signed)
Addended by: Charm Barges on: 02/01/2022 02:34 PM   Modules accepted: Level of Service

## 2022-02-01 NOTE — Assessment & Plan Note (Signed)
Reviewed.  Colonoscopy 2020. Due 2025.   

## 2022-02-01 NOTE — Assessment & Plan Note (Signed)
Breathing appears to be stable.  Has rescue inhaler if needed.  Just evaluated 11/2021 - pulmonary.  Continue flovent.  Stable.  

## 2022-02-01 NOTE — Assessment & Plan Note (Signed)
EGD February 2022-gastritis.  Continue proton pump inhibitor and Pepcid.  Also on motegrity and miralax. Continues to follow-up with GI (Dr. Orvan Falconer). W/up as outlined.  Normal gastric emptying study. Has f/u 02/2022.

## 2022-02-05 ENCOUNTER — Other Ambulatory Visit: Payer: Self-pay | Admitting: Internal Medicine

## 2022-02-20 ENCOUNTER — Other Ambulatory Visit: Payer: Self-pay | Admitting: Gastroenterology

## 2022-03-05 ENCOUNTER — Ambulatory Visit: Payer: Medicare HMO | Admitting: Gastroenterology

## 2022-03-05 ENCOUNTER — Encounter: Payer: Self-pay | Admitting: Gastroenterology

## 2022-03-05 VITALS — BP 140/82 | HR 75 | Ht 63.0 in | Wt 192.0 lb

## 2022-03-05 DIAGNOSIS — K625 Hemorrhage of anus and rectum: Secondary | ICD-10-CM | POA: Diagnosis not present

## 2022-03-05 DIAGNOSIS — R109 Unspecified abdominal pain: Secondary | ICD-10-CM

## 2022-03-05 MED ORDER — BUSPIRONE HCL 5 MG PO TABS: 5.0000 mg | ORAL_TABLET | Freq: Three times a day (TID) | ORAL | 3 refills | Status: DC

## 2022-03-05 MED ORDER — HYDROCORTISONE ACETATE 25 MG RE SUPP: 25.0000 mg | Freq: Two times a day (BID) | RECTAL | 1 refills | Status: DC

## 2022-03-05 NOTE — Progress Notes (Addendum)
Referring Provider: Einar Pheasant, MD Primary Care Physician:  Einar Pheasant, MD  Chief complaint:  Rectal bleeding, abdominal pain   IMPRESSION:  Painless rectal bleeding with history of internal hemorrhoids on prior colonoscopy Nausea and abdominal pain     - gastric emptying scan and CT scan were normal    - ? 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 Internal hemorrhoids on exam and on last colonoscopy with Dr. Vira Agar.   Nausea and abdominal pain. Improving on Buspar.  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 Reflux and gastritis thought to be contributing in the past given prior EGD results. Normal gastric emptying scan and CT scan. Had minimal improvement in the past with PPI BID and H2B, dicyclomine, more aggressively treating constipation, trial of FDGuard and peppermint.  Chronic constipation: Insurance denied Linzess. Miralax did not help. Amitiza caused diarrhea. Motegrity was not effective enough for the out-of-pocket expense. Continue  dietary recommendations. Could consider Trulance.   Family of colon cancer: Last colonoscopy 2020. Repeat colonoscopy in 2025, earlier with new symptoms.   Elevated alkaline phosphatase without extrahepatic biliary dilatation on ultrasound, CT, or MRI.  Isoenzymes normal. Follow-up alk phos normal. Recent testing shows slight increase.  Continue close monitoring. Planning liver biopsy if alk phos levels rise.   PLAN: - Continue pantoprazole 40 mg BID and famotidine 20 mg BID and Carafate PRN - Will use Miralax or magnesium oxide or diet options to improve constipation (see patient instructions), declined trial of additional prescription therapies - Continue buspirone 5-10 mg prior to meals, will try 10 mg TID to see if this further improves her symptoms - Colonoscopy 2025, earlier if any additional rectal bleeding - Office follow-up in 3-4 months, earlier if needed  Please see the "Patient Instructions" section for addition details about the plan.  HPI: Savannah Gomez is a 66 y.o. female who returns in follow-up for evaluation of abdominal pain, constipation, and recent rectal bleeding. She was last seen in the office 10/23/21. The interval history is obtained through the patient and review of her electronic health record.  Initially referred by Dr. Nicki Reaper for an isolated elevated alk phos first identified in 2019. No change in alk phos seen after cholecystectomy 11/04/19 for chronic cholecystitis, cholelithiasis although the magnitude of the elevation has fluctuated over the years. However, most recently the alk phos was normal. Evaluation including labs, abdominal ultrasound, contrast CT, and MRI/MRCP was remarked only for moderate pancreatic atrophy. Pancreatic elastase was normal.  She had an EGD 05/01/2020 to evaluate multiple chronic GI symptoms including regurgitation, nausea, globus and chest pain that radiated to the back despite pantoprazole 40 mg BID for several years for reflux showed reflux esophagitis, gastritis, fundic gland polyps, and retained food.  Duodenal biopsies were normal.  There was no H. pylori.  I recommended that she continue her PPI twice daily  and famotidine twice daily.  She was asked to avoid all NSAIDs.  Gastric emptying scan 05/28/2020 was normal  At time of her office visit 11/02/20 her primary  complaint was regurgitation, nausea, and epigastric pain - not nearly as bad. Having a bowel movement weekly. GI symptoms may worsen as constipation progresses. Sense of complete evacuation. No improvement with Linzess. Miralax caused loose stools.  Insurance would not cover Linzess. She tried some samples but this did not provide relief.   Alpha gal testing negative 01/22/21  When seen in follow-up 05/20/21 she reported abdominal pain and nausea - especially after meals.  There was also some nocturnal nausea, although she's not sure if the nausea wakes her from her sleep. Symptoms are worse than when she was here in 2022 and particularly bad after eating or soon after eating. Pain starts in the upper epigastrium and radiates to the RUQ. No heartburn.  Her fatigue is also worse, and she thinks this may be due to poor sleep related to the nausea.   Trial of Amitiza caused severe abdominal cramping.   CT abd/pelvis with contrast 05/27/21: fatty liver, prior cholecystectomy, small hiatal hernia, colonic diverticulosis  Office follow-up 07/16/21: Trial of dicyclomine and Motegrity may provide some relief. Otherwise, non-prescription treatments as discussed at the time of her last office visit have not provided any meaningful relief.   Office visit 10/23/21: Returned after starting Buspar 45m prior to meals 10/03/21. She has been pain free for 4 days this week - which is an improvement.  Despite Motegrity she will have a bowel movement every day to several days. Some progression in pain as the constipation worsens.   Abdominal x-ray 11/20/21 to evaluate for kidney stones: Extensive stool throughout the colon, no kidney stones  Returns today in follow-up. Overall she may be better. However, she has continued to have some abdominal pain. Had some painless rectal bleeding both with and without bowel movement last wek. She didn't think the Motegrity was worth $100/month. She is having a bowel movement 1-2 times a week  without it. She thinks the improvement is related to using buspirone 5 mg prior to meals. Using Senekot PRN but only needed a couple of times since she was last here.    Endoscopic history: - EGD for RUQ pain and Colonoscopy 08/2018 with Dr. EVira [facility] hiatal hernia, no H pylori, small hyperplastic polyp, diverticulosis, small internal hemorrhoids - EGD 05/01/2020 showed reflux, gastritis, fundic gland polyps, and retained food.  Duodenal biopsies were normal.  There was no H. pylori.  I recommended that she continue her PPI twice daily and famotidine twice daily.  She was asked to avoid all NSAIDs.  Labs 09/18/21 showed a normal CBC, normal hepatic function panel except for alk phos of 130  Past Medical History:  Diagnosis Date   Allergy    Arthritis    Asthma    Chicken pox    Complication of anesthesia    GERD (gastroesophageal reflux disease)    History of kidney stones    Migraine headache    PONV (postoperative nausea and vomiting)    Urine incontinence     Past Surgical History:  Procedure Laterality Date   BREAST CYST ASPIRATION Bilateral    neg   carpal tunnel sugery     bilateral   CHOLECYSTECTOMY N/A 11/04/2019   Procedure: LAPAROSCOPIC CHOLECYSTECTOMY WITH INTRAOPERATIVE CHOLANGIOGRAM;  Surgeon: BRobert Bellow MD;  Location: [facility] ORS;  Service: General;  Laterality: N/A;  COLONOSCOPY WITH PROPOFOL N/A 09/20/2018   Procedure: COLONOSCOPY WITH PROPOFOL;  Surgeon: Manya Silvas, MD;  Location: [facility] ENDOSCOPY;  Service: Endoscopy;  Laterality: N/A;   ELBOW SURGERY  1999   ESOPHAGOGASTRODUODENOSCOPY (EGD) WITH PROPOFOL N/A 09/20/2018   Procedure: ESOPHAGOGASTRODUODENOSCOPY (EGD) WITH PROPOFOL;  Surgeon: Manya Silvas, MD;  Location: [facility] ENDOSCOPY;  Service: Endoscopy;  Laterality: N/A;   HAND SURGERY  1999   broken finger - pins   NASAL SINUS SURGERY  1992   ROTATOR CUFF REPAIR  1994   Trigger finger surgery  1999 and [place]   secondary to fibroids    Current Outpatient Medications  Medication Sig Dispense Refill   acetaminophen (TYLENOL) 500 MG tablet Take 1,000 mg by mouth daily as needed for mild pain or headache.      albuterol (VENTOLIN HFA) 108 (90 Base) MCG/ACT inhaler Inhale 2 puffs into the lungs every 6 (six) hours as needed for wheezing or shortness of breath. 1 each 10   busPIRone (BUSPAR) 5 MG tablet TAKE 1 TABLET BY MOUTH PRIOR TO MEALS DAILY 90 tablet 0   famotidine (PEPCID) 20 MG tablet TAKE 1 TABLET BY MOUTH TWICE A DAY 60 tablet 11   fluticasone (FLOVENT HFA) 110 MCG/ACT inhaler INHALE 2 PUFFS BY MOUTH INTO THE LUNGS TWICE A DAY. 12 g 10   imipramine (TOFRANIL) 25 MG tablet Take 1 tablet (25 mg total) by mouth 2 (two) times daily. 180 tablet 3   pantoprazole (PROTONIX) 40 MG tablet TAKE 1 TABLET BY MOUTH TWICE A DAY BEFORE A MEAL 180 tablet 1   Prucalopride Succinate (MOTEGRITY) 2 MG TABS Take 1 tablet (2 mg total) by mouth daily. 90 tablet 2   sucralfate (CARAFATE) 1 GM/10ML suspension TAKE 10 ML (1 GRAMS TOTAL) BY MOUTH 4 TIMES DAILY 1200 mL 3   tolterodine (DETROL LA) 4 MG 24 hr capsule Take 1 capsule (4 mg total) by mouth daily. 90 capsule 3   topiramate (TOPAMAX) 25 MG tablet Take 1 tablet (25 mg total) by mouth 2 (two) times daily. 180 tablet 1   No current facility-administered medications for this visit.    Allergies as of 03/05/2022 - Review Complete 02/01/2022  Allergen Reaction Noted   Demerol [meperidine] Nausea Only 01/16/2012     Physical Exam: Weight 05/20/21 189 pounds Weight 08/15/21: 192 pounds General:   Alert,  well-nourished, pleasant and cooperative in NAD Head:  Normocephalic and atraumatic. Eyes:  Sclera clear, no icterus.   Conjunctiva pink. No xanthelasma.  Abdomen:  Soft, I am unable to reproduce her pain but she localizes it to the midline port scar from her cholecystectomy, nondistended, normal bowel sounds, no rebound or guarding. No  hepatosplenomegaly.  Rectal:  No chemical dermatitis. No external hemorrhoids, fissure or fistula. No prolapsing hemorrhoids. Palpable internal hemorrhoids. No rectal prolapse. Normal anocutaneous reflex. No stool in the rectal vault. No mass or fecal impaction. Normal anal resting tone.  Chaperone: Heather Neurologic:  Alert and  oriented x4;  grossly nonfocal Skin: No rash or bruise. No spider angioma, palmar erythema, or Terry nails.  Psych:  Alert and cooperative. Normal mood and affect.     Hannan Hutmacher L. Tarri Glenn, MD, MPH 03/05/2022, 8:59 AM

## 2022-03-05 NOTE — Patient Instructions (Signed)
It was a pleasure to see you today.  I think your recent bleeding is likely due to internal hemorrhoids given your colonoscopy results with Dr. Mechele Collin in 2020.  We discussed using suppositories for local treatment.  I would continue to work to avoid constipation and straining as much as possible.  If you have additional bleeding despite the suppositories please let me know as we would want to proceed urgently with another colonoscopy.  It sounds like the BuSpar is helping.  Your current prescription is for 5 mg.  You could try taking 2 of these 3 times a day to see if that offers any additional benefit.  Otherwise, I would continue the 5 mg dosing taken prior to every meal.  We did not change any of your medications today.  I would like to see you back in the office in 3 to 4 months.  Please let me know if you need anything prior to that time.  In particular please call or send a MyChart message with any bleeding.

## 2022-03-05 NOTE — Progress Notes (Signed)
Rectal exam

## 2022-04-14 ENCOUNTER — Other Ambulatory Visit: Payer: Self-pay | Admitting: Internal Medicine

## 2022-04-14 DIAGNOSIS — H40013 Open angle with borderline findings, low risk, bilateral: Secondary | ICD-10-CM | POA: Diagnosis not present

## 2022-04-14 DIAGNOSIS — H04123 Dry eye syndrome of bilateral lacrimal glands: Secondary | ICD-10-CM | POA: Diagnosis not present

## 2022-04-14 DIAGNOSIS — J4520 Mild intermittent asthma, uncomplicated: Secondary | ICD-10-CM

## 2022-05-13 ENCOUNTER — Telehealth: Payer: Self-pay | Admitting: Gastroenterology

## 2022-05-13 NOTE — Telephone Encounter (Signed)
Pt states she is taking carafate, pepcid, pantoprazole, and buspirone as prescribed and still has abdominal pain. Pt also reports she feels nauseated, bloated and tired. Symptoms have continued since December. Pt is taking 1 tablet of buspirone 3 times daily. Pt also reports that 1 week ago she developed red streaks on her abdomen. Pt states she does not have broken skin anywhere. Pt denies recently gaining weight and states she does not think the streaks are stretch marks. Streaks start under breasts and go down to pubic bone. Pt reports she has felt tired recently. Pt has not taken temp but states she occasionally feels like she has chills.

## 2022-05-13 NOTE — Telephone Encounter (Signed)
Inbound call from patient stating that she is having abdominal pain and nausea everyday. Patient stated that she has devolved red streaks on her abdominal area. Patient is requesting a call back to discuss. Please advise.

## 2022-05-14 NOTE — Telephone Encounter (Signed)
Left message on machine to call back  

## 2022-05-14 NOTE — Telephone Encounter (Signed)
Patient returned call

## 2022-05-15 NOTE — Telephone Encounter (Signed)
The referral has been made to [facility] and all records faxed.  SIBO kit at the front desk on 2nd floor.

## 2022-05-15 NOTE — Telephone Encounter (Signed)
I have spoken to the pt and made her aware of the recommendations per Dr Tarri Glenn.  She would like to complete the SIBO and referral to [facility].  She will being FDgard as directed.  I will leave the SIBO kit at the front desk on the 2nd floor for pick up.

## 2022-05-21 ENCOUNTER — Other Ambulatory Visit: Payer: Self-pay | Admitting: *Deleted

## 2022-05-21 MED ORDER — SUCRALFATE 1 GM/10ML PO SUSP: 1.0000 g | Freq: Three times a day (TID) | ORAL | 2 refills | Status: DC

## 2022-05-26 NOTE — Telephone Encounter (Signed)
Received fax from [facility] and they are not accepting second opinion referrals currently. Please advise .

## 2022-05-29 ENCOUNTER — Other Ambulatory Visit (INDEPENDENT_AMBULATORY_CARE_PROVIDER_SITE_OTHER): Payer: Medicare HMO

## 2022-05-29 DIAGNOSIS — E7800 Pure hypercholesterolemia, unspecified: Secondary | ICD-10-CM

## 2022-05-29 LAB — HEPATIC FUNCTION PANEL
ALT: 17 U/L (ref 0–35)
AST: 17 U/L (ref 0–37)
Albumin: 4 g/dL (ref 3.5–5.2)
Alkaline Phosphatase: 137 U/L — ABNORMAL HIGH (ref 39–117)
Bilirubin, Direct: 0.1 mg/dL (ref 0.0–0.3)
Total Bilirubin: 0.5 mg/dL (ref 0.2–1.2)
Total Protein: 6.5 g/dL (ref 6.0–8.3)

## 2022-05-29 LAB — BASIC METABOLIC PANEL
BUN: 15 mg/dL (ref 6–23)
CO2: 27 mEq/L (ref 19–32)
Calcium: 9.4 mg/dL (ref 8.4–10.5)
Chloride: 104 mEq/L (ref 96–112)
Creatinine, Ser: 1.04 mg/dL (ref 0.40–1.20)
GFR: 55.48 mL/min — ABNORMAL LOW (ref >60.00)
Glucose, Bld: 104 mg/dL — ABNORMAL HIGH (ref 70–99)
Potassium: 3.8 mEq/L (ref 3.5–5.1)
Sodium: 139 mEq/L (ref 135–145)

## 2022-05-29 LAB — LIPID PANEL
Cholesterol: 189 mg/dL (ref 0–200)
HDL: 74.4 mg/dL (ref >39.00)
LDL Cholesterol: 95 mg/dL (ref 0–99)
NonHDL: 114.34
Total CHOL/HDL Ratio: 3
Triglycerides: 98 mg/dL (ref 0.0–149.0)
VLDL: 19.6 mg/dL (ref 0.0–40.0)

## 2022-05-29 NOTE — Telephone Encounter (Signed)
Referral faxed.  

## 2022-06-02 ENCOUNTER — Ambulatory Visit (INDEPENDENT_AMBULATORY_CARE_PROVIDER_SITE_OTHER): Payer: Medicare HMO | Admitting: Internal Medicine

## 2022-06-02 ENCOUNTER — Encounter: Payer: Self-pay | Admitting: Internal Medicine

## 2022-06-02 VITALS — BP 126/72 | HR 86 | Temp 98.0°F | Resp 16 | Ht 63.0 in | Wt 192.0 lb

## 2022-06-02 DIAGNOSIS — J4530 Mild persistent asthma, uncomplicated: Secondary | ICD-10-CM | POA: Diagnosis not present

## 2022-06-02 DIAGNOSIS — K219 Gastro-esophageal reflux disease without esophagitis: Secondary | ICD-10-CM | POA: Diagnosis not present

## 2022-06-02 DIAGNOSIS — E7800 Pure hypercholesterolemia, unspecified: Secondary | ICD-10-CM

## 2022-06-02 DIAGNOSIS — R5383 Other fatigue: Secondary | ICD-10-CM | POA: Diagnosis not present

## 2022-06-02 DIAGNOSIS — N3010 Interstitial cystitis (chronic) without hematuria: Secondary | ICD-10-CM

## 2022-06-02 DIAGNOSIS — Z800 Family history of malignant neoplasm of digestive organs: Secondary | ICD-10-CM | POA: Diagnosis not present

## 2022-06-02 DIAGNOSIS — Z8349 Family history of other endocrine, nutritional and metabolic diseases: Secondary | ICD-10-CM | POA: Insufficient documentation

## 2022-06-02 DIAGNOSIS — R1084 Generalized abdominal pain: Secondary | ICD-10-CM | POA: Diagnosis not present

## 2022-06-02 LAB — CBC WITH DIFFERENTIAL/PLATELET
Basophils Absolute: 0 10*3/uL (ref 0.0–0.1)
Basophils Relative: 0.6 % (ref 0.0–3.0)
Eosinophils Absolute: 0.3 10*3/uL (ref 0.0–0.7)
Eosinophils Relative: 7.8 % — ABNORMAL HIGH (ref 0.0–5.0)
HCT: 40.3 % (ref 36.0–46.0)
Hemoglobin: 13.4 g/dL (ref 12.0–15.0)
Lymphocytes Relative: 29.3 % (ref 12.0–46.0)
Lymphs Abs: 1.2 10*3/uL (ref 0.7–4.0)
MCHC: 33.4 g/dL (ref 30.0–36.0)
MCV: 83.3 fl (ref 78.0–100.0)
Monocytes Absolute: 0.3 10*3/uL (ref 0.1–1.0)
Monocytes Relative: 7.1 % (ref 3.0–12.0)
Neutro Abs: 2.4 10*3/uL (ref 1.4–7.7)
Neutrophils Relative %: 55.2 % (ref 43.0–77.0)
Platelets: 238 10*3/uL (ref 150.0–400.0)
RBC: 4.83 Mil/uL (ref 3.87–5.11)
RDW: 14.8 % (ref 11.5–15.5)
WBC: 4.3 10*3/uL (ref 4.0–10.5)

## 2022-06-02 LAB — IBC + FERRITIN
Ferritin: 9.2 ng/mL — ABNORMAL LOW (ref 10.0–291.0)
Iron: 72 ug/dL (ref 42–145)
Saturation Ratios: 19.2 % — ABNORMAL LOW (ref 20.0–50.0)
TIBC: 375.2 ug/dL (ref 250.0–450.0)
Transferrin: 268 mg/dL (ref 212.0–360.0)

## 2022-06-02 MED ORDER — PANTOPRAZOLE SODIUM 40 MG PO TBEC: 40.0000 mg | DELAYED_RELEASE_TABLET | Freq: Two times a day (BID) | ORAL | 3 refills | Status: DC

## 2022-06-02 MED ORDER — TOPIRAMATE 25 MG PO TABS: 25.0000 mg | ORAL_TABLET | Freq: Two times a day (BID) | ORAL | 1 refills | Status: DC

## 2022-06-02 NOTE — Assessment & Plan Note (Signed)
Has been seeing Dr Tarri Glenn. She has been on protonix, carafate, pepcid and buspar for her stomach. Still with persistent symptoms.  Recently recommended for her to take FDGard 30-60 minutes before meals.  May have helped some.  Still with intermittent pain and nausea.  Persistent.  Being referred to [facility] - [facility].

## 2022-06-02 NOTE — Assessment & Plan Note (Signed)
The 10-year ASCVD risk score (Arnett DK, et al., 2019) is: 5.9%   Values used to calculate the score:     Age: 68 years     Sex: Female     Is Non-Hispanic African American: No     Diabetic: No     Tobacco smoker: No     Systolic Blood Pressure: 123XX123 mmHg     Is BP treated: No     HDL Cholesterol: 74.4 mg/dL     Total Cholesterol: 189 mg/dL  Low cholesterol diet and exercise.  Follow lipid panel.

## 2022-06-02 NOTE — Assessment & Plan Note (Addendum)
Increased fatigue.  Describes the persistent nausea and has been dealing with these GI symptoms for a long time.  W/up unrevealing.  Continue current medication regimen with the addition of FDgard.  Being referred to [facility].  Follow.  Check labs as outlined, including am cortisol.  Unclear etiology pf skin changes.

## 2022-06-02 NOTE — Assessment & Plan Note (Signed)
Reviewed.  Colonoscopy 2020. Due 2025.

## 2022-06-02 NOTE — Progress Notes (Unsigned)
Subjective:    Patient ID: Savannah Gomez, female    DOB: 08-30-1955, 67 y.o.   MRN: [id number]  Patient here for  Chief Complaint  Patient presents with   Medical Management of Chronic Issues    HPI Here to follow up regarding asthma, GERD and hypercholesterolemia.  Has been having persistent issues with nausea and abdominal pain. Has been seeing Dr Tarri Glenn. She has been on protonix, carafate, pepcid and buspar for her stomach. Still with persistent symptoms.  Recently recommended for her to take FDGard 30-60 minutes before meals.  May have helped some.  Still with intermittent pain and nausea.  Persistent.  Being referred to GI - [facility].  Mother has hemochromatosis.  She raised question of checking iron.  No chest pain or sob reported.  No cough or congestion.  Has noticed red streaking on her abdomen.  No itching.    Past Medical History:  Diagnosis Date   Allergy    Arthritis    Asthma    Chicken pox    Complication of anesthesia    GERD (gastroesophageal reflux disease)    History of kidney stones    Migraine headache    PONV (postoperative nausea and vomiting)    Urine incontinence    Past Surgical History:  Procedure Laterality Date   BREAST CYST ASPIRATION Bilateral    neg   carpal tunnel sugery     bilateral   CHOLECYSTECTOMY N/A 11/04/2019   Procedure: LAPAROSCOPIC CHOLECYSTECTOMY WITH INTRAOPERATIVE CHOLANGIOGRAM;  Surgeon: Robert Bellow, MD;  Location: [facility] ORS;  Service: General;  Laterality: N/A;   COLONOSCOPY WITH PROPOFOL N/A 09/20/2018   Procedure: COLONOSCOPY WITH PROPOFOL;  Surgeon: Manya Silvas, MD;  Location: [facility] ENDOSCOPY;  Service: Endoscopy;  Laterality: N/A;   ELBOW SURGERY  1999   ESOPHAGOGASTRODUODENOSCOPY (EGD) WITH PROPOFOL N/A 09/20/2018   Procedure: ESOPHAGOGASTRODUODENOSCOPY (EGD) WITH PROPOFOL;  Surgeon: Manya Silvas, MD;  Location: [facility] ENDOSCOPY;  Service: Endoscopy;  Laterality: N/A;   HAND SURGERY  1999   broken finger -  pins   NASAL SINUS SURGERY  1992   ROTATOR CUFF REPAIR  1994   Trigger finger surgery  1999 and 2000   VAGINAL HYSTERECTOMY  1987   secondary to fibroids   Family History  Problem Relation Age of Onset   Colon cancer Mother        colon   Asthma Mother    Heart disease Father    Hypertension Father    Colon cancer Sister    Stroke Maternal Grandfather    Diabetes Maternal Grandfather    Lung cancer Maternal Grandmother    Breast cancer Maternal Grandmother    Esophageal cancer Neg Hx    Pancreatic cancer Neg Hx    Liver disease Neg Hx    Stomach cancer Neg Hx    Social History   Socioeconomic History   Marital status: Married    Spouse name: Not on file   Number of children: Not on file   Years of education: Not on file   Highest education level: Not on file  Occupational History   Not on file  Tobacco Use   Smoking status: Never   Smokeless tobacco: Never  Vaping Use   Vaping Use: Never used  Substance and Sexual Activity   Alcohol use: No   Drug use: No   Sexual activity: Yes    Birth control/protection: None, Post-menopausal  Other Topics Concern   Not on file  Social History  Narrative   Not on file   Social Determinants of Health   Financial Resource Strain: Low Risk  (09/16/2021)   Overall Financial Resource Strain (CARDIA)    Difficulty of Paying Living Expenses: Not hard at all  Food Insecurity: No Food Insecurity (09/16/2021)   Hunger Vital Sign    Worried About Running Out of Food in the Last Year: Never true    Ran Out of Food in the Last Year: Never true  Transportation Needs: No Transportation Needs (09/16/2021)   PRAPARE - Hydrologist (Medical): No    Lack of Transportation (Non-Medical): No  Physical Activity: Insufficiently Active (09/16/2021)   Exercise Vital Sign    Days of Exercise per Week: 7 days    Minutes of Exercise per Session: 20 min  Stress: No Stress Concern Present (09/16/2021)   [place]    Feeling of Stress : Not at all  Social Connections: Unknown (09/16/2021)   Social Connection and Isolation Panel [NHANES]    Frequency of Communication with Friends and Family: More than three times a week    Frequency of Social Gatherings with Friends and Family: More than three times a week    Attends Religious Services: Not on Advertising copywriter or Organizations: Not on file    Attends Archivist Meetings: Not on file    Marital Status: Not on file     Review of Systems  Constitutional:  Positive for fatigue. Negative for unexpected weight change.  HENT:  Negative for congestion and sinus pressure.   Respiratory:  Negative for cough, chest tightness and shortness of breath.   Cardiovascular:  Negative for chest pain, palpitations and leg swelling.  Gastrointestinal:  Positive for abdominal pain, constipation and nausea. Negative for vomiting.  Genitourinary:  Negative for difficulty urinating and dysuria.  Musculoskeletal:  Negative for joint swelling and myalgias.  Skin:        Skin change as outlined - abdomen.   Neurological:  Negative for dizziness and headaches.  Psychiatric/Behavioral:  Negative for agitation and dysphoric mood.        Objective:     BP 126/72   Pulse 86   Temp 98 F (36.7 C)   Resp 16   Ht '5\' 3"'$  (1.6 m)   Wt 192 lb (87.1 kg)   SpO2 99%   BMI 34.01 kg/m  Wt Readings from Last 3 Encounters:  06/02/22 192 lb (87.1 kg)  03/05/22 192 lb (87.1 kg)  01/31/22 196 lb 6.4 oz (89.1 kg)    Physical Exam Vitals reviewed.  Constitutional:      General: She is not in acute distress.    Appearance: Normal appearance.  HENT:     Head: Normocephalic and atraumatic.     Right Ear: External ear normal.     Left Ear: External ear normal.  Eyes:     General: No scleral icterus.       Right eye: No discharge.        Left eye: No discharge.     Conjunctiva/sclera:  Conjunctivae normal.  Neck:     Thyroid: No thyromegaly.  Cardiovascular:     Rate and Rhythm: Normal rate and regular rhythm.  Pulmonary:     Effort: No respiratory distress.     Breath sounds: Normal breath sounds. No wheezing.  Abdominal:     General: Bowel sounds are normal.  Palpations: Abdomen is soft.     Tenderness: There is no abdominal tenderness.  Musculoskeletal:        General: No swelling or tenderness.     Cervical back: Neck supple. No tenderness.  Lymphadenopathy:     Cervical: No cervical adenopathy.  Skin:    Comments: Erythematous - patches - abdomen.   Neurological:     Mental Status: She is alert.  Psychiatric:        Mood and Affect: Mood normal.        Behavior: Behavior normal.      Outpatient Encounter Medications as of 06/02/2022  Medication Sig   acetaminophen (TYLENOL) 500 MG tablet Take 1,000 mg by mouth daily as needed for mild pain or headache.    albuterol (VENTOLIN HFA) 108 (90 Base) MCG/ACT inhaler Inhale 2 puffs into the lungs every 6 (six) hours as needed for wheezing or shortness of breath.   busPIRone (BUSPAR) 5 MG tablet Take 1-2 tablets (5-10 mg total) by mouth 3 (three) times daily.   famotidine (PEPCID) 20 MG tablet TAKE 1 TABLET BY MOUTH TWICE A DAY   fluticasone (FLOVENT HFA) 110 MCG/ACT inhaler INHALE 2 PUFFS BY MOUTH INTO THE LUNGS TWICE A DAY   hydrocortisone (ANUSOL-HC) 25 MG suppository Place 1 suppository (25 mg total) rectally every 12 (twelve) hours.   imipramine (TOFRANIL) 25 MG tablet Take 1 tablet (25 mg total) by mouth 2 (two) times daily.   pantoprazole (PROTONIX) 40 MG tablet Take 1 tablet (40 mg total) by mouth 2 (two) times daily before a meal.   sucralfate (CARAFATE) 1 GM/10ML suspension Take 10 mLs (1 g total) by mouth 4 (four) times daily -  with meals and at bedtime.   tolterodine (DETROL LA) 4 MG 24 hr capsule Take 1 capsule (4 mg total) by mouth daily.   topiramate (TOPAMAX) 25 MG tablet Take 1 tablet (25 mg  total) by mouth 2 (two) times daily.   [DISCONTINUED] pantoprazole (PROTONIX) 40 MG tablet TAKE 1 TABLET BY MOUTH TWICE A DAY BEFORE A MEAL   [DISCONTINUED] topiramate (TOPAMAX) 25 MG tablet Take 1 tablet (25 mg total) by mouth 2 (two) times daily.   No facility-administered encounter medications on file as of 06/02/2022.     Lab Results  Component Value Date   WBC 4.5 01/28/2022   HGB 12.9 01/28/2022   HCT 39.5 01/28/2022   PLT 279.0 01/28/2022   GLUCOSE 104 (H) 05/29/2022   CHOL 189 05/29/2022   TRIG 98.0 05/29/2022   HDL 74.40 05/29/2022   LDLCALC 95 05/29/2022   ALT 17 05/29/2022   AST 17 05/29/2022   NA 139 05/29/2022   K 3.8 05/29/2022   CL 104 05/29/2022   CREATININE 1.04 05/29/2022   BUN 15 05/29/2022   CO2 27 05/29/2022   TSH 2.47 01/28/2022   HGBA1C 5.4 10/03/2019    MM 3D SCREEN BREAST BILATERAL  Result Date: 12/03/2021 CLINICAL DATA:  Screening. EXAM: DIGITAL SCREENING BILATERAL MAMMOGRAM WITH TOMOSYNTHESIS AND CAD TECHNIQUE: Bilateral screening digital craniocaudal and mediolateral oblique mammograms were obtained. Bilateral screening digital breast tomosynthesis was performed. The images were evaluated with computer-aided detection. COMPARISON:  Previous exam(s). ACR Breast Density Category b: There are scattered areas of fibroglandular density. FINDINGS: There are no findings suspicious for malignancy. IMPRESSION: No mammographic evidence of malignancy. A result letter of this screening mammogram will be mailed directly to the patient. RECOMMENDATION: Screening mammogram in one year. (Code:SM-B-01Y) BI-RADS CATEGORY  1: Negative. Electronically Signed  By: Margarette Canada M.D.   On: 12/03/2021 12:54       Assessment & Plan:  Hypercholesteremia Assessment & Plan: The 10-year ASCVD risk score (Arnett DK, et al., 2019) is: 5.9%   Values used to calculate the score:     Age: 46 years     Sex: Female     Is Non-Hispanic African American: No     Diabetic: No      Tobacco smoker: No     Systolic Blood Pressure: 123XX123 mmHg     Is BP treated: No     HDL Cholesterol: 74.4 mg/dL     Total Cholesterol: 189 mg/dL  Low cholesterol diet and exercise.  Follow lipid panel.   Orders: -     Lipid panel; Future -     Hepatic function panel; Future -     Basic metabolic panel; Future  Family history of hemochromatosis -     CBC with Differential/Platelet -     IBC + Ferritin  Other fatigue -     Cortisol-am, blood  Other orders -     Pantoprazole Sodium; Take 1 tablet (40 mg total) by mouth 2 (two) times daily before a meal.  Dispense: 180 tablet; Refill: 3 -     Topiramate; Take 1 tablet (25 mg total) by mouth 2 (two) times daily.  Dispense: 180 tablet; Refill: 1     Einar Pheasant, MD

## 2022-06-02 NOTE — Assessment & Plan Note (Signed)
Breathing appears to be stable.  Has rescue inhaler if needed.  Just evaluated 11/2021 - pulmonary.  Continue flovent.  Stable.

## 2022-06-02 NOTE — Assessment & Plan Note (Signed)
Mother with hemochromatosis.  Check iron studies.

## 2022-06-03 ENCOUNTER — Other Ambulatory Visit: Payer: Self-pay

## 2022-06-03 ENCOUNTER — Telehealth: Payer: Self-pay | Admitting: *Deleted

## 2022-06-03 DIAGNOSIS — E611 Iron deficiency: Secondary | ICD-10-CM

## 2022-06-03 LAB — CORTISOL-AM, BLOOD: Cortisol - AM: 12.7 ug/dL

## 2022-06-03 NOTE — Assessment & Plan Note (Signed)
Has been evaluated and followed by urology. Imipramine.

## 2022-06-03 NOTE — Telephone Encounter (Signed)
Left message for patient to call office. Per Dr. Tarri Glenn try to have her move up her appointment.

## 2022-06-03 NOTE — Assessment & Plan Note (Signed)
EGD February 2022-gastritis.  Continue proton pump inhibitor and Pepcid.  Continues to follow-up with GI (Dr. Tarri Glenn). W/up as outlined.  Normal gastric emptying study.  Being referred to [facility].

## 2022-06-04 NOTE — Telephone Encounter (Addendum)
Patient moved up appointment to 06/06/22

## 2022-06-06 ENCOUNTER — Encounter: Payer: Self-pay | Admitting: Gastroenterology

## 2022-06-06 ENCOUNTER — Ambulatory Visit: Payer: Medicare HMO | Admitting: Gastroenterology

## 2022-06-06 VITALS — BP 124/76 | HR 72 | Ht 63.0 in | Wt 194.0 lb

## 2022-06-06 DIAGNOSIS — R109 Unspecified abdominal pain: Secondary | ICD-10-CM

## 2022-06-06 DIAGNOSIS — K5909 Other constipation: Secondary | ICD-10-CM

## 2022-06-06 DIAGNOSIS — D509 Iron deficiency anemia, unspecified: Secondary | ICD-10-CM

## 2022-06-06 DIAGNOSIS — R112 Nausea with vomiting, unspecified: Secondary | ICD-10-CM

## 2022-06-06 DIAGNOSIS — R748 Abnormal levels of other serum enzymes: Secondary | ICD-10-CM

## 2022-06-06 NOTE — Progress Notes (Signed)
Referring Provider: Einar Pheasant, MD Primary Care Physician:  Einar Pheasant, MD  Chief complaint:  Rectal bleeding, abdominal pain   IMPRESSION:  Unexplained nausea and abdominal pain     - gastric emptying scan and CT scan were normal    - EGD showed reflux and gastritis as well as some retained food    - ? Functional dyspepsia, gastroparesis    - initially improved on low dose buspirone, no change with higher dose Low ferritin with normal iron and hemoglobin levels History of mildly elevated abnormal alk phos     - normal alk phos isoenzymes    - other liver enzymes are normal Reflux and H pylori negative gastritis on EGD 2022 Moderate pancreatic atrophy by MRI but not seen on more recent CT Chronic constipation with some improvement on Motegrity    - Amitiza resulted in abdominal cramping    - insurance would not cover Linzess    - Motegrity is cost-prohibitive    - dicyclomine provides some relief    - stool burden seen on recent abdominal x-rays Colonic diverticulosis by CT Family history of colon cancer (mother at age 67, sister in her 42s) Cholecystectomy  Iron deficiency without anemia. Some painless rectal bleeding attributed to internal hemorrhoids based on exam and on last colonoscopy with Dr. Vira Agar. Recommending a capsule endoscopy now.  Low threshold to repeat colonoscopy if iron deficiency progresses.   Nausea and abdominal pain. Persists despite initial improvement on Buspar.  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 Reflux and gastritis thought to be contributing in the past given prior EGD results. Normal gastric emptying scan and CT scan. Had minimal improvement in the past with PPI BID and H2B, dicyclomine, more aggressively treating constipation, trial of FDGuard and peppermint. I have recommended a referral to a tertiary care center. [facility] declined. Awaiting [facility].   Chronic constipation: Insurance denied Linzess. Miralax did not help. Amitiza caused diarrhea.  Motegrity was not effective enough for the out-of-pocket expense. Continue  dietary recommendations. Could consider Trulance.   Family of colon cancer: Last colonoscopy 2020. Repeat colonoscopy in 2025, earlier with new symptoms. Consider genetic counseling.   Elevated alkaline phosphatase without extrahepatic biliary dilatation on ultrasound, CT, or MRI.  Isoenzymes normal. Follow-up alk phos normal. Recent testing shows slight increase. Continue close monitoring. Planning liver biopsy if alk phos levels rise.   PLAN: - Continue pantoprazole 40 mg BID and famotidine 20 mg BID and Carafate PRN - Will use Miralax or magnesium oxide or diet options to improve constipation (see patient instructions), declined trial of additional prescription therapies - Continue buspirone 10 mg prior to meals - Take iron every other day to minimize side effects - Capsule endoscopy for further evaluation - Referral for genetic counseling given the family history of colon cancer - Referral to [facility] for a second opinion - Colonoscopy 2025, earlier if any additional rectal bleeding or progressive anemia if capsule endoscopy is negative - Office follow-up in 3-4 months, earlier if needed  Please see the "Patient Instructions" section for addition details about the plan.  HPI: NOELE CIACCIA is a 67 y.o. female who returns in follow-up for evaluation of abdominal pain, constipation, and recent rectal bleeding. She was last seen in the office 03/05/22. The interval history is obtained through the patient and review of her electronic health record.  I had a recent message from Dr. Nicki Reaper asking [place] to follow-up with Jenny Reichmann given her anemia.  Initially referred by Dr. Nicki Reaper for an isolated  elevated alk phos first identified in 2019. No change in alk phos seen after cholecystectomy 11/04/19 for chronic cholecystitis, cholelithiasis although the magnitude of the elevation has fluctuated over the years.  However, most recently the alk phos was normal. Evaluation including labs, abdominal ultrasound, contrast CT, and MRI/MRCP was remarked only for moderate pancreatic atrophy. Pancreatic elastase was normal.  She had an EGD 05/01/2020 to evaluate multiple chronic GI symptoms including regurgitation, nausea, globus and chest pain that radiated to the back despite pantoprazole 40 mg BID for several years for reflux showed reflux esophagitis, gastritis, fundic gland polyps, and retained food.  Duodenal biopsies were normal.  There was no H. pylori.  I recommended that she continue her PPI twice daily and famotidine twice daily.  She was asked to avoid all NSAIDs.  Gastric emptying scan 05/28/2020 was normal  At time of her office visit 11/02/20 her primary complaint was regurgitation, nausea, and epigastric pain - not nearly as bad. Having a bowel movement weekly. GI symptoms may worsen as constipation progresses. Sense of complete evacuation. No improvement with Linzess. Miralax caused loose stools.  Insurance would not cover Linzess. She tried some samples but this did not provide relief.   Alpha gal testing negative 01/22/21  When seen in follow-up 05/20/21 she reported abdominal pain and nausea - especially after meals.  There was also some nocturnal nausea, although she's not sure if the nausea wakes her from her sleep. Symptoms are worse than when she was here in 2022 and particularly bad after eating or soon after eating. Pain starts in the upper epigastrium and radiates to the RUQ. No heartburn.  Her fatigue is also worse, and she thinks this may be due to poor sleep related to the nausea.   Trial of Amitiza caused severe abdominal cramping.   CT abd/pelvis with contrast 05/27/21: fatty liver, prior cholecystectomy, small hiatal hernia, colonic diverticulosis; pancreas was reported as normal.  No pancreatic ductal dilation.  Office follow-up 07/16/21: Trial of dicyclomine and Motegrity may provide some  relief. Otherwise, non-prescription treatments as discussed at the time of her last office visit have not provided any meaningful relief.   Office visit 10/23/21: Returned after starting Buspar 5mg  prior to meals 10/03/21. She has been pain free for 4 days this week - which is an improvement.  Despite Motegrity she will have a bowel movement every day to several days. Some progression in pain as the constipation worsens.   Abdominal x-ray 11/20/21 to evaluate for kidney stones: Extensive stool throughout the colon, no kidney stones  Office visit 03/05/2022. Overall she may be better. However, she has continued to have some abdominal pain. Had some painless rectal bleeding both with and without bowel movement last wek. She didn't think the Motegrity was worth $100/month. She is having a bowel movement 1-2 times a week without it. She thinks the improvement is related to using buspirone 5 mg prior to meals. Using [place] PRN but only needed a couple of times since she was last here.   Returns today in follow-up.  She has had persistent symptoms despite Protonix, Carafate, Pepcid, and BuSpar.  She has recently started FD guard without any change in symptoms.  [facility] declined an outpatient consultation for a second opinion.  She is awaiting referral to [facility].  Recent labs obtained by Dr. Nicki Reaper to evaluate a family history of hemochromatosis showed a ferritin of 9.2, iron 72, Hemoglobin 13.4, MCV 83.3, RDW 14.8, platelets 238  She has had intermittent rectal  bleeding but no other overt bleeding. No new GI symptoms.    Endoscopic history: - EGD for RUQ pain and Colonoscopy 08/2018 with Dr. Vira [facility]: hiatal hernia, no H pylori, small hyperplastic polyp, diverticulosis, small internal hemorrhoids - EGD 05/01/2020 showed reflux, gastritis, fundic gland polyps, and retained food.  Duodenal biopsies were normal.  There was no H. pylori.  I recommended that she continue her PPI twice daily  and famotidine twice daily.  She was asked to avoid all NSAIDs.  Labs 09/18/21 showed a normal CBC, normal hepatic function panel except for alk phos of 130  Past Medical History:  Diagnosis Date   Allergy    Arthritis    Asthma    Chicken pox    Complication of anesthesia    GERD (gastroesophageal reflux disease)    History of kidney stones    Migraine headache    PONV (postoperative nausea and vomiting)    Urine incontinence     Past Surgical History:  Procedure Laterality Date   BREAST CYST ASPIRATION Bilateral    neg   carpal tunnel sugery     bilateral   CHOLECYSTECTOMY N/A 11/04/2019   Procedure: LAPAROSCOPIC CHOLECYSTECTOMY WITH INTRAOPERATIVE CHOLANGIOGRAM;  Surgeon: Robert Bellow, MD;  Location: [facility] ORS;  Service: General;  Laterality: N/A;   COLONOSCOPY WITH PROPOFOL N/A 09/20/2018   Procedure: COLONOSCOPY WITH PROPOFOL;  Surgeon: Manya Silvas, MD;  Location: [facility] ENDOSCOPY;  Service: Endoscopy;  Laterality: N/A;   ELBOW SURGERY  1999   ESOPHAGOGASTRODUODENOSCOPY (EGD) WITH PROPOFOL N/A 09/20/2018   Procedure: ESOPHAGOGASTRODUODENOSCOPY (EGD) WITH PROPOFOL;  Surgeon: Manya Silvas, MD;  Location: [facility] ENDOSCOPY;  Service: Endoscopy;  Laterality: N/A;   HAND SURGERY  1999   broken finger - pins   NASAL SINUS SURGERY  1992   ROTATOR CUFF REPAIR  1994   Trigger finger surgery  1999 and [facility]   secondary to fibroids    Current Outpatient Medications  Medication Sig Dispense Refill   acetaminophen (TYLENOL) 500 MG tablet Take 1,000 mg by mouth daily as needed for mild pain or headache.      albuterol (VENTOLIN HFA) 108 (90 Base) MCG/ACT inhaler Inhale 2 puffs into the lungs every 6 (six) hours as needed for wheezing or shortness of breath. 1 each 10   busPIRone (BUSPAR) 5 MG tablet Take 1-2 tablets (5-10 mg total) by mouth 3 (three) times daily. 180 tablet 3   famotidine (PEPCID) 20 MG tablet TAKE 1 TABLET BY MOUTH TWICE A DAY  60 tablet 11   fluticasone (FLOVENT HFA) 110 MCG/ACT inhaler INHALE 2 PUFFS BY MOUTH INTO THE LUNGS TWICE A DAY 12 g 10   hydrocortisone (ANUSOL-HC) 25 MG suppository Place 1 suppository (25 mg total) rectally every 12 (twelve) hours. 12 suppository 1   imipramine (TOFRANIL) 25 MG tablet Take 1 tablet (25 mg total) by mouth 2 (two) times daily. 180 tablet 3   pantoprazole (PROTONIX) 40 MG tablet Take 1 tablet (40 mg total) by mouth 2 (two) times daily before a meal. 180 tablet 3   sucralfate (CARAFATE) 1 GM/10ML suspension Take 10 mLs (1 g total) by mouth 4 (four) times daily -  with meals and at bedtime. 1200 mL 2   tolterodine (DETROL LA) 4 MG 24 hr capsule Take 1 capsule (4 mg total) by mouth daily. 90 capsule 3   topiramate (TOPAMAX) 25 MG tablet Take 1 tablet (25 mg total) by mouth  2 (two) times daily. 180 tablet 1   No current facility-administered medications for this visit.    Allergies as of 06/06/2022 - Review Complete 06/02/2022  Allergen Reaction Noted   Demerol [meperidine] Nausea Only 01/16/2012     Physical Exam: Weight 05/20/21 189 pounds Weight 08/15/21: 192 pounds General:   Alert,  well-nourished, pleasant and cooperative in NAD Head:  Normocephalic and atraumatic. Eyes:  Sclera clear, no icterus.   Conjunctiva pink. No xanthelasma.  Abdomen:  Soft, I am unable to reproduce her pain but she localizes it to the midline port scar from her cholecystectomy, nondistended, normal bowel sounds, no rebound or guarding. No hepatosplenomegaly.  Rectal:  No chemical dermatitis. No external hemorrhoids, fissure or fistula. No prolapsing hemorrhoids. Palpable internal hemorrhoids. No rectal prolapse. Normal anocutaneous reflex. No stool in the rectal vault. No mass or fecal impaction. Normal anal resting tone.  Chaperone: Heather Neurologic:  Alert and  oriented x4;  grossly nonfocal Skin: No rash or bruise. No spider angioma, palmar erythema, or Terry nails.  Psych:  Alert and  cooperative. Normal mood and affect.     Giuliana Handyside L. Tarri Glenn, MD, MPH 06/06/2022, 1:26 PM

## 2022-06-06 NOTE — Patient Instructions (Addendum)
It was my pleasure to provide care to you today. Based on our discussion, I am providing you with my recommendations below:  RECOMMENDATION(S):   Continue pantoprazole 40 mg twice daily , famotidine 20 mg twice daily  and Carafate as needed.  Use Miralax or magnesium oxide or diet options to improve constipation.   Continue buspirone 10 mg three times daily  to see if this further improves  symptoms  Capsule endoscopy-   CAPSULE ENDOSCOPY PATIENT INSTRUCTION SHEET  Savannah Gomez 1955/05/10 [id number]   06/10/22 Seven (7) days prior to capsule endoscopy stop taking iron supplements and carafate.  06/15/22 Two (2) days prior to capsule endoscopy stop taking aspirin or any arthritis drugs.  06/16/22 Day before capsule endoscopy purchase a 238 gram bottle of Miralax from the laxative section of your drug store, and a 32 oz. bottle of Gatorade (no red).    06/16/22 One (1) day prior to capsule endoscopy: Stop smoking. Eat a regular diet until 12:00 Noon. After 12:00 Noon take only the following: Black coffee  Jell-O (no fruit or red Jell-o) Water   Bouillon (chicken or beef) 7-Up   Cranberry Juice Tea   Kool-Aid Popsicle (not red) Sprite   Coke Ginger Ale  Pepsi Mountain Dew Gatorade At 6:00 pm the evening before your appointment, drink 7 capfuls (105 grams) of Miralax with 32 oz. Gatorade. Drink 8 oz every 15 minutes until gone. Nothing to eat or drink after midnight except medications with a sip of water.  06/17/22 Day of capsule endoscopy:  No medications for 2 hours prior to your test.  Please arrive at [facility]  3rd floor patient registration area by 8:30 am on: 06/17/22.   For any questions: Call [place] at [phone] and ask to speak with one of the capsule endoscopy nurses.  YOU WILL NEED TO RETURN THE EQUIPMENT AT 4 PM ON THE DAY OF THE PROCEDURE.  PLEASE KEEP THIS IN MIND WHEN SCHEDULING.    Small Bowel Capsule Endoscopy  What you should  know: Small Bowel capsule endoscopy is a procedure that takes pictures of the inside of your small intestine (bowel).  Your small bowel connects to your stomach on one end, and your large bowel (colon) on the other.  A capsule endoscopy is done by swallowing a pill size camera.  The capsule moves through your stomach and into your small bowel, where pictures are taken.   You may need a small bowel capsule endoscopy if you have symptoms, such as blood in your stool, chronic stomach pain, and diarrhea.  The pictures may show if you have growths, swelling, and bleeding area in you small bowel.  A capsule endoscopy may also show if diseases such as Crohn's or celiac disease are causing your symptoms.  Having a small bowel capsule endoscopy may help you and your caregiver learn the cause of your symptoms.  Learning what is causing your symptoms allows you to receive needed treatment and prevent further problems. Risks: You may have stomach pain during your procedure.   The pictures taken by the capsule may not be clear.   The pictures may not show the cause of your symptoms.   You may need another endoscopy procedure.   The capsule may get trapped in your esophagus or intestines. You may need surgery or additional procedures to remove the capsule from your body.    Before your procedure: You will be instructed to stop certain prescription medications or over- the -counter medications prior  to the procedure.   The day before your scheduled appointment you will need to be on a restricted diet and will need to drink a bowel prep that will clean out your bowels.   The day of the procedure: You may drive yourself to the procedure.   You will need to plan on 2 trips to the office on the day of the procedure. Morning: Plan to be at the office about 45 minutes. The morning of the procedure a sensor belt and recorder will be placed on you.  You will wear this for 8 hours.  (The sensor belt transfers pictures  of your small bowel to the recorder.)   You will be given a pill-sized capsule endoscope to swallow.  Once you swallow the capsule it will travel through your body the same way food does, constantly taking pictures along the way.  The capsule takes 2-3 pictures a second.   Once you have left the office you may go about your normal day with a few exceptions: You may not go near a MRI machine or a radio or television towers; You need to avoid other patients having capsule endoscopy; You will be given a written diet to follow for the day.  Afternoon: You will need to be return to the office at your designated time. The sensors belt will be removed You will need to be at the office about 15 minutes.   Colonoscopy 2025, earlier if any additional rectal bleeding  You have been referred to [facility] for 2nd opinion. Someone from their office will contact you to schedule. Please let us know if you have not heard from them in 1-2 weeks.    FOLLOW UP: On 07/16/22 at 8:50 am with Amy Esterwood PA-C   BMI: _______________________________________________________  If your blood pressure at your visit was 140/90 or greater, please contact your primary care physician to follow up on this.  _______________________________________________________  If you are age 33 or older, your body mass index should be between 23-30. Your Body mass index is 34.37 kg/m. If this is out of the aforementioned range listed, please consider follow up with your Primary Care Provider.  If you are age 39 or younger, your body mass index should be between 19-25. Your Body mass index is 34.37 kg/m. If this is out of the aformentioned range listed, please consider follow up with your Primary Care Provider.   ________________________________________________________    Anselm Jungling CHART:  The [facility] providers would like to encourage you to use [facility] to communicate with providers for non-urgent requests or questions.   Due to long hold times on the telephone, sending your provider a message by [facility] may be a faster and more efficient way to get a response.  Please allow 48 business hours for a response.  Please remember that this is for non-urgent requests.   Thank you for trusting me with your gastrointestinal care!    Thornton Park, MD, MPH

## 2022-06-24 ENCOUNTER — Ambulatory Visit (INDEPENDENT_AMBULATORY_CARE_PROVIDER_SITE_OTHER): Payer: Medicare HMO | Admitting: Internal Medicine

## 2022-06-24 ENCOUNTER — Ambulatory Visit: Payer: Medicare HMO | Admitting: Gastroenterology

## 2022-06-24 DIAGNOSIS — D509 Iron deficiency anemia, unspecified: Secondary | ICD-10-CM

## 2022-06-24 DIAGNOSIS — K2970 Gastritis, unspecified, without bleeding: Secondary | ICD-10-CM | POA: Diagnosis not present

## 2022-06-24 DIAGNOSIS — K2281 Esophageal polyp: Secondary | ICD-10-CM

## 2022-06-24 DIAGNOSIS — E611 Iron deficiency: Secondary | ICD-10-CM

## 2022-06-24 DIAGNOSIS — K2100 Gastro-esophageal reflux disease with esophagitis, without bleeding: Secondary | ICD-10-CM

## 2022-06-24 DIAGNOSIS — R638 Other symptoms and signs concerning food and fluid intake: Secondary | ICD-10-CM

## 2022-06-24 NOTE — Care Plan (Signed)
CAPSULE ID: [id number] Exp: 2023-10-20 [id number]  Patient arrived for capsule endoscopy. Reported the prep went well. Confirmed patient is fasting. Explained dietary restrictions for the next few hours. Patient verbalized understanding. Opened capsule, ensured capsule was flashing and transmitting to the recorder prior to the patient swallowing the capsule. Patient swallowed capsule without difficulty. Patient told to call the office with any questions. Understands to return to the office today between 4:00 and 4:30 pm. No further questions by the conclusion of the visit.

## 2022-06-24 NOTE — Patient Instructions (Signed)
POST CAPSULE INSTRUCTIONS:   Contact our office immediately at [phone] if you suffer from any abdominal pain, nausea, or vomiting during capsule endoscopy. Do not eat or drink for at least 2 hours. After 2 hours you may have any of the following to drink: Water                           White grape juice 7-Up                            Chicken Bouillon Sprite                           Ginger Ale  After 4 hours you may have a light snack to include any of the following: A cup of soup               sandwich Bowl of cereal             Rice Toast                           Eggs 2-3 small cookies (i.e. vanilla wafers or graham crackers)  Return to this office at 4:00 pm. After this you will be able to return to your normal diet.  During your procedure do not go near anyone else that is having capsule endoscopy.  Do not be in close contact with an MRI machine or a radio or television tower.   Do not wear a heavy coat or sweater because your recorder may over heat and stop recording.   Do not disconnect the equipment or remove the belt at any time.  Since the Data Recorder is actually a small computer, it should be treated with utmost care and protection.  Avoid sudden movement and banging of the Data Recorder.    Do not do any heavy lifting or strenuous physical activity during the test especially if it involves sweating.  Do not bend over or stoop during capsule endoscopy.  During capsule endoscopy, you will need to verify every 15 minutes that the small light on top of the Data Recorder is blinking twice per second.  If for some reason it stops blinking, record the time and contact our office at [phone].                     

## 2022-07-08 NOTE — Progress Notes (Signed)
VCE charge for procedure

## 2022-07-10 ENCOUNTER — Encounter: Payer: Self-pay | Admitting: Internal Medicine

## 2022-07-11 IMAGING — XA DG CHOLANGIOGRAM OPERATIVE
7 series · 15 of 20 positions shown · non-contrast
Comparison: Abdominal CT 07/26/2018

CLINICAL DATA: History of chronic cholecystitis and cholelithiasis.

EXAM:
INTRAOPERATIVE CHOLANGIOGRAM
TECHNIQUE: Cholangiographic images from the C-arm fluoroscopic device were
submitted for interpretation post-operatively. Please see the
procedural report for the amount of contrast and the fluoroscopy
time utilized.

[Series 1: cont. · 1 of 2 frames shown (1 of 7)]
[frame 1/2]
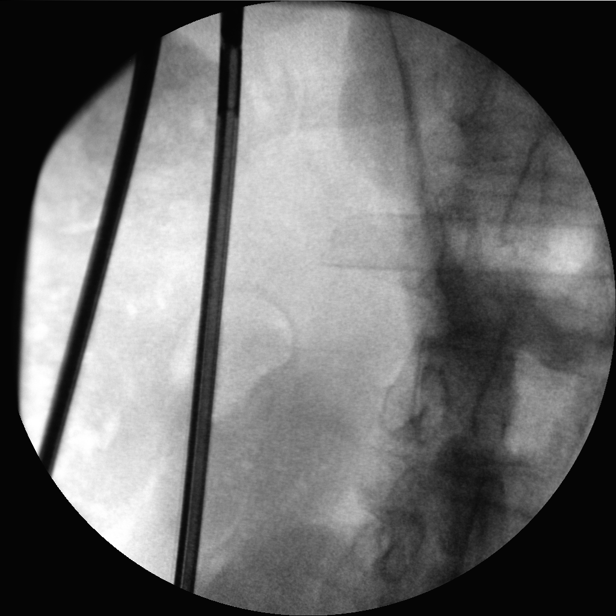

[Series 2: cont. · 2 of 2 frames shown (2 of 7)]
[frame 1/2]
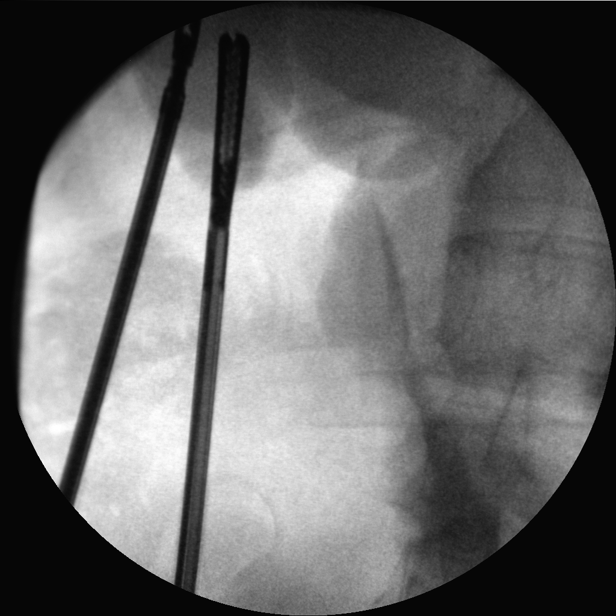
[frame 2/2]
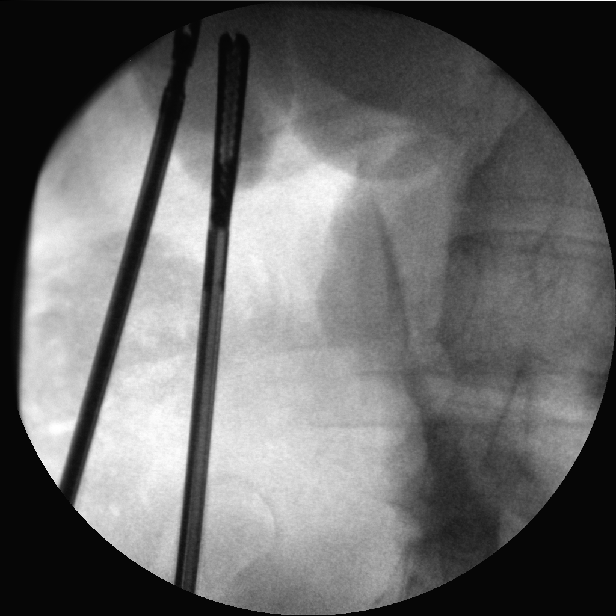

[Series 3: cont. · 3 of 50 frames shown (3 of 7)]
[frame 8/50]
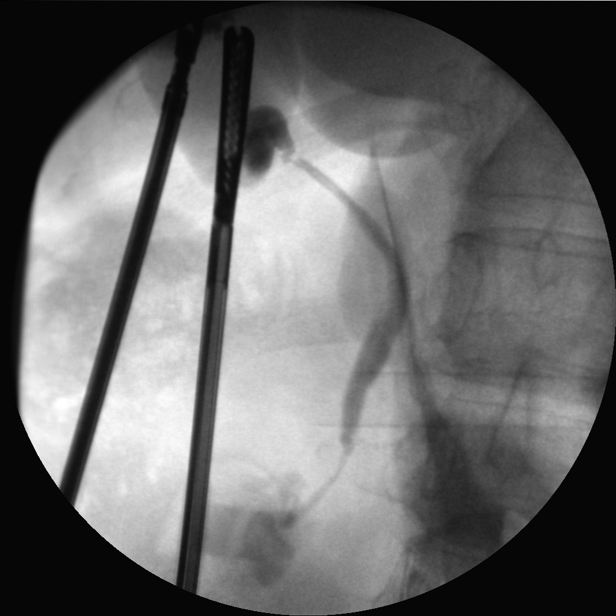
[frame 43/50]
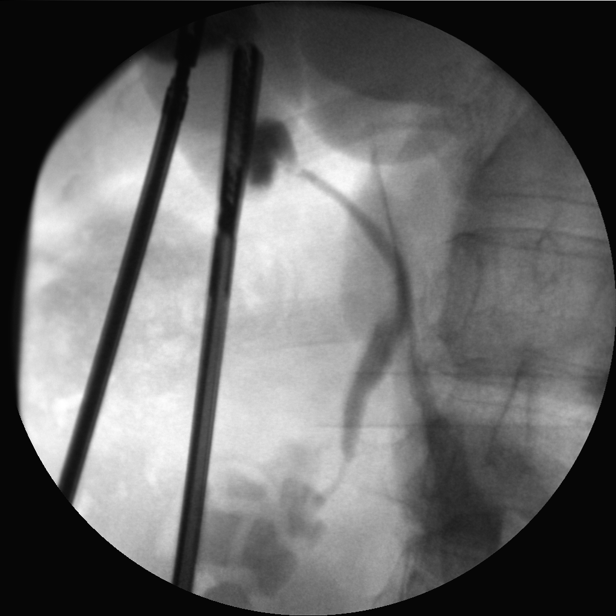
[frame 50/50  full-range]
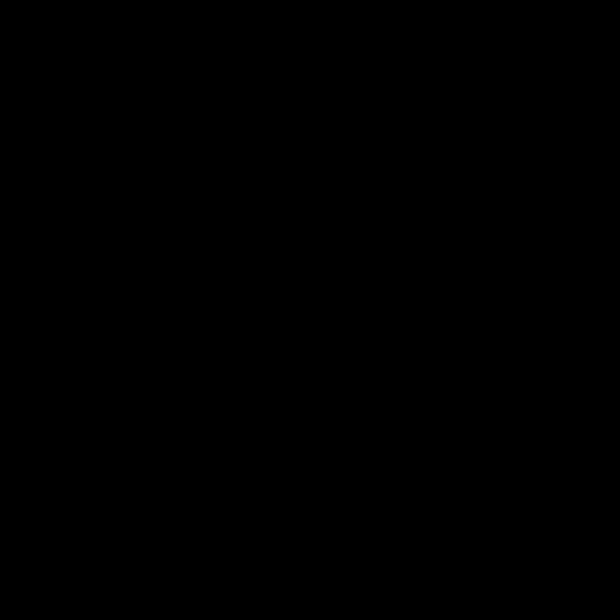

[Series 4: cont. · 1 of 2 frames shown (4 of 7)]
[frame 1/2]
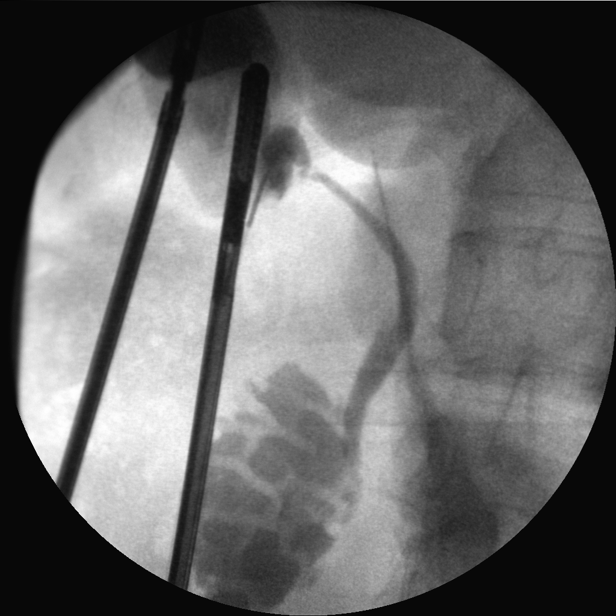

[Series 5: cont. · 2 of 2 frames shown (5 of 7)]
[frame 1/2]
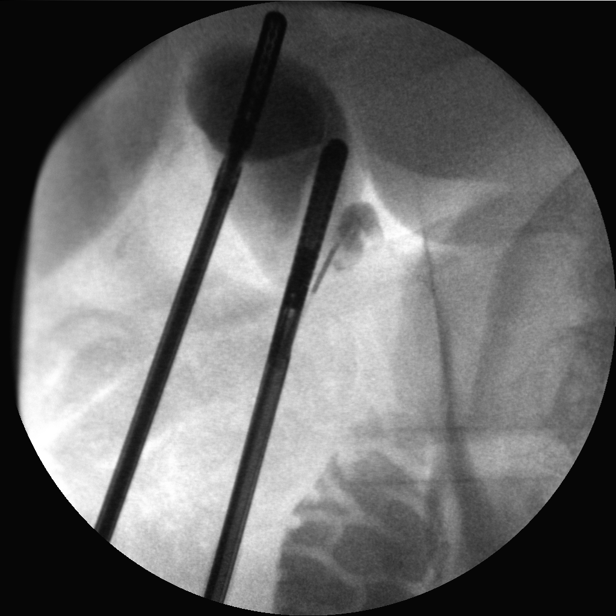
[frame 2/2]
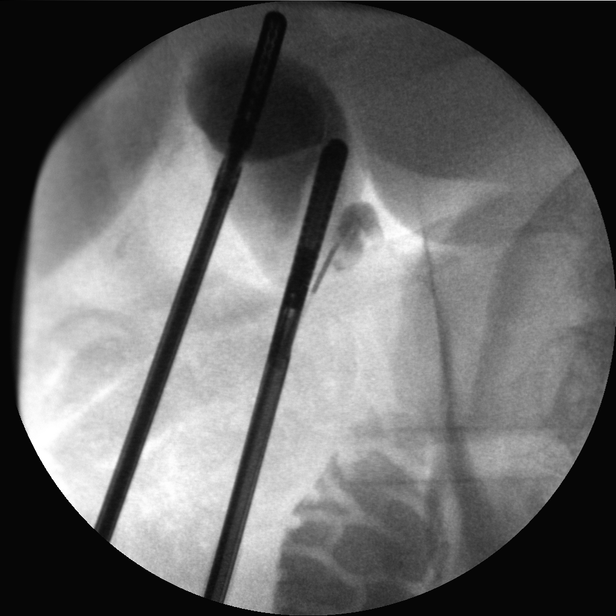

[Series 6: cont. · 3 of 29 frames shown (6 of 7)]
[frame 5/29]
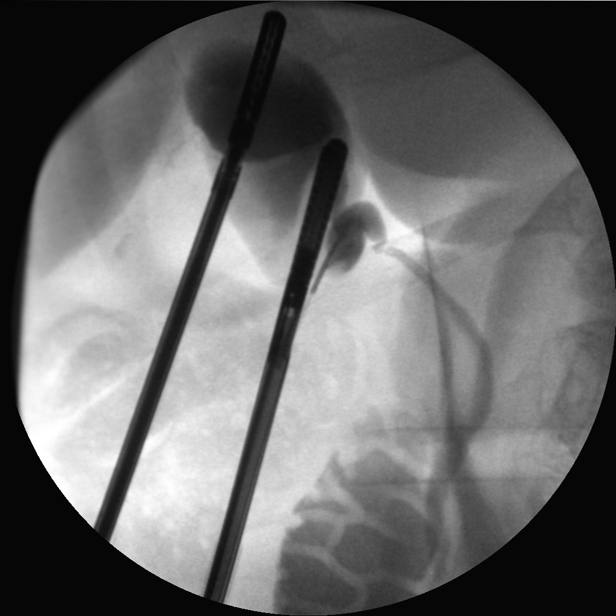
[frame 25/29]
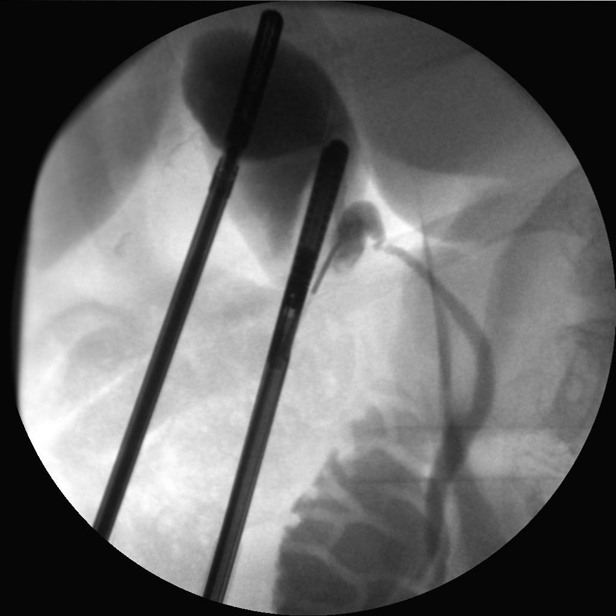
[frame 29/29]
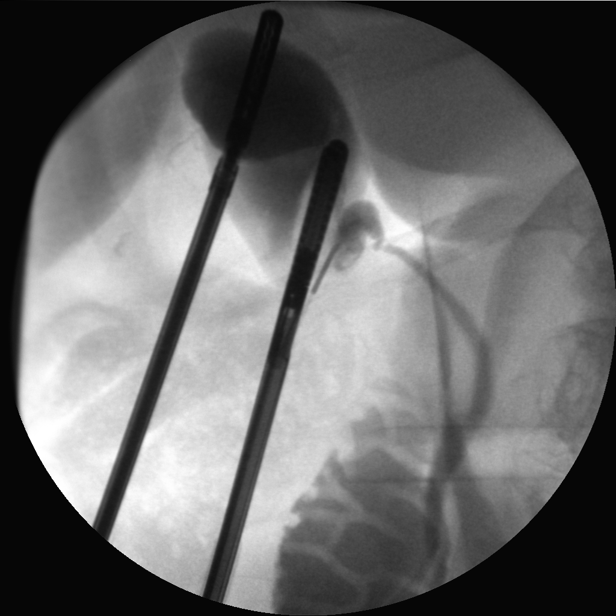

[Series 7: cont. · 3 of 31 frames shown (7 of 7)]
[frame 5/31]
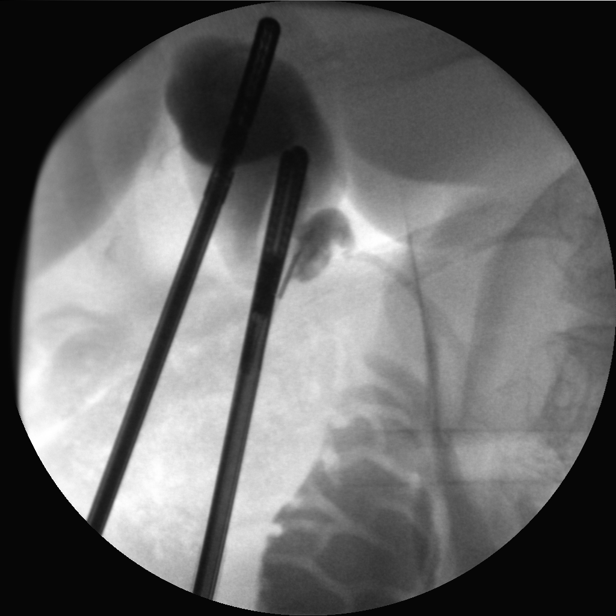
[frame 27/31]
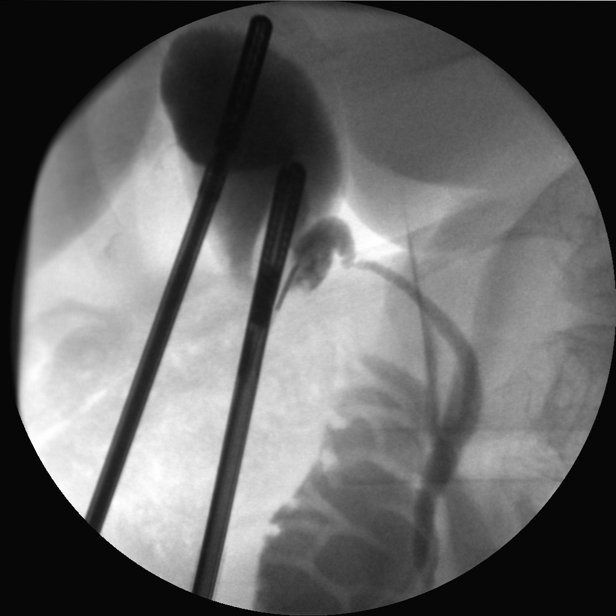
[frame 30/31]
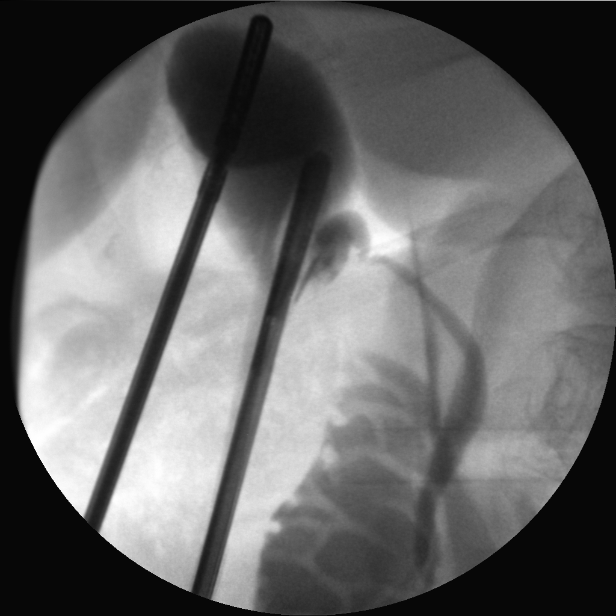

[15 of 20 positions shown; findings below may reference images not displayed]

FINDINGS: Contrast opacification of the cystic duct and common bile duct.
Contrast rapidly drains into the duodenum. No large filling defects
or stones in the common bile duct. Filling defects at the base of
the gallbladder could represent gas or small stones.
IMPRESSION: Patent common bile duct.

## 2022-07-16 ENCOUNTER — Ambulatory Visit: Payer: Medicare HMO | Admitting: Physician Assistant

## 2022-07-16 ENCOUNTER — Encounter: Payer: Self-pay | Admitting: Physician Assistant

## 2022-07-16 VITALS — BP 124/76 | HR 75 | Ht 63.0 in | Wt 191.0 lb

## 2022-07-16 DIAGNOSIS — Z800 Family history of malignant neoplasm of digestive organs: Secondary | ICD-10-CM

## 2022-07-16 DIAGNOSIS — Z8719 Personal history of other diseases of the digestive system: Secondary | ICD-10-CM | POA: Diagnosis not present

## 2022-07-16 DIAGNOSIS — D509 Iron deficiency anemia, unspecified: Secondary | ICD-10-CM | POA: Diagnosis not present

## 2022-07-16 DIAGNOSIS — R1030 Lower abdominal pain, unspecified: Secondary | ICD-10-CM

## 2022-07-16 DIAGNOSIS — K219 Gastro-esophageal reflux disease without esophagitis: Secondary | ICD-10-CM

## 2022-07-16 MED ORDER — CIPROFLOXACIN HCL 500 MG PO TABS: 500.0000 mg | ORAL_TABLET | Freq: Two times a day (BID) | ORAL | 0 refills | Status: AC

## 2022-07-16 MED ORDER — DICYCLOMINE HCL 10 MG PO CAPS: 10.0000 mg | ORAL_CAPSULE | Freq: Three times a day (TID) | ORAL | 6 refills | Status: DC | PRN

## 2022-07-16 MED ORDER — METRONIDAZOLE 500 MG PO TABS: 500.0000 mg | ORAL_TABLET | Freq: Two times a day (BID) | ORAL | 0 refills | Status: AC

## 2022-07-16 NOTE — Patient Instructions (Signed)
_______________________________________________________  If your blood pressure at your visit was 140/90 or greater, please contact your primary care physician to follow up on this. _______________________________________________________  If you are age 68 or older, your body mass index should be between 23-30. Your Body mass index is 33.83 kg/m. If this is out of the aforementioned range listed, please consider follow up with your Primary Care Provider. ________________________________________________________  The [facility] providers would like to encourage you to use [facility] to communicate with providers for non-urgent requests or questions.  Due to long hold times on the telephone, sending your provider a message by [facility] may be a faster and more efficient way to get a response.  Please allow 48 business hours for a response.  Please remember that this is for non-urgent requests.  _______________________________________________________  Savannah Gomez have been scheduled for a colonoscopy. Please follow written instructions given to you at your visit today.  Please pick up your prep supplies at the pharmacy within the next 1-3 days. If you use inhalers (even only as needed), please bring them with you on the day of your procedure.  Due to recent changes in healthcare laws, you may see the results of your imaging and laboratory studies on MyChart before your provider has had a chance to review them.  We understand that in some cases there may be results that are confusing or concerning to you. Not all laboratory results come back in the same time frame and the provider may be waiting for multiple results in order to interpret others.  Please give [place] 48 hours in order for your provider to thoroughly review all the results before contacting the office for clarification of your results.   We have sent the following medications to your pharmacy for you to pick up at your convenience:  START: Cipro   twice daily for 7 days. Take with food START: Flagyl  twice daily for 7 days.  Take with food  START: dicyclomine  one tablet 3 times daily as needed for abdominal pain and cramping.  Please purchase the following medications over the counter and take as directed:  TRY: Miralax 1/2 capful in 8 ounces of water every other other day.   Thank you for entrusting me with your care and choosing [facility].  Amy Esterwood, PA-C

## 2022-07-20 NOTE — Progress Notes (Signed)
Subjective:    Patient ID: Savannah Gomez, female    DOB: Feb 02, 1956, 67 y.o.   MRN: [id number]  HPI Savannah Gomez is a pleasant 67 year old white female, established with Dr. Orvan Falconer who comes in today for follow-up.  She has had a multitude of GI issues, including unexplained nausea and abdominal pain for which she has had prior workup with gastric emptying scan and CT scan both of which were unrevealing.  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  She has been on low-dose buspirone, no improvement in symptoms with higher doses. Also with diverticular disease and prior history of diverticulitis, family history of colon cancer in mother at age 16 and sister in her 49s, Status post cholecystectomy History of chronic constipation with some improvement on Motegrity. EGD 2022 with evidence of reflux, and H. pylori negative gastritis History of mildly elevated alk phos normal alk phos isoenzymes and other LFTs normal Iron deficiency without anemia.  Prior colonoscopy had been done per Dr. Mechele Collin, EGD in 2022 as above showing mild gastritis. She underwent capsule endoscopy 06/24/2022 which was a negative exam with adequate prep.  Recommendation was to consider repeat colonoscopy. Last labs 06/02/2022 hemoglobin 13.4/hematocrit 40.3 Serum iron 72/ferritin 9.2/TIBC 375 sat 19 Cortisol within normal limits Patient says she has been taking oral iron 325 mg every other day. She continues with chronic constipation but actually says the iron supplement seems to be helping with that a bit she will go as long as 4 to 5 days between bowel movements and does get abdominal discomfort if she does not have a bowel movement at least every 4 to 5 days.  She occasionally will see a small amount of blood on the tissue but has not been seeing any melena or blood mixed in with the stool.  She continues on Motegrity as needed but says it causes abdominal cramping and has been using MiraLAX more regularly. She  reports that she has had abdominal pain for a long time at least since prior to her cholecystectomy in 2021.  She says the pain is not every single day but she gets it frequently and often will have sharp pain in the mid to lower abdomen which she describes as cramping in nature. She also has chronic fatigue symptoms, says she has no energy most days. She continues to have significant reflux symptoms and continues to use Protonix 40 mg twice daily and famotidine 20 mg twice daily. Experiences frequent sour brash. She has also been having more left-sided abdominal discomfort over the past week or so and wonders if she may have diverticulitis.  She has not had any fever or chills, no changes in her bowel habits.    daReview of Systems.Pertinent positive and negative review of systems were noted in the above HPI section.  All other review of systems was otherwise negative.   Outpatient Encounter Medications as of 07/16/2022  Medication Sig   acetaminophen (TYLENOL) 500 MG tablet Take 1,000 mg by mouth daily as needed for mild pain or headache.    albuterol (VENTOLIN HFA) 108 (90 Base) MCG/ACT inhaler Inhale 2 puffs into the lungs every 6 (six) hours as needed for wheezing or shortness of breath.   busPIRone (BUSPAR) 5 MG tablet Take 1-2 tablets (5-10 mg total) by mouth 3 (three) times daily.   ciprofloxacin (CIPRO) 500 MG tablet Take 1 tablet (500 mg total) by mouth 2 (two) times daily for 7 days. Take with food   dicyclomine (BENTYL) 10 MG  capsule Take 1 capsule (10 mg total) by mouth 3 (three) times daily as needed for spasms (for abdominal pain and cramping).   famotidine (PEPCID) 20 MG tablet TAKE 1 TABLET BY MOUTH TWICE A DAY   fluticasone (FLOVENT HFA) 110 MCG/ACT inhaler INHALE 2 PUFFS BY MOUTH INTO THE LUNGS TWICE A DAY   hydrocortisone (ANUSOL-HC) 25 MG suppository Place 1 suppository (25 mg total) rectally every 12 (twelve) hours.   imipramine (TOFRANIL) 25 MG tablet Take 1 tablet (25 mg  total) by mouth 2 (two) times daily.   metroNIDAZOLE (FLAGYL) 500 MG tablet Take 1 tablet (500 mg total) by mouth 2 (two) times daily for 7 days. Take with food   pantoprazole (PROTONIX) 40 MG tablet Take 1 tablet (40 mg total) by mouth 2 (two) times daily before a meal.   sucralfate (CARAFATE) 1 GM/10ML suspension Take 10 mLs (1 g total) by mouth 4 (four) times daily -  with meals and at bedtime.   tolterodine (DETROL LA) 4 MG 24 hr capsule Take 1 capsule (4 mg total) by mouth daily.   topiramate (TOPAMAX) 25 MG tablet Take 1 tablet (25 mg total) by mouth 2 (two) times daily.   No facility-administered encounter medications on file as of 07/16/2022.   Allergies  Allergen Reactions   Demerol [Meperidine] Nausea Only   Patient Active Problem List   Diagnosis Date Noted   Family history of hemochromatosis 06/02/2022   Allergic rhinitis 12/09/2021   Lumbar radiculopathy 02/02/2021   Back pain 09/24/2020   History of COVID-19 02/14/2020   Chest pain 09/19/2019   Abdominal pain 03/06/2019   Gallstones 07/27/2018   Elevated alkaline phosphatase level 02/07/2018   Colon polyp 11/16/2017   Diverticulitis 11/16/2017   Irregular heart rhythm 11/16/2017   Finger pain, left 08/27/2017   Headache 04/02/2017   Sinusitis 02/22/2017   Rotator cuff tendinitis, left 10/27/2016   Status post left rotator cuff repair 10/27/2016   Stenosing tenosynovitis of finger 10/27/2016   Muscle spasm 12/05/2015   Asthma 10/17/2015   Dyspnea 10/17/2015   Cough 03/06/2015   Family history of colon cancer 11/07/2014   Health care maintenance 05/07/2014   Nephrolithiasis 02/28/2014   Fatigue 02/23/2013   Incomplete emptying of bladder 02/11/2012   Increased frequency of urination 02/11/2012   Symptoms involving urinary system 02/11/2012   Urge incontinence 02/11/2012   History of migraine headaches 01/18/2012   Interstitial cystitis 01/18/2012   Hypercholesteremia 01/18/2012   GERD (gastroesophageal  reflux disease) 01/18/2012   Social History   Socioeconomic History   Marital status: Married    Spouse name: Not on file   Number of children: Not on file   Years of education: Not on file   Highest education level: Not on file  Occupational History   Not on file  Tobacco Use   Smoking status: Never   Smokeless tobacco: Never  Vaping Use   Vaping Use: Never used  Substance and Sexual Activity   Alcohol use: No   Drug use: No   Sexual activity: Yes    Birth control/protection: None, Post-menopausal  Other Topics Concern   Not on file  Social History Narrative   Not on file   Social Determinants of Health   Financial Resource Strain: Low Risk  (09/16/2021)   Overall Financial Resource Strain (CARDIA)    Difficulty of Paying Living Expenses: Not hard at all  Food Insecurity: No Food Insecurity (09/16/2021)   Hunger Vital Sign    Worried About  Running Out of Food in the Last Year: Never true    Ran Out of Food in the Last Year: Never true  Transportation Needs: No Transportation Needs (09/16/2021)   PRAPARE - Administrator, Civil Service (Medical): No    Lack of Transportation (Non-Medical): No  Physical Activity: Insufficiently Active (09/16/2021)   Exercise Vital Sign    Days of Exercise per Week: 7 days    Minutes of Exercise per Session: 20 min  Stress: No Stress Concern Present (09/16/2021)   [facility] [employer] - Occupational Stress Questionnaire    Feeling of Stress : Not at all  Social Connections: Unknown (09/16/2021)   Social Connection and Isolation Panel [NHANES]    Frequency of Communication with Friends and Family: More than three times a week    Frequency of Social Gatherings with Friends and Family: More than three times a week    Attends Religious Services: Not on file    Active Member of Clubs or Organizations: Not on file    Attends Banker Meetings: Not on file    Marital Status: Not on file  Intimate  Partner Violence: Not At Risk (09/16/2021)   Humiliation, Afraid, Rape, and Kick questionnaire    Fear of Current or Ex-Partner: No    Emotionally Abused: No    Physically Abused: No    Sexually Abused: No    Savannah Gomez's family history includes Asthma in her mother; Breast cancer in her maternal grandmother; Colon cancer in her mother and sister; Diabetes in her maternal grandfather; Heart disease in her father; Hypertension in her father; Lung cancer in her maternal grandmother; Stroke in her maternal grandfather.      Objective:    Vitals:   07/16/22 0829  BP: 124/76  Pulse: 75    Physical Exam  Well-developed well-nourished WF  in no acute distress.  Height, Weight,191 BMI 33.8  HEENT; nontraumatic normocephalic, EOMI, PE R LA, sclera anicteric. Oropharynx; Neck; supple, no JVD Cardiovascular; regular rate and rhythm with S1-S2, no murmur rub or gallop Pulmonary; Clear bilaterally Abdomen; soft, nondistended, there is tenderness in the left lower quadrant, no guarding or rebound, no palpable mass or hepatosplenomegaly, bowel sounds are active Rectal; not done today Skin; benign exam, no jaundice rash or appreciable lesions Extremities; no clubbing cyanosis or edema skin warm and dry Neuro/Psych; alert and oriented x4, grossly nonfocal mood and affect appropriate        Assessment & Plan:   #52 67 year old white female with chronic abdominal pain present over the past 4 to 5 years, not present daily but frequent and somewhat migratory. She has had an extensive GI workup, probable component of dyspepsia and IBS-some improvement in symptoms with BuSpar 5 to 10 mg 3 times daily, and has had some benefit with dicyclomine in the past #2 chronic GERD-fair control EGD 2022 with changes of reflux esophagitis mild and mild H. pylori negative gastritis  #3 iron deficiency without anemia- #4 family history of colon cancer in patient's mother age 51s and sister age 69s #38.  Left  mid and left lower quadrant pain possible diverticulitis #6 chronic constipation #7.  Status post cholecystectomy  Plan; continue Protonix 40 mg p.o. twice daily before meals and famotidine 20 mg p.o. twice daily As she has not been using Motegrity due to cramping and sometimes MiraLAX will cause diarrhea we discussed using a half a dose of MiraLAX every other day and then titrate as needed Restart trial  of dicyclomine 10 mg p.o. up to 3 times daily AC For possible diverticulitis start Cipro 500 mg p.o. twice daily and metronidazole 500 mg p.o. twice daily x 7 days to be taken with food.  She was advised to call if symptoms have not resolved when she is nearing completion of antibiotics and we may extend out the dose. Will schedule for colonoscopy with Dr. Lavon Paganini .Colonoscopy was discussed in detail with patient including indications risk and benefits and she is agreeable to proceed.  We have intentionally schedule this out a few weeks to allow resolution of any mild diverticulitis. Patient will be established with Dr. Lavon Paganini  moving forward. Patient had been referred by Dr. Orvan Falconer for second opinion at [facility], she has an appointment in early May with a PA in their [place] office.  She asked me today if she really needs to go for a second opinion.  We discussed at length today about her abdominal pain and she accepts that she may have some chronic abdominal pain moving forward and may not have a definitive answer for this.  I do not think she has to go for second opinion at this time.  I am happy to see her in follow-up, along with Dr. Lavon Paganini.  Sammuel Cooper PA-C 07/20/2022   Cc: Dale Derby, MD

## 2022-07-21 ENCOUNTER — Encounter: Payer: Medicare HMO | Admitting: Gastroenterology

## 2022-07-21 ENCOUNTER — Encounter: Payer: Self-pay | Admitting: Gastroenterology

## 2022-07-22 ENCOUNTER — Other Ambulatory Visit (INDEPENDENT_AMBULATORY_CARE_PROVIDER_SITE_OTHER): Payer: Medicare HMO

## 2022-07-22 DIAGNOSIS — E611 Iron deficiency: Secondary | ICD-10-CM

## 2022-07-22 DIAGNOSIS — E7800 Pure hypercholesterolemia, unspecified: Secondary | ICD-10-CM | POA: Diagnosis not present

## 2022-07-22 LAB — HEPATIC FUNCTION PANEL
ALT: 24 U/L (ref 0–35)
AST: 22 U/L (ref 0–37)
Albumin: 4 g/dL (ref 3.5–5.2)
Alkaline Phosphatase: 129 U/L — ABNORMAL HIGH (ref 39–117)
Bilirubin, Direct: 0.1 mg/dL (ref 0.0–0.3)
Total Bilirubin: 0.4 mg/dL (ref 0.2–1.2)
Total Protein: 6.1 g/dL (ref 6.0–8.3)

## 2022-07-22 LAB — CBC WITH DIFFERENTIAL/PLATELET
Basophils Absolute: 0 10*3/uL (ref 0.0–0.1)
Basophils Relative: 0.4 % (ref 0.0–3.0)
Eosinophils Absolute: 0.5 10*3/uL (ref 0.0–0.7)
Eosinophils Relative: 9.4 % — ABNORMAL HIGH (ref 0.0–5.0)
HCT: 39.2 % (ref 36.0–46.0)
Hemoglobin: 12.9 g/dL (ref 12.0–15.0)
Lymphocytes Relative: 30.2 % (ref 12.0–46.0)
Lymphs Abs: 1.5 10*3/uL (ref 0.7–4.0)
MCHC: 33 g/dL (ref 30.0–36.0)
MCV: 85.6 fl (ref 78.0–100.0)
Monocytes Absolute: 0.3 10*3/uL (ref 0.1–1.0)
Monocytes Relative: 6.5 % (ref 3.0–12.0)
Neutro Abs: 2.7 10*3/uL (ref 1.4–7.7)
Neutrophils Relative %: 53.5 % (ref 43.0–77.0)
Platelets: 239 10*3/uL (ref 150.0–400.0)
RBC: 4.57 Mil/uL (ref 3.87–5.11)
RDW: 15.6 % — ABNORMAL HIGH (ref 11.5–15.5)
WBC: 5 10*3/uL (ref 4.0–10.5)

## 2022-07-22 LAB — BASIC METABOLIC PANEL
BUN: 11 mg/dL (ref 6–23)
CO2: 26 mEq/L (ref 19–32)
Calcium: 9.2 mg/dL (ref 8.4–10.5)
Chloride: 107 mEq/L (ref 96–112)
Creatinine, Ser: 0.93 mg/dL (ref 0.40–1.20)
GFR: 63.38 mL/min (ref >60.00)
Glucose, Bld: 98 mg/dL (ref 70–99)
Potassium: 4 mEq/L (ref 3.5–5.1)
Sodium: 140 mEq/L (ref 135–145)

## 2022-07-22 LAB — IBC + FERRITIN
Ferritin: 10.7 ng/mL (ref 10.0–291.0)
Iron: 55 ug/dL (ref 42–145)
Saturation Ratios: 17 % — ABNORMAL LOW (ref 20.0–50.0)
TIBC: 323.4 ug/dL (ref 250.0–450.0)
Transferrin: 231 mg/dL (ref 212.0–360.0)

## 2022-07-22 LAB — LIPID PANEL
Cholesterol: 165 mg/dL (ref 0–200)
HDL: 81.9 mg/dL (ref >39.00)
LDL Cholesterol: 71 mg/dL (ref 0–99)
NonHDL: 83.53
Total CHOL/HDL Ratio: 2
Triglycerides: 62 mg/dL (ref 0.0–149.0)
VLDL: 12.4 mg/dL (ref 0.0–40.0)

## 2022-07-25 ENCOUNTER — Telehealth: Payer: Self-pay | Admitting: Pharmacy Technician

## 2022-07-25 ENCOUNTER — Telehealth: Payer: Self-pay | Admitting: Internal Medicine

## 2022-07-25 ENCOUNTER — Other Ambulatory Visit (HOSPITAL_COMMUNITY): Payer: Self-pay

## 2022-07-25 NOTE — Telephone Encounter (Signed)
Patient Advocate Encounter  Received notification from [facility] that prior authorization for DICYCLOMINE 10MG  is required.   PA submitted on 5.3.24 Key [id number] Status is pending

## 2022-07-25 NOTE — Telephone Encounter (Signed)
Contacted [name] to schedule their annual wellness visit. Appointment made for 07/31/2022.  Thank you,  [facility] Support [facility] Direct dial  [phone]

## 2022-07-25 NOTE — Telephone Encounter (Signed)
Patient Advocate Encounter  Prior Authorization for DICYCLOMINE 10MG  has been approved with [employer].    PA# [id number] Effective dates: 1.1.24 through 12.31.24  Per [facility] test claim, unable to provide copay.

## 2022-07-29 ENCOUNTER — Encounter: Payer: Self-pay | Admitting: Gastroenterology

## 2022-07-29 ENCOUNTER — Ambulatory Visit (AMBULATORY_SURGERY_CENTER): Payer: Medicare HMO | Admitting: Gastroenterology

## 2022-07-29 VITALS — BP 125/63 | HR 80 | Temp 97.5°F | Resp 18 | Ht 63.0 in | Wt 191.0 lb

## 2022-07-29 DIAGNOSIS — Z800 Family history of malignant neoplasm of digestive organs: Secondary | ICD-10-CM | POA: Diagnosis not present

## 2022-07-29 DIAGNOSIS — D509 Iron deficiency anemia, unspecified: Secondary | ICD-10-CM | POA: Diagnosis not present

## 2022-07-29 DIAGNOSIS — D123 Benign neoplasm of transverse colon: Secondary | ICD-10-CM | POA: Diagnosis not present

## 2022-07-29 DIAGNOSIS — K219 Gastro-esophageal reflux disease without esophagitis: Secondary | ICD-10-CM | POA: Diagnosis not present

## 2022-07-29 DIAGNOSIS — K6389 Other specified diseases of intestine: Secondary | ICD-10-CM | POA: Diagnosis not present

## 2022-07-29 DIAGNOSIS — K635 Polyp of colon: Secondary | ICD-10-CM | POA: Diagnosis not present

## 2022-07-29 DIAGNOSIS — J45909 Unspecified asthma, uncomplicated: Secondary | ICD-10-CM | POA: Diagnosis not present

## 2022-07-29 MED ORDER — SODIUM CHLORIDE 0.9 % IV SOLN
500.0000 mL | Freq: Once | INTRAVENOUS | Status: DC
Start: 2022-07-29 — End: 2022-07-29

## 2022-07-29 NOTE — Op Note (Addendum)
[facility] Patient Name: Savannah Gomez Procedure Date: 07/29/2022 2:09 PM MRN: [id number] Endoscopist: Napoleon Form , MD, [id number] Age: 67 Referring MD:  Date of Birth: 03/26/1955 Gender: Female Account #: [id number] Procedure:                Colonoscopy Indications:              Unexplained iron deficiency anemia Medicines:                Monitored Anesthesia Care Procedure:                Pre-Anesthesia Assessment:                           - Prior to the procedure, a History and Physical                            was performed, and patient medications and                            allergies were reviewed. The patient's tolerance of                            previous anesthesia was also reviewed. The risks                            and benefits of the procedure and the sedation                            options and risks were discussed with the patient.                            All questions were answered, and informed consent                            was obtained. Prior Anticoagulants: The patient has                            taken no anticoagulant or antiplatelet agents. ASA                            Grade Assessment: II - A patient with mild systemic                            disease. After reviewing the risks and benefits,                            the patient was deemed in satisfactory condition to                            undergo the procedure.                           After obtaining informed consent, the colonoscope  was passed under direct vision. Throughout the                            procedure, the patient's blood pressure, pulse, and                            oxygen saturations were monitored continuously. The                            PCF-HQ190L Colonoscope [id number] was introduced                            through the anus and advanced to the the cecum,                            identified by  appendiceal orifice and ileocecal                            valve. The colonoscopy was performed without                            difficulty. The patient tolerated the procedure                            well. The quality of the bowel preparation was                            adequate. The terminal ileum, ileocecal valve,                            appendiceal orifice, and rectum were photographed. Scope In: 2:27:15 PM Scope Out: 2:47:58 PM Scope Withdrawal Time: 0 hours 14 minutes 21 seconds  Total Procedure Duration: 0 hours 20 minutes 43 seconds  Findings:                 The perianal and digital rectal examinations were                            normal.                           A 5 mm polyp was found in the transverse colon. The                            polyp was sessile. The polyp was removed with a                            cold snare. Resection and retrieval were complete.                           A localized area of mildly friable mucosa with no                            bleeding was found in the proximal rectum. Biopsies  were taken with a cold forceps for histology.                           Non-bleeding external and internal hemorrhoids were                            found during retroflexion. The hemorrhoids were                            small. Complications:            No immediate complications. Estimated Blood Loss:     Estimated blood loss was minimal. Impression:               - One 5 mm polyp in the transverse colon, removed                            with a cold snare. Resected and retrieved.                           - Friability with no bleeding in the proximal                            rectum. Biopsied. Exclude sterocoral proctitis                           - Non-bleeding external and internal hemorrhoids. Recommendation:           - Patient has a contact number available for                            emergencies. The signs  and symptoms of potential                            delayed complications were discussed with the                            patient. Return to normal activities tomorrow.                            Written discharge instructions were provided to the                            patient.                           - Resume previous diet.                           - Continue present medications.                           - Await pathology results.                           - Repeat colonoscopy in 5 years for surveillance  due to family h/o colon cancer.                           - Return to GI clinic at the next available                            appointment.                           - Miralax 1 capful daily to prevent constipation Napoleon Form, MD 07/29/2022 2:54:34 PM This report has been signed electronically.

## 2022-07-29 NOTE — Progress Notes (Signed)
Called to room to assist during endoscopic procedure.  Patient ID and intended procedure confirmed with present staff. Received instructions for my participation in the procedure from the performing physician.  

## 2022-07-29 NOTE — Progress Notes (Signed)
Report to PACU, RN, vss, BBS= Clear.  

## 2022-07-29 NOTE — Patient Instructions (Addendum)
-   Resume previous diet. - Continue present medications. - Await pathology results. - Repeat colonoscopy in 5 years for surveillance. - Return to GI clinic at the next available appointment. - Miralax 1 capful daily to prevent constipation  YOU HAD AN ENDOSCOPIC PROCEDURE TODAY AT THE [facility]:   Refer to the procedure report that was given to you for any specific questions about what was found during the examination.  If the procedure report does not answer your questions, please call your gastroenterologist to clarify.  If you requested that your care partner not be given the details of your procedure findings, then the procedure report has been included in a sealed envelope for you to review at your convenience later.  YOU SHOULD EXPECT: Some feelings of bloating in the abdomen. Passage of more gas than usual.  Walking can help get rid of the air that was put into your GI tract during the procedure and reduce the bloating. If you had a lower endoscopy (such as a colonoscopy or flexible sigmoidoscopy) you may notice spotting of blood in your stool or on the toilet paper. If you underwent a bowel prep for your procedure, you may not have a normal bowel movement for a few days.  Please Note:  You might notice some irritation and congestion in your nose or some drainage.  This is from the oxygen used during your procedure.  There is no need for concern and it should clear up in a day or so.  SYMPTOMS TO REPORT IMMEDIATELY:  Following lower endoscopy (colonoscopy or flexible sigmoidoscopy):  Excessive amounts of blood in the stool  Significant tenderness or worsening of abdominal pains  Swelling of the abdomen that is new, acute  Fever of 100F or higher  For urgent or emergent issues, a gastroenterologist can be reached at any hour by calling [phone]. Do not use MyChart messaging for urgent concerns.    DIET:  We do recommend a small meal at first, but then you may  proceed to your regular diet.  Drink plenty of fluids but you should avoid alcoholic beverages for 24 hours.  ACTIVITY:  You should plan to take it easy for the rest of today and you should NOT DRIVE or use heavy machinery until tomorrow (because of the sedation medicines used during the test).    FOLLOW UP: Our staff will call the number listed on your records the next business day following your procedure.  We will call around 7:15- 8:00 am to check on you and address any questions or concerns that you may have regarding the information given to you following your procedure. If we do not reach you, we will leave a message.     If any biopsies were taken you will be contacted by phone or by letter within the next 1-3 weeks.  Please call us at [phone] if you have not heard about the biopsies in 3 weeks.    SIGNATURES/CONFIDENTIALITY: You and/or your care partner have signed paperwork which will be entered into your electronic medical record.  These signatures attest to the fact that that the information above on your After Visit Summary has been reviewed and is understood.  Full responsibility of the confidentiality of this discharge information lies with you and/or your care-partner.

## 2022-07-29 NOTE — Progress Notes (Signed)
[facility] History and Physical   Primary Care Physician:  Dale Humboldt, MD   Reason for Procedure:  Iron deficiency anemia  Plan:    Colonoscopy with possible interventions as needed     HPI: Savannah Gomez is a very pleasant 67 y.o. female here for colonoscopy for iron deficiency anemia.  The risks and benefits as well as alternatives of endoscopic procedure(s) have been discussed and reviewed. All questions answered. The patient agrees to proceed.    Past Medical History:  Diagnosis Date   Allergy    Arthritis    Asthma    Chicken pox    Complication of anesthesia    GERD (gastroesophageal reflux disease)    History of kidney stones    Migraine headache    PONV (postoperative nausea and vomiting)    Urine incontinence     Past Surgical History:  Procedure Laterality Date   BREAST CYST ASPIRATION Bilateral    neg   carpal tunnel sugery     bilateral   CHOLECYSTECTOMY N/A 11/04/2019   Procedure: LAPAROSCOPIC CHOLECYSTECTOMY WITH INTRAOPERATIVE CHOLANGIOGRAM;  Surgeon: Earline Mayotte, MD;  Location: [facility] ORS;  Service: General;  Laterality: N/A;   COLONOSCOPY WITH PROPOFOL N/A 09/20/2018   Procedure: COLONOSCOPY WITH PROPOFOL;  Surgeon: Scot Jun, MD;  Location: [facility] ENDOSCOPY;  Service: Endoscopy;  Laterality: N/A;   ELBOW SURGERY  1999   ESOPHAGOGASTRODUODENOSCOPY (EGD) WITH PROPOFOL N/A 09/20/2018   Procedure: ESOPHAGOGASTRODUODENOSCOPY (EGD) WITH PROPOFOL;  Surgeon: Scot Jun, MD;  Location: [facility] ENDOSCOPY;  Service: Endoscopy;  Laterality: N/A;   HAND SURGERY  1999   broken finger - pins   NASAL SINUS SURGERY  1992   ROTATOR CUFF REPAIR  1994   Trigger finger surgery  1999 and 2000   VAGINAL HYSTERECTOMY  1987   secondary to fibroids    Prior to Admission medications   Medication Sig Start Date End Date Taking? Authorizing Provider  acetaminophen (TYLENOL) 500 MG tablet Take 1,000 mg by mouth daily as needed for mild  pain or headache.    Yes [provider]  busPIRone (BUSPAR) 5 MG tablet Take 1-2 tablets (5-10 mg total) by mouth 3 (three) times daily. 03/05/22  Yes Tressia Danas, MD  fluticasone (FLOVENT HFA) 110 MCG/ACT inhaler INHALE 2 PUFFS BY MOUTH INTO THE LUNGS TWICE A DAY 04/14/22  Yes Kasa, Wallis Bamberg, MD  imipramine (TOFRANIL) 25 MG tablet Take 1 tablet (25 mg total) by mouth 2 (two) times daily. 11/22/21  Yes Stoioff, Verna Czech, MD  pantoprazole (PROTONIX) 40 MG tablet Take 1 tablet (40 mg total) by mouth 2 (two) times daily before a meal. 06/02/22  Yes Dale [place], MD  sucralfate (CARAFATE) 1 GM/10ML suspension Take 10 mLs (1 g total) by mouth 4 (four) times daily -  with meals and at bedtime. 05/21/22  Yes Tressia Danas, MD  tolterodine (DETROL LA) 4 MG 24 hr capsule Take 1 capsule (4 mg total) by mouth daily. 11/22/21  Yes Stoioff, Verna Czech, MD  topiramate (TOPAMAX) 25 MG tablet Take 1 tablet (25 mg total) by mouth 2 (two) times daily. 06/02/22  Yes Dale , MD  albuterol (VENTOLIN HFA) 108 (90 Base) MCG/ACT inhaler Inhale 2 puffs into the lungs every 6 (six) hours as needed for wheezing or shortness of breath. 02/28/21   Erin Fulling, MD  dicyclomine (BENTYL) 10 MG capsule Take 1 capsule (10 mg total) by mouth 3 (three) times daily as needed for spasms (for abdominal pain and  cramping). 07/16/22   Esterwood, Amy S, PA-C  famotidine (PEPCID) 20 MG tablet TAKE 1 TABLET BY MOUTH TWICE A DAY 02/21/22   Tressia Danas, MD  hydrocortisone (ANUSOL-HC) 25 MG suppository Place 1 suppository (25 mg total) rectally every 12 (twelve) hours. 03/05/22   Tressia Danas, MD    Current Outpatient Medications  Medication Sig Dispense Refill   acetaminophen (TYLENOL) 500 MG tablet Take 1,000 mg by mouth daily as needed for mild pain or headache.      busPIRone (BUSPAR) 5 MG tablet Take 1-2 tablets (5-10 mg total) by mouth 3 (three) times daily. 180 tablet 3   fluticasone (FLOVENT HFA) 110 MCG/ACT  inhaler INHALE 2 PUFFS BY MOUTH INTO THE LUNGS TWICE A DAY 12 g 10   imipramine (TOFRANIL) 25 MG tablet Take 1 tablet (25 mg total) by mouth 2 (two) times daily. 180 tablet 3   pantoprazole (PROTONIX) 40 MG tablet Take 1 tablet (40 mg total) by mouth 2 (two) times daily before a meal. 180 tablet 3   sucralfate (CARAFATE) 1 GM/10ML suspension Take 10 mLs (1 g total) by mouth 4 (four) times daily -  with meals and at bedtime. 1200 mL 2   tolterodine (DETROL LA) 4 MG 24 hr capsule Take 1 capsule (4 mg total) by mouth daily. 90 capsule 3   topiramate (TOPAMAX) 25 MG tablet Take 1 tablet (25 mg total) by mouth 2 (two) times daily. 180 tablet 1   albuterol (VENTOLIN HFA) 108 (90 Base) MCG/ACT inhaler Inhale 2 puffs into the lungs every 6 (six) hours as needed for wheezing or shortness of breath. 1 each 10   dicyclomine (BENTYL) 10 MG capsule Take 1 capsule (10 mg total) by mouth 3 (three) times daily as needed for spasms (for abdominal pain and cramping). 90 capsule 6   famotidine (PEPCID) 20 MG tablet TAKE 1 TABLET BY MOUTH TWICE A DAY 60 tablet 11   hydrocortisone (ANUSOL-HC) 25 MG suppository Place 1 suppository (25 mg total) rectally every 12 (twelve) hours. 12 suppository 1   Current Facility-Administered Medications  Medication Dose Route Frequency Provider Last Rate Last Admin   0.9 %  sodium chloride infusion  500 mL Intravenous Once Napoleon Form, MD        Allergies as of 07/29/2022 - Review Complete 07/29/2022  Allergen Reaction Noted   Demerol [meperidine] Nausea Only 01/16/2012    Family History  Problem Relation Age of Onset   Colon cancer Mother        colon   Asthma Mother    Heart disease Father    Hypertension Father    Colon cancer Sister    Stroke Maternal Grandfather    Diabetes Maternal Grandfather    Lung cancer Maternal Grandmother    Breast cancer Maternal Grandmother    Esophageal cancer Neg Hx    Pancreatic cancer Neg Hx    Liver disease Neg Hx     Stomach cancer Neg Hx     Social History   Socioeconomic History   Marital status: Married    Spouse name: Not on file   Number of children: Not on file   Years of education: Not on file   Highest education level: Not on file  Occupational History   Not on file  Tobacco Use   Smoking status: Never   Smokeless tobacco: Never  Vaping Use   Vaping Use: Never used  Substance and Sexual Activity   Alcohol use: No   Drug use:  No   Sexual activity: Yes    Birth control/protection: None, Post-menopausal  Other Topics Concern   Not on file  Social History Narrative   Not on file   Social Determinants of Health   Financial Resource Strain: Low Risk  (09/16/2021)   Overall Financial Resource Strain (CARDIA)    Difficulty of Paying Living Expenses: Not hard at all  Food Insecurity: No Food Insecurity (09/16/2021)   Hunger Vital Sign    Worried About Running Out of Food in the Last Year: Never true    Ran Out of Food in the Last Year: Never true  Transportation Needs: No Transportation Needs (09/16/2021)   PRAPARE - Administrator, Civil Service (Medical): No    Lack of Transportation (Non-Medical): No  Physical Activity: Insufficiently Active (09/16/2021)   Exercise Vital Sign    Days of Exercise per Week: 7 days    Minutes of Exercise per Session: 20 min  Stress: No Stress Concern Present (09/16/2021)   [employer] of Occupational Health - Occupational Stress Questionnaire    Feeling of Stress : Not at all  Social Connections: Unknown (09/16/2021)   Social Connection and Isolation Panel [NHANES]    Frequency of Communication with Friends and Family: More than three times a week    Frequency of Social Gatherings with Friends and Family: More than three times a week    Attends Religious Services: Not on file    Active Member of Clubs or Organizations: Not on file    Attends Banker Meetings: Not on file    Marital Status: Not on file  Intimate  Partner Violence: Not At Risk (09/16/2021)   Humiliation, Afraid, Rape, and Kick questionnaire    Fear of Current or Ex-Partner: No    Emotionally Abused: No    Physically Abused: No    Sexually Abused: No    Review of Systems:  All other review of systems negative except as mentioned in the HPI.  Physical Exam: Vital signs in last 24 hours: Blood Pressure 138/72   Pulse 82   Temperature (Abnormal) 97.5 F (36.4 C)   Height 5\' 3"  (1.6 m)   Weight 191 lb (86.6 kg)   Oxygen Saturation 98%   Body Mass Index 33.83 kg/m  General:   Alert, NAD Lungs:  Clear .   Heart:  Regular rate and rhythm Abdomen:  Soft, nontender and nondistended. Neuro/Psych:  Alert and cooperative. Normal mood and affect. A and O x 3  Reviewed labs, radiology imaging, old records and pertinent past GI work up  Patient is appropriate for planned procedure(s) and anesthesia in an ambulatory setting   K. Scherry Ran , MD [phone]

## 2022-07-30 ENCOUNTER — Telehealth: Payer: Self-pay | Admitting: *Deleted

## 2022-07-30 NOTE — Telephone Encounter (Signed)
  Follow up Call-     07/29/2022   12:56 PM 05/01/2020    9:34 AM  Call back number  Post procedure Call Back phone  # [phone]  Permission to leave phone message Yes Yes     Patient questions:  Do you have a fever, pain , or abdominal swelling? No. Pain Score  0 *  Have you tolerated food without any problems? Yes.    Have you been able to return to your normal activities? Yes.    Do you have any questions about your discharge instructions: Diet   No. Medications  No. Follow up visit  No.  Do you have questions or concerns about your Care? No.  Actions: * If pain score is 4 or above: No action needed, pain <4.

## 2022-07-31 ENCOUNTER — Ambulatory Visit (INDEPENDENT_AMBULATORY_CARE_PROVIDER_SITE_OTHER): Payer: Medicare HMO

## 2022-07-31 VITALS — Wt 191.0 lb

## 2022-07-31 DIAGNOSIS — Z0000 Encounter for general adult medical examination without abnormal findings: Secondary | ICD-10-CM

## 2022-07-31 NOTE — Patient Instructions (Addendum)
Savannah Gomez , Thank you for taking time to come for your Medicare Wellness Visit. I appreciate your ongoing commitment to your health goals. Please review the following plan we discussed and let me know if I can assist you in the future.   These are the goals we discussed:  Goals      Maintain healthy lifestyle     Stay active Healthy diet/stay hydrated        This is a list of the screening recommended for you and due dates:  Health Maintenance  Topic Date Due   DTaP/Tdap/Td vaccine (1 - Tdap) Never done   COVID-19 Vaccine (5 - 2023-24 season) 08/16/2022*   Flu Shot  10/23/2022   Mammogram  12/03/2022   Medicare Annual Wellness Visit  07/31/2023   Colon Cancer Screening  07/28/2032   Pneumonia Vaccine  Completed   DEXA scan (bone density measurement)  Completed   Hepatitis C Screening: USPSTF Recommendation to screen - Ages 5-79 yo.  Completed   Zoster (Shingles) Vaccine  Completed   HPV Vaccine  Aged Out  *Topic was postponed. The date shown is not the original due date.   Advanced directives: Please bring a copy of your health care power of attorney and living will to the office to be added to your chart at your convenience.   Conditions/risks identified: Aim for 30 minutes of exercise or brisk walking, 6-8 glasses of water, and 5 servings of fruits and vegetables each day.   Next appointment: Follow up in one year for your annual wellness visit    Preventive Care 65 Years and Older, Female Preventive care refers to lifestyle choices and visits with your health care provider that can promote health and wellness. What does preventive care include? A yearly physical exam. This is also called an annual well check. Dental exams once or twice a year. Routine eye exams. Ask your health care provider how often you should have your eyes checked. Personal lifestyle choices, including: Daily care of your teeth and gums. Regular physical activity. Eating a healthy  diet. Avoiding tobacco and drug use. Limiting alcohol use. Practicing safe sex. Taking low-dose aspirin every day. Taking vitamin and mineral supplements as recommended by your health care provider. What happens during an annual well check? The services and screenings done by your health care provider during your annual well check will depend on your age, overall health, lifestyle risk factors, and family history of disease. Counseling  Your health care provider may ask you questions about your: Alcohol use. Tobacco use. Drug use. Emotional well-being. Home and relationship well-being. Sexual activity. Eating habits. History of falls. Memory and ability to understand (cognition). Work and work Astronomer. Reproductive health. Screening  You may have the following tests or measurements: Height, weight, and BMI. Blood pressure. Lipid and cholesterol levels. These may be checked every 5 years, or more frequently if you are over 41 years old. Skin check. Lung cancer screening. You may have this screening every year starting at age 5 if you have a 30-pack-year history of smoking and currently smoke or have quit within the past 15 years. Fecal occult blood test (FOBT) of the stool. You may have this test every year starting at age 48. Flexible sigmoidoscopy or colonoscopy. You may have a sigmoidoscopy every 5 years or a colonoscopy every 10 years starting at age 20. Hepatitis C blood test. Hepatitis B blood test. Sexually transmitted disease (STD) testing. Diabetes screening. This is done by checking your blood sugar (  glucose) after you have not eaten for a while (fasting). You may have this done every 1-3 years. Bone density scan. This is done to screen for osteoporosis. You may have this done starting at age 47. Mammogram. This may be done every 1-2 years. Talk to your health care provider about how often you should have regular mammograms. Talk with your health care provider about  your test results, treatment options, and if necessary, the need for more tests. Vaccines  Your health care provider may recommend certain vaccines, such as: Influenza vaccine. This is recommended every year. Tetanus, diphtheria, and acellular pertussis (Tdap, Td) vaccine. You may need a Td booster every 10 years. Zoster vaccine. You may need this after age 46. Pneumococcal 13-valent conjugate (PCV13) vaccine. One dose is recommended after age 44. Pneumococcal polysaccharide (PPSV23) vaccine. One dose is recommended after age 43. Talk to your health care provider about which screenings and vaccines you need and how often you need them. This information is not intended to replace advice given to you by your health care provider. Make sure you discuss any questions you have with your health care provider. Document Released: 04/06/2015 Document Revised: 11/28/2015 Document Reviewed: 01/09/2015 Elsevier Interactive Patient Education  2017 [place] Prevention in the Home Falls can cause injuries. They can happen to people of all ages. There are many things you can do to make your home safe and to help prevent falls. What can I do on the outside of my home? Regularly fix the edges of walkways and driveways and fix any cracks. Remove anything that might make you trip as you walk through a door, such as a raised step or threshold. Trim any bushes or trees on the path to your home. Use bright outdoor lighting. Clear any walking paths of anything that might make someone trip, such as rocks or tools. Regularly check to see if handrails are loose or broken. Make sure that both sides of any steps have handrails. Any raised decks and porches should have guardrails on the edges. Have any leaves, snow, or ice cleared regularly. Use sand or salt on walking paths during winter. Clean up any spills in your garage right away. This includes oil or grease spills. What can I do in the bathroom? Use  night lights. Install grab bars by the toilet and in the tub and shower. Do not use towel bars as grab bars. Use non-skid mats or decals in the tub or shower. If you need to sit down in the shower, use a plastic, non-slip stool. Keep the floor dry. Clean up any water that spills on the floor as soon as it happens. Remove soap buildup in the tub or shower regularly. Attach bath mats securely with double-sided non-slip rug tape. Do not have throw rugs and other things on the floor that can make you trip. What can I do in the bedroom? Use night lights. Make sure that you have a light by your bed that is easy to reach. Do not use any sheets or blankets that are too big for your bed. They should not hang down onto the floor. Have a firm chair that has side arms. You can use this for support while you get dressed. Do not have throw rugs and other things on the floor that can make you trip. What can I do in the kitchen? Clean up any spills right away. Avoid walking on wet floors. Keep items that you use a lot in easy-to-reach places.  If you need to reach something above you, use a strong step stool that has a grab bar. Keep electrical cords out of the way. Do not use floor polish or wax that makes floors slippery. If you must use wax, use non-skid floor wax. Do not have throw rugs and other things on the floor that can make you trip. What can I do with my stairs? Do not leave any items on the stairs. Make sure that there are handrails on both sides of the stairs and use them. Fix handrails that are broken or loose. Make sure that handrails are as long as the stairways. Check any carpeting to make sure that it is firmly attached to the stairs. Fix any carpet that is loose or worn. Avoid having throw rugs at the top or bottom of the stairs. If you do have throw rugs, attach them to the floor with carpet tape. Make sure that you have a light switch at the top of the stairs and the bottom of the  stairs. If you do not have them, ask someone to add them for you. What else can I do to help prevent falls? Wear shoes that: Do not have high heels. Have rubber bottoms. Are comfortable and fit you well. Are closed at the toe. Do not wear sandals. If you use a stepladder: Make sure that it is fully opened. Do not climb a closed stepladder. Make sure that both sides of the stepladder are locked into place. Ask someone to hold it for you, if possible. Clearly mark and make sure that you can see: Any grab bars or handrails. First and last steps. Where the edge of each step is. Use tools that help you move around (mobility aids) if they are needed. These include: Canes. Walkers. Scooters. Crutches. Turn on the lights when you go into a dark area. Replace any light bulbs as soon as they burn out. Set up your furniture so you have a clear path. Avoid moving your furniture around. If any of your floors are uneven, fix them. If there are any pets around you, be aware of where they are. Review your medicines with your doctor. Some medicines can make you feel dizzy. This can increase your chance of falling. Ask your doctor what other things that you can do to help prevent falls. This information is not intended to replace advice given to you by your health care provider. Make sure you discuss any questions you have with your health care provider. Document Released: 01/04/2009 Document Revised: 08/16/2015 Document Reviewed: 04/14/2014 Elsevier Interactive Patient Education  2017 [employer].

## 2022-07-31 NOTE — Progress Notes (Signed)
Subjective:   Savannah Gomez is a 67 y.o. female who presents for Medicare Annual (Subsequent) preventive examination.  Review of Systems    I connected with  Savannah Gomez on 07/31/22 by a audio enabled telemedicine application and verified that I am speaking with the correct person using two identifiers. Patient Medicare AWV questionnaire was completed by the patient on 07/27/22; I have confirmed that all information answered by patient is correct and no changes since this date.     Patient Location: Home  Provider Location: Home Office  I discussed the limitations of evaluation and management by telemedicine. The patient expressed understanding and agreed to proceed.  Cardiac Risk Factors include: advanced age (>38men, >38 women);Other (see comment), Risk factor comments: asthma     Objective:    Today's Vitals   07/31/22 1557  Weight: 191 lb (86.6 kg)   Body mass index is 33.83 kg/m.     07/31/2022    4:03 PM 09/16/2021   12:43 PM 09/14/2020    8:27 AM 11/04/2019   10:22 AM 10/26/2019    9:05 AM 09/20/2018    8:25 AM 09/08/2017    1:04 PM  Advanced Directives  Does Patient Have a Medical Advance Directive? Yes Yes Yes Yes Yes Yes Yes  Type of Estate agent of [place];Living will Healthcare Power of [place];Living will Healthcare Power of [place];Living will Healthcare Power of [place];Living will Healthcare Power of [place];Living will Healthcare Power of [place];Living will Healthcare Power of [place];Living will  Does patient want to make changes to medical advance directive?  No - Patient declined No - Patient declined No - Patient declined     Copy of Healthcare Power of Attorney in Chart? No - copy requested No - copy requested No - copy requested Yes - validated most recent copy scanned in chart (See row information)  No - copy requested No - copy requested    Current Medications (verified) Outpatient Encounter Medications as of 07/31/2022   Medication Sig   acetaminophen (TYLENOL) 500 MG tablet Take 1,000 mg by mouth daily as needed for mild pain or headache.    albuterol (VENTOLIN HFA) 108 (90 Base) MCG/ACT inhaler Inhale 2 puffs into the lungs every 6 (six) hours as needed for wheezing or shortness of breath.   busPIRone (BUSPAR) 5 MG tablet Take 1-2 tablets (5-10 mg total) by mouth 3 (three) times daily.   dicyclomine (BENTYL) 10 MG capsule Take 1 capsule (10 mg total) by mouth 3 (three) times daily as needed for spasms (for abdominal pain and cramping).   famotidine (PEPCID) 20 MG tablet TAKE 1 TABLET BY MOUTH TWICE A DAY   fluticasone (FLOVENT HFA) 110 MCG/ACT inhaler INHALE 2 PUFFS BY MOUTH INTO THE LUNGS TWICE A DAY   hydrocortisone (ANUSOL-HC) 25 MG suppository Place 1 suppository (25 mg total) rectally every 12 (twelve) hours.   imipramine (TOFRANIL) 25 MG tablet Take 1 tablet (25 mg total) by mouth 2 (two) times daily.   pantoprazole (PROTONIX) 40 MG tablet Take 1 tablet (40 mg total) by mouth 2 (two) times daily before a meal.   sucralfate (CARAFATE) 1 GM/10ML suspension Take 10 mLs (1 g total) by mouth 4 (four) times daily -  with meals and at bedtime.   tolterodine (DETROL LA) 4 MG 24 hr capsule Take 1 capsule (4 mg total) by mouth daily.   topiramate (TOPAMAX) 25 MG tablet Take 1 tablet (25 mg total) by mouth 2 (two) times daily.  No facility-administered encounter medications on file as of 07/31/2022.    Allergies (verified) Demerol [meperidine]   History: Past Medical History:  Diagnosis Date   Allergy    Arthritis    Asthma    Chicken pox    Complication of anesthesia    GERD (gastroesophageal reflux disease)    History of kidney stones    Migraine headache    PONV (postoperative nausea and vomiting)    Urine incontinence    Past Surgical History:  Procedure Laterality Date   BREAST CYST ASPIRATION Bilateral    neg   carpal tunnel sugery     bilateral   CHOLECYSTECTOMY N/A 11/04/2019    Procedure: LAPAROSCOPIC CHOLECYSTECTOMY WITH INTRAOPERATIVE CHOLANGIOGRAM;  Surgeon: Earline Mayotte, MD;  Location: [facility] ORS;  Service: General;  Laterality: N/A;   COLONOSCOPY WITH PROPOFOL N/A 09/20/2018   Procedure: COLONOSCOPY WITH PROPOFOL;  Surgeon: Scot Jun, MD;  Location: [facility] ENDOSCOPY;  Service: Endoscopy;  Laterality: N/A;   ELBOW SURGERY  1999   ESOPHAGOGASTRODUODENOSCOPY (EGD) WITH PROPOFOL N/A 09/20/2018   Procedure: ESOPHAGOGASTRODUODENOSCOPY (EGD) WITH PROPOFOL;  Surgeon: Scot Jun, MD;  Location: [facility] ENDOSCOPY;  Service: Endoscopy;  Laterality: N/A;   HAND SURGERY  1999   broken finger - pins   NASAL SINUS SURGERY  1992   ROTATOR CUFF REPAIR  1994   Trigger finger surgery  1999 and 2000   VAGINAL HYSTERECTOMY  1987   secondary to fibroids   Family History  Problem Relation Age of Onset   Colon cancer Mother        colon   Asthma Mother    Heart disease Father    Hypertension Father    Colon cancer Sister    Stroke Maternal Grandfather    Diabetes Maternal Grandfather    Lung cancer Maternal Grandmother    Breast cancer Maternal Grandmother    Esophageal cancer Neg Hx    Pancreatic cancer Neg Hx    Liver disease Neg Hx    Stomach cancer Neg Hx    Social History   Socioeconomic History   Marital status: Married    Spouse name: Not on file   Number of children: Not on file   Years of education: Not on file   Highest education level: Not on file  Occupational History   Not on file  Tobacco Use   Smoking status: Never   Smokeless tobacco: Never  Vaping Use   Vaping Use: Never used  Substance and Sexual Activity   Alcohol use: No   Drug use: No   Sexual activity: Yes    Birth control/protection: None, Post-menopausal  Other Topics Concern   Not on file  Social History Narrative   Not on file   Social Determinants of Health   Financial Resource Strain: Low Risk  (07/31/2022)   Overall Financial Resource Strain (CARDIA)     Difficulty of Paying Living Expenses: Not hard at all  Food Insecurity: No Food Insecurity (07/31/2022)   Hunger Vital Sign    Worried About Running Out of Food in the Last Year: Never true    Ran Out of Food in the Last Year: Never true  Transportation Needs: No Transportation Needs (07/31/2022)   PRAPARE - Administrator, Civil Service (Medical): No    Lack of Transportation (Non-Medical): No  Physical Activity: Sufficiently Active (07/31/2022)   Exercise Vital Sign    Days of Exercise per Week: 7 days    Minutes of Exercise  per Session: 30 min  Stress: No Stress Concern Present (07/31/2022)   [employer] of Occupational Health - Occupational Stress Questionnaire    Feeling of Stress : Not at all  Social Connections: Socially Integrated (07/31/2022)   Social Connection and Isolation Panel [NHANES]    Frequency of Communication with Friends and Family: More than three times a week    Frequency of Social Gatherings with Friends and Family: More than three times a week    Attends Religious Services: More than 4 times per year    Active Member of [employer] or Organizations: Yes    Attends Engineer, structural: More than 4 times per year    Marital Status: Married    Tobacco Counseling Counseling given: Not Answered   Clinical Intake:  Pre-visit preparation completed: Yes  Pain : No/denies pain     BMI - recorded: 33.83 Nutritional Status: BMI > 30  Obese Nutritional Risks: None Diabetes: No  How often do you need to have someone help you when you read instructions, pamphlets, or other written materials from your doctor or pharmacy?: 1 - Never  Diabetic?NO  Interpreter Needed?: No  Information entered by :: Fredirick Maudlin   Activities of Daily Living    07/27/2022    1:24 PM 09/16/2021   12:45 PM  In your present state of health, do you have any difficulty performing the following activities:  Hearing? 0 0  Vision? 0 0  Difficulty concentrating  or making decisions? 0 0  Walking or climbing stairs? 0 0  Dressing or bathing? 0 0  Doing errands, shopping? 0 0  Preparing Food and eating ? N N  Using the Toilet? N N  In the past six months, have you accidently leaked urine? N N  Do you have problems with loss of bowel control? N N  Managing your Medications? N N  Managing your Finances? N N  Housekeeping or managing your Housekeeping? N N    Patient Care Team: Dale [place], MD as PCP - General (Internal Medicine)  Indicate any recent Medical Services you may have received from other than [facility] providers in the past year (date may be approximate).     Assessment:   This is a routine wellness examination for [place].  Hearing/Vision screen Hearing Screening - Comments:: Denies hearing difficulties   Vision Screening - Comments:: Wears rx glasses - up to date with routine eye exams with  Dr Clydene Pugh   Dietary issues and exercise activities discussed: Current Exercise Habits: Home exercise routine, Type of exercise: walking, Time (Minutes): 30, Frequency (Times/Week): 3, Weekly Exercise (Minutes/Week): 90, Exercise limited by: None identified   Goals Addressed             This Visit's Progress    Maintain healthy lifestyle   On track    Stay active Healthy diet/stay hydrated      Depression Screen    07/31/2022    4:01 PM 01/31/2022    7:06 AM 09/23/2021   10:03 AM 09/16/2021   12:38 PM 01/22/2021    8:36 AM 09/14/2020    8:31 AM 05/21/2020    1:38 PM  PHQ 2/9 Scores  PHQ - 2 Score 0 0 0 0 0 0 0    Fall Risk    07/27/2022    1:24 PM 01/31/2022    7:06 AM 09/23/2021   10:03 AM 09/16/2021   12:45 PM 01/22/2021    8:36 AM  Fall Risk   Falls in the  past year? 0 0 0 0 0  Number falls in past yr: 0 0 0  0  Injury with Fall? 0 0 0  0  Risk for fall due to : No Fall Risks No Fall Risks No Fall Risks  No Fall Risks  Follow up Falls prevention discussed;Falls evaluation completed Falls evaluation completed Falls  evaluation completed Falls evaluation completed Falls evaluation completed    FALL RISK PREVENTION PERTAINING TO THE HOME:  Any stairs in or around the home? No  If so, are there any without handrails? No  Home free of loose throw rugs in walkways, pet beds, electrical cords, etc? No  Adequate lighting in your home to reduce risk of falls? Yes   ASSISTIVE DEVICES UTILIZED TO PREVENT FALLS:  Life alert? No  Use of a cane, walker or w/c? No  Grab bars in the bathroom? No  Shower chair or bench in shower? No  Elevated toilet seat or a handicapped toilet? No   TIMED UP AND GO:  Was the test performed? No .  Cognitive Function:        07/31/2022    4:01 PM  6CIT Screen  What Year? 0 points  What month? 0 points  What time? 0 points  Count back from 20 0 points  Months in reverse 0 points  Repeat phrase 0 points  Total Score 0 points    Immunizations Immunization History  Administered Date(s) Administered   Fluad Quad(high Dose 65+) 01/17/2020, 01/22/2021, 01/31/2022   Influenza Split 12/18/2015   Influenza,inj,Quad PF,6+ Mos 01/08/2017, 01/01/2018   Influenza-Unspecified 12/17/2012, 01/05/2014, 01/08/2015, 01/01/2018, 12/23/2018   PFIZER(Purple Top)SARS-COV-2 Vaccination 07/14/2019, 08/09/2019, 04/19/2020   Pfizer Covid-19 Vaccine Bivalent Booster 58yrs & up 12/17/2020   Pneumococcal Conjugate-13 09/19/2019   Pneumococcal Polysaccharide-23 08/19/2016, 09/23/2021   Zoster Recombinat (Shingrix) 02/09/2019, 05/10/2019    TDAP status: Due, Education has been provided regarding the importance of this vaccine. Advised may receive this vaccine at local pharmacy or Health Dept. Aware to provide a copy of the vaccination record if obtained from local pharmacy or Health Dept. Verbalized acceptance and understanding.  Flu Vaccine status: Up to date  Pneumococcal vaccine status: Up to date  Covid-19 vaccine status: Declined, Education has been provided regarding the importance  of this vaccine but patient still declined. Advised may receive this vaccine at local pharmacy or Health Dept.or vaccine clinic. Aware to provide a copy of the vaccination record if obtained from local pharmacy or Health Dept. Verbalized acceptance and understanding.  Qualifies for Shingles Vaccine? Yes   Zostavax completed Yes   Shingrix Completed?: Yes  Screening Tests Health Maintenance  Topic Date Due   DTaP/Tdap/Td (1 - Tdap) Never done   COVID-19 Vaccine (5 - 2023-24 season) 08/16/2022 (Originally 11/22/2021)   INFLUENZA VACCINE  10/23/2022   MAMMOGRAM  12/03/2022   Medicare Annual Wellness (AWV)  07/31/2023   COLONOSCOPY (Pts 45-58yrs Insurance coverage will need to be confirmed)  07/28/2032   Pneumonia Vaccine 17+ Years old  Completed   DEXA SCAN  Completed   Hepatitis C Screening  Completed   Zoster Vaccines- Shingrix  Completed   HPV VACCINES  Aged Out    Health Maintenance  Health Maintenance Due  Topic Date Due   DTaP/Tdap/Td (1 - Tdap) Never done    Colorectal cancer screening: Type of screening: Colonoscopy. Completed 07/29/22. Repeat every 5 years  Mammogram status: Completed 12/02/21. Repeat every year  Bone Density status: Completed 09/01/2016. Results reflect: Bone density results: OSTEOPOROSIS.  Repeat every 10 years.  Lung Cancer Screening: (Low Dose CT Chest recommended if Age 68-80 years, 30 pack-year currently smoking OR have quit w/in 15years.) does not qualify.     Additional Screening:  Hepatitis C Screening: does qualify; Completed 09/14/20  Vision Screening: Recommended annual ophthalmology exams for early detection of glaucoma and other disorders of the eye. Is the patient up to date with their annual eye exam?  Yes  Who is the provider or what is the name of the office in which the patient attends annual eye exams? Dr. Clydene Pugh If pt is not established with a provider, would they like to be referred to a provider to establish care? No .    Dental Screening: Recommended annual dental exams for proper oral hygiene  Community Resource Referral / Chronic Care Management: CRR required this visit?  No   CCM required this visit?  No      Plan:     I have personally reviewed and noted the following in the patient's chart:   Medical and social history Use of alcohol, tobacco or illicit drugs  Current medications and supplements including opioid prescriptions. Patient is not currently taking opioid prescriptions. Functional ability and status Nutritional status Physical activity Advanced directives List of other physicians Hospitalizations, surgeries, and ER visits in previous 12 months Vitals Screenings to include cognitive, depression, and falls Referrals and appointments  In addition, I have reviewed and discussed with patient certain preventive protocols, quality metrics, and best practice recommendations. A written personalized care plan for preventive services as well as general preventive health recommendations were provided to patient.     Annabell Sabal, CMA   07/31/2022   Nurse Notes: none

## 2022-08-06 ENCOUNTER — Encounter: Payer: Self-pay | Admitting: Gastroenterology

## 2022-09-18 ENCOUNTER — Telehealth: Payer: Self-pay | Admitting: Internal Medicine

## 2022-09-18 NOTE — Telephone Encounter (Signed)
Spoke to [place] and she sent over med change req for the Flovent due to it being discontinued. Pls consider a alternative

## 2022-09-18 NOTE — Telephone Encounter (Signed)
Spoke to Savannah Gomez and she is sent over med change req for the Flovent due to it being discontinued. Pls consider a alternative

## 2022-09-19 ENCOUNTER — Telehealth: Payer: Self-pay | Admitting: Internal Medicine

## 2022-09-19 DIAGNOSIS — E7800 Pure hypercholesterolemia, unspecified: Secondary | ICD-10-CM

## 2022-09-19 DIAGNOSIS — E611 Iron deficiency: Secondary | ICD-10-CM

## 2022-09-19 NOTE — Telephone Encounter (Signed)
Patient need orders  °

## 2022-09-22 NOTE — Telephone Encounter (Signed)
Do we know what is on her formulary ?

## 2022-09-22 NOTE — Telephone Encounter (Signed)
Savannah Gomez checking on message sent for medication change. Savannah Gomez phone number is [phone].

## 2022-09-22 NOTE — Telephone Encounter (Signed)
Spoke to [employer] [facility] She is requesting alternative to Flovent. Flovent has been discontinued.  Tammy please advise. Dr. Belia Heman is unavailable. Thanks

## 2022-09-23 NOTE — Telephone Encounter (Signed)
Labs ordered.

## 2022-09-23 NOTE — Telephone Encounter (Signed)
I put fasting labs in for her. We had to credit the ones that were drawn 4/30. Just wanted to confirm with you about rechecking a CBC and iron studies with her labs next week. Orders are pended.

## 2022-09-26 ENCOUNTER — Other Ambulatory Visit (HOSPITAL_COMMUNITY): Payer: Self-pay

## 2022-09-26 NOTE — Telephone Encounter (Signed)
Bryson Dames, CPhT  to [facility] Triage Pool      09/26/22  3:24 PM  We do not have access to patient formulary. I ran a test claim and it gives alternative of Arnuity Ellipta or coverage alternative. If you would like more information about what is covered, the office or patient can reach out to the patients insurance for this information.

## 2022-09-29 MED ORDER — ARNUITY ELLIPTA 100 MCG/ACT IN AEPB: 1.0000 | INHALATION_SPRAY | Freq: Every day | RESPIRATORY_TRACT | 5 refills | Status: AC

## 2022-09-29 NOTE — Telephone Encounter (Signed)
Lm for patient.  

## 2022-09-29 NOTE — Telephone Encounter (Signed)
Spoke to patient. She is aware of change in medication and usage. She voiced her understanding.  Nothing further needed.

## 2022-09-29 NOTE — Telephone Encounter (Signed)
Can change to Arnuity 100 1 puff daily , rinse after use. Rx sent to pharmacy

## 2022-09-30 ENCOUNTER — Other Ambulatory Visit (INDEPENDENT_AMBULATORY_CARE_PROVIDER_SITE_OTHER): Payer: Medicare HMO

## 2022-09-30 DIAGNOSIS — E611 Iron deficiency: Secondary | ICD-10-CM | POA: Diagnosis not present

## 2022-09-30 DIAGNOSIS — E7800 Pure hypercholesterolemia, unspecified: Secondary | ICD-10-CM | POA: Diagnosis not present

## 2022-09-30 LAB — BASIC METABOLIC PANEL
BUN: 11 mg/dL (ref 6–23)
CO2: 27 mEq/L (ref 19–32)
Calcium: 9.3 mg/dL (ref 8.4–10.5)
Chloride: 106 mEq/L (ref 96–112)
Creatinine, Ser: 0.98 mg/dL (ref 0.40–1.20)
GFR: 59.44 mL/min — ABNORMAL LOW (ref >60.00)
Glucose, Bld: 92 mg/dL (ref 70–99)
Potassium: 3.7 mEq/L (ref 3.5–5.1)
Sodium: 140 mEq/L (ref 135–145)

## 2022-09-30 LAB — LIPID PANEL
Cholesterol: 197 mg/dL (ref 0–200)
HDL: 77.4 mg/dL (ref >39.00)
LDL Cholesterol: 104 mg/dL — ABNORMAL HIGH (ref 0–99)
NonHDL: 119.61
Total CHOL/HDL Ratio: 3
Triglycerides: 80 mg/dL (ref 0.0–149.0)
VLDL: 16 mg/dL (ref 0.0–40.0)

## 2022-09-30 LAB — HEPATIC FUNCTION PANEL
ALT: 26 U/L (ref 0–35)
AST: 21 U/L (ref 0–37)
Albumin: 4.1 g/dL (ref 3.5–5.2)
Alkaline Phosphatase: 157 U/L — ABNORMAL HIGH (ref 39–117)
Bilirubin, Direct: 0.2 mg/dL (ref 0.0–0.3)
Total Bilirubin: 0.7 mg/dL (ref 0.2–1.2)
Total Protein: 6.6 g/dL (ref 6.0–8.3)

## 2022-09-30 LAB — IBC + FERRITIN
Ferritin: 13.6 ng/mL (ref 10.0–291.0)
Iron: 73 ug/dL (ref 42–145)
Saturation Ratios: 20.9 % (ref 20.0–50.0)
TIBC: 348.6 ug/dL (ref 250.0–450.0)
Transferrin: 249 mg/dL (ref 212.0–360.0)

## 2022-09-30 LAB — CBC WITH DIFFERENTIAL/PLATELET
Basophils Absolute: 0.1 10*3/uL (ref 0.0–0.1)
Basophils Relative: 0.9 % (ref 0.0–3.0)
Eosinophils Absolute: 0.4 10*3/uL (ref 0.0–0.7)
Eosinophils Relative: 6.4 % — ABNORMAL HIGH (ref 0.0–5.0)
HCT: 41.6 % (ref 36.0–46.0)
Hemoglobin: 13.6 g/dL (ref 12.0–15.0)
Lymphocytes Relative: 27.3 % (ref 12.0–46.0)
Lymphs Abs: 1.6 10*3/uL (ref 0.7–4.0)
MCHC: 32.6 g/dL (ref 30.0–36.0)
MCV: 85.7 fl (ref 78.0–100.0)
Monocytes Absolute: 0.4 10*3/uL (ref 0.1–1.0)
Monocytes Relative: 6.2 % (ref 3.0–12.0)
Neutro Abs: 3.5 10*3/uL (ref 1.4–7.7)
Neutrophils Relative %: 59.2 % (ref 43.0–77.0)
Platelets: 248 10*3/uL (ref 150.0–400.0)
RBC: 4.85 Mil/uL (ref 3.87–5.11)
RDW: 15 % (ref 11.5–15.5)
WBC: 5.9 10*3/uL (ref 4.0–10.5)

## 2022-10-02 ENCOUNTER — Encounter: Payer: Self-pay | Admitting: Internal Medicine

## 2022-10-02 ENCOUNTER — Ambulatory Visit (INDEPENDENT_AMBULATORY_CARE_PROVIDER_SITE_OTHER): Payer: Medicare HMO | Admitting: Internal Medicine

## 2022-10-02 VITALS — BP 128/70 | HR 74 | Temp 97.9°F | Resp 16 | Ht 63.0 in | Wt 191.8 lb

## 2022-10-02 DIAGNOSIS — R1084 Generalized abdominal pain: Secondary | ICD-10-CM

## 2022-10-02 DIAGNOSIS — R748 Abnormal levels of other serum enzymes: Secondary | ICD-10-CM | POA: Diagnosis not present

## 2022-10-02 DIAGNOSIS — N3010 Interstitial cystitis (chronic) without hematuria: Secondary | ICD-10-CM

## 2022-10-02 DIAGNOSIS — K219 Gastro-esophageal reflux disease without esophagitis: Secondary | ICD-10-CM | POA: Diagnosis not present

## 2022-10-02 DIAGNOSIS — E7800 Pure hypercholesterolemia, unspecified: Secondary | ICD-10-CM | POA: Diagnosis not present

## 2022-10-02 DIAGNOSIS — J4530 Mild persistent asthma, uncomplicated: Secondary | ICD-10-CM

## 2022-10-02 DIAGNOSIS — Z1231 Encounter for screening mammogram for malignant neoplasm of breast: Secondary | ICD-10-CM | POA: Diagnosis not present

## 2022-10-02 DIAGNOSIS — Z0000 Encounter for general adult medical examination without abnormal findings: Secondary | ICD-10-CM

## 2022-10-02 MED ORDER — TOPIRAMATE 25 MG PO TABS: 25.0000 mg | ORAL_TABLET | Freq: Two times a day (BID) | ORAL | 1 refills | Status: DC

## 2022-10-02 NOTE — Progress Notes (Signed)
Subjective:    Patient ID: Savannah Gomez, female    DOB: 14-Jun-1955, 67 y.o.   MRN: [id number]  Patient here for  Chief Complaint  Patient presents with   Annual Exam    HPI Here for a physical exam.  Has been seeing GI for continued issues with nausea and abdominal pain. Has had extensive GI w/up as outlined in Dr Orvan Falconer note 06/06/22.  Had f/u with her 05/2022 - recommended to continue protonix, pepcid and carafate.  Recommended miralax, mag oxide and diet to help with constipation.  Continue buspar 10mg  prior to meals.  Recommended capsule endoscopy - negative 06/24/22. Had f/u with GI 07/16/22 - treated for diverticulitis.  Instructed on regular scheduled miralax.  Recommended colonoscopy. Colonoscopy 07/2022 - one 5 mm polyp in the transverse colon, friability with no bleeding in the proximal rectum and non bleeding external and internal hemorrhoids. She is off carafate.  Taking miralax daily to keep bowels continued issues with nausea and abdominal pain. Has had extensive GI w/up as outlined in Dr Orvan Falconer note 06/06/22.  Had f/u with her 05/2022 - recommended to continue protonix, pepcid and carafate.  Recommended miralax, mag oxide and diet to help with constipation.  Continue buspar 10mg  prior to meals.  Recommended capsule endoscopy - negative 06/24/22. Had f/u with GI 07/16/22 - treated for diverticulitis.  Instructed on regular scheduled miralax.  Recommended colonoscopy. Colonoscopy 07/2022 - one 5 mm polyp in the transverse colon, friability with no bleeding in the proximal rectum and non bleeding external and internal hemorrhoids. She is off carafate.  Taking miralax daily to keep bowels moving. No chest pain or sob reported.  No cough or congestion.     Past Medical History:  Diagnosis Date   Allergy    Arthritis    Asthma    Chicken pox    Complication of anesthesia    GERD (gastroesophageal reflux disease)    History of kidney stones    Migraine headache    PONV (postoperative  nausea and vomiting)    Urine incontinence    Past Surgical History:  Procedure Laterality Date   BREAST CYST ASPIRATION Bilateral    neg   carpal tunnel sugery     bilateral   CHOLECYSTECTOMY N/A 11/04/2019   Procedure: LAPAROSCOPIC CHOLECYSTECTOMY WITH INTRAOPERATIVE CHOLANGIOGRAM;  Surgeon: Earline Mayotte, MD;  Location: [facility] ORS;  Service: General;  Laterality: N/A;   COLONOSCOPY WITH PROPOFOL N/A 09/20/2018   Procedure: COLONOSCOPY WITH PROPOFOL;  Surgeon: Scot Jun, MD;  Location: [facility] ENDOSCOPY;  Service: Endoscopy;  Laterality: N/A;   ELBOW SURGERY  1999   ESOPHAGOGASTRODUODENOSCOPY (EGD) WITH PROPOFOL N/A 09/20/2018   Procedure: ESOPHAGOGASTRODUODENOSCOPY (EGD) WITH PROPOFOL;  Surgeon: Scot Jun, MD;  Location: [facility] ENDOSCOPY;  Service: Endoscopy;  Laterality: N/A;   HAND SURGERY  1999   broken finger - pins   NASAL SINUS SURGERY  1992   ROTATOR CUFF REPAIR  1994   Trigger finger surgery  1999 and 2000   VAGINAL HYSTERECTOMY  1987   secondary to fibroids   Family History  Problem Relation Age of Onset   Colon cancer Mother        colon   Asthma Mother    Heart disease Father    Hypertension Father    Colon cancer Sister    Stroke Maternal Grandfather    Diabetes Maternal Grandfather    Lung cancer Maternal Grandmother    Breast cancer Maternal Grandmother    Esophageal  cancer Neg Hx    Pancreatic cancer Neg Hx    Liver disease Neg Hx    Stomach cancer Neg Hx    Social History   Socioeconomic History   Marital status: Married    Spouse name: Not on file   Number of children: Not on file   Years of education: Not on file   Highest education level: Not on file  Occupational History   Not on file  Tobacco Use   Smoking status: Never   Smokeless tobacco: Never  Vaping Use   Vaping status: Never Used  Substance and Sexual Activity   Alcohol use: No   Drug use: No   Sexual activity: Yes    Birth control/protection: None, Post-menopausal   Other Topics Concern   Not on file  Social History Narrative   Not on file   Social Determinants of Health   Financial Resource Strain: Low Risk  (07/31/2022)   Overall Financial Resource Strain (CARDIA)    Difficulty of Paying Living Expenses: Not hard at all  Food Insecurity: No Food Insecurity (07/31/2022)   Hunger Vital Sign    Worried About Running Out of Food in the Last Year: Never true    Ran Out of Food in the Last Year: Never true  Transportation Needs: No Transportation Needs (07/31/2022)   PRAPARE - Administrator, Civil Service (Medical): No    Lack of Transportation (Non-Medical): No  Physical Activity: Sufficiently Active (07/31/2022)   Exercise Vital Sign    Days of Exercise per Week: 7 days    Minutes of Exercise per Session: 30 min  Stress: No Stress Concern Present (07/31/2022)   [employer] of Occupational Health - Occupational Stress Questionnaire    Feeling of Stress : Not at all  Social Connections: Socially Integrated (07/31/2022)   Social Connection and Isolation Panel [NHANES]    Frequency of Communication with Friends and Family: More than three times a week    Frequency of Social Gatherings with Friends and Family: More than three times a week    Attends Religious Services: More than 4 times per year    Active Member of [employer] or Organizations: Yes    Attends Engineer, structural: More than 4 times per year    Marital Status: Married     Review of Systems  Constitutional:  Negative for appetite change and unexpected weight change.  HENT:  Negative for congestion, sinus pressure and sore throat.   Eyes:  Negative for pain and visual disturbance.  Respiratory:  Negative for cough, chest tightness and shortness of breath.   Cardiovascular:  Negative for chest pain, palpitations and leg swelling.  Gastrointestinal:  Positive for constipation. Negative for abdominal pain, nausea and vomiting.  Genitourinary:  Negative for difficulty  urinating and dysuria.  Musculoskeletal:  Negative for joint swelling and myalgias.  Skin:  Negative for color change and rash.  Neurological:  Negative for dizziness and headaches.  Hematological:  Negative for adenopathy. Does not bruise/bleed easily.  Psychiatric/Behavioral:  Negative for decreased concentration and dysphoric mood.        Objective:     BP 128/70   Pulse 74   Temp 97.9 F (36.6 C)   Resp 16   Ht 5\' 3"  (1.6 m)   Wt 191 lb 12.8 oz (87 kg)   SpO2 98%   BMI 33.98 kg/m  Wt Readings from Last 3 Encounters:  10/02/22 191 lb 12.8 oz (87 kg)  07/31/22 191  lb (86.6 kg)  07/29/22 191 lb (86.6 kg)    Physical Exam Vitals reviewed.  Constitutional:      General: She is not in acute distress.    Appearance: Normal appearance. She is well-developed.  HENT:     Head: Normocephalic and atraumatic.     Right Ear: External ear normal.     Left Ear: External ear normal.  Eyes:     General: No scleral icterus.       Right eye: No discharge.        Left eye: No discharge.     Conjunctiva/sclera: Conjunctivae normal.  Neck:     Thyroid: No thyromegaly.  Cardiovascular:     Rate and Rhythm: Normal rate and regular rhythm.  Pulmonary:     Effort: No tachypnea, accessory muscle usage or respiratory distress.     Breath sounds: Normal breath sounds. No decreased breath sounds or wheezing.  Chest:  Breasts:    Right: No inverted nipple, mass, nipple discharge or tenderness (no axillary adenopathy).     Left: No inverted nipple, mass, nipple discharge or tenderness (no axilarry adenopathy).  Abdominal:     General: Bowel sounds are normal.     Palpations: Abdomen is soft.     Tenderness: There is no abdominal tenderness.  Musculoskeletal:        General: No swelling or tenderness.     Cervical back: Neck supple. No tenderness.  Lymphadenopathy:     Cervical: No cervical adenopathy.  Skin:    Findings: No erythema or rash.  Neurological:     Mental Status: She  is alert and oriented to person, place, and time.  Psychiatric:        Mood and Affect: Mood normal.        Behavior: Behavior normal.      Outpatient Encounter Medications as of 10/02/2022  Medication Sig   acetaminophen (TYLENOL) 500 MG tablet Take 1,000 mg by mouth daily as needed for mild pain or headache.    albuterol (VENTOLIN HFA) 108 (90 Base) MCG/ACT inhaler Inhale 2 puffs into the lungs every 6 (six) hours as needed for wheezing or shortness of breath.   dicyclomine (BENTYL) 10 MG capsule Take 1 capsule (10 mg total) by mouth 3 (three) times daily as needed for spasms (for abdominal pain and cramping).   famotidine (PEPCID) 20 MG tablet TAKE 1 TABLET BY MOUTH TWICE A DAY   Fluticasone Furoate (ARNUITY ELLIPTA) 100 MCG/ACT AEPB Inhale 1 puff into the lungs daily.   hydrocortisone (ANUSOL-HC) 25 MG suppository Place 1 suppository (25 mg total) rectally every 12 (twelve) hours.   imipramine (TOFRANIL) 25 MG tablet Take 1 tablet (25 mg total) by mouth 2 (two) times daily.   pantoprazole (PROTONIX) 40 MG tablet Take 1 tablet (40 mg total) by mouth 2 (two) times daily before a meal.   sucralfate (CARAFATE) 1 GM/10ML suspension Take 10 mLs (1 g total) by mouth 4 (four) times daily -  with meals and at bedtime.   tolterodine (DETROL LA) 4 MG 24 hr capsule Take 1 capsule (4 mg total) by mouth daily.   [DISCONTINUED] busPIRone (BUSPAR) 5 MG tablet Take 1-2 tablets (5-10 mg total) by mouth 3 (three) times daily.   [DISCONTINUED] topiramate (TOPAMAX) 25 MG tablet Take 1 tablet (25 mg total) by mouth 2 (two) times daily.   topiramate (TOPAMAX) 25 MG tablet Take 1 tablet (25 mg total) by mouth 2 (two) times daily.   No facility-administered encounter medications  on file as of 10/02/2022.     Lab Results  Component Value Date   WBC 5.9 09/30/2022   HGB 13.6 09/30/2022   HCT 41.6 09/30/2022   PLT 248.0 09/30/2022   GLUCOSE 92 09/30/2022   CHOL 197 09/30/2022   TRIG 80.0 09/30/2022   HDL  77.40 09/30/2022   LDLCALC 104 (H) 09/30/2022   ALT 26 09/30/2022   AST 21 09/30/2022   NA 140 09/30/2022   K 3.7 09/30/2022   CL 106 09/30/2022   CREATININE 0.98 09/30/2022   BUN 11 09/30/2022   CO2 27 09/30/2022   TSH 2.47 01/28/2022   HGBA1C 5.4 10/03/2019    MM 3D SCREEN BREAST BILATERAL  Result Date: 12/03/2021 CLINICAL DATA:  Screening. EXAM: DIGITAL SCREENING BILATERAL MAMMOGRAM WITH TOMOSYNTHESIS AND CAD TECHNIQUE: Bilateral screening digital craniocaudal and mediolateral oblique mammograms were obtained. Bilateral screening digital breast tomosynthesis was performed. The images were evaluated with computer-aided detection. COMPARISON:  Previous exam(s). ACR Breast Density Category b: There are scattered areas of fibroglandular density. FINDINGS: There are no findings suspicious for malignancy. IMPRESSION: No mammographic evidence of malignancy. A result letter of this screening mammogram will be mailed directly to the patient. RECOMMENDATION: Screening mammogram in one year. (Code:SM-B-01Y) BI-RADS CATEGORY  1: Negative. Electronically Signed   By: Harmon Pier M.D.   On: 12/03/2021 12:54       Assessment & Plan:  Routine general medical examination at a health care facility  Health care maintenance Assessment & Plan: Physical today 10/02/22.  Mammogram 12/02/21 - Birads I.  Colonoscopy 07/2022  - one 5 mm polyp in the transverse colon, friability with no bleeding in the proximal rectum and non bleeding external and internal hemorrhoids.    Hypercholesteremia Assessment & Plan: The 10-year ASCVD risk score (Arnett DK, et al., 2019) is: 6.9%   Values used to calculate the score:     Age: 53 years     Sex: Female     Is Non-Hispanic African American: No     Diabetic: No     Tobacco smoker: No     Systolic Blood Pressure: 128 mmHg     Is BP treated: No     HDL Cholesterol: 77.4 mg/dL     Total Cholesterol: 197 mg/dL  Low cholesterol diet and exercise.  Follow lipid panel.     Elevated alkaline phosphatase level Assessment & Plan: Has had extensive GI w/up as outlined.  Follow liver panel.   Orders: -     Topiramate; Take 1 tablet (25 mg total) by mouth 2 (two) times daily.  Dispense: 180 tablet; Refill: 1 -     Alkaline phosphatase, isoenzymes; Future  Encounter for screening mammogram for malignant neoplasm of breast -     3D Screening Mammogram, Left and Right; Future  Generalized abdominal pain Assessment & Plan: Has been followed by GI for continued issues with nausea and abdominal pain. Has had extensive GI w/up as outlined in Dr Orvan Falconer note 06/06/22.  Had f/u with her 05/2022 - recommended to continue protonix, pepcid and carafate.  Recommended miralax, mag oxide and diet to help with constipation.  Continue buspar 10mg  prior to meals.  Recommended capsule endoscopy - negative 06/24/22. Had f/u with GI 07/16/22 - treated for diverticulitis.  Instructed on regular scheduled miralax.  Recommended colonoscopy. Colonoscopy 07/2022 - one 5 mm polyp in the transverse colon, friability with no bleeding in the proximal rectum and non bleeding external and internal hemorrhoids. She is off carafate.  Taking miralax daily to keep bowels continued issues with nausea and abdominal pain. Has had extensive GI w/up as outlined in Dr Orvan Falconer note 06/06/22.  Had f/u with her 05/2022 - recommended to continue protonix, pepcid and carafate.  Recommended miralax, mag oxide and diet to help with constipation.  Continue buspar 10mg  prior to meals.  Recommended capsule endoscopy - negative 06/24/22. Had f/u with GI 07/16/22 - treated for diverticulitis.  Instructed on regular scheduled miralax.  Recommended colonoscopy. Colonoscopy 07/2022 - one 5 mm polyp in the transverse colon, friability with no bleeding in the proximal rectum and non bleeding external and internal hemorrhoids. She is off carafate.  Taking miralax daily to keep bowels moving.    Mild persistent asthma without  complication Assessment & Plan: Breathing appears to be stable.  Has rescue inhaler if needed.  Evaluated 11/2021 - pulmonary.  Continue flovent.  Stable.    Gastroesophageal reflux disease, unspecified whether esophagitis present Assessment & Plan: EGD February 2022-gastritis.  Continue proton pump inhibitor and Pepcid.  Continues to follow-up with GI (Dr. Orvan Falconer). W/up as outlined.  Normal gastric emptying study.    Interstitial cystitis Assessment & Plan: Has been evaluated and followed by urology. Imipramine.       Dale Brocket, MD

## 2022-10-02 NOTE — Assessment & Plan Note (Signed)
Physical today 10/02/22.  Mammogram 12/02/21 - Birads I.  Colonoscopy 07/2022  - one 5 mm polyp in the transverse colon, friability with no bleeding in the proximal rectum and non bleeding external and internal hemorrhoids.

## 2022-10-02 NOTE — Assessment & Plan Note (Addendum)
The 10-year ASCVD risk score (Arnett DK, et al., 2019) is: 6.9%   Values used to calculate the score:     Age: 67 years     Sex: Female     Is Non-Hispanic African American: No     Diabetic: No     Tobacco smoker: No     Systolic Blood Pressure: 128 mmHg     Is BP treated: No     HDL Cholesterol: 77.4 mg/dL     Total Cholesterol: 197 mg/dL  Low cholesterol diet and exercise.  Follow lipid panel.

## 2022-10-07 ENCOUNTER — Encounter: Payer: Self-pay | Admitting: Internal Medicine

## 2022-10-07 NOTE — Assessment & Plan Note (Signed)
Has had extensive GI w/up as outlined.  Follow liver panel.

## 2022-10-07 NOTE — Assessment & Plan Note (Signed)
Has been evaluated and followed by urology. Imipramine.

## 2022-10-07 NOTE — Assessment & Plan Note (Signed)
Breathing appears to be stable.  Has rescue inhaler if needed.  Evaluated 11/2021 - pulmonary.  Continue flovent.  Stable.

## 2022-10-07 NOTE — Assessment & Plan Note (Signed)
Has been followed by GI for continued issues with nausea and abdominal pain. Has had extensive GI w/up as outlined in Dr Orvan Falconer note 06/06/22.  Had f/u with her 05/2022 - recommended to continue protonix, pepcid and carafate.  Recommended miralax, mag oxide and diet to help with constipation.  Continue buspar 10mg  prior to meals.  Recommended capsule endoscopy - negative 06/24/22. Had f/u with GI 07/16/22 - treated for diverticulitis.  Instructed on regular scheduled miralax.  Recommended colonoscopy. Colonoscopy 07/2022 - one 5 mm polyp in the transverse colon, friability with no bleeding in the proximal rectum and non bleeding external and internal hemorrhoids. She is off carafate.  Taking miralax daily to keep bowels continued issues with nausea and abdominal pain. Has had extensive GI w/up as outlined in Dr Orvan Falconer note 06/06/22.  Had f/u with her 05/2022 - recommended to continue protonix, pepcid and carafate.  Recommended miralax, mag oxide and diet to help with constipation.  Continue buspar 10mg  prior to meals.  Recommended capsule endoscopy - negative 06/24/22. Had f/u with GI 07/16/22 - treated for diverticulitis.  Instructed on regular scheduled miralax.  Recommended colonoscopy. Colonoscopy 07/2022 - one 5 mm polyp in the transverse colon, friability with no bleeding in the proximal rectum and non bleeding external and internal hemorrhoids. She is off carafate.  Taking miralax daily to keep bowels moving.

## 2022-10-07 NOTE — Assessment & Plan Note (Signed)
EGD February 2022-gastritis.  Continue proton pump inhibitor and Pepcid.  Continues to follow-up with GI (Dr. Orvan Falconer). W/up as outlined.  Normal gastric emptying study.

## 2022-10-15 ENCOUNTER — Telehealth: Payer: Self-pay | Admitting: Internal Medicine

## 2022-10-15 DIAGNOSIS — R748 Abnormal levels of other serum enzymes: Secondary | ICD-10-CM

## 2022-10-15 NOTE — Telephone Encounter (Signed)
Patient need lab orders.

## 2022-10-16 NOTE — Telephone Encounter (Signed)
She just had fasting labs done 09/2022. There is an order for alkaline phosphatase ordered. Just wanted to confirm this is all she needs ?

## 2022-10-16 NOTE — Addendum Note (Signed)
Addended by: Rita Ohara D on: 10/16/2022 01:22 PM   Modules accepted: Orders

## 2022-10-16 NOTE — Telephone Encounter (Signed)
Future labs in

## 2022-10-16 NOTE — Telephone Encounter (Signed)
GGT and alk phos isoenzymes are ordered.  These are the only two tests I need.  Thanks.

## 2022-10-23 ENCOUNTER — Other Ambulatory Visit (INDEPENDENT_AMBULATORY_CARE_PROVIDER_SITE_OTHER): Payer: Medicare HMO

## 2022-10-23 DIAGNOSIS — R748 Abnormal levels of other serum enzymes: Secondary | ICD-10-CM

## 2022-10-23 LAB — GAMMA GT: GGT: 196 U/L — ABNORMAL HIGH (ref 7–51)

## 2022-11-03 ENCOUNTER — Encounter: Payer: Self-pay | Admitting: Internal Medicine

## 2022-11-03 NOTE — Telephone Encounter (Signed)
Called and spoke to Savannah Gomez.  Persistent intermittent GI issues - is being followed by GI.  Discussed labs.  Will forward to GI.  Has f/u in 11/2022.  Does have history of fatty liver.

## 2022-11-26 ENCOUNTER — Encounter: Payer: Self-pay | Admitting: Gastroenterology

## 2022-11-26 ENCOUNTER — Ambulatory Visit: Payer: Medicare HMO | Admitting: Urology

## 2022-11-26 ENCOUNTER — Encounter: Payer: Self-pay | Admitting: Urology

## 2022-11-26 ENCOUNTER — Other Ambulatory Visit (INDEPENDENT_AMBULATORY_CARE_PROVIDER_SITE_OTHER): Payer: Medicare HMO

## 2022-11-26 ENCOUNTER — Ambulatory Visit: Payer: Medicare HMO | Admitting: Gastroenterology

## 2022-11-26 VITALS — BP 132/60 | HR 67 | Ht 63.0 in | Wt 192.2 lb

## 2022-11-26 VITALS — BP 133/77 | HR 76 | Ht 63.0 in | Wt 196.0 lb

## 2022-11-26 DIAGNOSIS — K831 Obstruction of bile duct: Secondary | ICD-10-CM

## 2022-11-26 DIAGNOSIS — R7989 Other specified abnormal findings of blood chemistry: Secondary | ICD-10-CM

## 2022-11-26 DIAGNOSIS — R350 Frequency of micturition: Secondary | ICD-10-CM

## 2022-11-26 DIAGNOSIS — R1013 Epigastric pain: Secondary | ICD-10-CM

## 2022-11-26 DIAGNOSIS — R112 Nausea with vomiting, unspecified: Secondary | ICD-10-CM | POA: Diagnosis not present

## 2022-11-26 DIAGNOSIS — N3941 Urge incontinence: Secondary | ICD-10-CM | POA: Diagnosis not present

## 2022-11-26 DIAGNOSIS — Z87442 Personal history of urinary calculi: Secondary | ICD-10-CM

## 2022-11-26 DIAGNOSIS — N3010 Interstitial cystitis (chronic) without hematuria: Secondary | ICD-10-CM | POA: Diagnosis not present

## 2022-11-26 DIAGNOSIS — N200 Calculus of kidney: Secondary | ICD-10-CM

## 2022-11-26 LAB — COMPREHENSIVE METABOLIC PANEL
ALT: 21 U/L (ref 0–35)
AST: 19 U/L (ref 0–37)
Albumin: 4.3 g/dL (ref 3.5–5.2)
Alkaline Phosphatase: 147 U/L — ABNORMAL HIGH (ref 39–117)
BUN: 10 mg/dL (ref 6–23)
CO2: 28 meq/L (ref 19–32)
Calcium: 9.4 mg/dL (ref 8.4–10.5)
Chloride: 105 meq/L (ref 96–112)
Creatinine, Ser: 0.87 mg/dL (ref 0.40–1.20)
GFR: 68.49 mL/min (ref >60.00)
Glucose, Bld: 108 mg/dL — ABNORMAL HIGH (ref 70–99)
Potassium: 3.8 meq/L (ref 3.5–5.1)
Sodium: 140 meq/L (ref 135–145)
Total Bilirubin: 0.6 mg/dL (ref 0.2–1.2)
Total Protein: 7.1 g/dL (ref 6.0–8.3)

## 2022-11-26 LAB — CBC WITH DIFFERENTIAL/PLATELET
Basophils Absolute: 0 10*3/uL (ref 0.0–0.1)
Basophils Relative: 0.4 % (ref 0.0–3.0)
Eosinophils Absolute: 0.4 10*3/uL (ref 0.0–0.7)
Eosinophils Relative: 5.2 % — ABNORMAL HIGH (ref 0.0–5.0)
HCT: 41.5 % (ref 36.0–46.0)
Hemoglobin: 13.7 g/dL (ref 12.0–15.0)
Lymphocytes Relative: 24.6 % (ref 12.0–46.0)
Lymphs Abs: 1.7 10*3/uL (ref 0.7–4.0)
MCHC: 33.1 g/dL (ref 30.0–36.0)
MCV: 86.6 fl (ref 78.0–100.0)
Monocytes Absolute: 0.5 10*3/uL (ref 0.1–1.0)
Monocytes Relative: 6.7 % (ref 3.0–12.0)
Neutro Abs: 4.4 10*3/uL (ref 1.4–7.7)
Neutrophils Relative %: 63.1 % (ref 43.0–77.0)
Platelets: 259 10*3/uL (ref 150.0–400.0)
RBC: 4.8 Mil/uL (ref 3.87–5.11)
RDW: 15.1 % (ref 11.5–15.5)
WBC: 7 10*3/uL (ref 4.0–10.5)

## 2022-11-26 LAB — URINALYSIS, COMPLETE
Bilirubin, UA: NEGATIVE
Glucose, UA: NEGATIVE
Ketones, UA: NEGATIVE
Leukocytes,UA: NEGATIVE
Nitrite, UA: NEGATIVE
Protein,UA: NEGATIVE
RBC, UA: NEGATIVE
Specific Gravity, UA: <1.005 — ABNORMAL LOW (ref 1.005–1.030)
Urobilinogen, Ur: 0.2 mg/dL (ref 0.2–1.0)
pH, UA: 6 (ref 5.0–7.5)

## 2022-11-26 LAB — MICROSCOPIC EXAMINATION: Epithelial Cells (non renal): >10 /HPF — AB (ref 0–10)

## 2022-11-26 LAB — PROTIME-INR
INR: 1 ratio (ref 0.8–1.0)
Prothrombin Time: 10.5 s (ref 9.6–13.1)

## 2022-11-26 MED ORDER — SUCRALFATE 1 GM/10ML PO SUSP: 1.0000 g | Freq: Three times a day (TID) | ORAL | 2 refills | Status: AC

## 2022-11-26 MED ORDER — IMIPRAMINE HCL 25 MG PO TABS
25.0000 mg | ORAL_TABLET | Freq: Two times a day (BID) | ORAL | 3 refills | Status: DC
Start: 2022-11-26 — End: 2023-11-25

## 2022-11-26 MED ORDER — TOLTERODINE TARTRATE ER 4 MG PO CP24: 4.0000 mg | ORAL_CAPSULE | Freq: Every day | ORAL | 3 refills | Status: DC

## 2022-11-26 NOTE — Progress Notes (Signed)
Savannah Gomez    [id number]    1955/12/24  Primary Care Physician:Scott, Westley Hummer, MD  Referring Physician: Dale , MD [address] [place],  [place] [address]   Chief complaint:  Chief Complaint  Patient presents with   Abdominal Pain    Still having abdominal pain   Elevated LFT's    Labs from July in [facility]   Colonoscopy    Follow up after having colonoscopy     HPI: Savannah Gomez is a 67 y.o. female presenting to clinic today for consult for IDA  She was last seen by PA Amy Esterwood on 07-16-22 for IDA. She is a previous patient of Dr. Orvan Falconer.   Today, she continues to complains of extreme epigastric pain accompanied by chronic nausea and chills. Her pain tends to be worse after eating. She states that she typically has nausea without vomiting but reports having also has an episode of vomiting green liquid. She states that the pain is similar to when she had a cholecystomy.   She also complains of black stool and states that she was previously on Iron supplements for her IDA. Her black stool continues despite stopping her iron supplements. She is currently finishing up her bottle which she takes every other day. She is also taking Protonix 40 mg and Carafate 10 mg.   She reports being on 1/2 capful of Miralax and reports typically having a BM 1-2x a week.    Patient denies any diarrhea, constipation, blood in stool, bloating, unintentional weight loss, reflux, dysphagia.   GI Hx:  Colonoscopy 07-29-22 - One 5 mm polyp in the transverse colon, removed with a cold snare. Resected and retrieved.  - Friability with no bleeding in the proximal rectum. Biopsied. Exclude sterocoral proctitis  - Non-bleeding external and internal hemorrhoids. 1. Surgical [P], colon, transverse, polyp (1) - POLYPOID FRAGMENT OF BENIGN COLONIC MUCOSA WITH MILD HYPERPLASTIC CHANGE (MULTIPLE LEVELS EXAMINED) 2. Surgical [P], colon, rectum  inflammation - BENIGN COLORECTAL MUCOSA WITH MILD REACTIVE/REPARATIVE CHANGES - NEGATIVE FOR FEATURES OF CHRONICITY OR ACUTE INFLAMMATION - NEGATIVE FOR DYSPLASIA OR MALIGNANCY  Capsul EGD 06-24-22 Normal Exam   DG Abd 1 11-21-21 1. No definite nephrolithiasis identified. 2. Extensive bowel content identified throughout the colon.  CT Abdomen Pelvis w contrast  05-28-21 1. Hepatic steatosis. 2. Evidence of prior cholecystectomy and hysterectomy. 3. Small hiatal hernia. 4. Colonic diverticulosis.  Alpha gal testing negative 01/22/21   Gastric emptying scan 05/28/2020 -normal   EGD 05-01-20 - Normal esophagus. Biopsied.  - Normal stomach mucosa. Biopsied.  - Small gastric polyp. Likely fundic gland polyp. Biopsied. - Food (residue) in the stomach.  - Normal examined duodenum. Biopsied.  - The examination was otherwise normal 1. Surgical [P], duodenal bx - DUODENAL MUCOSA WITH NO SIGNIFICANT PATHOLOGIC FINDINGS. - NEGATIVE FOR INCREASED INTRAEPITHELIAL LYMPHOCYTES AND VILLOUS ARCHITECTURAL CHANGES. 2. Surgical [P], gastric bx - GASTRIC ANTRAL AND OXYNTIC MUCOSA WITH MILD CHRONIC GASTRITIS. 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 - WARTHIN-STARRY STAIN IS NEGATIVE FOR HELICOBACTER PYLORI. 3. Surgical [P], gastric polyp bx - FUNDIC GLAND POLYP. - NEGATIVE FOR DYSPLASIA. 4. Surgical [P], distal esophagus - SQUAMOCOLUMNAR ESOPHAGEAL MUCOSA WITH REACTIVE/REGENERATIVE CHANGES. - NEGATIVE FOR INTESTINAL METAPLASIA (GOBLET CELL METAPLASIA). - NEGATIVE FOR INCREASED INTRAEPITHELIAL EOSINOPHILS.  MR Abdomen MRCP W WO contrast 01-30-20 1. Status post cholecystectomy. No biliary dilatation or common bile duct stones. 2. Moderate pancreatic atrophy. 3. No  acute abdominal findings, mass lesions or adenopathy.  Colonoscopy 09-20-18 - One small polyp in the mid ascending colon, removed with a hot snare. Resected and retrieved. Biopsied. Clip was placed.  -  Diverticulosis in the sigmoid colon.  - Internal hemorrhoids A.  STOMACH, DISTAL BODY GREATER CURVATURE; COLD BIOPSY:  - OXYNTIC MUCOSA WITH CHANGES CONSISTENT WITH PROTON PUMP INHIBITOR USE.  - NEGATIVE FOR H. PYLORI, DYSPLASIA, AND MALIGNANCY.   B.  COLON POLYP, A SENDING; HOT SNARE AND COLD BIOPSY:  - HYPERPLASTIC POLYP, CAUTERIZED.  - NEGATIVE FOR DYSPLASIA AND MALIGNANCY.   EGD 09-20-18 hiatal hernia, no H pylori, small hyperplastic polyp, diverticulosis, small internal hemorrhoids   Colonoscopy 12-13-13  Diverticulosis in the sigmoid colon -Internal hemorrhoids  -Erythematous mucosa in the rectum    Current Outpatient Medications:    acetaminophen (TYLENOL) 500 MG tablet, Take 1,000 mg by mouth daily as needed for mild pain or headache. , Disp: , Rfl:    albuterol (VENTOLIN HFA) 108 (90 Base) MCG/ACT inhaler, Inhale 2 puffs into the lungs every 6 (six) hours as needed for wheezing or shortness of breath., Disp: 1 each, Rfl: 10   dicyclomine (BENTYL) 10 MG capsule, Take 1 capsule (10 mg total) by mouth 3 (three) times daily as needed for spasms (for abdominal pain and cramping)., Disp: 90 capsule, Rfl: 6   famotidine (PEPCID) 20 MG tablet, TAKE 1 TABLET BY MOUTH TWICE A DAY, Disp: 60 tablet, Rfl: 11   Fluticasone Furoate (ARNUITY ELLIPTA) 100 MCG/ACT AEPB, Inhale 1 puff into the lungs daily., Disp: 30 each, Rfl: 5   hydrocortisone (ANUSOL-HC) 25 MG suppository, Place 1 suppository (25 mg total) rectally every 12 (twelve) hours., Disp: 12 suppository, Rfl: 1   imipramine (TOFRANIL) 25 MG tablet, Take 1 tablet (25 mg total) by mouth 2 (two) times daily., Disp: 180 tablet, Rfl: 3   pantoprazole (PROTONIX) 40 MG tablet, Take 1 tablet (40 mg total) by mouth 2 (two) times daily before a meal., Disp: 180 tablet, Rfl: 3   sucralfate (CARAFATE) 1 GM/10ML suspension, Take 10 mLs (1 g total) by mouth 4 (four) times daily -  with meals and at bedtime., Disp: 1200 mL, Rfl: 2   tolterodine (DETROL  LA) 4 MG 24 hr capsule, Take 1 capsule (4 mg total) by mouth daily., Disp: 90 capsule, Rfl: 3   topiramate (TOPAMAX) 25 MG tablet, Take 1 tablet (25 mg total) by mouth 2 (two) times daily., Disp: 180 tablet, Rfl: 1   Allergies as of 11/26/2022 - Review Complete 11/26/2022  Allergen Reaction Noted   Demerol [meperidine] Nausea Only 01/16/2012    Past Medical History:  Diagnosis Date   Allergy    Arthritis    Asthma    Chicken pox    Complication of anesthesia    GERD (gastroesophageal reflux disease)    History of kidney stones    Migraine headache    PONV (postoperative nausea and vomiting)    Urine incontinence     Past Surgical History:  Procedure Laterality Date   BREAST CYST ASPIRATION Bilateral    neg   carpal tunnel sugery     bilateral   CHOLECYSTECTOMY N/A 11/04/2019   Procedure: LAPAROSCOPIC CHOLECYSTECTOMY WITH INTRAOPERATIVE CHOLANGIOGRAM;  Surgeon: Earline Mayotte, MD;  Location: [facility] ORS;  Service: General;  Laterality: N/A;   COLONOSCOPY WITH PROPOFOL N/A 09/20/2018   Procedure: COLONOSCOPY WITH PROPOFOL;  Surgeon: Scot Jun, MD;  Location: [facility] ENDOSCOPY;  Service: Endoscopy;  Laterality: N/A;  ELBOW SURGERY  1999   ESOPHAGOGASTRODUODENOSCOPY (EGD) WITH PROPOFOL N/A 09/20/2018   Procedure: ESOPHAGOGASTRODUODENOSCOPY (EGD) WITH PROPOFOL;  Surgeon: Scot Jun, MD;  Location[facility] ENDOSCOPY;  Service: Endoscopy;  Laterality: N/A;   HAND SURGERY  1999   broken finger - pins   NASAL SINUS SURGERY  1992   ROTATOR CUFF REPAIR  1994   Trigger finger surgery  1999 and 2000   VAGINAL HYSTERECTOMY  1987   secondary to fibroids    Family History  Problem Relation Age of Onset   Colon cancer Mother        colon   Asthma Mother    Heart disease Father    Hypertension Father    Colon cancer Sister    Stroke Maternal Grandfather    Diabetes Maternal Grandfather    Lung cancer Maternal Grandmother    Breast cancer Maternal Grandmother     Esophageal cancer Neg Hx    Pancreatic cancer Neg Hx    Liver disease Neg Hx    Stomach cancer Neg Hx     Social History   Socioeconomic History   Marital status: Married    Spouse name: Not on file   Number of children: Not on file   Years of education: Not on file   Highest education level: Not on file  Occupational History   Not on file  Tobacco Use   Smoking status: Never   Smokeless tobacco: Never  Vaping Use   Vaping status: Never Used  Substance and Sexual Activity   Alcohol use: No   Drug use: No   Sexual activity: Yes    Birth control/protection: None, Post-menopausal  Other Topics Concern   Not on file  Social History Narrative   Not on file   Social Determinants of Health   Financial Resource Strain: Low Risk  (07/31/2022)   Overall Financial Resource Strain (CARDIA)    Difficulty of Paying Living Expenses: Not hard at all  Food Insecurity: No Food Insecurity (07/31/2022)   Hunger Vital Sign    Worried About Running Out of Food in the Last Year: Never true    Ran Out of Food in the Last Year: Never true  Transportation Needs: No Transportation Needs (07/31/2022)   PRAPARE - Administrator, Civil Service (Medical): No    Lack of Transportation (Non-Medical): No  Physical Activity: Sufficiently Active (07/31/2022)   Exercise Vital Sign    Days of Exercise per Week: 7 days    Minutes of Exercise per Session: 30 min  Stress: No Stress Concern Present (07/31/2022)   [employer] of Occupational Health - Occupational Stress Questionnaire    Feeling of Stress : Not at all  Social Connections: Socially Integrated (07/31/2022)   Social Connection and Isolation Panel [NHANES]    Frequency of Communication with Friends and Family: More than three times a week    Frequency of Social Gatherings with Friends and Family: More than three times a week    Attends Religious Services: More than 4 times per year    Active Member of [employer] or Organizations: Yes     Attends Banker Meetings: More than 4 times per year    Marital Status: Married  Catering manager Violence: Not At Risk (07/31/2022)   Humiliation, Afraid, Rape, and Kick questionnaire    Fear of Current or Ex-Partner: No    Emotionally Abused: No    Physically Abused: No    Sexually Abused: No  Review of systems: Review of Systems  Constitutional:  Positive for chills. Negative for unexpected weight change.  HENT:  Negative for trouble swallowing.   Gastrointestinal:  Positive for abdominal pain, nausea and vomiting. Negative for abdominal distention, anal bleeding, blood in stool, constipation, diarrhea and rectal pain.       +black stool     Physical Exam: Vitals:   11/26/22 1401  BP: 132/60  Pulse: 67   Body mass index is 34.05 kg/m.  General: well-appearing   Eyes: sclera anicteric, no redness ENT: oral mucosa moist without lesions, no cervical or supraclavicular lymphadenopathy CV: RRR, no JVD, no peripheral edema Resp: clear to auscultation bilaterally, normal RR and effort noted GI: soft, RUQ and epigastric tenderness, with active bowel sounds. No guarding or palpable organomegaly noted. Skin; warm and dry, no rash or jaundice noted Neuro: awake, alert and oriented x 3. Normal gross motor function and fluent speech   Data Reviewed:  Reviewed labs, radiology imaging, old records and pertinent past GI work up   Assessment and Plan/Recommendations: 67 year old very pleasant female with iron deficiency anemia with complaints of persistent epigastric abdominal pain, has intermittent nausea and vomiting We will plan to proceed with EGD for evaluation Advised patient to continue pantoprazole 40 mg twice daily before breakfast and dinner Use Carafate 1 mg before meals and at bedtime as needed  Follow-up CBC, CMP, PT and INR Will obtain MRI abdomen MRCP to exclude pancreatic duct/ biliary abnormality, has persistent elevation in alkaline phosphatase GGT is  also elevated  Follow-up after EGD and MRCP   The patient was provided an opportunity to ask questions and all were answered. The patient agreed with the plan and demonstrated an understanding of the instructions.  Iona Beard , MD  CC: Dale [place], MD   Ladona Mow Hewitt Shorts as a scribe for Marsa Aris, MD.,have documented all relevant documentation on the behalf of Marsa Aris, MD,as directed by  Marsa Aris, MD while in the presence of Marsa Aris, MD.   I, Marsa Aris, MD, have reviewed all documentation for this visit. The documentation on 11/26/22 for the exam, diagnosis, procedures, and orders are all accurate and complete.

## 2022-11-26 NOTE — Patient Instructions (Addendum)
You have been scheduled for an endoscopy. Please follow written instructions given to you at your visit today.  If you use inhalers (even only as needed), please bring them with you on the day of your procedure.  If you take any of the following medications, they will need to be adjusted prior to your procedure:   DO NOT TAKE 7 DAYS PRIOR TO TEST- Trulicity (dulaglutide) Ozempic, Wegovy (semaglutide) Mounjaro (tirzepatide) Bydureon Bcise (exanatide extended release)  DO NOT TAKE 1 DAY PRIOR TO YOUR TEST Rybelsus (semaglutide) Adlyxin (lixisenatide) Victoza (liraglutide) Byetta (exanatide) ___________________________________________________________________________   Your provider has requested that you go to the basement level for lab work before leaving today. Press "B" on the elevator. The lab is located at the first door on the left as you exit the elevator.   You will be contacted by [facility] in the next 2 days to arrange a MR/MRCP.  The number on your caller ID will be [phone], please answer when they call.  If you have not heard from them in 2 days please call [phone] to schedule.    We have sent the following medications to your pharmacy for you to pick up at your convenience:  Carafate  Due to recent changes in healthcare laws, you may see the results of your imaging and laboratory studies on MyChart before your provider has had a chance to review them.  We understand that in some cases there may be results that are confusing or concerning to you. Not all laboratory results come back in the same time frame and the provider may be waiting for multiple results in order to interpret others.  Please give [place] 48 hours in order for your provider to thoroughly review all the results before contacting the office for clarification of your results.    I appreciate the  opportunity to care for you  Thank You   Marsa Aris , MD

## 2022-11-26 NOTE — Progress Notes (Signed)
I,Dina M Abdulla,acting as a scribe for Riki Altes, MD.,have documented all relevant documentation on the behalf of Riki Altes, MD,as directed by  Riki Altes, MD while in the presence of Riki Altes, MD.  11/26/2022 11:48 AM   Savannah Gomez [birth year] [id number]  Referring provider: Dale [place], MD [address] [place],  [place] [address]  Chief Complaint  Patient presents with   Follow-up    1 year follow up    Urologic history: 1.  Interstitial cystitis - Imipramine 50 mg daily - Tolterodine 4 mg daily   2.  Left nephrolithiasis - 4 mm nonobstructing left lower pole calculus CT 07/2018 - CT 05/2021 showed no urinary tract calculi   HPI: 67 y.o. female presents for annual follow-up  Doing well since last visit No bothersome LUTS Denies dysuria, gross hematuria Denies flank, abdominal or pelvic pain  Remains on extended release Tolteridine and Imipramine   PMH: Past Medical History:  Diagnosis Date   Allergy    Arthritis    Asthma    Chicken pox    Complication of anesthesia    GERD (gastroesophageal reflux disease)    History of kidney stones    Migraine headache    PONV (postoperative nausea and vomiting)    Urine incontinence     Surgical History: Past Surgical History:  Procedure Laterality Date   BREAST CYST ASPIRATION Bilateral    neg   carpal tunnel sugery     bilateral   CHOLECYSTECTOMY N/A 11/04/2019   Procedure: LAPAROSCOPIC CHOLECYSTECTOMY WITH INTRAOPERATIVE CHOLANGIOGRAM;  Surgeon: Earline Mayotte, MD;  Location: [facility] ORS;  Service: General;  Laterality: N/A;   COLONOSCOPY WITH PROPOFOL N/A 09/20/2018   Procedure: COLONOSCOPY WITH PROPOFOL;  Surgeon: Scot Jun, MD;  Location: [facility] ENDOSCOPY;  Service: Endoscopy;  Laterality: N/A;   ELBOW SURGERY  1999   ESOPHAGOGASTRODUODENOSCOPY (EGD) WITH PROPOFOL N/A 09/20/2018   Procedure: ESOPHAGOGASTRODUODENOSCOPY (EGD) WITH PROPOFOL;  Surgeon:  Scot Jun, MD;  Location: [facility] ENDOSCOPY;  Service: Endoscopy;  Laterality: N/A;   HAND SURGERY  1999   broken finger - pins   NASAL SINUS SURGERY  1992   ROTATOR CUFF REPAIR  1994   Trigger finger surgery  1999 and 2000   VAGINAL HYSTERECTOMY  1987   secondary to fibroids    Home Medications:  Allergies as of 11/26/2022       Reactions   Demerol [meperidine] Nausea Only        Medication List        Accurate as of November 26, 2022 11:48 AM. If you have any questions, ask your nurse or doctor.          acetaminophen 500 MG tablet Commonly known as: TYLENOL Take 1,000 mg by mouth daily as needed for mild pain or headache.   albuterol 108 (90 Base) MCG/ACT inhaler Commonly known as: VENTOLIN HFA Inhale 2 puffs into the lungs every 6 (six) hours as needed for wheezing or shortness of breath.   Arnuity Ellipta 100 MCG/ACT Aepb Generic drug: Fluticasone Furoate Inhale 1 puff into the lungs daily.   dicyclomine 10 MG capsule Commonly known as: BENTYL Take 1 capsule (10 mg total) by mouth 3 (three) times daily as needed for spasms (for abdominal pain and cramping).   famotidine 20 MG tablet Commonly known as: PEPCID TAKE 1 TABLET BY MOUTH TWICE A DAY   hydrocortisone 25 MG suppository Commonly known as: ANUSOL-HC Place 1 suppository (25  mg total) rectally every 12 (twelve) hours.   imipramine 25 MG tablet Commonly known as: TOFRANIL Take 1 tablet (25 mg total) by mouth 2 (two) times daily.   pantoprazole 40 MG tablet Commonly known as: PROTONIX Take 1 tablet (40 mg total) by mouth 2 (two) times daily before a meal.   sucralfate 1 GM/10ML suspension Commonly known as: CARAFATE Take 10 mLs (1 g total) by mouth 4 (four) times daily -  with meals and at bedtime.   tolterodine 4 MG 24 hr capsule Commonly known as: DETROL LA Take 1 capsule (4 mg total) by mouth daily.   topiramate 25 MG tablet Commonly known as: TOPAMAX Take 1 tablet (25 mg total) by  mouth 2 (two) times daily.        Allergies:  Allergies  Allergen Reactions   Demerol [Meperidine] Nausea Only    Family History: Family History  Problem Relation Age of Onset   Colon cancer Mother        colon   Asthma Mother    Heart disease Father    Hypertension Father    Colon cancer Sister    Stroke Maternal Grandfather    Diabetes Maternal Grandfather    Lung cancer Maternal Grandmother    Breast cancer Maternal Grandmother    Esophageal cancer Neg Hx    Pancreatic cancer Neg Hx    Liver disease Neg Hx    Stomach cancer Neg Hx     Social History:  reports that she has never smoked. She has never used smokeless tobacco. She reports that she does not drink alcohol and does not use drugs.   Physical Exam: BP 133/77   Pulse 76   Ht 5\' 3"  (1.6 m)   Wt 196 lb (88.9 kg)   BMI 34.72 kg/m   Constitutional:  Alert and oriented, No acute distress. HEENT:  AT Respiratory: Normal respiratory effort, no increased work of breathing.   Laboratory Data:  Dipstick/microscopy negative   Assessment & Plan:    1.  Chronic interstitial cystitis Stable, imipramine refilled  2.  Urge incontinence Stable, tolterodine refilled  3.  Nephrolithiasis No suspicious calcifications seen on KUB.  Previously noted left renal calculus no longer present  4. Personal history of urinary calculi Asymptomatic     [facility] [address], [address] [place], [place] [address] [phone]

## 2022-11-27 ENCOUNTER — Encounter: Payer: Self-pay | Admitting: Gastroenterology

## 2022-12-02 ENCOUNTER — Ambulatory Visit
Admission: RE | Admit: 2022-12-02 | Discharge: 2022-12-02 | Disposition: A | Discharge status: Home / Self Care | Payer: Medicare HMO | Source: Ambulatory Visit | Attending: Gastroenterology | Admitting: Gastroenterology

## 2022-12-02 ENCOUNTER — Other Ambulatory Visit: Payer: Self-pay | Admitting: Gastroenterology

## 2022-12-02 DIAGNOSIS — K831 Obstruction of bile duct: Secondary | ICD-10-CM | POA: Diagnosis not present

## 2022-12-02 DIAGNOSIS — R1013 Epigastric pain: Secondary | ICD-10-CM | POA: Insufficient documentation

## 2022-12-02 DIAGNOSIS — R1110 Vomiting, unspecified: Secondary | ICD-10-CM | POA: Diagnosis not present

## 2022-12-02 DIAGNOSIS — R7989 Other specified abnormal findings of blood chemistry: Secondary | ICD-10-CM | POA: Insufficient documentation

## 2022-12-02 DIAGNOSIS — R112 Nausea with vomiting, unspecified: Secondary | ICD-10-CM

## 2022-12-02 MED ORDER — GADOBUTROL 1 MMOL/ML IV SOLN
8.0000 mL | Freq: Once | INTRAVENOUS | Status: AC | PRN
  Administered 2022-12-02: 8 mL via INTRAVENOUS

## 2022-12-04 ENCOUNTER — Ambulatory Visit
Admission: RE | Admit: 2022-12-04 | Discharge: 2022-12-04 | Disposition: A | Discharge status: Home / Self Care | Payer: Medicare HMO | Source: Ambulatory Visit | Attending: Internal Medicine | Admitting: Internal Medicine

## 2022-12-04 DIAGNOSIS — Z1231 Encounter for screening mammogram for malignant neoplasm of breast: Secondary | ICD-10-CM | POA: Diagnosis not present

## 2022-12-10 ENCOUNTER — Encounter: Payer: Self-pay | Admitting: Gastroenterology

## 2022-12-10 ENCOUNTER — Ambulatory Visit: Payer: Medicare HMO | Admitting: Gastroenterology

## 2022-12-10 VITALS — BP 124/60 | HR 73 | Temp 98.4°F | Resp 14 | Ht 63.0 in | Wt 192.0 lb

## 2022-12-10 DIAGNOSIS — K2970 Gastritis, unspecified, without bleeding: Secondary | ICD-10-CM | POA: Diagnosis not present

## 2022-12-10 DIAGNOSIS — R1013 Epigastric pain: Secondary | ICD-10-CM | POA: Diagnosis not present

## 2022-12-10 DIAGNOSIS — K319 Disease of stomach and duodenum, unspecified: Secondary | ICD-10-CM | POA: Diagnosis not present

## 2022-12-10 DIAGNOSIS — R112 Nausea with vomiting, unspecified: Secondary | ICD-10-CM

## 2022-12-10 MED ORDER — SODIUM CHLORIDE 0.9 % IV SOLN: 500.0000 mL | INTRAVENOUS | Status: DC

## 2022-12-10 NOTE — Patient Instructions (Addendum)
- Resume previous diet.                           - Continue present medications.                           - Await pathology results.                           - Return to GI office at the next available                            appointment in 3-4 months.  YOU HAD AN ENDOSCOPIC PROCEDURE TODAY AT THE [facility]:   Refer to the procedure report that was given to you for any specific questions about what was found during the examination.  If the procedure report does not answer your questions, please call your gastroenterologist to clarify.  If you requested that your care partner not be given the details of your procedure findings, then the procedure report has been included in a sealed envelope for you to review at your convenience later.  YOU SHOULD EXPECT: Some feelings of bloating in the abdomen. Passage of more gas than usual.  Walking can help get rid of the air that was put into your GI tract during the procedure and reduce the bloating. If you had a lower endoscopy (such as a colonoscopy or flexible sigmoidoscopy) you may notice spotting of blood in your stool or on the toilet paper. If you underwent a bowel prep for your procedure, you may not have a normal bowel movement for a few days.  Please Note:  You might notice some irritation and congestion in your nose or some drainage.  This is from the oxygen used during your procedure.  There is no need for concern and it should clear up in a day or so.  SYMPTOMS TO REPORT IMMEDIATELY:  Following upper endoscopy (EGD)  Vomiting of blood or coffee ground material  New chest pain or pain under the shoulder blades  Painful or persistently difficult swallowing  New shortness of breath  Fever of 100F or higher  Black, tarry-looking stools  For urgent or emergent issues, a gastroenterologist can be reached at any hour by calling [phone]. Do not use MyChart messaging for urgent concerns.     DIET:  We do recommend a small meal at first, but then you may proceed to your regular diet.  Drink plenty of fluids but you should avoid alcoholic beverages for 24 hours.  ACTIVITY:  You should plan to take it easy for the rest of today and you should NOT DRIVE or use heavy machinery until tomorrow (because of the sedation medicines used during the test).    FOLLOW UP: Our staff will call the number listed on your records the next business day following your procedure.  We will call around 7:15- 8:00 am to check on you and address any questions or concerns that you may have regarding the information given to you following your procedure. If we do not reach you, we will leave a message.     If any biopsies were taken you will be contacted by phone or by letter within the next 1-3 weeks.  Please call us at [phone] if you have not heard about the biopsies in 3  weeks.    SIGNATURES/CONFIDENTIALITY: You and/or your care partner have signed paperwork which will be entered into your electronic medical record.  These signatures attest to the fact that that the information above on your After Visit Summary has been reviewed and is understood.  Full responsibility of the confidentiality of this discharge information lies with you and/or your care-partner.

## 2022-12-10 NOTE — Progress Notes (Signed)
Vss nad trans to pacu 

## 2022-12-10 NOTE — Progress Notes (Signed)
Called to room to assist during endoscopic procedure.  Patient ID and intended procedure confirmed with present staff. Received instructions for my participation in the procedure from the performing physician.  

## 2022-12-10 NOTE — Progress Notes (Signed)
Please refer to office visit note 11/26/22. No additional changes in H&P Patient is appropriate for planned procedure(s) and anesthesia in an ambulatory setting  K. Scherry Ran , MD [phone]

## 2022-12-10 NOTE — Op Note (Signed)
[facility] Patient Name: Savannah Gomez Procedure Date: 12/10/2022 8:09 AM MRN: [id number] Endoscopist: Napoleon Form , MD, [id number] Age: 67 Referring MD:  Date of Birth: 1955-05-01 Gender: Female Account #: [id number] Procedure:                Upper GI endoscopy Indications:              Persistent vomiting of unknown cause, Epigastric                            abdominal pain, Dyspepsia Medicines:                Monitored Anesthesia Care Procedure:                Pre-Anesthesia Assessment:                           - Prior to the procedure, a History and Physical                            was performed, and patient medications and                            allergies were reviewed. The patient's tolerance of                            previous anesthesia was also reviewed. The risks                            and benefits of the procedure and the sedation                            options and risks were discussed with the patient.                            All questions were answered, and informed consent                            was obtained. Prior Anticoagulants: The patient has                            taken no anticoagulant or antiplatelet agents. ASA                            Grade Assessment: II - A patient with mild systemic                            disease. After reviewing the risks and benefits,                            the patient was deemed in satisfactory condition to                            undergo the procedure.  After obtaining informed consent, the endoscope was                            passed under direct vision. Throughout the                            procedure, the patient's blood pressure, pulse, and                            oxygen saturations were monitored continuously. The                            GIF [id number] #[id number] was introduced through the                            mouth, and advanced to  the second part of duodenum.                            The upper GI endoscopy was accomplished without                            difficulty. The patient tolerated the procedure                            well. Scope In: Scope Out: Findings:                 The Z-line was regular and was found 36 cm from the                            incisors.                           No gross lesions were noted in the entire esophagus.                           The gastroesophageal flap valve was visualized                            endoscopically and classified as Hill Grade IV (no                            fold, wide open lumen, hiatal hernia present).                           A 4 cm hiatal hernia was present.                           Patchy mild inflammation characterized by                            congestion (edema), erythema, friability and                            granularity was found in the entire examined  stomach. Biopsies were taken with a cold forceps                            for Helicobacter pylori testing.                           Patchy nodular mucosa was found in the gastric                            antrum. Biopsies were taken with a cold forceps for                            histology.                           The examined duodenum was normal.                           The cardia and gastric fundus were normal on                            retroflexion. Complications:            No immediate complications. Estimated Blood Loss:     Estimated blood loss was minimal. Impression:               - Z-line regular, 36 cm from the incisors.                           - No gross lesions in the entire esophagus.                           - Gastroesophageal flap valve classified as Hill                            Grade IV (no fold, wide open lumen, hiatal hernia                            present).                           - 4 cm hiatal hernia.                            - Gastritis. Biopsied.                           - Nodular mucosa in the gastric antrum. Biopsied.                           - Normal examined duodenum. Recommendation:           - Resume previous diet.                           - Continue present medications.                           -  Await pathology results.                           - Return to GI office at the next available                            appointment in 3-4 months. Napoleon Form, MD 12/10/2022 8:27:26 AM This report has been signed electronically.

## 2022-12-11 ENCOUNTER — Telehealth: Payer: Self-pay

## 2022-12-11 NOTE — Telephone Encounter (Signed)
  Follow up Call-     12/10/2022    7:16 AM 07/29/2022   12:56 PM 05/01/2020    9:34 AM  Call back number  Post procedure Call Back phone  # [phone]  Permission to leave phone message Yes Yes Yes     Patient questions:  Do you have a fever, pain , or abdominal swelling? No. Pain Score  0 *  Have you tolerated food without any problems? Yes.    Have you been able to return to your normal activities? Yes.    Do you have any questions about your discharge instructions: Diet   No. Medications  No. Follow up visit  No.  Do you have questions or concerns about your Care? No.  Actions: * If pain score is 4 or above: No action needed, pain <4.

## 2022-12-12 LAB — SURGICAL PATHOLOGY

## 2022-12-16 ENCOUNTER — Encounter: Payer: Self-pay | Admitting: Gastroenterology

## 2022-12-22 ENCOUNTER — Encounter: Payer: Self-pay | Admitting: Gastroenterology

## 2022-12-24 ENCOUNTER — Encounter: Payer: Self-pay | Admitting: Internal Medicine

## 2022-12-24 ENCOUNTER — Ambulatory Visit: Payer: Medicare HMO | Admitting: Internal Medicine

## 2022-12-24 VITALS — BP 130/68 | HR 88 | Temp 98.0°F | Ht 63.0 in | Wt 193.6 lb

## 2022-12-24 DIAGNOSIS — J4520 Mild intermittent asthma, uncomplicated: Secondary | ICD-10-CM | POA: Diagnosis not present

## 2022-12-24 NOTE — Patient Instructions (Signed)
Lets plan to stop Arnuity and assess breathing Use Arnuity as needed Use albuterol as needed  Avoid secondhand smoke Avoid SICK contacts Recommend  Masking  when appropriate Recommend Keep up-to-date with vaccinations

## 2022-12-24 NOTE — Progress Notes (Signed)
[facility] Pulmonary Medicine Consultation     MRN# [id number] Savannah Gomez 01-25-1956  Brief History: 67 yo with Hx of Asthma (positive MCH), stopped meds, and presented back to [place] with chronic cough, now on Arnuity with significant improvement.  Dx with ASTHMA 15 years ago Pneumonia 2 years ago Triggers-pollen, cats,dogs    TEST/EVENTS :    Pulmonary function test 12/11/2015 FEV1 92% FEV1/FVC 80% RV 106% TLC 102% DLCO corrected and her 42% Impression: Nonobstructive process by spirometry, no significant response to bronchodilators. Mild scooping of end expiratory curve, consistent with a mild obstructive process. Overall fits the clinical diagnosis of asthma.   6 minute walk test total distance 1240 feet/378 m low saturation 99%, highest heart rate 78    CC  Follow-up asthma    HPI  No exacerbation at this time No evidence of heart failure at this time No evidence or signs of infection at this time No respiratory distress No fevers, chills, nausea, vomiting, diarrhea No evidence of lower extremity edema No evidence hemoptysis Patient has switched to Arnuity 1 puff daily  Very infrequent use of albuterol Plan to wean off Arnuity  Triggers seasonal, smoke, exercise, cats/dogs Carpet in bedrooms   Current Outpatient Medications:    acetaminophen (TYLENOL) 500 MG tablet, Take 1,000 mg by mouth daily as needed for mild pain or headache. , Disp: , Rfl:    albuterol (VENTOLIN HFA) 108 (90 Base) MCG/ACT inhaler, Inhale 2 puffs into the lungs every 6 (six) hours as needed for wheezing or shortness of breath., Disp: 1 each, Rfl: 10   dicyclomine (BENTYL) 10 MG capsule, Take 1 capsule (10 mg total) by mouth 3 (three) times daily as needed for spasms (for abdominal pain and cramping)., Disp: 90 capsule, Rfl: 6   famotidine (PEPCID) 20 MG tablet, TAKE 1 TABLET BY MOUTH TWICE A DAY, Disp: 60 tablet, Rfl: 11   Fluticasone Furoate (ARNUITY ELLIPTA) 100 MCG/ACT  AEPB, Inhale 1 puff into the lungs daily., Disp: 30 each, Rfl: 5   hydrocortisone (ANUSOL-HC) 25 MG suppository, Place 1 suppository (25 mg total) rectally every 12 (twelve) hours., Disp: 12 suppository, Rfl: 1   imipramine (TOFRANIL) 25 MG tablet, Take 1 tablet (25 mg total) by mouth 2 (two) times daily., Disp: 180 tablet, Rfl: 3   pantoprazole (PROTONIX) 40 MG tablet, Take 1 tablet (40 mg total) by mouth 2 (two) times daily before a meal., Disp: 180 tablet, Rfl: 3   sucralfate (CARAFATE) 1 GM/10ML suspension, Take 10 mLs (1 g total) by mouth 4 (four) times daily -  with meals and at bedtime., Disp: 1200 mL, Rfl: 2   tolterodine (DETROL LA) 4 MG 24 hr capsule, Take 1 capsule (4 mg total) by mouth daily., Disp: 90 capsule, Rfl: 3   topiramate (TOPAMAX) 25 MG tablet, Take 1 tablet (25 mg total) by mouth 2 (two) times daily., Disp: 180 tablet, Rfl: 1  BP 130/68 (BP Location: Right Arm, Cuff Size: Normal)   Pulse 88   Temp 98 F (36.7 C) (Oral)   Ht 5\' 3"  (1.6 m)   Wt 193 lb 9.6 oz (87.8 kg)   SpO2 98%   BMI 34.29 kg/m     Review of Systems: Gen:  Denies  fever, sweats, chills weight loss  HEENT: Denies blurred vision, double vision, ear pain, eye pain, hearing loss, nose bleeds, sore throat Cardiac:  No dizziness, chest pain or heaviness, chest tightness,edema, No JVD Resp:   No cough, -sputum production, -shortness of  breath,-wheezing, -hemoptysis,  Other:  All other systems negative   Physical Examination:   General Appearance: No distress  EYES PERRLA, EOM intact.   NECK Supple, No JVD Pulmonary: normal breath sounds, No wheezing.  CardiovascularNormal S1,S2.  No m/r/g.   Abdomen: Benign, Soft, non-tender. Neurology UE/LE 5/5 strength, no focal deficits Ext pulses intact, cap refill intact ALL OTHER ROS ARE NEGATIVE     Assessment and Plan:  67 year old pleasant white female seen today for follow-up assessment for mild intermittent asthma mild intermittent asthma   Well-controlled, laceration at this time We will consider de-escalation of maintenance therapy Albuterol  as needed Avoid triggers  Allergic rhinitis Continue Zyrtec and intranasal Flonase  Control GERD      MEDICATION ADJUSTMENTS/LABS AND TESTS ORDERED: Avoid secondhand smoke Avoid SICK contacts Recommend  Masking  when appropriate Recommend Keep up-to-date with vaccinations Plan to wean off Arnuity and just use it as needed Use albuterol as needed   CURRENT MEDICATIONS REVIEWED AT LENGTH WITH PATIENT TODAY   Patient satisfied with Plan of action and management. All questions answered Follow-up 1 year  Total time spent 22 minutes  Wolfgang Finigan Santiago Glad, M.D.  [facility]  Medical Director [facility] Medical Director [facility] Cardio-Pulmonary Department

## 2023-01-26 ENCOUNTER — Telehealth: Payer: Self-pay | Admitting: Internal Medicine

## 2023-01-26 DIAGNOSIS — E7800 Pure hypercholesterolemia, unspecified: Secondary | ICD-10-CM

## 2023-01-26 NOTE — Telephone Encounter (Signed)
Patient need lab orders.

## 2023-01-29 NOTE — Telephone Encounter (Signed)
Future labs ordered.  

## 2023-01-30 ENCOUNTER — Other Ambulatory Visit (INDEPENDENT_AMBULATORY_CARE_PROVIDER_SITE_OTHER): Payer: Medicare HMO

## 2023-01-30 DIAGNOSIS — E7800 Pure hypercholesterolemia, unspecified: Secondary | ICD-10-CM | POA: Diagnosis not present

## 2023-01-30 LAB — CBC WITH DIFFERENTIAL/PLATELET
Basophils Absolute: 0 10*3/uL (ref 0.0–0.1)
Basophils Relative: 0.6 % (ref 0.0–3.0)
Eosinophils Absolute: 0.3 10*3/uL (ref 0.0–0.7)
Eosinophils Relative: 8.1 % — ABNORMAL HIGH (ref 0.0–5.0)
HCT: 40.7 % (ref 36.0–46.0)
Hemoglobin: 13.7 g/dL (ref 12.0–15.0)
Lymphocytes Relative: 34.4 % (ref 12.0–46.0)
Lymphs Abs: 1.5 10*3/uL (ref 0.7–4.0)
MCHC: 33.7 g/dL (ref 30.0–36.0)
MCV: 87.8 fL (ref 78.0–100.0)
Monocytes Absolute: 0.3 10*3/uL (ref 0.1–1.0)
Monocytes Relative: 7.4 % (ref 3.0–12.0)
Neutro Abs: 2.1 10*3/uL (ref 1.4–7.7)
Neutrophils Relative %: 49.5 % (ref 43.0–77.0)
Platelets: 250 10*3/uL (ref 150.0–400.0)
RBC: 4.63 Mil/uL (ref 3.87–5.11)
RDW: 14.3 % (ref 11.5–15.5)
WBC: 4.3 10*3/uL (ref 4.0–10.5)

## 2023-01-30 LAB — LIPID PANEL
Cholesterol: 182 mg/dL (ref 0–200)
HDL: 79.4 mg/dL (ref >39.00)
LDL Cholesterol: 88 mg/dL (ref 0–99)
NonHDL: 102.47
Total CHOL/HDL Ratio: 2
Triglycerides: 73 mg/dL (ref 0.0–149.0)
VLDL: 14.6 mg/dL (ref 0.0–40.0)

## 2023-01-30 LAB — BASIC METABOLIC PANEL
BUN: 11 mg/dL (ref 6–23)
CO2: 26 meq/L (ref 19–32)
Calcium: 9.2 mg/dL (ref 8.4–10.5)
Chloride: 106 meq/L (ref 96–112)
Creatinine, Ser: 0.97 mg/dL (ref 0.40–1.20)
GFR: 60.03 mL/min (ref >60.00)
Glucose, Bld: 102 mg/dL — ABNORMAL HIGH (ref 70–99)
Potassium: 3.7 meq/L (ref 3.5–5.1)
Sodium: 141 meq/L (ref 135–145)

## 2023-01-30 LAB — HEPATIC FUNCTION PANEL
ALT: 23 U/L (ref 0–35)
AST: 22 U/L (ref 0–37)
Albumin: 4.2 g/dL (ref 3.5–5.2)
Alkaline Phosphatase: 143 U/L — ABNORMAL HIGH (ref 39–117)
Bilirubin, Direct: 0.1 mg/dL (ref 0.0–0.3)
Total Bilirubin: 0.7 mg/dL (ref 0.2–1.2)
Total Protein: 6.6 g/dL (ref 6.0–8.3)

## 2023-02-03 ENCOUNTER — Ambulatory Visit (INDEPENDENT_AMBULATORY_CARE_PROVIDER_SITE_OTHER): Payer: Medicare HMO | Admitting: Internal Medicine

## 2023-02-03 ENCOUNTER — Encounter: Payer: Self-pay | Admitting: Internal Medicine

## 2023-02-03 VITALS — BP 128/70 | HR 88 | Temp 98.0°F | Resp 16 | Ht 63.0 in | Wt 193.0 lb

## 2023-02-03 DIAGNOSIS — K219 Gastro-esophageal reflux disease without esophagitis: Secondary | ICD-10-CM | POA: Diagnosis not present

## 2023-02-03 DIAGNOSIS — R748 Abnormal levels of other serum enzymes: Secondary | ICD-10-CM | POA: Diagnosis not present

## 2023-02-03 DIAGNOSIS — Z23 Encounter for immunization: Secondary | ICD-10-CM | POA: Diagnosis not present

## 2023-02-03 DIAGNOSIS — E7800 Pure hypercholesterolemia, unspecified: Secondary | ICD-10-CM

## 2023-02-03 DIAGNOSIS — J4530 Mild persistent asthma, uncomplicated: Secondary | ICD-10-CM

## 2023-02-03 DIAGNOSIS — R1084 Generalized abdominal pain: Secondary | ICD-10-CM

## 2023-02-03 NOTE — Assessment & Plan Note (Signed)
Had f/u with pulmonary 12/24/22 - recommended weaning off arnuity and use prn.  Has albuterol to use prn. Breathing stable.

## 2023-02-03 NOTE — Assessment & Plan Note (Signed)
Has had extensive GI w/up as outlined in 10/02/22 note. Capsule endoscopy - negative 06/24/22. Colonoscopy 07/2022 - one 5 mm polyp in the transverse colon, friability with no bleeding in the proximal rectum and non bleeding external and internal hemorrhoids. EGD 12/10/22 - hiatal hernia and gastritis. Continues on protonix and carafate. MRCp 12/02/22 - no evidence of biliary dectal dilatation, choledocholithiasis or other acute finding. Still with intermittent flares despite above medication regimen. Still will have intermittent episodes of regurgitation of food.  Taking carafate prior to each meal.  Protonix and pepcid bid. Watching her diet. Has f/u in December.

## 2023-02-03 NOTE — Assessment & Plan Note (Signed)
12/10/22 - EGD - 4cm hiatal hernia, gastritis. Pathology - mild reactive gastropathy. Continue protonix, pepcid and carafate.  Continue f/u with GI

## 2023-02-03 NOTE — Assessment & Plan Note (Signed)
Seeing GI.   MRCP 12/02/22 - no evidence of biliary dectal dilatation, choledocholithiasis or other acute finding. Continue f/u with GI.  Stable.  Recent check 143.

## 2023-02-03 NOTE — Assessment & Plan Note (Addendum)
The 10-year ASCVD risk score (Arnett DK, et al., 2019) is: 6.7%   Values used to calculate the score:     Age: 67 years     Sex: Female     Is Non-Hispanic African American: No     Diabetic: No     Tobacco smoker: No     Systolic Blood Pressure: 128 mmHg     Is BP treated: No     HDL Cholesterol: 79.4 mg/dL     Total Cholesterol: 182 mg/dL  Low cholesterol diet and exercise.  Follow lipid panel. Discussed calculated cholesterol risk - 6%.  Discussed calcium score.

## 2023-02-03 NOTE — Progress Notes (Signed)
Subjective:    Patient ID: Savannah Gomez, female    DOB: 1955-07-01, 67 y.o.   MRN: [id number]  Patient here for  Chief Complaint  Patient presents with   Medical Management of Chronic Issues    HPI Here for a scheduled follow up - f/u regarding persistent GI issues and hypercholesterolemia.  Has had extensive GI w/up as outlined in 10/02/22 note. Capsule endoscopy - negative 06/24/22. Colonoscopy 07/2022 - one 5 mm polyp in the transverse colon, friability with no bleeding in the proximal rectum and non bleeding external and internal hemorrhoids. EGD 12/10/22 - hiatal hernia and gastritis. Continues on protonix and carafate. MRCp 12/02/22 - no evidence of biliary dectal dilatation, choledocholithiasis or other acute finding. Still with intermittent flares despite above medication regimen. Still will have intermittent episodes of regurgitation of food.  Taking carafate prior to each meal.  Protonix and pepcid bid. Watching her diet. Has f/u in December. Had f/u with pulmonary 12/24/22 - recommended weaning off arnuity and use prn.  Has albuterol to use prn. Breathing stable. No sob. Handling stress.    Past Medical History:  Diagnosis Date   Allergy    Arthritis    Asthma    Chicken pox    Complication of anesthesia    GERD (gastroesophageal reflux disease)    History of kidney stones    Migraine headache    PONV (postoperative nausea and vomiting)    Urine incontinence    Past Surgical History:  Procedure Laterality Date   BREAST CYST ASPIRATION Bilateral    neg   carpal tunnel sugery     bilateral   CHOLECYSTECTOMY N/A 11/04/2019   Procedure: LAPAROSCOPIC CHOLECYSTECTOMY WITH INTRAOPERATIVE CHOLANGIOGRAM;  Surgeon: Earline Mayotte, MD;  Location: [facility] ORS;  Service: General;  Laterality: N/A;   COLONOSCOPY WITH PROPOFOL N/A 09/20/2018   Procedure: COLONOSCOPY WITH PROPOFOL;  Surgeon: Scot Jun, MD;  Location: [facility] ENDOSCOPY;  Service: Endoscopy;  Laterality: N/A;    ELBOW SURGERY  1999   ESOPHAGOGASTRODUODENOSCOPY (EGD) WITH PROPOFOL N/A 09/20/2018   Procedure: ESOPHAGOGASTRODUODENOSCOPY (EGD) WITH PROPOFOL;  Surgeon: Scot Jun, MD;  Location: [facility] ENDOSCOPY;  Service: Endoscopy;  Laterality: N/A;   HAND SURGERY  1999   broken finger - pins   NASAL SINUS SURGERY  1992   ROTATOR CUFF REPAIR  1994   Trigger finger surgery  1999 and 2000   VAGINAL HYSTERECTOMY  1987   secondary to fibroids   Family History  Problem Relation Age of Onset   Colon cancer Mother        colon   Asthma Mother    Heart disease Father    Hypertension Father    Colon cancer Sister    Stroke Maternal Grandfather    Diabetes Maternal Grandfather    Lung cancer Maternal Grandmother    Breast cancer Maternal Grandmother    Esophageal cancer Neg Hx    Pancreatic cancer Neg Hx    Liver disease Neg Hx    Stomach cancer Neg Hx    Social History   Socioeconomic History   Marital status: Married    Spouse name: Not on file   Number of children: Not on file   Years of education: Not on file   Highest education level: 12th grade  Occupational History   Not on file  Tobacco Use   Smoking status: Never   Smokeless tobacco: Never  Vaping Use   Vaping status: Never Used  Substance and Sexual Activity  Alcohol use: No   Drug use: No   Sexual activity: Yes    Birth control/protection: None, Post-menopausal  Other Topics Concern   Not on file  Social History Narrative   Not on file   Social Determinants of Health   Financial Resource Strain: Low Risk  (01/30/2023)   Overall Financial Resource Strain (CARDIA)    Difficulty of Paying Living Expenses: Not hard at all  Food Insecurity: No Food Insecurity (01/30/2023)   Hunger Vital Sign    Worried About Running Out of Food in the Last Year: Never true    Ran Out of Food in the Last Year: Never true  Transportation Needs: No Transportation Needs (01/30/2023)   PRAPARE - Administrator, Civil Service  (Medical): No    Lack of Transportation (Non-Medical): No  Physical Activity: Insufficiently Active (01/30/2023)   Exercise Vital Sign    Days of Exercise per Week: 3 days    Minutes of Exercise per Session: 20 min  Stress: No Stress Concern Present (01/30/2023)   [employer] of Occupational Health - Occupational Stress Questionnaire    Feeling of Stress : Only a little  Social Connections: Socially Integrated (01/30/2023)   Social Connection and Isolation Panel [NHANES]    Frequency of Communication with Friends and Family: Three times a week    Frequency of Social Gatherings with Friends and Family: Once a week    Attends Religious Services: More than 4 times per year    Active Member of [employer] or Organizations: Yes    Attends Engineer, structural: More than 4 times per year    Marital Status: Married     Review of Systems  Constitutional:  Negative for appetite change and unexpected weight change.  HENT:  Negative for congestion and sinus pressure.   Respiratory:  Negative for cough, chest tightness and shortness of breath.   Cardiovascular:  Negative for chest pain, palpitations and leg swelling.  Gastrointestinal:  Negative for vomiting.       GI issues as outlined.   Genitourinary:  Negative for difficulty urinating and dysuria.  Musculoskeletal:  Negative for joint swelling and myalgias.  Skin:  Negative for color change and rash.  Neurological:  Negative for dizziness and headaches.  Psychiatric/Behavioral:  Negative for agitation and dysphoric mood.        Objective:     BP 128/70   Pulse 88   Temp 98 F (36.7 C)   Resp 16   Ht 5\' 3"  (1.6 m)   Wt 193 lb (87.5 kg)   SpO2 99%   BMI 34.19 kg/m  Wt Readings from Last 3 Encounters:  02/03/23 193 lb (87.5 kg)  12/24/22 193 lb 9.6 oz (87.8 kg)  12/10/22 192 lb (87.1 kg)    Physical Exam Vitals reviewed.  Constitutional:      General: She is not in acute distress.    Appearance: Normal appearance.   HENT:     Head: Normocephalic and atraumatic.     Right Ear: External ear normal.     Left Ear: External ear normal.  Eyes:     General: No scleral icterus.       Right eye: No discharge.        Left eye: No discharge.     Conjunctiva/sclera: Conjunctivae normal.  Neck:     Thyroid: No thyromegaly.  Cardiovascular:     Rate and Rhythm: Normal rate and regular rhythm.  Pulmonary:     Effort:  No respiratory distress.     Breath sounds: Normal breath sounds. No wheezing.  Abdominal:     General: Bowel sounds are normal.     Palpations: Abdomen is soft.     Tenderness: There is no abdominal tenderness.  Musculoskeletal:        General: No swelling or tenderness.     Cervical back: Neck supple. No tenderness.  Lymphadenopathy:     Cervical: No cervical adenopathy.  Skin:    Findings: No erythema or rash.  Neurological:     Mental Status: She is alert.  Psychiatric:        Mood and Affect: Mood normal.        Behavior: Behavior normal.      Outpatient Encounter Medications as of 02/03/2023  Medication Sig   acetaminophen (TYLENOL) 500 MG tablet Take 1,000 mg by mouth daily as needed for mild pain or headache.    albuterol (VENTOLIN HFA) 108 (90 Base) MCG/ACT inhaler Inhale 2 puffs into the lungs every 6 (six) hours as needed for wheezing or shortness of breath.   dicyclomine (BENTYL) 10 MG capsule Take 1 capsule (10 mg total) by mouth 3 (three) times daily as needed for spasms (for abdominal pain and cramping).   famotidine (PEPCID) 20 MG tablet TAKE 1 TABLET BY MOUTH TWICE A DAY   Fluticasone Furoate (ARNUITY ELLIPTA) 100 MCG/ACT AEPB Inhale 1 puff into the lungs daily.   hydrocortisone (ANUSOL-HC) 25 MG suppository Place 1 suppository (25 mg total) rectally every 12 (twelve) hours.   imipramine (TOFRANIL) 25 MG tablet Take 1 tablet (25 mg total) by mouth 2 (two) times daily.   pantoprazole (PROTONIX) 40 MG tablet Take 1 tablet (40 mg total) by mouth 2 (two) times daily  before a meal.   sucralfate (CARAFATE) 1 GM/10ML suspension Take 10 mLs (1 g total) by mouth 4 (four) times daily -  with meals and at bedtime.   tolterodine (DETROL LA) 4 MG 24 hr capsule Take 1 capsule (4 mg total) by mouth daily.   topiramate (TOPAMAX) 25 MG tablet Take 1 tablet (25 mg total) by mouth 2 (two) times daily.   No facility-administered encounter medications on file as of 02/03/2023.     Lab Results  Component Value Date   WBC 4.3 01/30/2023   HGB 13.7 01/30/2023   HCT 40.7 01/30/2023   PLT 250.0 01/30/2023   GLUCOSE 102 (H) 01/30/2023   CHOL 182 01/30/2023   TRIG 73.0 01/30/2023   HDL 79.40 01/30/2023   LDLCALC 88 01/30/2023   ALT 23 01/30/2023   AST 22 01/30/2023   NA 141 01/30/2023   K 3.7 01/30/2023   CL 106 01/30/2023   CREATININE 0.97 01/30/2023   BUN 11 01/30/2023   CO2 26 01/30/2023   TSH 2.47 01/28/2022   INR 1.0 11/26/2022   HGBA1C 5.4 10/03/2019    MM 3D SCREENING MAMMOGRAM BILATERAL BREAST  Result Date: 12/05/2022 CLINICAL DATA:  Screening. EXAM: DIGITAL SCREENING BILATERAL MAMMOGRAM WITH TOMOSYNTHESIS AND CAD TECHNIQUE: Bilateral screening digital craniocaudal and mediolateral oblique mammograms were obtained. Bilateral screening digital breast tomosynthesis was performed. The images were evaluated with computer-aided detection. COMPARISON:  Previous exam(s). ACR Breast Density Category b: There are scattered areas of fibroglandular density. FINDINGS: There are no findings suspicious for malignancy. IMPRESSION: No mammographic evidence of malignancy. A result letter of this screening mammogram will be mailed directly to the patient. RECOMMENDATION: Screening mammogram in one year. (Code:SM-B-01Y) BI-RADS CATEGORY  1: Negative. Electronically Signed  By: Sherron Ales M.D.   On: 12/05/2022 13:38       Assessment & Plan:  Hypercholesteremia Assessment & Plan: The 10-year ASCVD risk score (Arnett DK, et al., 2019) is: 6.7%   Values used to calculate  the score:     Age: 77 years     Sex: Female     Is Non-Hispanic African American: No     Diabetic: No     Tobacco smoker: No     Systolic Blood Pressure: 128 mmHg     Is BP treated: No     HDL Cholesterol: 79.4 mg/dL     Total Cholesterol: 182 mg/dL  Low cholesterol diet and exercise.  Follow lipid panel. Discussed calculated cholesterol risk - 6%.  Discussed calcium score.   Orders: -     Lipid panel; Future -     Hepatic function panel; Future -     Basic metabolic panel; Future -     TSH; Future  Need for influenza vaccination -     Flu Vaccine Trivalent High Dose (Fluad)  Generalized abdominal pain Assessment & Plan:  Has had extensive GI w/up as outlined in 10/02/22 note. Capsule endoscopy - negative 06/24/22. Colonoscopy 07/2022 - one 5 mm polyp in the transverse colon, friability with no bleeding in the proximal rectum and non bleeding external and internal hemorrhoids. EGD 12/10/22 - hiatal hernia and gastritis. Continues on protonix and carafate. MRCp 12/02/22 - no evidence of biliary dectal dilatation, choledocholithiasis or other acute finding. Still with intermittent flares despite above medication regimen. Still will have intermittent episodes of regurgitation of food.  Taking carafate prior to each meal.  Protonix and pepcid bid. Watching her diet. Has f/u in December.    Mild persistent asthma without complication Assessment & Plan: Had f/u with pulmonary 12/24/22 - recommended weaning off arnuity and use prn.  Has albuterol to use prn. Breathing stable.    Elevated alkaline phosphatase level Assessment & Plan: Seeing GI.   MRCP 12/02/22 - no evidence of biliary dectal dilatation, choledocholithiasis or other acute finding. Continue f/u with GI.  Stable.  Recent check 143.    Gastroesophageal reflux disease, unspecified whether esophagitis present Assessment & Plan: 12/10/22 - EGD - 4cm hiatal hernia, gastritis. Pathology - mild reactive gastropathy. Continue protonix,  pepcid and carafate.  Continue f/u with GI      [employer], MD

## 2023-02-04 ENCOUNTER — Encounter: Payer: Self-pay | Admitting: Internal Medicine

## 2023-03-02 ENCOUNTER — Ambulatory Visit: Payer: Medicare HMO | Admitting: Gastroenterology

## 2023-03-02 ENCOUNTER — Encounter: Payer: Self-pay | Admitting: Gastroenterology

## 2023-03-02 VITALS — BP 122/74 | HR 76 | Ht 63.0 in | Wt 192.1 lb

## 2023-03-02 DIAGNOSIS — K2100 Gastro-esophageal reflux disease with esophagitis, without bleeding: Secondary | ICD-10-CM | POA: Diagnosis not present

## 2023-03-02 DIAGNOSIS — K5792 Diverticulitis of intestine, part unspecified, without perforation or abscess without bleeding: Secondary | ICD-10-CM

## 2023-03-02 DIAGNOSIS — K449 Diaphragmatic hernia without obstruction or gangrene: Secondary | ICD-10-CM | POA: Diagnosis not present

## 2023-03-02 DIAGNOSIS — R1032 Left lower quadrant pain: Secondary | ICD-10-CM

## 2023-03-02 DIAGNOSIS — K760 Fatty (change of) liver, not elsewhere classified: Secondary | ICD-10-CM | POA: Diagnosis not present

## 2023-03-02 DIAGNOSIS — R748 Abnormal levels of other serum enzymes: Secondary | ICD-10-CM

## 2023-03-02 MED ORDER — AMOXICILLIN-POT CLAVULANATE 875-125 MG PO TABS: 1.0000 | ORAL_TABLET | Freq: Two times a day (BID) | ORAL | 0 refills | Status: DC

## 2023-03-02 MED ORDER — DICYCLOMINE HCL 10 MG PO CAPS: 10.0000 mg | ORAL_CAPSULE | Freq: Three times a day (TID) | ORAL | 6 refills | Status: AC | PRN

## 2023-03-02 MED ORDER — DEXLANSOPRAZOLE 60 MG PO CPDR: 60.0000 mg | DELAYED_RELEASE_CAPSULE | Freq: Every day | ORAL | 3 refills | Status: DC

## 2023-03-02 NOTE — Progress Notes (Unsigned)
Savannah Gomez    [id number]    Oct 07, 1955  Primary Care Physician:Scott, Westley Hummer, MD  Referring Physician: Dale [place], MD [address] [place],  [place] [address]   Chief complaint: Left lower quadrant abdominal pain, nausea, gas and bloating  Discussed the use of AI scribe software for clinical note transcription with the patient, who gave verbal consent to proceed.  History of Present Illness   The patient, with a history of hiatal hernia, gastroesophageal reflux disease (GERD), and fatty liver disease, presents with persistent symptoms of acid reflux. Despite current treatment with pantoprazole, Pepcid, and sulcarbate, she reports frequent nausea and regurgitation of food. The patient has been attempting to manage symptoms by eating smaller, more frequent meals and avoiding lying down within three to four hours of eating or drinking.  In addition to the GERD symptoms, the patient has been experiencing sharp, left-sided abdominal pain over the past few days. She describes the pain as similar to previous episodes of diverticulitis. The patient also reports infrequent bowel movements, occurring every couple of days, despite taking a daily dose of Miralax. She has experienced episodes of severe constipation, resulting in painful bowel movements.  The patient's alkaline phosphatase levels have been trending high over the past couple of years, which is a concern. However, a recent MRI was reassuring, showing no sludge in the gallbladder or pancreas and no tumors.  The patient's hiatal hernia has been causing significant daily discomfort, and despite being on multiple medications, the symptoms have not improved. The patient is considering surgical repair of the hernia.     MRCP December 02, 2022 Prior cholecystectomy. No evidence of biliary ductal dilatation, choledocholithiasis, or other acute findings.  EGD December 10, 2022 - Gastroesophageal  flap valve classified as Hill Grade IV ( no fold, wide open lumen, hiatal hernia present) . - 4 cm hiatal hernia. - Gastritis. Biopsied. - Nodular mucosa in the gastric antrum. Biopsied. - Normal examined duodenum.  Colonoscopy Jul 29, 2022 - One 5 mm polyp in the transverse colon, removed with a cold snare. Resected and retrieved. - Friability with no bleeding in the proximal rectum. Biopsied. Exclude sterocoral proctitis - Non- bleeding external and internal hemorrhoids.  Capsule endoscopy for iron deficiency anemia June 24, 2022 Negative  Outpatient Encounter Medications as of 03/02/2023  Medication Sig   acetaminophen (TYLENOL) 500 MG tablet Take 1,000 mg by mouth daily as needed for mild pain or headache.    albuterol (VENTOLIN HFA) 108 (90 Base) MCG/ACT inhaler Inhale 2 puffs into the lungs every 6 (six) hours as needed for wheezing or shortness of breath.   amoxicillin-clavulanate (AUGMENTIN) 875-125 MG tablet Take 1 tablet by mouth 2 (two) times daily.   dexlansoprazole (DEXILANT) 60 MG capsule Take 1 capsule (60 mg total) by mouth daily.   famotidine (PEPCID) 20 MG tablet TAKE 1 TABLET BY MOUTH TWICE A DAY   Fluticasone Furoate (ARNUITY ELLIPTA) 100 MCG/ACT AEPB Inhale 1 puff into the lungs daily.   hydrocortisone (ANUSOL-HC) 25 MG suppository Place 1 suppository (25 mg total) rectally every 12 (twelve) hours.   imipramine (TOFRANIL) 25 MG tablet Take 1 tablet (25 mg total) by mouth 2 (two) times daily.   pantoprazole (PROTONIX) 40 MG tablet Take 1 tablet (40 mg total) by mouth 2 (two) times daily before a meal.   sucralfate (CARAFATE) 1 GM/10ML suspension Take 10 mLs (1 g total) by mouth 4 (four) times daily -  with meals and at bedtime.   tolterodine (DETROL LA) 4 MG 24 hr capsule Take 1 capsule (4 mg total) by mouth daily.   topiramate (TOPAMAX) 25 MG tablet Take 1 tablet (25 mg total) by mouth 2 (two) times daily.   [DISCONTINUED] dicyclomine (BENTYL) 10 MG capsule Take 1 capsule (10  mg total) by mouth 3 (three) times daily as needed for spasms (for abdominal pain and cramping).   dicyclomine (BENTYL) 10 MG capsule Take 1 capsule (10 mg total) by mouth 3 (three) times daily as needed for spasms (for abdominal pain and cramping).   No facility-administered encounter medications on file as of 03/02/2023.    Allergies as of 03/02/2023 - Review Complete 03/02/2023  Allergen Reaction Noted   Demerol [meperidine] Nausea Only 01/16/2012    Past Medical History:  Diagnosis Date   Allergy    Arthritis    Asthma    Chicken pox    Complication of anesthesia    GERD (gastroesophageal reflux disease)    History of kidney stones    Migraine headache    PONV (postoperative nausea and vomiting)    Urine incontinence     Past Surgical History:  Procedure Laterality Date   BREAST CYST ASPIRATION Bilateral    neg   carpal tunnel sugery     bilateral   CHOLECYSTECTOMY N/A 11/04/2019   Procedure: LAPAROSCOPIC CHOLECYSTECTOMY WITH INTRAOPERATIVE CHOLANGIOGRAM;  Surgeon: Earline Mayotte, MD;  Location: [facility] ORS;  Service: General;  Laterality: N/A;   COLONOSCOPY WITH PROPOFOL N/A 09/20/2018   Procedure: COLONOSCOPY WITH PROPOFOL;  Surgeon: Scot Jun, MD;  Location: [facility] ENDOSCOPY;  Service: Endoscopy;  Laterality: N/A;   ELBOW SURGERY  1999   ESOPHAGOGASTRODUODENOSCOPY (EGD) WITH PROPOFOL N/A 09/20/2018   Procedure: ESOPHAGOGASTRODUODENOSCOPY (EGD) WITH PROPOFOL;  Surgeon: Scot Jun, MD;  Location: [facility] ENDOSCOPY;  Service: Endoscopy;  Laterality: N/A;   HAND SURGERY  1999   broken finger - pins   NASAL SINUS SURGERY  1992   ROTATOR CUFF REPAIR  1994   Trigger finger surgery  1999 and 2000   VAGINAL HYSTERECTOMY  1987   secondary to fibroids    Family History  Problem Relation Age of Onset   Colon cancer Mother        colon   Asthma Mother    Heart disease Father    Hypertension Father    Colon cancer Sister    Stroke Maternal Grandfather     Diabetes Maternal Grandfather    Lung cancer Maternal Grandmother    Breast cancer Maternal Grandmother    Esophageal cancer Neg Hx    Pancreatic cancer Neg Hx    Liver disease Neg Hx    Stomach cancer Neg Hx     Social History   Socioeconomic History   Marital status: Married    Spouse name: Not on file   Number of children: Not on file   Years of education: Not on file   Highest education level: 12th grade  Occupational History   Not on file  Tobacco Use   Smoking status: Never   Smokeless tobacco: Never  Vaping Use   Vaping status: Never Used  Substance and Sexual Activity   Alcohol use: No   Drug use: No   Sexual activity: Yes    Birth control/protection: None, Post-menopausal  Other Topics Concern   Not on file  Social History Narrative   Not on file   Social Determinants of Corporate investment banker  Strain: Low Risk  (01/30/2023)   Overall Financial Resource Strain (CARDIA)    Difficulty of Paying Living Expenses: Not hard at all  Food Insecurity: No Food Insecurity (01/30/2023)   Hunger Vital Sign    Worried About Running Out of Food in the Last Year: Never true    Ran Out of Food in the Last Year: Never true  Transportation Needs: No Transportation Needs (01/30/2023)   PRAPARE - Administrator, Civil Service (Medical): No    Lack of Transportation (Non-Medical): No  Physical Activity: Insufficiently Active (01/30/2023)   Exercise Vital Sign    Days of Exercise per Week: 3 days    Minutes of Exercise per Session: 20 min  Stress: No Stress Concern Present (01/30/2023)   [employer] of Occupational Health - Occupational Stress Questionnaire    Feeling of Stress : Only a little  Social Connections: Socially Integrated (01/30/2023)   Social Connection and Isolation Panel [NHANES]    Frequency of Communication with Friends and Family: Three times a week    Frequency of Social Gatherings with Friends and Family: Once a week    Attends Religious  Services: More than 4 times per year    Active Member of [employer] or Organizations: Yes    Attends Engineer, structural: More than 4 times per year    Marital Status: Married  Catering manager Violence: Not At Risk (07/31/2022)   Humiliation, Afraid, Rape, and Kick questionnaire    Fear of Current or Ex-Partner: No    Emotionally Abused: No    Physically Abused: No    Sexually Abused: No      Review of systems: All other review of systems negative except as mentioned in the HPI.   Physical Exam: Vitals:   03/02/23 1538  BP: 122/74  Pulse: 76  SpO2: 99%   Body mass index is 34.03 kg/m. Gen:      No acute distress HEENT:  sclera anicteric CV: s1s2 rrr, no murmur Lungs: B/l clear. Abd:      soft, non-tender; no palpable masses, no distension Ext:    No edema Neuro: alert and oriented x 3 Psych: normal mood and affect  Data Reviewed:  Reviewed labs, radiology imaging, old records and pertinent past GI work up     Assessment and Plan    Hiatal Hernia Persistent regurgitation despite current medications (Pantoprazole, Pepcid, Sulcarbate). Discussed the mechanical nature of the problem with persistent reflux secondary to hiatal hernia and the limitations of medical management. -Replace Pantoprazole with Dexilant 60 mg daily, a stronger acid reducing medication. -Refer to surgeon (Dr. Andrey Campanile) for consideration of hernia repair. -Order esophageal manometry to exclude any significant esophageal dysmotility prior to potential surgery.  Elevated alkaline phosphatase Persistent elevation over the last couple of years. MRI reassuring with no evidence of tumor or sludge in the gallbladder or pancreas. -Repeat LFT in January 2025 to monitor trend.  Fatty Liver Discussed the potential contribution to elevated alk phos and the importance of dietary modifications. -Continue small portion meals, avoid sugars, soda, and sweet tea.  Abdominal Pain Recent onset of sharp pain  on the left side, possibly indicative of diverticulitis. -Provide antibiotics, Augmentin 875 mg twice daily for 7 days to be taken if pain worsens.  Call with any change in symptoms -Increase Miralax to half a capful in the evening in addition to the morning dose.  Follow-up After completion of esophageal manometry and consultation with Dr. Andrey Campanile, likely in February or March  2025.        The patient was provided an opportunity to ask questions and all were answered. The patient agreed with the plan and demonstrated an understanding of the instructions.  Iona Beard , MD    CC: Dale [place], MD

## 2023-03-02 NOTE — Patient Instructions (Addendum)
VISIT SUMMARY:  During today's visit, we discussed your ongoing symptoms of acid reflux and abdominal pain. We reviewed your current medications and made some adjustments to better manage your conditions. We also talked about the possibility of surgical intervention for your hiatal hernia and planned some follow-up tests and consultations.  YOUR PLAN:  -HIATAL HERNIA: A hiatal hernia occurs when part of the stomach pushes up through the diaphragm. We will replace your current medication, Pantoprazole, with Dexilant 60mg  daily , which is a stronger acid-reducing medication. Additionally, we will refer you to Dr. Andrey Campanile, a surgeon, to discuss the possibility of hernia repair. An esophageal manometry test will be ordered to check the muscle function of your esophagus before any potential surgery.  -ELEVATED ALK Phos: Although your recent MRI showed no tumors or sludge, we will repeat the liver test in January 2025 to monitor the trend.  -FATTY LIVER: Fatty liver disease is a condition where fat builds up in the liver. We discussed the importance of dietary modifications, such as eating small portion meals and avoiding sugars, soda, and sweet tea, to help manage this condition.  -ABDOMINAL PAIN: Your recent sharp left-sided abdominal pain may be related to diverticulitis, an inflammation or infection of small pouches in the digestive tract. We will provide antibiotics , Augmentin 875 mg BID X 7 days for you to take if the pain worsens. Additionally, you should increase your Miralax dose to half a capful in the evening along with your morning dose to help with bowel movements.  You have been scheduled for an esophageal manometry at [facility] on 07/22/2023 at 8:30am. Please arrive 30 minutes prior to your procedure for registration. You will need to go to outpatient registration (1st floor of the hospital) first. Make certain to bring your insurance cards as well as a complete list of  medications.  Please remember the following:  1) Do not take any muscle relaxants, xanax (alprazolam) or ativan for 1 day prior to your test as well as the day of the test.  2) Nothing to eat or drink for 8 hours before your test.  3) Hold all diabetic medications/insulin the morning of the test. You may eat and take your medications after the test.  It will take at least 2 weeks to receive the results of this test from your physician. ------------------------------------------ ABOUT ESOPHAGEAL MANOMETRY Esophageal manometry (muh-NOM-uh-tree) is a test that gauges how well your esophagus works. Your esophagus is the long, muscular tube that connects your throat to your stomach. Esophageal manometry measures the rhythmic muscle contractions (peristalsis) that occur in your esophagus when you swallow. Esophageal manometry also measures the coordination and force exerted by the muscles of your esophagus.  During esophageal manometry, a thin, flexible tube (catheter) that contains sensors is passed through your nose, down your esophagus and into your stomach. Esophageal manometry can be helpful in diagnosing some mostly uncommon disorders that affect your esophagus.  Why it's done Esophageal manometry is used to evaluate the movement (motility) of food through the esophagus and into the stomach. The test measures how well the circular bands of muscle (sphincters) at the top and bottom of your esophagus open and close, as well as the pressure, strength and pattern of the wave of esophageal muscle contractions that moves food along.  What you can expect Esophageal manometry is an outpatient procedure done without sedation. Most people tolerate it well. You may be asked to change into a hospital gown before the test starts.  During esophageal manometry  While you are sitting up, a member of your health care team sprays your throat with a numbing medication or puts numbing gel in your nose or both.  A  catheter is guided through your nose into your esophagus. The catheter may be sheathed in a water-filled sleeve. It doesn't interfere with your breathing. However, your eyes may water, and you may gag. You may have a slight nosebleed from irritation.  After the catheter is in place, you may be asked to lie on your back on an exam table, or you may be asked to remain seated.  You then swallow small sips of water. As you do, a computer connected to the catheter records the pressure, strength and pattern of your esophageal muscle contractions.  During the test, you'll be asked to breathe slowly and smoothly, remain as still as possible, and swallow only when you're asked to do so.  A member of your health care team may move the catheter down into your stomach while the catheter continues its measurements.  The catheter then is slowly withdrawn. The test usually lasts 20 to 30 minutes.  After esophageal manometry  When your esophageal manometry is complete, you may return to your normal activities  This test typically takes 30-45 minutes to complete. ________________________________________________________________________________   INSTRUCTIONS:  Please follow up after completing the esophageal manometry and your consultation with Dr. Andrey Campanile, likely in February or March 2025.  I appreciate the  opportunity to care for you  Thank You   Marsa Aris , MD

## 2023-03-04 ENCOUNTER — Encounter: Payer: Self-pay | Admitting: Gastroenterology

## 2023-03-06 ENCOUNTER — Other Ambulatory Visit: Payer: Self-pay | Admitting: *Deleted

## 2023-03-06 MED ORDER — FAMOTIDINE 20 MG PO TABS: 20.0000 mg | ORAL_TABLET | Freq: Two times a day (BID) | ORAL | 11 refills | Status: DC

## 2023-04-16 ENCOUNTER — Ambulatory Visit: Payer: Medicare HMO | Admitting: Gastroenterology

## 2023-04-16 DIAGNOSIS — H40013 Open angle with borderline findings, low risk, bilateral: Secondary | ICD-10-CM | POA: Diagnosis not present

## 2023-04-16 DIAGNOSIS — H04123 Dry eye syndrome of bilateral lacrimal glands: Secondary | ICD-10-CM | POA: Diagnosis not present

## 2023-06-01 ENCOUNTER — Other Ambulatory Visit (INDEPENDENT_AMBULATORY_CARE_PROVIDER_SITE_OTHER): Payer: Medicare HMO

## 2023-06-01 DIAGNOSIS — E7800 Pure hypercholesterolemia, unspecified: Secondary | ICD-10-CM | POA: Diagnosis not present

## 2023-06-01 LAB — HEPATIC FUNCTION PANEL
ALT: 23 U/L (ref 0–35)
AST: 21 U/L (ref 0–37)
Albumin: 4.3 g/dL (ref 3.5–5.2)
Alkaline Phosphatase: 123 U/L — ABNORMAL HIGH (ref 39–117)
Bilirubin, Direct: 0.1 mg/dL (ref 0.0–0.3)
Total Bilirubin: 0.5 mg/dL (ref 0.2–1.2)
Total Protein: 6.5 g/dL (ref 6.0–8.3)

## 2023-06-01 LAB — BASIC METABOLIC PANEL
BUN: 11 mg/dL (ref 6–23)
CO2: 26 meq/L (ref 19–32)
Calcium: 9.5 mg/dL (ref 8.4–10.5)
Chloride: 106 meq/L (ref 96–112)
Creatinine, Ser: 0.88 mg/dL (ref 0.40–1.20)
GFR: 67.31 mL/min (ref >60.00)
Glucose, Bld: 97 mg/dL (ref 70–99)
Potassium: 4.3 meq/L (ref 3.5–5.1)
Sodium: 141 meq/L (ref 135–145)

## 2023-06-01 LAB — LIPID PANEL
Cholesterol: 183 mg/dL (ref 0–200)
HDL: 81.7 mg/dL (ref >39.00)
LDL Cholesterol: 87 mg/dL (ref 0–99)
NonHDL: 101.52
Total CHOL/HDL Ratio: 2
Triglycerides: 75 mg/dL (ref 0.0–149.0)
VLDL: 15 mg/dL (ref 0.0–40.0)

## 2023-06-01 LAB — TSH: TSH: 2.45 u[IU]/mL (ref 0.35–5.50)

## 2023-06-03 ENCOUNTER — Ambulatory Visit (INDEPENDENT_AMBULATORY_CARE_PROVIDER_SITE_OTHER): Payer: Medicare HMO | Admitting: Internal Medicine

## 2023-06-03 VITALS — BP 126/70 | HR 88 | Temp 98.0°F | Resp 16 | Ht 63.0 in | Wt 191.0 lb

## 2023-06-03 DIAGNOSIS — R748 Abnormal levels of other serum enzymes: Secondary | ICD-10-CM | POA: Diagnosis not present

## 2023-06-03 DIAGNOSIS — K219 Gastro-esophageal reflux disease without esophagitis: Secondary | ICD-10-CM | POA: Diagnosis not present

## 2023-06-03 DIAGNOSIS — E7800 Pure hypercholesterolemia, unspecified: Secondary | ICD-10-CM

## 2023-06-03 DIAGNOSIS — Z8349 Family history of other endocrine, nutritional and metabolic diseases: Secondary | ICD-10-CM | POA: Diagnosis not present

## 2023-06-03 DIAGNOSIS — R1084 Generalized abdominal pain: Secondary | ICD-10-CM | POA: Diagnosis not present

## 2023-06-03 DIAGNOSIS — J4530 Mild persistent asthma, uncomplicated: Secondary | ICD-10-CM

## 2023-06-03 MED ORDER — TOPIRAMATE 25 MG PO TABS: 25.0000 mg | ORAL_TABLET | Freq: Two times a day (BID) | ORAL | 1 refills | Status: DC

## 2023-06-03 NOTE — Assessment & Plan Note (Addendum)
 The 10-year ASCVD risk score (Arnett DK, et al., 2019) is: 6.1%   Values used to calculate the score:     Age: 68 years     Sex: Female     Is Non-Hispanic African American: No     Diabetic: No     Tobacco smoker: No     Systolic Blood Pressure: 122 mmHg     Is BP treated: No     HDL Cholesterol: 81.7 mg/dL     Total Cholesterol: 183 mg/dL  Low cholesterol diet and exercise.  Follow lipid panel. Discussed calculated cholesterol risk - 6%.  Have previously discussed calcium score.  Continue diet and exercise.

## 2023-06-03 NOTE — Progress Notes (Signed)
 Subjective:    Patient ID: Savannah Gomez, female    DOB: Feb 05, 1956, 68 y.o.   MRN: [id number]  Patient here for a scheduled follow up.   HPI Here for a scheduled follow up - f/u regarding persistent GI issues and hypercholesterolemia.  Has had extensive GI w/up as outlined in 10/02/22 note. Capsule endoscopy - negative 06/24/22. Colonoscopy 07/2022 - one 5 mm polyp in the transverse colon, friability with no bleeding in the proximal rectum and non bleeding external and internal hemorrhoids. EGD 12/10/22 - hiatal hernia and gastritis. MRCP 12/02/22 - no evidence of biliary dectal dilatation, choledocholithiasis or other acute finding. Had f/u with GI 03/02/23 - persistent nausea and regurgitation of food despite protonix, pepcid and carafate. Also reported left side abdominal pain. GI recommended changing protonix to dexilant and referral to Dr Andrey Campanile for consideration of hernia repair.  Also recommended esophageal manometry (scheduled for 07/22/23). Since being on dexilant - feels acid reflux is better. Not taking pepcid and carafate regularly now. Has only needed pepcid 2x recently. Breathing stable. Handling stress.    Past Medical History:  Diagnosis Date   Allergy    Arthritis    Asthma    Chicken pox    Complication of anesthesia    GERD (gastroesophageal reflux disease)    History of kidney stones    Migraine headache    PONV (postoperative nausea and vomiting)    Urine incontinence    Past Surgical History:  Procedure Laterality Date   BREAST CYST ASPIRATION Bilateral    neg   carpal tunnel sugery     bilateral   CHOLECYSTECTOMY N/A 11/04/2019   Procedure: LAPAROSCOPIC CHOLECYSTECTOMY WITH INTRAOPERATIVE CHOLANGIOGRAM;  Surgeon: Earline Mayotte, MD;  Location: [facility] ORS;  Service: General;  Laterality: N/A;   COLONOSCOPY WITH PROPOFOL N/A 09/20/2018   Procedure: COLONOSCOPY WITH PROPOFOL;  Surgeon: Scot Jun, MD;  Location: [facility] ENDOSCOPY;  Service: Endoscopy;   Laterality: N/A;   ELBOW SURGERY  1999   ESOPHAGOGASTRODUODENOSCOPY (EGD) WITH PROPOFOL N/A 09/20/2018   Procedure: ESOPHAGOGASTRODUODENOSCOPY (EGD) WITH PROPOFOL;  Surgeon: Scot Jun, MD;  Location: [facility] ENDOSCOPY;  Service: Endoscopy;  Laterality: N/A;   HAND SURGERY  1999   broken finger - pins   NASAL SINUS SURGERY  1992   ROTATOR CUFF REPAIR  1994   Trigger finger surgery  1999 and 2000   VAGINAL HYSTERECTOMY  1987   secondary to fibroids   Family History  Problem Relation Age of Onset   Colon cancer Mother        colon   Asthma Mother    Heart disease Father    Hypertension Father    Colon cancer Sister    Stroke Maternal Grandfather    Diabetes Maternal Grandfather    Lung cancer Maternal Grandmother    Breast cancer Maternal Grandmother    Esophageal cancer Neg Hx    Pancreatic cancer Neg Hx    Liver disease Neg Hx    Stomach cancer Neg Hx    Social History   Socioeconomic History   Marital status: Married    Spouse name: Not on file   Number of children: Not on file   Years of education: Not on file   Highest education level: 12th grade  Occupational History   Not on file  Tobacco Use   Smoking status: Never   Smokeless tobacco: Never  Vaping Use   Vaping status: Never Used  Substance and Sexual Activity   Alcohol  use: No   Drug use: No   Sexual activity: Yes    Birth control/protection: None, Post-menopausal  Other Topics Concern   Not on file  Social History Narrative   Not on file   Social Drivers of Health   Financial Resource Strain: Low Risk  (05/31/2023)   Overall Financial Resource Strain (CARDIA)    Difficulty of Paying Living Expenses: Not hard at all  Food Insecurity: No Food Insecurity (05/31/2023)   Hunger Vital Sign    Worried About Running Out of Food in the Last Year: Never true    Ran Out of Food in the Last Year: Never true  Transportation Needs: No Transportation Needs (05/31/2023)   PRAPARE - Scientist, research (physical sciences) (Medical): No    Lack of Transportation (Non-Medical): No  Physical Activity: Insufficiently Active (05/31/2023)   Exercise Vital Sign    Days of Exercise per Week: 3 days    Minutes of Exercise per Session: 20 min  Stress: No Stress Concern Present (05/31/2023)   [employer] of Occupational Health - Occupational Stress Questionnaire    Feeling of Stress : Only a little  Social Connections: Socially Integrated (05/31/2023)   Social Connection and Isolation Panel [NHANES]    Frequency of Communication with Friends and Family: More than three times a week    Frequency of Social Gatherings with Friends and Family: Once a week    Attends Religious Services: More than 4 times per year    Active Member of [employer] or Organizations: Yes    Attends Engineer, structural: More than 4 times per year    Marital Status: Married     Review of Systems  Constitutional:  Negative for appetite change and unexpected weight change.  HENT:  Negative for congestion and sinus pressure.   Respiratory:  Negative for cough, chest tightness and shortness of breath.   Cardiovascular:  Negative for chest pain, palpitations and leg swelling.  Gastrointestinal:        Persistent GI flares as outlined. Acid reflux doing better on dexilant.   Genitourinary:  Negative for difficulty urinating and dysuria.  Musculoskeletal:  Negative for joint swelling and myalgias.  Skin:  Negative for color change and rash.  Neurological:  Negative for dizziness and headaches.  Psychiatric/Behavioral:  Negative for agitation and dysphoric mood.        Objective:     BP 126/70   Pulse 88   Temp 98 F (36.7 C)   Resp 16   Ht 5\' 3"  (1.6 m)   Wt 191 lb (86.6 kg)   SpO2 99%   BMI 33.83 kg/m  Wt Readings from Last 3 Encounters:  06/03/23 191 lb (86.6 kg)  03/02/23 192 lb 2 oz (87.1 kg)  02/03/23 193 lb (87.5 kg)    Physical Exam Vitals reviewed.  Constitutional:      General: She is not in  acute distress.    Appearance: Normal appearance.  HENT:     Head: Normocephalic and atraumatic.     Right Ear: External ear normal.     Left Ear: External ear normal.     Mouth/Throat:     Pharynx: No oropharyngeal exudate or posterior oropharyngeal erythema.  Eyes:     General: No scleral icterus.       Right eye: No discharge.        Left eye: No discharge.     Conjunctiva/sclera: Conjunctivae normal.  Neck:     Thyroid: No  thyromegaly.  Cardiovascular:     Rate and Rhythm: Normal rate and regular rhythm.  Pulmonary:     Effort: No respiratory distress.     Breath sounds: Normal breath sounds. No wheezing.  Abdominal:     General: Bowel sounds are normal.     Palpations: Abdomen is soft.     Tenderness: There is no abdominal tenderness.  Musculoskeletal:        General: No swelling or tenderness.     Cervical back: Neck supple. No tenderness.  Lymphadenopathy:     Cervical: No cervical adenopathy.  Skin:    Findings: No erythema or rash.  Neurological:     Mental Status: She is alert.  Psychiatric:        Mood and Affect: Mood normal.        Behavior: Behavior normal.         Outpatient Encounter Medications as of 06/03/2023  Medication Sig   acetaminophen (TYLENOL) 500 MG tablet Take 1,000 mg by mouth daily as needed for mild pain or headache.    albuterol (VENTOLIN HFA) 108 (90 Base) MCG/ACT inhaler Inhale 2 puffs into the lungs every 6 (six) hours as needed for wheezing or shortness of breath.   amoxicillin-clavulanate (AUGMENTIN) 875-125 MG tablet Take 1 tablet by mouth 2 (two) times daily.   dexlansoprazole (DEXILANT) 60 MG capsule Take 1 capsule (60 mg total) by mouth daily.   dicyclomine (BENTYL) 10 MG capsule Take 1 capsule (10 mg total) by mouth 3 (three) times daily as needed for spasms (for abdominal pain and cramping).   famotidine (PEPCID) 20 MG tablet Take 1 tablet (20 mg total) by mouth 2 (two) times daily.   Fluticasone Furoate (ARNUITY ELLIPTA) 100  MCG/ACT AEPB Inhale 1 puff into the lungs daily.   hydrocortisone (ANUSOL-HC) 25 MG suppository Place 1 suppository (25 mg total) rectally every 12 (twelve) hours.   imipramine (TOFRANIL) 25 MG tablet Take 1 tablet (25 mg total) by mouth 2 (two) times daily.   sucralfate (CARAFATE) 1 GM/10ML suspension Take 10 mLs (1 g total) by mouth 4 (four) times daily -  with meals and at bedtime.   tolterodine (DETROL LA) 4 MG 24 hr capsule Take 1 capsule (4 mg total) by mouth daily.   [DISCONTINUED] topiramate (TOPAMAX) 25 MG tablet Take 1 tablet (25 mg total) by mouth 2 (two) times daily.   topiramate (TOPAMAX) 25 MG tablet Take 1 tablet (25 mg total) by mouth 2 (two) times daily.   No facility-administered encounter medications on file as of 06/03/2023.     Lab Results  Component Value Date   WBC 4.3 01/30/2023   HGB 13.7 01/30/2023   HCT 40.7 01/30/2023   PLT 250.0 01/30/2023   GLUCOSE 97 06/01/2023   CHOL 183 06/01/2023   TRIG 75.0 06/01/2023   HDL 81.70 06/01/2023   LDLCALC 87 06/01/2023   ALT 23 06/01/2023   AST 21 06/01/2023   NA 141 06/01/2023   K 4.3 06/01/2023   CL 106 06/01/2023   CREATININE 0.88 06/01/2023   BUN 11 06/01/2023   CO2 26 06/01/2023   TSH 2.45 06/01/2023   INR 1.0 11/26/2022   HGBA1C 5.4 10/03/2019    MM 3D SCREENING MAMMOGRAM BILATERAL BREAST Result Date: 12/05/2022 CLINICAL DATA:  Screening. EXAM: DIGITAL SCREENING BILATERAL MAMMOGRAM WITH TOMOSYNTHESIS AND CAD TECHNIQUE: Bilateral screening digital craniocaudal and mediolateral oblique mammograms were obtained. Bilateral screening digital breast tomosynthesis was performed. The images were evaluated with computer-aided detection. COMPARISON:  Previous exam(s). ACR Breast Density Category b: There are scattered areas of fibroglandular density. FINDINGS: There are no findings suspicious for malignancy. IMPRESSION: No mammographic evidence of malignancy. A result letter of this screening mammogram will be mailed  directly to the patient. RECOMMENDATION: Screening mammogram in one year. (Code:SM-B-01Y) BI-RADS CATEGORY  1: Negative. Electronically Signed   By: Sherron Ales M.D.   On: 12/05/2022 13:38       Assessment & Plan:  Hypercholesteremia Assessment & Plan: The 10-year ASCVD risk score (Arnett DK, et al., 2019) is: 6.1%   Values used to calculate the score:     Age: 57 years     Sex: Female     Is Non-Hispanic African American: No     Diabetic: No     Tobacco smoker: No     Systolic Blood Pressure: 122 mmHg     Is BP treated: No     HDL Cholesterol: 81.7 mg/dL     Total Cholesterol: 183 mg/dL  Low cholesterol diet and exercise.  Follow lipid panel. Discussed calculated cholesterol risk - 6%.  Have previously discussed calcium score.  Continue diet and exercise.   Orders: -     Lipid panel; Future -     Hepatic function panel; Future -     Basic metabolic panel; Future  Elevated alkaline phosphatase level Assessment & Plan: Seeing GI.   MRCP 12/02/22 - no evidence of biliary dectal dilatation, choledocholithiasis or other acute finding. Continue f/u with GI.  Stable.  Recent check 123. Forward to GI.   Orders: -     Topiramate; Take 1 tablet (25 mg total) by mouth 2 (two) times daily.  Dispense: 180 tablet; Refill: 1  Gastroesophageal reflux disease, unspecified whether esophagitis present Assessment & Plan: 12/10/22 - EGD - 4cm hiatal hernia, gastritis. Pathology - mild reactive gastropathy.  Now on dexilant. Helping acid reflux. Off pepcid and carafate.    Family history of hemochromatosis Assessment & Plan: Mother with hemochromatosis.  Follow iron studies.    Mild persistent asthma without complication Assessment & Plan: Breathing stable. Has albuterol to use prn. Follow.    Generalized abdominal pain Assessment & Plan:  Has had extensive GI w/up as outlined in 10/02/22 note. Capsule endoscopy - negative 06/24/22. Colonoscopy 07/2022 - one 5 mm polyp in the transverse colon,  friability with no bleeding in the proximal rectum and non bleeding external and internal hemorrhoids. EGD 12/10/22 - hiatal hernia and gastritis. MRCP 12/02/22 - no evidence of biliary dectal dilatation, choledocholithiasis or other acute finding. Still with intermittent flares despite above medication regimen. Still will have intermittent episodes as outlined. On dexilant. Helping acid reflux. Also recommended a referral to Dr Andrey Campanile for consideration of hernia repair.  Also recommended esophageal manometry (scheduled for 07/22/23).       Dale [place], MD

## 2023-06-06 ENCOUNTER — Encounter: Payer: Self-pay | Admitting: Internal Medicine

## 2023-06-06 NOTE — Assessment & Plan Note (Signed)
 12/10/22 - EGD - 4cm hiatal hernia, gastritis. Pathology - mild reactive gastropathy.  Now on dexilant. Helping acid reflux. Off pepcid and carafate.

## 2023-06-06 NOTE — Assessment & Plan Note (Signed)
 Mother with hemochromatosis.  Follow iron studies.

## 2023-06-06 NOTE — Assessment & Plan Note (Signed)
 Has had extensive GI w/up as outlined in 10/02/22 note. Capsule endoscopy - negative 06/24/22. Colonoscopy 07/2022 - one 5 mm polyp in the transverse colon, friability with no bleeding in the proximal rectum and non bleeding external and internal hemorrhoids. EGD 12/10/22 - hiatal hernia and gastritis. MRCP 12/02/22 - no evidence of biliary dectal dilatation, choledocholithiasis or other acute finding. Still with intermittent flares despite above medication regimen. Still will have intermittent episodes as outlined. On dexilant. Helping acid reflux. Also recommended a referral to Dr Andrey Campanile for consideration of hernia repair.  Also recommended esophageal manometry (scheduled for 07/22/23).

## 2023-06-06 NOTE — Assessment & Plan Note (Signed)
Breathing stable.  Has albuterol to use prn.  Follow.  

## 2023-06-06 NOTE — Assessment & Plan Note (Signed)
 Seeing GI.   MRCP 12/02/22 - no evidence of biliary dectal dilatation, choledocholithiasis or other acute finding. Continue f/u with GI.  Stable.  Recent check 123. Forward to GI.

## 2023-07-22 ENCOUNTER — Ambulatory Visit (HOSPITAL_COMMUNITY)
Admission: RE | Admit: 2023-07-22 | Discharge: 2023-07-22 | Disposition: A | Discharge status: Home / Self Care | Payer: Medicare HMO | Attending: Gastroenterology | Admitting: Gastroenterology

## 2023-07-22 ENCOUNTER — Encounter (HOSPITAL_COMMUNITY)
Admission: RE | Disposition: A | Discharge status: Home / Self Care | Payer: Self-pay | Source: Home / Self Care | Attending: Gastroenterology

## 2023-07-22 ENCOUNTER — Encounter (HOSPITAL_COMMUNITY): Payer: Self-pay | Admitting: Gastroenterology

## 2023-07-22 DIAGNOSIS — K449 Diaphragmatic hernia without obstruction or gangrene: Secondary | ICD-10-CM

## 2023-07-22 DIAGNOSIS — K219 Gastro-esophageal reflux disease without esophagitis: Secondary | ICD-10-CM

## 2023-07-22 DIAGNOSIS — R1310 Dysphagia, unspecified: Secondary | ICD-10-CM

## 2023-07-22 HISTORY — PX: ESOPHAGEAL MANOMETRY: SHX5429

## 2023-07-22 SURGERY — MANOMETRY, ESOPHAGUS

## 2023-07-22 MED ORDER — LIDOCAINE VISCOUS HCL 2 % MT SOLN
OROMUCOSAL | Status: AC
  Filled 2023-07-22: qty 15

## 2023-07-22 SURGICAL SUPPLY — 2 items
FACESHIELD LNG OPTICON STERILE (SAFETY) IMPLANT
GLOVE BIO SURGEON STRL SZ8 (GLOVE) ×2 IMPLANT

## 2023-07-22 NOTE — Progress Notes (Signed)
 Esophageal manometry performed per protocol.  Patient tolerated well.

## 2023-07-23 ENCOUNTER — Encounter (HOSPITAL_COMMUNITY): Payer: Self-pay | Admitting: Gastroenterology

## 2023-08-06 ENCOUNTER — Encounter: Payer: Self-pay | Admitting: Gastroenterology

## 2023-08-24 ENCOUNTER — Ambulatory Visit (INDEPENDENT_AMBULATORY_CARE_PROVIDER_SITE_OTHER): Admitting: *Deleted

## 2023-08-24 VITALS — Ht 63.0 in | Wt 190.0 lb

## 2023-08-24 DIAGNOSIS — Z0000 Encounter for general adult medical examination without abnormal findings: Secondary | ICD-10-CM

## 2023-08-24 NOTE — Progress Notes (Signed)
 Subjective:   Savannah Gomez is a 68 y.o. who presents for a Medicare Wellness preventive visit.  As a reminder, Annual Wellness Visits don't include a physical exam, and some assessments may be limited, especially if this visit is performed virtually. We may recommend an in-person follow-up visit with your provider if needed.  Visit Complete: Virtual I connected with  Savannah Gomez on 08/24/23 by a video and audio enabled telemedicine application and verified that I am speaking with the correct person using two identifiers.  Patient Location: Home  Provider Location: Home Office  I discussed the limitations of evaluation and management by telemedicine. The patient expressed understanding and agreed to proceed.  Vital Signs: Because this visit was a virtual/telehealth visit, some criteria may be missing or patient reported. Any vitals not documented were not able to be obtained and vitals that have been documented are patient reported.   Persons Participating in Visit: Patient.  AWV Questionnaire: No: Patient Medicare AWV questionnaire was not completed prior to this visit.  Cardiac Risk Factors include: advanced age (>14men, >74 women);obesity (BMI >30kg/m2);dyslipidemia     Objective:     Today's Vitals   08/24/23 0925  Weight: 190 lb (86.2 kg)  Height: 5\' 3"  (1.6 m)   Body mass index is 33.66 kg/m.     08/24/2023    9:38 AM 07/31/2022    4:03 PM 09/16/2021   12:43 PM 09/14/2020    8:27 AM 11/04/2019   10:22 AM 10/26/2019    9:05 AM 09/20/2018    8:25 AM  Advanced Directives  Does Patient Have a Medical Advance Directive? Yes Yes Yes Yes Yes Yes Yes  Type of Estate agent of [place];Living will Healthcare Power of [place];Living will Healthcare Power of [place];Living will Healthcare Power of [place];Living will Healthcare Power of [place];Living will Healthcare Power of [place];Living will Healthcare Power of [place];Living will  Does  patient want to make changes to medical advance directive?   No - Patient declined No - Patient declined No - Patient declined    Copy of Healthcare Power of Attorney in Chart? No - copy requested No - copy requested No - copy requested No - copy requested Yes - validated most recent copy scanned in chart (See row information)  No - copy requested    Current Medications (verified) Outpatient Encounter Medications as of 08/24/2023  Medication Sig   acetaminophen  (TYLENOL ) 500 MG tablet Take 1,000 mg by mouth daily as needed for mild pain or headache.    albuterol  (VENTOLIN  HFA) 108 (90 Base) MCG/ACT inhaler Inhale 2 puffs into the lungs every 6 (six) hours as needed for wheezing or shortness of breath.   dexlansoprazole  (DEXILANT ) 60 MG capsule Take 1 capsule (60 mg total) by mouth daily.   dicyclomine  (BENTYL ) 10 MG capsule Take 1 capsule (10 mg total) by mouth 3 (three) times daily as needed for spasms (for abdominal pain and cramping).   famotidine  (PEPCID ) 20 MG tablet Take 1 tablet (20 mg total) by mouth 2 (two) times daily.   Fluticasone  Furoate (ARNUITY ELLIPTA ) 100 MCG/ACT AEPB Inhale 1 puff into the lungs daily.   hydrocortisone  (ANUSOL -HC) 25 MG suppository Place 1 suppository (25 mg total) rectally every 12 (twelve) hours.   imipramine  (TOFRANIL ) 25 MG tablet Take 1 tablet (25 mg total) by mouth 2 (two) times daily.   sucralfate  (CARAFATE ) 1 GM/10ML suspension Take 10 mLs (1 g total) by mouth 4 (four) times daily -  with meals and  at bedtime.   tolterodine  (DETROL  LA) 4 MG 24 hr capsule Take 1 capsule (4 mg total) by mouth daily.   topiramate  (TOPAMAX ) 25 MG tablet Take 1 tablet (25 mg total) by mouth 2 (two) times daily.   [DISCONTINUED] amoxicillin -clavulanate (AUGMENTIN ) 875-125 MG tablet Take 1 tablet by mouth 2 (two) times daily. (Patient not taking: Reported on 08/24/2023)   No facility-administered encounter medications on file as of 08/24/2023.    Allergies (verified) Demerol  [meperidine]   History: Past Medical History:  Diagnosis Date   Allergy    Arthritis    Asthma    Chicken pox    Complication of anesthesia    GERD (gastroesophageal reflux disease)    History of kidney stones    Migraine headache    PONV (postoperative nausea and vomiting)    Urine incontinence    Past Surgical History:  Procedure Laterality Date   BREAST CYST ASPIRATION Bilateral    neg   carpal tunnel sugery     bilateral   CHOLECYSTECTOMY N/A 11/04/2019   Procedure: LAPAROSCOPIC CHOLECYSTECTOMY WITH INTRAOPERATIVE CHOLANGIOGRAM;  Surgeon: Marshall Skeeter, MD;  Location: [facility] ORS;  Service: General;  Laterality: N/A;   COLONOSCOPY WITH PROPOFOL  N/A 09/20/2018   Procedure: COLONOSCOPY WITH PROPOFOL ;  Surgeon: Cassie Click, MD;  Location: [facility] ENDOSCOPY;  Service: Endoscopy;  Laterality: N/A;   ELBOW SURGERY  1999   ESOPHAGEAL MANOMETRY N/A 07/22/2023   Procedure: MANOMETRY, ESOPHAGUS;  Surgeon: Sergio Dandy, MD;  Location: [facility] ENDOSCOPY;  Service: Gastroenterology;  Laterality: N/A;   ESOPHAGOGASTRODUODENOSCOPY (EGD) WITH PROPOFOL  N/A 09/20/2018   Procedure: ESOPHAGOGASTRODUODENOSCOPY (EGD) WITH PROPOFOL ;  Surgeon: Cassie Click, MD;  Location: [facility] ENDOSCOPY;  Service: Endoscopy;  Laterality: N/A;   HAND SURGERY  1999   broken finger - pins   NASAL SINUS SURGERY  1992   ROTATOR CUFF REPAIR  1994   Trigger finger surgery  1999 and 2000   VAGINAL HYSTERECTOMY  1987   secondary to fibroids   Family History  Problem Relation Age of Onset   Colon cancer Mother        colon   Asthma Mother    Heart disease Father    Hypertension Father    Colon cancer Sister    Stroke Maternal Grandfather    Diabetes Maternal Grandfather    Lung cancer Maternal Grandmother    Breast cancer Maternal Grandmother    Esophageal cancer Neg Hx    Pancreatic cancer Neg Hx    Liver disease Neg Hx    Stomach cancer Neg Hx    Social History   Socioeconomic History   Marital  status: Married    Spouse name: Not on file   Number of children: Not on file   Years of education: Not on file   Highest education level: 12th grade  Occupational History   Not on file  Tobacco Use   Smoking status: Never   Smokeless tobacco: Never  Vaping Use   Vaping status: Never Used  Substance and Sexual Activity   Alcohol use: No   Drug use: No   Sexual activity: Yes    Birth control/protection: None, Post-menopausal  Other Topics Concern   Not on file  Social History Narrative   Not on file   Social Drivers of Health   Financial Resource Strain: Low Risk  (08/24/2023)   Overall Financial Resource Strain (CARDIA)    Difficulty of Paying Living Expenses: Not hard at all  Food Insecurity: No  Food Insecurity (08/24/2023)   Hunger Vital Sign    Worried About Running Out of Food in the Last Year: Never true    Ran Out of Food in the Last Year: Never true  Transportation Needs: No Transportation Needs (08/24/2023)   PRAPARE - Administrator, Civil Service (Medical): No    Lack of Transportation (Non-Medical): No  Physical Activity: Insufficiently Active (08/24/2023)   Exercise Vital Sign    Days of Exercise per Week: 3 days    Minutes of Exercise per Session: 20 min  Stress: No Stress Concern Present (08/24/2023)   [employer] of Occupational Health - Occupational Stress Questionnaire    Feeling of Stress : Only a little  Social Connections: Moderately Integrated (08/24/2023)   Social Connection and Isolation Panel [NHANES]    Frequency of Communication with Friends and Family: More than three times a week    Frequency of Social Gatherings with Friends and Family: Once a week    Attends Religious Services: More than 4 times per year    Active Member of [employer] or Organizations: No    Attends Engineer, structural: Never    Marital Status: Married    Tobacco Counseling Counseling given: Not Answered    Clinical Intake:  Pre-visit preparation  completed: Yes  Pain : No/denies pain     BMI - recorded: 33.66 Nutritional Status: BMI > 30  Obese Nutritional Risks: Nausea/ vomitting/ diarrhea (nausea off and on sees GI) Diabetes: No  Lab Results  Component Value Date   HGBA1C 5.4 10/03/2019   HGBA1C 5.5 03/14/2019   HGBA1C 5.5 09/30/2018     How often do you need to have someone help you when you read instructions, pamphlets, or other written materials from your doctor or pharmacy?: 1 - Never  Interpreter Needed?: No  Information entered by :: R. Elysse Polidore LPN   Activities of Daily Living     08/24/2023    9:27 AM  In your present state of health, do you have any difficulty performing the following activities:  Hearing? 0  Vision? 0  Comment readers  Difficulty concentrating or making decisions? 0  Walking or climbing stairs? 0  Dressing or bathing? 0  Doing errands, shopping? 0  Preparing Food and eating ? N  Using the Toilet? N  In the past six months, have you accidently leaked urine? N  Do you have problems with loss of bowel control? Y  Managing your Medications? N  Managing your Finances? N  Housekeeping or managing your Housekeeping? N    Patient Care Team: Dellar Fenton, MD as PCP - General (Internal Medicine)  I have updated your Care Teams any recent Medical Services you may have received from other providers in the past year.     Assessment:    This is a routine wellness examination for Savannah Gomez.  Hearing/Vision screen Hearing Screening - Comments:: No issues Vision Screening - Comments:: readers   Goals Addressed             This Visit's Progress    Patient Stated       Wants to exercise more and lose some weight       Depression Screen     08/24/2023    9:34 AM 10/02/2022    7:38 AM 07/31/2022    4:01 PM 01/31/2022    7:06 AM 09/23/2021   10:03 AM 09/16/2021   12:38 PM 01/22/2021    8:36 AM  PHQ 2/9 Scores  PHQ - 2 Score 0 0 0 0 0 0 0  PHQ- 9 Score 1          Fall Risk      08/24/2023    9:29 AM 10/02/2022    7:37 AM 07/27/2022    1:24 PM 01/31/2022    7:06 AM 09/23/2021   10:03 AM  Fall Risk   Falls in the past year? 0 0 0 0 0  Number falls in past yr: 0 0 0 0 0  Injury with Fall? 0 0 0 0 0  Risk for fall due to : No Fall Risks No Fall Risks No Fall Risks No Fall Risks No Fall Risks  Follow up Falls evaluation completed;Falls prevention discussed Falls evaluation completed Falls prevention discussed;Falls evaluation completed Falls evaluation completed Falls evaluation completed    MEDICARE RISK AT HOME:  Medicare Risk at Home Any stairs in or around the home?: Yes If so, are there any without handrails?: No Home free of loose throw rugs in walkways, pet beds, electrical cords, etc?: Yes Adequate lighting in your home to reduce risk of falls?: Yes Life alert?: No Use of a cane, walker or w/c?: No Grab bars in the bathroom?: No Shower chair or bench in shower?: No Elevated toilet seat or a handicapped toilet?: Yes  TIMED UP AND GO:  Was the test performed?  No  Cognitive Function: 6CIT completed        08/24/2023    9:39 AM 07/31/2022    4:01 PM  6CIT Screen  What Year? 0 points 0 points  What month? 0 points 0 points  What time? 0 points 0 points  Count back from 20 0 points 0 points  Months in reverse 0 points 0 points  Repeat phrase 0 points 0 points  Total Score 0 points 0 points    Immunizations Immunization History  Administered Date(s) Administered   Fluad Quad(high Dose 65+) 01/17/2020, 01/22/2021, 01/31/2022   Fluad Trivalent(High Dose 65+) 02/03/2023   Influenza Split 12/18/2015   Influenza,inj,Quad PF,6+ Mos 01/08/2017, 01/01/2018   Influenza-Unspecified 12/17/2012, 01/05/2014, 01/08/2015, 01/01/2018, 12/23/2018   PFIZER(Purple Top)SARS-COV-2 Vaccination 07/14/2019, 08/09/2019, 04/19/2020   Pfizer Covid-19 Vaccine Bivalent Booster 64yrs & up 12/17/2020   Pneumococcal Conjugate-13 09/19/2019   Pneumococcal Polysaccharide-23  08/19/2016, 09/23/2021   Zoster Recombinant(Shingrix) 02/09/2019, 05/10/2019    Screening Tests Health Maintenance  Topic Date Due   DTaP/Tdap/Td (1 - Tdap) Never done   COVID-19 Vaccine (5 - 2024-25 season) 11/23/2022   Medicare Annual Wellness (AWV)  07/31/2023   INFLUENZA VACCINE  10/23/2023   MAMMOGRAM  12/04/2023   Colonoscopy  07/29/2027   Pneumonia Vaccine 34+ Years old  Completed   DEXA SCAN  Completed   Hepatitis C Screening  Completed   Zoster Vaccines- Shingrix  Completed   HPV VACCINES  Aged Out   Meningococcal B Vaccine  Aged Out    Health Maintenance  Health Maintenance Due  Topic Date Due   DTaP/Tdap/Td (1 - Tdap) Never done   COVID-19 Vaccine (5 - 2024-25 season) 11/23/2022   Medicare Annual Wellness (AWV)  07/31/2023   Health Maintenance Items Addressed: Discussed the need to update Tetanus (Tdap) and covid vaccines. Patient wants to discuss Dexa with PCP at next visit.  Additional Screening:  Vision Screening: Recommended annual ophthalmology exams for early detection of glaucoma and other disorders of the eye. Up to date [facility] Would you like a referral to an eye doctor? No    Dental Screening:  Recommended annual dental exams for proper oral hygiene  Community Resource Referral / Chronic Care Management: CRR required this visit?  No   CCM required this visit?  No   Plan:    I have personally reviewed and noted the following in the patient's chart:   Medical and social history Use of alcohol, tobacco or illicit drugs  Current medications and supplements including opioid prescriptions. Patient is not currently taking opioid prescriptions. Functional ability and status Nutritional status Physical activity Advanced directives List of other physicians Hospitalizations, surgeries, and ER visits in previous 12 months Vitals Screenings to include cognitive, depression, and falls Referrals and appointments  In addition, I have reviewed and  discussed with patient certain preventive protocols, quality metrics, and best practice recommendations. A written personalized care plan for preventive services as well as general preventive health recommendations were provided to patient.   Felicitas Horse, LPN   11/27/452   After Visit Summary: (MyChart) Due to this being a telephonic visit, the after visit summary with patients personalized plan was offered to patient via MyChart   Notes: Nothing significant to report at this time.

## 2023-08-24 NOTE — Patient Instructions (Signed)
 [name] Gomez , Thank you for taking time out of your busy schedule to complete your Annual Wellness Visit with me. I enjoyed our conversation and look forward to speaking with you again next year. I, as well as your care team,  appreciate your ongoing commitment to your health goals. Please review the following plan we discussed and let me know if I can assist you in the future. Your Game plan/ To Do List    Referrals: If you haven't heard from the office you've been referred to, please reach out to them at the phone provided.  Remember to update your Tetanus (Tdap) vaccine and consider covid vaccine. Follow up Visits: Next Medicare AWV with our clinical staff: 08/24/24 @ 9:30   Have you seen your provider in the last 6 months (3 months if uncontrolled diabetes)? Yes Next Office Visit with your provider: 11/02/23  Clinician Recommendations:  Aim for 30 minutes of exercise or brisk walking, 6-8 glasses of water, and 5 servings of fruits and vegetables each day.       This is a list of the screening recommended for you and due dates:  Health Maintenance  Topic Date Due   DTaP/Tdap/Td vaccine (1 - Tdap) Never done   COVID-19 Vaccine (5 - 2024-25 season) 11/23/2022   Flu Shot  10/23/2023   Mammogram  12/04/2023   Medicare Annual Wellness Visit  08/23/2024   Colon Cancer Screening  07/29/2027   Pneumonia Vaccine  Completed   DEXA scan (bone density measurement)  Completed   Hepatitis C Screening  Completed   Zoster (Shingles) Vaccine  Completed   HPV Vaccine  Aged Out   Meningitis B Vaccine  Aged Out    Advanced directives: (Copy Requested) Please bring a copy of your health care power of attorney and living will to the office to be added to your chart at your convenience. You can mail to [facility] [address] [place], [place] [address] or email to [email or web address] Advance Care Planning is important because it:  [x]  Makes sure you receive the medical care  that is consistent with your values, goals, and preferences  [x]  It provides guidance to your family and loved ones and reduces their decisional burden about whether or not they are making the right decisions based on your wishes.

## 2023-08-26 ENCOUNTER — Telehealth: Payer: Self-pay | Admitting: *Deleted

## 2023-08-26 DIAGNOSIS — K449 Diaphragmatic hernia without obstruction or gangrene: Secondary | ICD-10-CM

## 2023-08-26 NOTE — Telephone Encounter (Signed)
Faxed referral today to [facility]

## 2023-08-27 DIAGNOSIS — K449 Diaphragmatic hernia without obstruction or gangrene: Secondary | ICD-10-CM | POA: Diagnosis not present

## 2023-08-27 DIAGNOSIS — R12 Heartburn: Secondary | ICD-10-CM | POA: Diagnosis not present

## 2023-09-10 ENCOUNTER — Encounter: Payer: Self-pay | Admitting: Urology

## 2023-09-10 NOTE — Telephone Encounter (Signed)
 Patient seen 6/5 at [facility]

## 2023-10-05 ENCOUNTER — Other Ambulatory Visit

## 2023-10-07 ENCOUNTER — Encounter: Admitting: Internal Medicine

## 2023-10-16 ENCOUNTER — Encounter: Payer: Self-pay | Admitting: Internal Medicine

## 2023-10-16 DIAGNOSIS — M25511 Pain in right shoulder: Secondary | ICD-10-CM | POA: Diagnosis not present

## 2023-10-16 DIAGNOSIS — S43401A Unspecified sprain of right shoulder joint, initial encounter: Secondary | ICD-10-CM | POA: Diagnosis not present

## 2023-10-26 ENCOUNTER — Other Ambulatory Visit (INDEPENDENT_AMBULATORY_CARE_PROVIDER_SITE_OTHER)

## 2023-10-26 DIAGNOSIS — E7800 Pure hypercholesterolemia, unspecified: Secondary | ICD-10-CM | POA: Diagnosis not present

## 2023-10-26 LAB — HEPATIC FUNCTION PANEL
ALT: 18 U/L (ref 0–35)
AST: 17 U/L (ref 0–37)
Albumin: 4.3 g/dL (ref 3.5–5.2)
Alkaline Phosphatase: 133 U/L — ABNORMAL HIGH (ref 39–117)
Bilirubin, Direct: 0.2 mg/dL (ref 0.0–0.3)
Total Bilirubin: 0.6 mg/dL (ref 0.2–1.2)
Total Protein: 6.6 g/dL (ref 6.0–8.3)

## 2023-10-26 LAB — LIPID PANEL
Cholesterol: 191 mg/dL (ref 0–200)
HDL: 84.6 mg/dL (ref >39.00)
LDL Cholesterol: 95 mg/dL (ref 0–99)
NonHDL: 106.45
Total CHOL/HDL Ratio: 2
Triglycerides: 58 mg/dL (ref 0.0–149.0)
VLDL: 11.6 mg/dL (ref 0.0–40.0)

## 2023-10-26 LAB — BASIC METABOLIC PANEL WITH GFR
BUN: 13 mg/dL (ref 6–23)
CO2: 25 meq/L (ref 19–32)
Calcium: 9.1 mg/dL (ref 8.4–10.5)
Chloride: 105 meq/L (ref 96–112)
Creatinine, Ser: 0.86 mg/dL (ref 0.40–1.20)
GFR: 69 mL/min (ref >60.00)
Glucose, Bld: 98 mg/dL (ref 70–99)
Potassium: 4 meq/L (ref 3.5–5.1)
Sodium: 139 meq/L (ref 135–145)

## 2023-10-27 ENCOUNTER — Ambulatory Visit: Payer: Self-pay | Admitting: Internal Medicine

## 2023-11-02 ENCOUNTER — Ambulatory Visit (INDEPENDENT_AMBULATORY_CARE_PROVIDER_SITE_OTHER): Admitting: Internal Medicine

## 2023-11-02 ENCOUNTER — Encounter: Payer: Self-pay | Admitting: Internal Medicine

## 2023-11-02 VITALS — BP 120/60 | HR 83 | Temp 97.4°F | Ht 63.0 in | Wt 191.8 lb

## 2023-11-02 DIAGNOSIS — E7800 Pure hypercholesterolemia, unspecified: Secondary | ICD-10-CM

## 2023-11-02 DIAGNOSIS — M25511 Pain in right shoulder: Secondary | ICD-10-CM | POA: Diagnosis not present

## 2023-11-02 DIAGNOSIS — Z800 Family history of malignant neoplasm of digestive organs: Secondary | ICD-10-CM

## 2023-11-02 DIAGNOSIS — R748 Abnormal levels of other serum enzymes: Secondary | ICD-10-CM

## 2023-11-02 DIAGNOSIS — Z1231 Encounter for screening mammogram for malignant neoplasm of breast: Secondary | ICD-10-CM

## 2023-11-02 DIAGNOSIS — E2839 Other primary ovarian failure: Secondary | ICD-10-CM | POA: Diagnosis not present

## 2023-11-02 DIAGNOSIS — R1084 Generalized abdominal pain: Secondary | ICD-10-CM | POA: Diagnosis not present

## 2023-11-02 DIAGNOSIS — Z0000 Encounter for general adult medical examination without abnormal findings: Secondary | ICD-10-CM

## 2023-11-02 DIAGNOSIS — J4530 Mild persistent asthma, uncomplicated: Secondary | ICD-10-CM | POA: Diagnosis not present

## 2023-11-02 DIAGNOSIS — K219 Gastro-esophageal reflux disease without esophagitis: Secondary | ICD-10-CM | POA: Diagnosis not present

## 2023-11-02 NOTE — Assessment & Plan Note (Addendum)
 The 10-year ASCVD risk score (Arnett DK, et al., 2019) is: 6.7%   Values used to calculate the score:     Age: 68 years     Clincally relevant sex: Female     Is Non-Hispanic African American: No     Diabetic: No     Tobacco smoker: No     Systolic Blood Pressure: 120 mmHg     Is BP treated: No     HDL Cholesterol: 84.6 mg/dL     Total Cholesterol: 191 mg/dL  Low cholesterol diet and exercise. Discussed calculated cholesterol risk - 6%.  Have previously discussed calcium score.  Continue diet and exercise. Follow lipid panel.

## 2023-11-02 NOTE — Progress Notes (Signed)
 Subjective:    Patient ID: Savannah Gomez, female    DOB: November 09, 1955, 68 y.o.   MRN: [id number]  Patient here for  Chief Complaint  Patient presents with   Annual Exam    HPI Here for a physical exam.   Has had extensive GI w/up as outlined in 10/02/22 note. Capsule endoscopy - negative 06/24/22. Colonoscopy 07/2022 - one 5 mm polyp in the transverse colon, friability with no bleeding in the proximal rectum and non bleeding external and internal hemorrhoids. EGD 12/10/22 - hiatal hernia and gastritis. MRCP 12/02/22 - no evidence of biliary dectal dilatation, choledocholithiasis or other acute finding. Had f/u with GI 03/02/23 - persistent nausea and regurgitation of food despite protonix , pepcid  and carafate . Also reported left side abdominal pain. GI recommended changing protonix  to dexilant  and referral to Dr Tanda for consideration of hernia repair.  Also recommended esophageal manometry Recently evaluated ([facility]) for evlauation hiatal hernia with reflux. Recommended to continue dexilant , pH probe test. Continue sucralfate  and pepcid . Monometry - ok. She feels the dexilant  may have helped some. Still with flares 1-2x/week. Continues f/u with GI. Breathing stable. She has had two falls. Most recent - several weeks ago. Stumbled. Injured her right shoulder. Follow up [facility] 10/16/23 - recommended allowing the area to rest/sling and voltaren gel.    Past Medical History:  Diagnosis Date   Allergy    Arthritis    Asthma    Chicken pox    Complication of anesthesia    GERD (gastroesophageal reflux disease)    History of kidney stones    Migraine headache    PONV (postoperative nausea and vomiting)    Urine incontinence    Past Surgical History:  Procedure Laterality Date   BREAST CYST ASPIRATION Bilateral    neg   carpal tunnel sugery     bilateral   CHOLECYSTECTOMY N/A 11/04/2019   Procedure: LAPAROSCOPIC CHOLECYSTECTOMY WITH INTRAOPERATIVE CHOLANGIOGRAM;  Surgeon: Dessa Reyes ORN, MD;  Location: [facility] ORS;  Service: General;  Laterality: N/A;   COLONOSCOPY WITH PROPOFOL  N/A 09/20/2018   Procedure: COLONOSCOPY WITH PROPOFOL ;  Surgeon: Viktoria Lamar DASEN, MD;  Location: [facility] ENDOSCOPY;  Service: Endoscopy;  Laterality: N/A;   ELBOW SURGERY  1999   ESOPHAGEAL MANOMETRY N/A 07/22/2023   Procedure: MANOMETRY, ESOPHAGUS;  Surgeon: Shila Gustav GAILS, MD;  Location: [facility] ENDOSCOPY;  Service: Gastroenterology;  Laterality: N/A;   ESOPHAGOGASTRODUODENOSCOPY (EGD) WITH PROPOFOL  N/A 09/20/2018   Procedure: ESOPHAGOGASTRODUODENOSCOPY (EGD) WITH PROPOFOL ;  Surgeon: Viktoria Lamar DASEN, MD;  Location: [facility] ENDOSCOPY;  Service: Endoscopy;  Laterality: N/A;   HAND SURGERY  1999   broken finger - pins   NASAL SINUS SURGERY  1992   ROTATOR CUFF REPAIR  1994   Trigger finger surgery  1999 and 2000   VAGINAL HYSTERECTOMY  1987   secondary to fibroids   Family History  Problem Relation Age of Onset   Colon cancer Mother        colon   Asthma Mother    Heart disease Father    Hypertension Father    Colon cancer Sister    Stroke Maternal Grandfather    Diabetes Maternal Grandfather    Lung cancer Maternal Grandmother    Breast cancer Maternal Grandmother    Esophageal cancer Neg Hx    Pancreatic cancer Neg Hx    Liver disease Neg Hx    Stomach cancer Neg Hx    Social History   Socioeconomic History   Marital status: Married  Spouse name: Not on file   Number of children: Not on file   Years of education: Not on file   Highest education level: 12th grade  Occupational History   Not on file  Tobacco Use   Smoking status: Never   Smokeless tobacco: Never  Vaping Use   Vaping status: Never Used  Substance and Sexual Activity   Alcohol use: No   Drug use: No   Sexual activity: Yes    Birth control/protection: None, Post-menopausal  Other Topics Concern   Not on file  Social History Narrative   Not on file   Social Drivers of Health   Financial Resource  Strain: Low Risk  (10/30/2023)   Overall Financial Resource Strain (CARDIA)    Difficulty of Paying Living Expenses: Not hard at all  Food Insecurity: No Food Insecurity (10/30/2023)   Hunger Vital Sign    Worried About Running Out of Food in the Last Year: Never true    Ran Out of Food in the Last Year: Never true  Transportation Needs: No Transportation Needs (10/30/2023)   PRAPARE - Administrator, Civil Service (Medical): No    Lack of Transportation (Non-Medical): No  Physical Activity: Insufficiently Active (10/30/2023)   Exercise Vital Sign    Days of Exercise per Week: 3 days    Minutes of Exercise per Session: 10 min  Stress: No Stress Concern Present (10/30/2023)   [employer] of Occupational Health - Occupational Stress Questionnaire    Feeling of Stress: Only a little  Social Connections: Socially Integrated (10/30/2023)   Social Connection and Isolation Panel    Frequency of Communication with Friends and Family: Three times a week    Frequency of Social Gatherings with Friends and Family: Once a week    Attends Religious Services: More than 4 times per year    Active Member of [employer] or Organizations: Yes    Attends Engineer, structural: More than 4 times per year    Marital Status: Married     Review of Systems  Constitutional:  Negative for fever and unexpected weight change.  HENT:  Negative for congestion and sinus pressure.   Respiratory:  Negative for cough, chest tightness and shortness of breath.   Cardiovascular:  Negative for chest pain, palpitations and leg swelling.  Gastrointestinal:        GI symptoms and issues as outlined.   Genitourinary:  Negative for difficulty urinating and dysuria.  Musculoskeletal:  Negative for joint swelling and myalgias.       Right shoulder injury as outlined.   Skin:  Negative for color change and rash.  Neurological:  Negative for dizziness and headaches.  Psychiatric/Behavioral:  Negative for agitation  and dysphoric mood.        Objective:     BP 120/60 (BP Location: Left Arm, Patient Position: Sitting, Cuff Size: Normal)   Pulse 83   Temp (!) 97.4 F (36.3 C) (Oral)   Ht 5' 3 (1.6 m)   Wt 191 lb 12.8 oz (87 kg)   SpO2 98%   BMI 33.98 kg/m  Wt Readings from Last 3 Encounters:  11/02/23 191 lb 12.8 oz (87 kg)  08/24/23 190 lb (86.2 kg)  06/03/23 191 lb (86.6 kg)    Physical Exam Vitals reviewed.  Constitutional:      General: She is not in acute distress.    Appearance: Normal appearance. She is well-developed.  HENT:     Head: Normocephalic and atraumatic.  Right Ear: External ear normal.     Left Ear: External ear normal.     Mouth/Throat:     Pharynx: No oropharyngeal exudate or posterior oropharyngeal erythema.  Eyes:     General: No scleral icterus.       Right eye: No discharge.        Left eye: No discharge.     Conjunctiva/sclera: Conjunctivae normal.  Neck:     Thyroid : No thyromegaly.  Cardiovascular:     Rate and Rhythm: Normal rate and regular rhythm.  Pulmonary:     Effort: No tachypnea, accessory muscle usage or respiratory distress.     Breath sounds: Normal breath sounds. No decreased breath sounds or wheezing.  Chest:  Breasts:    Right: No inverted nipple, mass, nipple discharge or tenderness (no axillary adenopathy).     Left: No inverted nipple, mass, nipple discharge or tenderness (no axilarry adenopathy).  Abdominal:     General: Bowel sounds are normal.     Palpations: Abdomen is soft.     Tenderness: There is no abdominal tenderness.  Musculoskeletal:        General: No swelling or tenderness.     Cervical back: Neck supple.  Lymphadenopathy:     Cervical: No cervical adenopathy.  Skin:    Findings: No erythema or rash.  Neurological:     Mental Status: She is alert and oriented to person, place, and time.  Psychiatric:        Mood and Affect: Mood normal.        Behavior: Behavior normal.         Outpatient Encounter  Medications as of 11/02/2023  Medication Sig   acetaminophen  (TYLENOL ) 500 MG tablet Take 1,000 mg by mouth daily as needed for mild pain or headache.    albuterol  (VENTOLIN  HFA) 108 (90 Base) MCG/ACT inhaler Inhale 2 puffs into the lungs every 6 (six) hours as needed for wheezing or shortness of breath.   dexlansoprazole  (DEXILANT ) 60 MG capsule Take 1 capsule (60 mg total) by mouth daily.   dicyclomine  (BENTYL ) 10 MG capsule Take 1 capsule (10 mg total) by mouth 3 (three) times daily as needed for spasms (for abdominal pain and cramping).   famotidine  (PEPCID ) 20 MG tablet Take 1 tablet (20 mg total) by mouth 2 (two) times daily.   Fluticasone  Furoate (ARNUITY ELLIPTA ) 100 MCG/ACT AEPB Inhale 1 puff into the lungs daily.   imipramine  (TOFRANIL ) 25 MG tablet Take 1 tablet (25 mg total) by mouth 2 (two) times daily.   sucralfate  (CARAFATE ) 1 GM/10ML suspension Take 10 mLs (1 g total) by mouth 4 (four) times daily -  with meals and at bedtime.   tolterodine  (DETROL  LA) 4 MG 24 hr capsule Take 1 capsule (4 mg total) by mouth daily.   topiramate  (TOPAMAX ) 25 MG tablet Take 1 tablet (25 mg total) by mouth 2 (two) times daily.   [DISCONTINUED] hydrocortisone  (ANUSOL -HC) 25 MG suppository Place 1 suppository (25 mg total) rectally every 12 (twelve) hours. (Patient not taking: Reported on 11/02/2023)   No facility-administered encounter medications on file as of 11/02/2023.     Lab Results  Component Value Date   WBC 4.3 01/30/2023   HGB 13.7 01/30/2023   HCT 40.7 01/30/2023   PLT 250.0 01/30/2023   GLUCOSE 98 10/26/2023   CHOL 191 10/26/2023   TRIG 58.0 10/26/2023   HDL 84.60 10/26/2023   LDLCALC 95 10/26/2023   ALT 18 10/26/2023   AST 17 10/26/2023  NA 139 10/26/2023   K 4.0 10/26/2023   CL 105 10/26/2023   CREATININE 0.86 10/26/2023   BUN 13 10/26/2023   CO2 25 10/26/2023   TSH 2.45 06/01/2023   INR 1.0 11/26/2022   HGBA1C 5.4 10/03/2019       Assessment & Plan:  Health care  maintenance Assessment & Plan: Physical today 11/02/23.  Mammogram 12/04/22 - Birads I.  Colonoscopy 07/2022  - one 5 mm polyp in the transverse colon, friability with no bleeding in the proximal rectum and non bleeding external and internal hemorrhoids. Schedule f/u mammogram and bone density.    Hypercholesteremia Assessment & Plan: The 10-year ASCVD risk score (Arnett DK, et al., 2019) is: 6.7%   Values used to calculate the score:     Age: 23 years     Clincally relevant sex: Female     Is Non-Hispanic African American: No     Diabetic: No     Tobacco smoker: No     Systolic Blood Pressure: 120 mmHg     Is BP treated: No     HDL Cholesterol: 84.6 mg/dL     Total Cholesterol: 191 mg/dL  Low cholesterol diet and exercise. Discussed calculated cholesterol risk - 6%.  Have previously discussed calcium score.  Continue diet and exercise. Follow lipid panel.   Orders: -     Lipid panel; Future -     Basic metabolic panel with GFR; Future -     Hepatic function panel; Future  Encounter for screening mammogram for malignant neoplasm of breast -     3D Screening Mammogram, Left and Right; Future  Estrogen deficiency -     DG Bone Density; Future  Generalized abdominal pain Assessment & Plan:  Has had extensive GI w/up as outlined in 10/02/22 note. Capsule endoscopy - negative 06/24/22. Colonoscopy 07/2022 - one 5 mm polyp in the transverse colon, friability with no bleeding in the proximal rectum and non bleeding external and internal hemorrhoids. EGD 12/10/22 - hiatal hernia and gastritis. MRCP 12/02/22 - no evidence of biliary dectal dilatation, choledocholithiasis or other acute finding. Still with intermittent flares despite above medication regimen. Still will have intermittent episodes as outlined. On dexilant . Helping acid reflux. Also recommended a referral to Dr Tanda for consideration of hernia repair.  Recently evaluated ([facility]) for evlauation hiatal hernia with reflux.  Recommended to continue dexilant , pH probe test. Continue sucralfate  and pepcid . Monometry - ok. She feels the dexilant  may have helped some. Still with flares 1-2x/week. Continues f/u with GI. Continue dexilant .    Mild persistent asthma without complication Assessment & Plan: Breathing stable.    Elevated alkaline phosphatase level Assessment & Plan: Seeing GI.   MRCP 12/02/22 - no evidence of biliary dectal dilatation, choledocholithiasis or other acute finding. Continue f/u with GI.  Stable.  Recent check 10/26/23 - 133.  Has been stable. Following with GI.    Family history of colon cancer Assessment & Plan: Reviewed.  Colonoscopy 07/2022 - as outlined.    Gastroesophageal reflux disease, unspecified whether esophagitis present Assessment & Plan:  Capsule endoscopy - negative 06/24/22. Colonoscopy 07/2022 - one 5 mm polyp in the transverse colon, friability with no bleeding in the proximal rectum and non bleeding external and internal hemorrhoids. EGD 12/10/22 - hiatal hernia and gastritis. MRCP 12/02/22 - no evidence of biliary dectal dilatation, choledocholithiasis or other acute finding. Had f/u with GI 03/02/23 - persistent nausea and regurgitation of food despite protonix , pepcid  and carafate . Also reported  left side abdominal pain. GI recommended changing protonix  to dexilant  and referral to Dr Tanda for consideration of hernia repair. Continue dexilant .    Right shoulder pain, unspecified chronicity Assessment & Plan: Seeing ortho s/p fall. Recent check - recommended voltaren gel/sling.       Allena Hamilton, MD

## 2023-11-02 NOTE — Assessment & Plan Note (Addendum)
 Physical today 11/02/23.  Mammogram 12/04/22 - Birads I.  Colonoscopy 07/2022  - one 5 mm polyp in the transverse colon, friability with no bleeding in the proximal rectum and non bleeding external and internal hemorrhoids. Schedule f/u mammogram and bone density.

## 2023-11-08 ENCOUNTER — Encounter: Payer: Self-pay | Admitting: Internal Medicine

## 2023-11-08 NOTE — Assessment & Plan Note (Signed)
 Seeing GI.   MRCP 12/02/22 - no evidence of biliary dectal dilatation, choledocholithiasis or other acute finding. Continue f/u with GI.  Stable.  Recent check 10/26/23 - 133.  Has been stable. Following with GI.

## 2023-11-08 NOTE — Assessment & Plan Note (Signed)
 Breathing stable.

## 2023-11-08 NOTE — Assessment & Plan Note (Signed)
 Seeing ortho s/p fall. Recent check - recommended voltaren gel/sling.

## 2023-11-08 NOTE — Assessment & Plan Note (Signed)
 Capsule endoscopy - negative 06/24/22. Colonoscopy 07/2022 - one 5 mm polyp in the transverse colon, friability with no bleeding in the proximal rectum and non bleeding external and internal hemorrhoids. EGD 12/10/22 - hiatal hernia and gastritis. MRCP 12/02/22 - no evidence of biliary dectal dilatation, choledocholithiasis or other acute finding. Had f/u with GI 03/02/23 - persistent nausea and regurgitation of food despite protonix , pepcid  and carafate . Also reported left side abdominal pain. GI recommended changing protonix  to dexilant  and referral to Dr Tanda for consideration of hernia repair. Continue dexilant .

## 2023-11-08 NOTE — Assessment & Plan Note (Signed)
 Has had extensive GI w/up as outlined in 10/02/22 note. Capsule endoscopy - negative 06/24/22. Colonoscopy 07/2022 - one 5 mm polyp in the transverse colon, friability with no bleeding in the proximal rectum and non bleeding external and internal hemorrhoids. EGD 12/10/22 - hiatal hernia and gastritis. MRCP 12/02/22 - no evidence of biliary dectal dilatation, choledocholithiasis or other acute finding. Still with intermittent flares despite above medication regimen. Still will have intermittent episodes as outlined. On dexilant . Helping acid reflux. Also recommended a referral to Dr Tanda for consideration of hernia repair.  Recently evaluated ([facility]) for evlauation hiatal hernia with reflux. Recommended to continue dexilant , pH probe test. Continue sucralfate  and pepcid . Monometry - ok. She feels the dexilant  may have helped some. Still with flares 1-2x/week. Continues f/u with GI. Continue dexilant .

## 2023-11-08 NOTE — Assessment & Plan Note (Signed)
 Reviewed.  Colonoscopy 07/2022 - as outlined.

## 2023-11-12 DIAGNOSIS — R1310 Dysphagia, unspecified: Secondary | ICD-10-CM

## 2023-11-24 ENCOUNTER — Ambulatory Visit: Admitting: Urology

## 2023-11-25 ENCOUNTER — Ambulatory Visit: Admitting: Urology

## 2023-11-25 ENCOUNTER — Encounter: Payer: Self-pay | Admitting: Urology

## 2023-11-25 VITALS — BP 123/78 | HR 71 | Ht 63.0 in | Wt 191.0 lb

## 2023-11-25 DIAGNOSIS — N3010 Interstitial cystitis (chronic) without hematuria: Secondary | ICD-10-CM

## 2023-11-25 DIAGNOSIS — N3941 Urge incontinence: Secondary | ICD-10-CM

## 2023-11-25 LAB — URINALYSIS, COMPLETE
Bilirubin, UA: NEGATIVE
Glucose, UA: NEGATIVE
Ketones, UA: NEGATIVE
Nitrite, UA: NEGATIVE
Protein,UA: NEGATIVE
RBC, UA: NEGATIVE
Specific Gravity, UA: <1.005 — ABNORMAL LOW (ref 1.005–1.030)
Urobilinogen, Ur: 0.2 mg/dL (ref 0.2–1.0)
pH, UA: 6 (ref 5.0–7.5)

## 2023-11-25 LAB — MICROSCOPIC EXAMINATION

## 2023-11-25 MED ORDER — IMIPRAMINE HCL 25 MG PO TABS: 25.0000 mg | ORAL_TABLET | Freq: Two times a day (BID) | ORAL | 3 refills | Status: AC

## 2023-11-25 NOTE — Progress Notes (Signed)
 11/25/2023 10:32 AM   [name] 05-10-1955 [id number]  Referring provider: Glendia Shad, MD [address] [place],  [place] [address]  Chief Complaint  Patient presents with   Cystitis   Urologic history: 1.  Interstitial cystitis - Imipramine  50 mg daily - Tolterodine  4 mg daily   2.  Personal history urinary calculi - 4 mm nonobstructing left lower pole calculus CT 07/2018 - CT 05/2021 showed no urinary tract calculi   HPI: Savannah Gomez is a 68 y.o. female presents for annual follow-up.  No significant changes since last year's visit Stable LUTS on tolterodine  No pelvic pain, remains on imipramine  Denies dysuria, gross hematuria She has had GI flares and after these occur does note that her urine is brownish in color   PMH: Past Medical History:  Diagnosis Date   Allergy    Arthritis    Asthma    Chicken pox    Complication of anesthesia    GERD (gastroesophageal reflux disease)    History of kidney stones    Migraine headache    PONV (postoperative nausea and vomiting)    Urine incontinence     Surgical History: Past Surgical History:  Procedure Laterality Date   BREAST CYST ASPIRATION Bilateral    neg   carpal tunnel sugery     bilateral   CHOLECYSTECTOMY N/A 11/04/2019   Procedure: LAPAROSCOPIC CHOLECYSTECTOMY WITH INTRAOPERATIVE CHOLANGIOGRAM;  Surgeon: Dessa Reyes ORN, MD;  Location: [facility] ORS;  Service: General;  Laterality: N/A;   COLONOSCOPY WITH PROPOFOL  N/A 09/20/2018   Procedure: COLONOSCOPY WITH PROPOFOL ;  Surgeon: Viktoria Lamar DASEN, MD;  Location: [facility] ENDOSCOPY;  Service: Endoscopy;  Laterality: N/A;   ELBOW SURGERY  1999   ESOPHAGEAL MANOMETRY N/A 07/22/2023   Procedure: MANOMETRY, ESOPHAGUS;  Surgeon: Shila Gustav GAILS, MD;  Location: [facility] ENDOSCOPY;  Service: Gastroenterology;  Laterality: N/A;   ESOPHAGOGASTRODUODENOSCOPY (EGD) WITH PROPOFOL  N/A 09/20/2018   Procedure: ESOPHAGOGASTRODUODENOSCOPY (EGD)  WITH PROPOFOL ;  Surgeon: Viktoria Lamar DASEN, MD;  Location: [facility] ENDOSCOPY;  Service: Endoscopy;  Laterality: N/A;   HAND SURGERY  1999   broken finger - pins   NASAL SINUS SURGERY  1992   ROTATOR CUFF REPAIR  1994   Trigger finger surgery  1999 and 2000   VAGINAL HYSTERECTOMY  1987   secondary to fibroids    Home Medications:  Allergies as of 11/25/2023       Reactions   Demerol [meperidine] Nausea Only        Medication List        Accurate as of November 25, 2023 10:32 AM. If you have any questions, ask your nurse or doctor.          acetaminophen  500 MG tablet Commonly known as: TYLENOL  Take 1,000 mg by mouth daily as needed for mild pain or headache.   albuterol  108 (90 Base) MCG/ACT inhaler Commonly known as: VENTOLIN  HFA Inhale 2 puffs into the lungs every 6 (six) hours as needed for wheezing or shortness of breath.   Arnuity Ellipta  100 MCG/ACT Aepb Generic drug: Fluticasone  Furoate Inhale 1 puff into the lungs daily.   dexlansoprazole  60 MG capsule Commonly known as: Dexilant  Take 1 capsule (60 mg total) by mouth daily.   dicyclomine  10 MG capsule Commonly known as: BENTYL  Take 1 capsule (10 mg total) by mouth 3 (three) times daily as needed for spasms (for abdominal pain and cramping).   famotidine  20 MG tablet Commonly known as: PEPCID  Take 1 tablet (20  mg total) by mouth 2 (two) times daily.   imipramine  25 MG tablet Commonly known as: TOFRANIL  Take 1 tablet (25 mg total) by mouth 2 (two) times daily.   sucralfate  1 GM/10ML suspension Commonly known as: CARAFATE  Take 10 mLs (1 g total) by mouth 4 (four) times daily -  with meals and at bedtime.   tolterodine  4 MG 24 hr capsule Commonly known as: DETROL  LA Take 1 capsule (4 mg total) by mouth daily.   topiramate  25 MG tablet Commonly known as: TOPAMAX  Take 1 tablet (25 mg total) by mouth 2 (two) times daily.        Allergies:  Allergies  Allergen Reactions   Demerol [Meperidine]  Nausea Only    Family History: Family History  Problem Relation Age of Onset   Colon cancer Mother        colon   Asthma Mother    Heart disease Father    Hypertension Father    Colon cancer Sister    Stroke Maternal Grandfather    Diabetes Maternal Grandfather    Lung cancer Maternal Grandmother    Breast cancer Maternal Grandmother    Esophageal cancer Neg Hx    Pancreatic cancer Neg Hx    Liver disease Neg Hx    Stomach cancer Neg Hx     Social History:  reports that she has never smoked. She has never used smokeless tobacco. She reports that she does not drink alcohol and does not use drugs.   Physical Exam: BP 123/78   Pulse 71   Ht 5' 3 (1.6 m)   Wt 191 lb (86.6 kg)   BMI 33.83 kg/m   Constitutional:  Alert, No acute distress. HEENT: [place] AT Respiratory: Normal respiratory effort, no increased work of breathing. Psychiatric: Normal mood and affect.  Laboratory Data:  Urinalysis Dipstick 1+ leukocytes/microscopy 6-10 WBC   Assessment & Plan:    1. Urge incontinence  Stable, tolterodine  refilled UA today with mild pyuria but asymptomatic. She was given a specimen cup and when she notes her urine brownish in appearance will contact the office and we will have her bring in a specimen for UA  2.  Interstitial cystitis Stable, imipramine  refilled   Glendia JAYSON Barba, MD  [facility] [address] [place], [place] [address] [phone]

## 2023-11-26 ENCOUNTER — Ambulatory Visit: Payer: Self-pay | Admitting: Urology

## 2023-11-26 ENCOUNTER — Encounter: Payer: Self-pay | Admitting: Urology

## 2023-12-07 ENCOUNTER — Ambulatory Visit
Admission: RE | Admit: 2023-12-07 | Discharge: 2023-12-07 | Disposition: A | Discharge status: Home / Self Care | Source: Ambulatory Visit | Attending: Internal Medicine | Admitting: Internal Medicine

## 2023-12-07 DIAGNOSIS — Z1231 Encounter for screening mammogram for malignant neoplasm of breast: Secondary | ICD-10-CM | POA: Diagnosis not present

## 2023-12-07 DIAGNOSIS — Z780 Asymptomatic menopausal state: Secondary | ICD-10-CM | POA: Diagnosis not present

## 2023-12-07 DIAGNOSIS — M8589 Other specified disorders of bone density and structure, multiple sites: Secondary | ICD-10-CM | POA: Diagnosis not present

## 2023-12-07 DIAGNOSIS — E2839 Other primary ovarian failure: Secondary | ICD-10-CM

## 2023-12-08 ENCOUNTER — Ambulatory Visit: Payer: Self-pay | Admitting: Internal Medicine

## 2023-12-09 ENCOUNTER — Encounter: Payer: Self-pay | Admitting: Gastroenterology

## 2023-12-09 ENCOUNTER — Ambulatory Visit: Admitting: Gastroenterology

## 2023-12-09 VITALS — BP 136/84 | HR 78 | Ht 61.0 in | Wt 190.4 lb

## 2023-12-09 DIAGNOSIS — R748 Abnormal levels of other serum enzymes: Secondary | ICD-10-CM

## 2023-12-09 DIAGNOSIS — K5900 Constipation, unspecified: Secondary | ICD-10-CM | POA: Diagnosis not present

## 2023-12-09 DIAGNOSIS — R252 Cramp and spasm: Secondary | ICD-10-CM | POA: Diagnosis not present

## 2023-12-09 DIAGNOSIS — K2100 Gastro-esophageal reflux disease with esophagitis, without bleeding: Secondary | ICD-10-CM

## 2023-12-09 DIAGNOSIS — K449 Diaphragmatic hernia without obstruction or gangrene: Secondary | ICD-10-CM | POA: Diagnosis not present

## 2023-12-09 DIAGNOSIS — M810 Age-related osteoporosis without current pathological fracture: Secondary | ICD-10-CM | POA: Diagnosis not present

## 2023-12-09 DIAGNOSIS — K219 Gastro-esophageal reflux disease without esophagitis: Secondary | ICD-10-CM

## 2023-12-09 DIAGNOSIS — K581 Irritable bowel syndrome with constipation: Secondary | ICD-10-CM

## 2023-12-09 DIAGNOSIS — R110 Nausea: Secondary | ICD-10-CM | POA: Diagnosis not present

## 2023-12-09 DIAGNOSIS — R1084 Generalized abdominal pain: Secondary | ICD-10-CM

## 2023-12-09 MED ORDER — NORTRIPTYLINE HCL 10 MG PO CAPS: 10.0000 mg | ORAL_CAPSULE | Freq: Every day | ORAL | 6 refills | Status: AC

## 2023-12-09 NOTE — Progress Notes (Signed)
 Savannah Gomez    [id number]    05/08/1955  Primary Care Physician:Scott, Allena, MD  Referring Physician: Glendia Allena, MD [address] [place],  [place] [address]   Chief complaint:  abdominal pain, GERD, cons  Discussed the use of AI scribe software for clinical note transcription with the patient, who gave verbal consent to proceed.  History of Present Illness Savannah Gomez is a 68 year old female with hiatal hernia and GERD who presents with worsening abdominal pain and cramping.  Abdominal pain and cramping - Severe abdominal pain and cramping worsening over the past two weeks - Episodes occur two to three times per week, each lasting two days - Pain is severe and associated with frequent bowel movements - Nausea present without vomiting - No fever or weight loss  Altered bowel habits - Frequent bowel movements during pain episodes, requiring urgent trips to the bathroom - Stools are very runny during episodes - No daily bowel movements; typical interval is every four to five days  Urinary changes - Brown urine during episodes of abdominal pain and diarrhea  Gastroesophageal reflux disease (gerd) and hiatal hernia - Currently taking Dexilant , which is effective for GERD symptoms - Reduced use of sucralfate   Medication use for gastrointestinal symptoms - Miralax one capful daily for constipation; has not increased dose - Dicyclomine  used as needed for severe abdominal pain, not taken daily  Hydration status - Drinks two to three 16-ounce bottles of water daily  Osteoporosis and bone health - Takes vitamin D and calcium supplements for osteoporosis - Previous concern for elevated alkaline phosphatase; levels stable at 120-130 - Normal MRCP  Other medications - Takes Topamax  and imipramine  - Takes methamphetamine for bladder concerns  Esophageal Manometry 07/22/23 Normal relaxation of the GE junction No  significant esophageal peristaltic abnormality detected on this study based on [place] classification criteria  EGD 12/10/22 Z line regular, 36 cm from the incisors No gross lesions in the entire esophagus Gastroesophageal flap valve classified as Hill Grade IV (No fold, wide open lumen, hiatal hernia present) 4cm Hiatal hernia Gastritis. Biopsied Nodular mucosa in the gastric antrum. Biopsied Normal examined duodenum. 1. Surgical [P], nodule gastric mucosa :  - REACTIVE GASTROPATHY  - NEGATIVE FOR H. PYLORI ON H&E STAIN  - NEGATIVE FOR INTESTINAL METAPLASIA OR MALIGNANCY   2. Surgical [P], random gastric :  - MILD REACTIVE GASTROPATHY.  - NEGATIVE FOR H. PYLORI ON H&E STAIN  - NO INTESTINAL METAPLASIA, DYSPLASIA, OR MALIGNANCY. .  Gastric Emptying Study 05/28/20 FINDINGS: Expected location of the stomach in the left upper quadrant. Ingested meal empties the stomach gradually over the course of the study.  35% emptied at 1 hr ( normal >= 10%)  64% emptied at 2 hr ( normal >= 40%)  93% emptied at 3 hr ( normal >= 70%)  100% emptied at 4 hr ( normal >= 90%)  IMPRESSION: Normal gastric emptying study.  MRCP December 02, 2022 Prior cholecystectomy. No evidence of biliary ductal dilatation, choledocholithiasis, or other acute findings.     Colonoscopy Jul 29, 2022 - One 5 mm polyp in the transverse colon, removed with a cold snare. Resected and retrieved. - Friability with no bleeding in the proximal rectum. Biopsied. Exclude sterocoral proctitis - Non- bleeding external and internal hemorrhoids.   Capsule endoscopy for iron deficiency anemia June 24, 2022 Negative  Outpatient Encounter Medications as of 12/09/2023  Medication Sig  acetaminophen  (TYLENOL ) 500 MG tablet Take 1,000 mg by mouth daily as needed for mild pain or headache.    albuterol  (VENTOLIN  HFA) 108 (90 Base) MCG/ACT inhaler Inhale 2 puffs into the lungs every 6 (six) hours as needed for wheezing or shortness  of breath.   dexlansoprazole  (DEXILANT ) 60 MG capsule Take 1 capsule (60 mg total) by mouth daily.   dicyclomine  (BENTYL ) 10 MG capsule Take 1 capsule (10 mg total) by mouth 3 (three) times daily as needed for spasms (for abdominal pain and cramping).   famotidine  (PEPCID ) 20 MG tablet Take 20 mg by mouth 2 (two) times daily as needed for heartburn or indigestion.   Fluticasone  Furoate (ARNUITY ELLIPTA ) 100 MCG/ACT AEPB Inhale 1 puff into the lungs daily.   imipramine  (TOFRANIL ) 25 MG tablet Take 1 tablet (25 mg total) by mouth 2 (two) times daily.   sucralfate  (CARAFATE ) 1 GM/10ML suspension Take 10 mLs (1 g total) by mouth 4 (four) times daily -  with meals and at bedtime.   tolterodine  (DETROL  LA) 4 MG 24 hr capsule Take 1 capsule (4 mg total) by mouth daily.   topiramate  (TOPAMAX ) 25 MG tablet Take 1 tablet (25 mg total) by mouth 2 (two) times daily.   [DISCONTINUED] famotidine  (PEPCID ) 20 MG tablet Take 1 tablet (20 mg total) by mouth 2 (two) times daily. (Patient taking differently: Take 20 mg by mouth 2 (two) times daily. As needed)   No facility-administered encounter medications on file as of 12/09/2023.    Allergies as of 12/09/2023 - Review Complete 12/09/2023  Allergen Reaction Noted   Demerol [meperidine] Nausea Only 01/16/2012    Past Medical History:  Diagnosis Date   Allergy    Arthritis    Asthma    Chicken pox    Complication of anesthesia    GERD (gastroesophageal reflux disease)    History of kidney stones    Migraine headache    PONV (postoperative nausea and vomiting)    Urine incontinence     Past Surgical History:  Procedure Laterality Date   BREAST CYST ASPIRATION Bilateral    neg   carpal tunnel sugery     bilateral   CHOLECYSTECTOMY N/A 11/04/2019   Procedure: LAPAROSCOPIC CHOLECYSTECTOMY WITH INTRAOPERATIVE CHOLANGIOGRAM;  Surgeon: Dessa Reyes ORN, MD;  Location: [facility] ORS;  Service: General;  Laterality: N/A;   COLONOSCOPY WITH PROPOFOL  N/A  09/20/2018   Procedure: COLONOSCOPY WITH PROPOFOL ;  Surgeon: Viktoria Lamar DASEN, MD;  Location: [facility] ENDOSCOPY;  Service: Endoscopy;  Laterality: N/A;   ELBOW SURGERY  1999   ESOPHAGEAL MANOMETRY N/A 07/22/2023   Procedure: MANOMETRY, ESOPHAGUS;  Surgeon: Shila Gustav GAILS, MD;  Location: [facility] ENDOSCOPY;  Service: Gastroenterology;  Laterality: N/A;   ESOPHAGOGASTRODUODENOSCOPY (EGD) WITH PROPOFOL  N/A 09/20/2018   Procedure: ESOPHAGOGASTRODUODENOSCOPY (EGD) WITH PROPOFOL ;  Surgeon: Viktoria Lamar DASEN, MD;  Location: [facility] ENDOSCOPY;  Service: Endoscopy;  Laterality: N/A;   HAND SURGERY  1999   broken finger - pins   NASAL SINUS SURGERY  1992   ROTATOR CUFF REPAIR  1994   Trigger finger surgery  1999 and 2000   VAGINAL HYSTERECTOMY  1987   secondary to fibroids    Family History  Problem Relation Age of Onset   Colon cancer Mother        colon   Asthma Mother    Heart disease Father    Hypertension Father    Colon cancer Sister    Stroke Maternal Grandfather    Diabetes Maternal Grandfather  Lung cancer Maternal Grandmother    Breast cancer Maternal Grandmother    Esophageal cancer Neg Hx    Pancreatic cancer Neg Hx    Liver disease Neg Hx    Stomach cancer Neg Hx     Social History   Socioeconomic History   Marital status: Married    Spouse name: Not on file   Number of children: Not on file   Years of education: Not on file   Highest education level: 12th grade  Occupational History   Not on file  Tobacco Use   Smoking status: Never   Smokeless tobacco: Never  Vaping Use   Vaping status: Never Used  Substance and Sexual Activity   Alcohol use: No   Drug use: No   Sexual activity: Yes    Birth control/protection: None, Post-menopausal  Other Topics Concern   Not on file  Social History Narrative   Not on file   Social Drivers of Health   Financial Resource Strain: Low Risk  (10/30/2023)   Overall Financial Resource Strain (CARDIA)    Difficulty of Paying  Living Expenses: Not hard at all  Food Insecurity: No Food Insecurity (10/30/2023)   Hunger Vital Sign    Worried About Running Out of Food in the Last Year: Never true    Ran Out of Food in the Last Year: Never true  Transportation Needs: No Transportation Needs (10/30/2023)   PRAPARE - Administrator, Civil Service (Medical): No    Lack of Transportation (Non-Medical): No  Physical Activity: Insufficiently Active (10/30/2023)   Exercise Vital Sign    Days of Exercise per Week: 3 days    Minutes of Exercise per Session: 10 min  Stress: No Stress Concern Present (10/30/2023)   [employer] of Occupational Health - Occupational Stress Questionnaire    Feeling of Stress: Only a little  Social Connections: Socially Integrated (10/30/2023)   Social Connection and Isolation Panel    Frequency of Communication with Friends and Family: Three times a week    Frequency of Social Gatherings with Friends and Family: Once a week    Attends Religious Services: More than 4 times per year    Active Member of [employer] or Organizations: Yes    Attends Engineer, structural: More than 4 times per year    Marital Status: Married  Catering manager Violence: Not At Risk (08/24/2023)   Humiliation, Afraid, Rape, and Kick questionnaire    Fear of Current or Ex-Partner: No    Emotionally Abused: No    Physically Abused: No    Sexually Abused: No      Review of systems: All other review of systems negative except as mentioned in the HPI.   Physical Exam: Vitals:   12/09/23 0948  BP: 136/84  Pulse: 78  SpO2: 96%   Body mass index is 35.97 kg/m. Gen:      No acute distress HEENT:  sclera anicteric Abd:      soft, non-tender; no palpable masses, no distension Ext:    No edema Neuro: alert and oriented x 3 Psych: normal mood and affect  Data Reviewed:  Reviewed labs, radiology imaging, old records and pertinent past GI work up     Assessment and Plan Assessment &  Plan Abdominal pain and cramping with associated constipation and nausea Severe abdominal pain and cramping 2-3 times weekly, lasting two days, with constipation and nausea. Infrequent bowel movements every 4-5 days, episodes of diarrhea, and dark urine suggest  dehydration. Dicyclomine  provides relief but may worsen constipation. Dehydration likely exacerbates symptoms. - Increase Miralax to one cap full twice daily - Increase water intake to 4-5 sixteen-ounce bottles daily - Try nortriptyline  10 mg at bedtime for pain modulation - Use dicyclomine  as needed, not daily - Provide samples of IB gard and FD gard for symptom relief - Provide samples of reflux gum for symptomatic relief  Gastroesophageal reflux disease (GERD) GERD symptoms improved with Dexilant , with occasional reflux episodes. Concerns about PPI use and bone loss noted, but current management is effective. Emphasize hydration and bowel regularity to prevent symptom exacerbation. - Continue Dexilant  - Ensure adequate vitamin D and calcium intake  Hiatal hernia (not currently planned for repair) Hiatal hernia repair not planned. PH testing deferred as symptoms are manageable with current treatment and she prefers to avoid surgery. Discussed wireless sensor via endoscopy and catheter through the nose for pH testing, both deferred. - Hold off on hiatal hernia repair and pH testing  Osteoporosis with elevated alkaline phosphatase Osteoporosis with slightly elevated alkaline phosphatase, likely due to bone turnover. Previous MRCP reassuring with no liver or pancreatic pathology. Alkaline phosphatase levels are not dangerously high, likely related to bone loss. - Continue vitamin D and calcium supplementation - Monitor alkaline phosphatase levels every 6 months     This visit required >40 minutes of patient care (this includes precharting, chart review, review of results, face-to-face time used for counseling as well as treatment  plan and follow-up. The patient was provided an opportunity to ask questions and all were answered. The patient agreed with the plan and demonstrated an understanding of the instructions.  LOIS Wilkie Mcgee , MD    CC: Glendia Shad, MD

## 2023-12-09 NOTE — Patient Instructions (Addendum)
 VISIT SUMMARY:  Today, we addressed your severe abdominal pain and cramping, which have been worsening over the past two weeks. We also discussed your GERD, hiatal hernia, and osteoporosis management.  YOUR PLAN:  ABDOMINAL PAIN AND CRAMPING WITH ASSOCIATED CONSTIPATION AND NAUSEA: You have been experiencing severe abdominal pain and cramping 2-3 times weekly, lasting two days, with constipation and nausea. Your infrequent bowel movements and episodes of diarrhea suggest dehydration. -Increase Miralax to one cap full twice daily. -Increase water intake to 4-5 sixteen-ounce bottles daily. -Try nortriptyline  10 mg at bedtime for pain modulation. -Use dicyclomine  as needed, not daily. -Use the provided samples of IBgard and FDgard for symptom relief. -Use the provided samples of reflux gum for symptomatic relief.  GASTROESOPHAGEAL REFLUX DISEASE (GERD): Your GERD symptoms have improved with Dexilant , with occasional reflux episodes. -Continue taking Dexilant . -Ensure adequate vitamin D and calcium intake.  HIATAL HERNIA: Your hiatal hernia is currently managed without surgery, and pH testing is deferred as your symptoms are manageable. -Hold off on hiatal hernia repair and pH testing for now.  OSTEOPOROSIS WITH ELEVATED ALKALINE PHOSPHATASE: You have osteoporosis with slightly elevated alkaline phosphatase, likely due to bone turnover. Previous tests showed no liver or pancreatic issues. -Continue taking vitamin D and calcium supplements. -Monitor alkaline phosphatase levels every 6 months.   _______________________________________________________  If your blood pressure at your visit was 140/90 or greater, please contact your primary care physician to follow up on this.  _______________________________________________________  If you are age 49 or older, your body mass index should be between 23-30. Your Body mass index is 35.97 kg/m. If this is out of the aforementioned range listed,  please consider follow up with your Primary Care Provider.  If you are age 33 or younger, your body mass index should be between 19-25. Your Body mass index is 35.97 kg/m. If this is out of the aformentioned range listed, please consider follow up with your Primary Care Provider.   ________________________________________________________  The [facility] providers would like to encourage you to use MYCHART to communicate with providers for non-urgent requests or questions.  Due to long hold times on the telephone, sending your provider a message by [facility] may be a faster and more efficient way to get a response.  Please allow 48 business hours for a response.  Please remember that this is for non-urgent requests.  _______________________________________________________  [facility] is using a team-based approach to care.  Your team is made up of your doctor and two to three APPS. Our APPS (Nurse Practitioners and Physician Assistants) work with your physician to ensure care continuity for you. They are fully qualified to address your health concerns and develop a treatment plan. They communicate directly with your gastroenterologist to care for you. Seeing the Advanced Practice Practitioners on your physician's team can help you by facilitating care more promptly, often allowing for earlier appointments, access to diagnostic testing, procedures, and other specialty referrals.

## 2023-12-30 ENCOUNTER — Encounter: Payer: Self-pay | Admitting: Family

## 2023-12-30 ENCOUNTER — Ambulatory Visit: Payer: Self-pay

## 2023-12-30 ENCOUNTER — Ambulatory Visit (INDEPENDENT_AMBULATORY_CARE_PROVIDER_SITE_OTHER): Admitting: Family

## 2023-12-30 ENCOUNTER — Telehealth: Payer: Self-pay

## 2023-12-30 VITALS — BP 124/82 | HR 100 | Temp 98.3°F | Ht 61.0 in | Wt 188.0 lb

## 2023-12-30 DIAGNOSIS — J4530 Mild persistent asthma, uncomplicated: Secondary | ICD-10-CM | POA: Diagnosis not present

## 2023-12-30 DIAGNOSIS — R519 Headache, unspecified: Secondary | ICD-10-CM | POA: Diagnosis not present

## 2023-12-30 DIAGNOSIS — U071 COVID-19: Secondary | ICD-10-CM

## 2023-12-30 DIAGNOSIS — J209 Acute bronchitis, unspecified: Secondary | ICD-10-CM

## 2023-12-30 DIAGNOSIS — J440 Chronic obstructive pulmonary disease with acute lower respiratory infection: Secondary | ICD-10-CM | POA: Diagnosis not present

## 2023-12-30 DIAGNOSIS — R051 Acute cough: Secondary | ICD-10-CM

## 2023-12-30 LAB — POCT INFLUENZA A/B
Influenza A, POC: NEGATIVE
Influenza B, POC: NEGATIVE

## 2023-12-30 LAB — POC COVID19 BINAXNOW: SARS Coronavirus 2 Ag: POSITIVE — AB

## 2023-12-30 MED ORDER — NIRMATRELVIR/RITONAVIR (PAXLOVID)TABLET: 3.0000 | ORAL_TABLET | Freq: Two times a day (BID) | ORAL | 0 refills | Status: DC

## 2023-12-30 MED ORDER — ALBUTEROL SULFATE HFA 108 (90 BASE) MCG/ACT IN AERS: 2.0000 | INHALATION_SPRAY | Freq: Four times a day (QID) | RESPIRATORY_TRACT | 10 refills | Status: DC | PRN

## 2023-12-30 MED ORDER — HYDROCODONE BIT-HOMATROP MBR 5-1.5 MG/5ML PO SOLN: 5.0000 mL | Freq: Every evening | ORAL | 0 refills | Status: DC | PRN

## 2023-12-30 NOTE — Patient Instructions (Signed)
 Codeine  based cough syrup  Please take cough medication at night only as needed. As we discussed, I do not recommend dosing throughout the day as coughing is a protective mechanism . It also helps to break up thick mucous.  Do not take cough suppressants with alcohol as can lead to trouble breathing. Advise caution if taking cough suppressant and operating machinery ( i.e driving a car) as you may feel very tired.  It is imperative that you store this medication away from children, pets.  Please store this in a safe place so that no one else is able to use     You no longer have to quarantine per cdc guidelines as long as you are fever free without antipyretic (Tylenol  or ibuprofen) for 24 hours.    We discussed starting Paxlovid .  There are benefits and risks of taking this treatment as outlined in the "Fact Sheet for Patients and Caregivers." You may find this document here and please read in detail   http://galloway.com/   I have sent Paxlovid to your pharmacy. Please call pharmacy so they bring medication out to your car and you do not have to go inside.   PAXLOVID ADMINISTRATION INSTRUCTIONS:  Take with or without food. Swallow the tablets whole. Don't chew, crush, or break the medications because it might not work as well  For each dose of the medication, you should be taking 3 tablets together (2 pink oval and 1 white oval) TWICE a day for FIVE days   Finish your full five-day course of Paxlovid even if you feel better before you're done. Stopping this medication too early can make it less effective to prevent severe illness related to COVID19.    Paxlovid is prescribed for YOU ONLY. Don't share it with others, even if they have similar symptoms as you. This medication might not be right for everyone.  Make sure to take steps to protect yourself and others while you're taking this medication in order to get well soon and to prevent others from getting sick  with COVID-19.  Paxlovid (nirmatrelvir / ritonavir) can cause hormonal birth control medications to not work well. If you or your partner is currently taking hormonal birth control, use condoms or other birth control methods to prevent unintended pregnancies.   COMMON SIDE EFFECTS: Altered or bad taste in your mouth  Diarrhea  High blood pressure (1% of people) Muscle aches (1% of people)    If your COVID-19 symptoms get worse, get medical help right away. Call 911 if you experience symptoms such as worsening cough, trouble breathing, chest pain that doesn't go away, confusion, a hard time staying awake, and pale or blue-colored skin.This medication won't prevent all COVID-19 cases from getting worse.

## 2023-12-30 NOTE — Telephone Encounter (Unsigned)
 Copied from CRM #[id number]. Topic: Clinical - Prescription Issue >> Dec 30, 2023  1:21 PM Terri G wrote: Reason for CRM: Arland from [facility] pharmacy calling regarding they don't have any PAXLOVID in stock so it will needs to be sent to another location. Stated she will contact patient as well.

## 2023-12-30 NOTE — Telephone Encounter (Signed)
 Patient reports nonproductive cough that is moderate in severity along with headache, chills and fatigue. Patient is scheduled for an acute visit today at 12 PM  FYI Only or Action Required?: FYI only for provider.  Patient was last seen in primary care on 11/02/2023 by Glendia Shad, MD.  Called Nurse Triage reporting Cough.  Symptoms began Sunday evening.  Interventions attempted: OTC medications: Mucinex  and Rest, hydration, or home remedies.  Symptoms are: unchanged.  Triage Disposition: See Physician Within 24 Hours  Patient/caregiver understands and will follow disposition?: Yes  Copied from CRM #[id number]. Topic: Clinical - Red Word Triage >> Dec 30, 2023 11:10 AM Shereese L wrote: [facility] that prompted transfer to Nurse Triage: Severe, coughing, headache and chill Reason for Disposition  [1] Continuous (nonstop) coughing interferes with work or school AND [2] no improvement using cough treatment per Care Advice  Answer Assessment - Initial Assessment Questions 1. ONSET: When did the cough begin?      Started Sunday night 2. SEVERITY: How bad is the cough today?      moderate 3. SPUTUM: Describe the color of your sputum (e.g., none, dry cough; clear, white, yellow, green)     dry 4. HEMOPTYSIS: Are you coughing up any blood? If Yes, ask: How much? (e.g., flecks, streaks, tablespoons, etc.)     no 5. DIFFICULTY BREATHING: Are you having difficulty breathing? If Yes, ask: How bad is it? (e.g., mild, moderate, severe)      no 6. FEVER: Do you have a fever? If Yes, ask: What is your temperature, how was it measured, and when did it start?     no 7. CARDIAC HISTORY: Do you have any history of heart disease? (e.g., heart attack, congestive heart failure)      no 8. LUNG HISTORY: Do you have any history of lung disease?  (e.g., pulmonary embolus, asthma, emphysema)     yes 9. PE RISK FACTORS: Do you have a history of blood clots? (or: recent major  surgery, recent prolonged travel, bedridden)     no 10. OTHER SYMPTOMS: Do you have any other symptoms? (e.g., runny nose, wheezing, chest pain)       Chills, headache 12. TRAVEL: Have you traveled out of the country in the last month? (e.g., travel history, exposures)       no  Protocols used: Cough - Acute Non-Productive-A-AH

## 2023-12-30 NOTE — Assessment & Plan Note (Signed)
 Afebrile. Non toxic in appearance. Covid positive. Criteria met for consideration of Paxlovid,  patient older than 12 years and weight > 40kg, started within 5 days of symptom onset and risk factor for severe disease include:  GFR > 60.  Counseled on adverse effects including altered taste, diarrhea, HTN, and myalgia.   Patient is most comfortable and desires to start Paxlovid and understands to call me with concerns or new symptoms. Provided hycodan cough syrup for bedtime and counseled on sedation.

## 2023-12-30 NOTE — Progress Notes (Signed)
 Assessment & Plan:  COVID-19 Assessment & Plan: Afebrile. Non toxic in appearance. Covid positive. Criteria met for consideration of Paxlovid,  patient older than 12 years and weight > 40kg, started within 5 days of symptom onset and risk factor for severe disease include:  GFR > 60.  Counseled on adverse effects including altered taste, diarrhea, HTN, and myalgia.   Patient is most comfortable and desires to start Paxlovid and understands to call me with concerns or new symptoms. Provided hycodan cough syrup for bedtime and counseled on sedation.    Orders: -     POC COVID-19 BinaxNow -     POCT Influenza A/B -     nirmatrelvir/ritonavir; Take 3 tablets by mouth 2 (two) times daily for 5 days. (Take nirmatrelvir 150 mg two tablets twice daily for 5 days and ritonavir 100 mg one tablet twice daily for 5 days) Patient GFR is 69  Dispense: 30 tablet; Refill: 0 -     HYDROcodone  Bit-Homatrop MBr; Take 5 mLs by mouth at bedtime as needed for cough.  Dispense: 35 mL; Refill: 0  Acute bronchitis with COPD (HCC)  Mild persistent asthma without complication -     Albuterol  Sulfate HFA; Inhale 2 puffs into the lungs every 6 (six) hours as needed for wheezing or shortness of breath.  Dispense: 1 each; Refill: 10  Acute cough -     POC COVID-19 BinaxNow -     POCT Influenza A/B -     nirmatrelvir/ritonavir; Take 3 tablets by mouth 2 (two) times daily for 5 days. (Take nirmatrelvir 150 mg two tablets twice daily for 5 days and ritonavir 100 mg one tablet twice daily for 5 days) Patient GFR is 69  Dispense: 30 tablet; Refill: 0  Nonintractable headache, unspecified chronicity pattern, unspecified headache type     Return precautions given.   Risks, benefits, and alternatives of the medications and treatment plan prescribed today were discussed, and patient expressed understanding.   Education regarding symptom management and diagnosis given to patient on AVS either electronically or  printed.  No follow-ups on file.  Rollene Northern, FNP  Subjective:    Patient ID: Savannah Gomez, female    DOB: 1955/07/30, 68 y.o.   MRN: [id number]  CC: Savannah Gomez is a 68 y.o. female who presents today for an acute visit.    HPI: Complains of dry cough x   4 days, unchanged.  Endorses chills, sore throat, hoarseness, BL temporal HA, episodic wheezing ' a tiny bit'.    episodic nausea which has been chronic for her.  She is staying hydrated.   Denies fever, myalgia, vomiting, abdominal pain, vision changes.  She has been taking tylenol  500mg  , mucinex , delsym, without relief.   H/o asthma, GERD, migraine    Never smoker She has taken codeine  based cough syrups in the past.  Allergies: Demerol [meperidine] Current Outpatient Medications on File Prior to Visit  Medication Sig Dispense Refill   acetaminophen  (TYLENOL ) 500 MG tablet Take 1,000 mg by mouth daily as needed for mild pain or headache.      dexlansoprazole  (DEXILANT ) 60 MG capsule Take 1 capsule (60 mg total) by mouth daily. 90 capsule 3   dicyclomine  (BENTYL ) 10 MG capsule Take 1 capsule (10 mg total) by mouth 3 (three) times daily as needed for spasms (for abdominal pain and cramping). 90 capsule 6   famotidine  (PEPCID ) 20 MG tablet Take 20 mg by mouth 2 (two) times daily as needed for  heartburn or indigestion.     Fluticasone  Furoate (ARNUITY ELLIPTA ) 100 MCG/ACT AEPB Inhale 1 puff into the lungs daily. 30 each 5   imipramine  (TOFRANIL ) 25 MG tablet Take 1 tablet (25 mg total) by mouth 2 (two) times daily. 180 tablet 3   nortriptyline  (PAMELOR ) 10 MG capsule Take 1 capsule (10 mg total) by mouth at bedtime. 30 capsule 6   sucralfate  (CARAFATE ) 1 GM/10ML suspension Take 10 mLs (1 g total) by mouth 4 (four) times daily -  with meals and at bedtime. 1200 mL 2   tolterodine  (DETROL  LA) 4 MG 24 hr capsule Take 1 capsule (4 mg total) by mouth daily. 90 capsule 3   topiramate  (TOPAMAX ) 25 MG tablet Take 1  tablet (25 mg total) by mouth 2 (two) times daily. 180 tablet 1   No current facility-administered medications on file prior to visit.    Review of Systems  Constitutional:  Positive for chills. Negative for fever.  HENT:  Positive for voice change. Negative for congestion, ear pain, sinus pressure and sneezing.   Respiratory:  Positive for cough and wheezing. Negative for shortness of breath.   Cardiovascular:  Negative for chest pain and palpitations.  Gastrointestinal:  Negative for nausea and vomiting.      Objective:    BP 124/82   Pulse 100   Temp 98.3 F (36.8 C) (Oral)   Ht 5' 1 (1.549 m)   Wt 188 lb (85.3 kg)   SpO2 98%   BMI 35.52 kg/m   BP Readings from Last 3 Encounters:  12/30/23 124/82  12/09/23 136/84  11/25/23 123/78   Wt Readings from Last 3 Encounters:  12/30/23 188 lb (85.3 kg)  12/09/23 190 lb 6 oz (86.4 kg)  11/25/23 191 lb (86.6 kg)    Physical Exam Vitals reviewed.  Constitutional:      Appearance: She is well-developed.  HENT:     Head: Normocephalic and atraumatic.     Right Ear: Hearing, tympanic membrane, ear canal and external ear normal. No decreased hearing noted. No drainage, swelling or tenderness. No middle ear effusion. No foreign body. Tympanic membrane is not erythematous or bulging.     Left Ear: Hearing, tympanic membrane, ear canal and external ear normal. No decreased hearing noted. No drainage, swelling or tenderness.  No middle ear effusion. No foreign body. Tympanic membrane is not erythematous or bulging.     Nose: Nose normal. No rhinorrhea.     Right Sinus: No maxillary sinus tenderness or frontal sinus tenderness.     Left Sinus: No maxillary sinus tenderness or frontal sinus tenderness.     Mouth/Throat:     Pharynx: Uvula midline. Posterior oropharyngeal erythema present. No oropharyngeal exudate.     Tonsils: No tonsillar abscesses.     Comments: Tonsils well beyond pillars. Eyes:     Conjunctiva/sclera:  Conjunctivae normal.  Cardiovascular:     Rate and Rhythm: Regular rhythm.     Pulses: Normal pulses.     Heart sounds: Normal heart sounds.  Pulmonary:     Effort: Pulmonary effort is normal.     Breath sounds: Normal breath sounds. No wheezing, rhonchi or rales.  Lymphadenopathy:     Head:     Right side of head: No submental, submandibular, tonsillar, preauricular, posterior auricular or occipital adenopathy.     Left side of head: No submental, submandibular, tonsillar, preauricular, posterior auricular or occipital adenopathy.     Cervical: No cervical adenopathy.  Skin:  General: Skin is warm and dry.  Neurological:     Mental Status: She is alert.  Psychiatric:        Speech: Speech normal.        Behavior: Behavior normal.        Thought Content: Thought content normal.

## 2023-12-30 NOTE — Telephone Encounter (Signed)
 Copied from CRM [id number]. Topic: Clinical - Prescription Issue >> Dec 30, 2023  1:34 PM Alfonso HERO wrote: Reason for CRM: Dawn from [facility] called saying that the patient wants her medication transferred to [facility] in [place]. Because she can't transfer the controlled substance can you please send the Rx to them directly so it can be filled along with the other 2 Rx's.   HYDROcodone  bit-homatropine (HYCODAN) 5-1.5 MG/5ML syrup albuterol  (VENTOLIN  HFA) 108 (90 Base) MCG/ACT inhaler nirmatrelvir/ritonavir (PAXLOVID) 20 x 150 MG & 10 x 100MG  TABS

## 2023-12-31 ENCOUNTER — Ambulatory Visit: Admitting: Internal Medicine

## 2023-12-31 ENCOUNTER — Other Ambulatory Visit (HOSPITAL_COMMUNITY): Payer: Self-pay

## 2023-12-31 MED ORDER — HYDROCODONE BIT-HOMATROP MBR 5-1.5 MG/5ML PO SOLN: 5.0000 mL | Freq: Every evening | ORAL | 0 refills | Status: DC | PRN

## 2023-12-31 MED ORDER — ALBUTEROL SULFATE HFA 108 (90 BASE) MCG/ACT IN AERS
2.0000 | INHALATION_SPRAY | Freq: Four times a day (QID) | RESPIRATORY_TRACT | 10 refills | Status: AC | PRN
Start: 2023-12-31 — End: ?

## 2023-12-31 MED ORDER — NIRMATRELVIR/RITONAVIR (PAXLOVID)TABLET: 3.0000 | ORAL_TABLET | Freq: Two times a day (BID) | ORAL | 0 refills | Status: AC

## 2023-12-31 NOTE — Telephone Encounter (Signed)
 Called pt to let her know she is aware

## 2023-12-31 NOTE — Addendum Note (Signed)
 Addended by: DINEEN ROLLENE MATSU on: 12/31/2023 08:35 AM   Modules accepted: Orders

## 2023-12-31 NOTE — Telephone Encounter (Signed)
 I was not in the office yesterday pm. Please call and see how she is doing. Need to confirm if was able to get medication. Per review, saw Rollene 12/30/23.

## 2023-12-31 NOTE — Telephone Encounter (Signed)
 Patient is doing ok- just does not feel well. She is aware that both providers were out of office yesterday PM. Per Rollene, prescriptions have been resent.

## 2024-01-14 ENCOUNTER — Other Ambulatory Visit: Payer: Self-pay | Admitting: Internal Medicine

## 2024-01-14 DIAGNOSIS — U071 COVID-19: Secondary | ICD-10-CM

## 2024-01-14 NOTE — Telephone Encounter (Unsigned)
 Copied from CRM [id number]. Topic: Clinical - Medication Refill >> Jan 14, 2024 11:01 AM Ashley R wrote: Medication: HYDROcodone  bit-homatropine (HYCODAN) 5-1.5 MG/5ML syrup  Has the patient contacted their pharmacy? Yes - needs called in (controlled) (Agent: If no, request that the patient contact the pharmacy for the refill. If patient does not wish to contact the pharmacy document the reason why and proceed with request.) (Agent: If yes, when and what did the pharmacy advise?)  This is the patient's preferred pharmacy:  [id number] [place], [place] - [address] AT [facility] OF [address] [place] [address] Phone: [phone] Fax: [phone]   Is this the correct pharmacy for this prescription? Yes If no, delete pharmacy and type the correct one.   Has the prescription been filled recently? Yes  Is the patient out of the medication? Yes, for several days  Has the patient been seen for an appointment in the last year OR does the patient have an upcoming appointment? Yes  Can we respond through MyChart? Yes  Agent: Please be advised that Rx refills may take up to 3 business days. We ask that you follow-up with your pharmacy.

## 2024-01-15 NOTE — Telephone Encounter (Signed)
 If having increased cough, needs to be evaluated. This is not something we just routinely refill. Confirm doing ok. No sob. Needs to be evaluated to confirm diagnosis and best treatment

## 2024-01-15 NOTE — Telephone Encounter (Signed)
 Return call to patient and made her aware of Dr Freda recommendations. Patient states she is having some lingering coughing and very mild shortness of breath after being diagnosed with COVID. Patient states she is using the inhaler that [facility] prescribed and it helps a little. Patient is scheduled with Dr Narendra on Wednesday 01/20/24 at 1:00 pm. Patient verbalized understanding and has no further questions.

## 2024-01-20 ENCOUNTER — Ambulatory Visit: Admitting: Internal Medicine

## 2024-01-20 ENCOUNTER — Ambulatory Visit
Admission: RE | Admit: 2024-01-20 | Discharge: 2024-01-20 | Disposition: A | Source: Home / Self Care | Attending: Internal Medicine | Admitting: Internal Medicine

## 2024-01-20 ENCOUNTER — Encounter: Payer: Self-pay | Admitting: Internal Medicine

## 2024-01-20 ENCOUNTER — Ambulatory Visit
Admission: RE | Admit: 2024-01-20 | Discharge: 2024-01-20 | Disposition: A | Discharge status: Home / Self Care | Source: Ambulatory Visit | Attending: Internal Medicine | Admitting: Internal Medicine

## 2024-01-20 VITALS — BP 122/78 | HR 87 | Temp 97.7°F | Ht 61.0 in | Wt 190.2 lb

## 2024-01-20 DIAGNOSIS — R0602 Shortness of breath: Secondary | ICD-10-CM | POA: Diagnosis not present

## 2024-01-20 DIAGNOSIS — U071 COVID-19: Secondary | ICD-10-CM

## 2024-01-20 DIAGNOSIS — Z8616 Personal history of COVID-19: Secondary | ICD-10-CM | POA: Diagnosis not present

## 2024-01-20 DIAGNOSIS — J4530 Mild persistent asthma, uncomplicated: Secondary | ICD-10-CM | POA: Diagnosis not present

## 2024-01-20 DIAGNOSIS — J45909 Unspecified asthma, uncomplicated: Secondary | ICD-10-CM | POA: Diagnosis not present

## 2024-01-20 DIAGNOSIS — R053 Chronic cough: Secondary | ICD-10-CM | POA: Diagnosis not present

## 2024-01-20 MED ORDER — HYDROCODONE BIT-HOMATROP MBR 5-1.5 MG/5ML PO SOLN: 5.0000 mL | Freq: Every evening | ORAL | 0 refills | Status: DC | PRN

## 2024-01-20 NOTE — Assessment & Plan Note (Signed)
-   Patient was diagnosed with COVID-19 infection on October 8 and was prescribed Paxlovid for this -Patient states that she has been slowly getting better but still has some persistent cough and slight shortness of breath -She was complains of occasional chills and occasional shooting pain in her left ear -On exam, her lungs are clear to auscultation bilaterally.  No evidence of otitis media on ear exam -Will obtain chest x-ray to rule out superimposed bacterial infection but this appears less likely as her symptoms appear to be slowly improving -No indication for antibiotics at this time -Given the patient does have some persistent cough we will refill her cough syrup for her x 1. -No further workup at this time

## 2024-01-20 NOTE — Progress Notes (Signed)
 Acute Office Visit  Subjective:     Patient ID: Savannah Gomez, female    DOB: January 29, 1956, 68 y.o.   MRN: [id number]  Chief Complaint  Patient presents with   Acute Visit    Lingering cough & slight SOB x 2 weeks since covid diagnosis and treatment   Discussed the use of AI scribe software for clinical note transcription with the patient, who gave verbal consent to proceed.  History of Present Illness Savannah Gomez is a 68 year old female with asthma who presents with persistent symptoms following a COVID-19 infection.  Post-acute covid-19 symptoms - Diagnosed with COVID-19 approximately two weeks ago - Prescribed Paxlovid, which caused significant nausea and vomiting but was perceived as beneficial for recovery - Bedridden for approximately three days - Recovery has been slow with fluctuating symptoms, alternating between improvement and worsening - Has not returned to baseline health  Cough and upper respiratory symptoms - Persistent cough, less severe than at onset - No fever, but intermittent chills on days of feeling unwell - Yellow phlegm production - No nasal congestion, ear pain, or discharge - Occasional shooting pain in the ear - Frequent need to clear throat - No sore throat  Dyspnea and asthma exacerbation - History of asthma with worsened shortness of breath since COVID-19 infection - Mild exertional shortness of breath, improved with resumption of inhaler therapy - No significant wheezing - Shortness of breath remains similar to initial COVID-19 presentation  Gastrointestinal symptoms - Chronic gastrointestinal symptoms contributing to nausea, exacerbated by Paxlovid - No changes in bowel movements - Gastrointestinal symptoms consistent with chronic baseline condition    Review of Systems  Constitutional:  Positive for chills and malaise/fatigue. Negative for fever.  HENT:  Positive for ear pain.        Occasional pain in left ear   Respiratory:  Positive for cough, sputum production and shortness of breath. Negative for hemoptysis and wheezing.   Cardiovascular: Negative.   Gastrointestinal: Negative.   Musculoskeletal: Negative.   Neurological: Negative.   Psychiatric/Behavioral: Negative.          Objective:    BP 122/78   Pulse 87   Temp 97.7 F (36.5 C)   Ht 5' 1 (1.549 m)   Wt 190 lb 3.2 oz (86.3 kg)   SpO2 98%   BMI 35.94 kg/m    Physical Exam Constitutional:      Appearance: Normal appearance.  HENT:     Head: Normocephalic and atraumatic.     Right Ear: Tympanic membrane, ear canal and external ear normal.     Left Ear: Tympanic membrane, ear canal and external ear normal.     Nose:     Right Sinus: No maxillary sinus tenderness or frontal sinus tenderness.     Left Sinus: No maxillary sinus tenderness or frontal sinus tenderness.     Mouth/Throat:     Mouth: Mucous membranes are moist.     Pharynx: Oropharynx is clear. No oropharyngeal exudate or posterior oropharyngeal erythema.  Cardiovascular:     Rate and Rhythm: Normal rate and regular rhythm.  Pulmonary:     Effort: Pulmonary effort is normal.     Breath sounds: Normal breath sounds. No wheezing, rhonchi or rales.  Abdominal:     General: Bowel sounds are normal. There is no distension.     Palpations: Abdomen is soft.     Tenderness: There is no abdominal tenderness.  Musculoskeletal:  General: No swelling or tenderness.     Right lower leg: No edema.     Left lower leg: No edema.  Neurological:     Mental Status: She is alert.  Psychiatric:        Mood and Affect: Mood normal.        Behavior: Behavior normal.     No results found for any visits on 01/20/24.      Assessment & Plan:   Problem List Items Addressed This Visit       Respiratory   Asthma   - This problem is chronic and stable -Patient states that she has had some mild shortness of breath since being diagnosed with COVID but this symptom  is not worsening -She is on albuterol  inhaler 2 puffs every 6 hours as needed and states that this has helped with her symptoms -Her lungs are clear to auscultation on exam -Will obtain chest x-ray for further evaluation -No indication for systemic steroids at this time.  Patient may benefit from a combined beta agonist/ICS inhaler for her asthma as opposed to albuterol  alone.  Will consider this if her symptoms are not improving        Other   COVID-19 - Primary   - Patient was diagnosed with COVID-19 infection on October 8 and was prescribed Paxlovid for this -Patient states that she has been slowly getting better but still has some persistent cough and slight shortness of breath -She was complains of occasional chills and occasional shooting pain in her left ear -On exam, her lungs are clear to auscultation bilaterally.  No evidence of otitis media on ear exam -Will obtain chest x-ray to rule out superimposed bacterial infection but this appears less likely as her symptoms appear to be slowly improving -No indication for antibiotics at this time -Given the patient does have some persistent cough we will refill her cough syrup for her x 1. -No further workup at this time      Relevant Medications   HYDROcodone  bit-homatropine (HYCODAN) 5-1.5 MG/5ML syrup   Other Relevant Orders   DG Chest 2 View    Meds ordered this encounter  Medications   HYDROcodone  bit-homatropine (HYCODAN) 5-1.5 MG/5ML syrup    Sig: Take 5 mLs by mouth at bedtime as needed for cough.    Dispense:  35 mL    Refill:  0    No follow-ups on file.  Ayla Dunigan, MD

## 2024-01-20 NOTE — Patient Instructions (Addendum)
  VISIT SUMMARY: You were seen today for persistent symptoms following a COVID-19 infection, including a cough and shortness of breath. Your recovery has been slow, and you have not yet returned to your baseline health.  YOUR PLAN: -COVID-19 INFECTION, RECOVERING: You were diagnosed with COVID-19 about two weeks ago and experienced symptoms like nausea, vomiting, chills, and cough. While your symptoms are improving, you still have occasional chills and a persistent cough. We will order a chest x-ray to rule out any bacterial infection and refill your cough syrup to help manage the cough. Please return if your symptoms worsen, such as increased cough, wheezing, or shortness of breath.  -ASTHMA WITH INCREASED SYMPTOMS POST-COVID: Your asthma symptoms have worsened since your COVID-19 infection, causing more shortness of breath than usual. This is likely due to the recent viral infection.  It seems that your symptoms are currently well-controlled on your inhaler.. If you notice any wheezing or significant respiratory symptoms, we may need to reevaluate you  -PERSISTENT COUGH: You have a persistent cough that has been improving but is still present, especially at night. There is no nasal congestion or significant throat issues, but you do have occasional ear pain. We will refill your cough syrup to help manage the nighttime cough and improve your sleep. A chest x-ray will be ordered to ensure there are no underlying issues contributing to the cough.  INSTRUCTIONS: Please get a chest x-ray as ordered. Continue using your inhaler for asthma management and take the cough syrup as prescribed. Return to the clinic if your symptoms worsen, including increased cough, wheezing, or shortness of breath.                      Contains text generated by Abridge.                                 Contains text generated by Abridge.

## 2024-01-20 NOTE — Assessment & Plan Note (Signed)
-   This problem is chronic and stable -Patient states that she has had some mild shortness of breath since being diagnosed with COVID but this symptom is not worsening -She is on albuterol  inhaler 2 puffs every 6 hours as needed and states that this has helped with her symptoms -Her lungs are clear to auscultation on exam -Will obtain chest x-ray for further evaluation -No indication for systemic steroids at this time.  Patient may benefit from a combined beta agonist/ICS inhaler for her asthma as opposed to albuterol  alone.  Will consider this if her symptoms are not improving

## 2024-01-22 ENCOUNTER — Ambulatory Visit: Payer: Self-pay | Admitting: Internal Medicine

## 2024-02-01 ENCOUNTER — Other Ambulatory Visit: Payer: Self-pay | Admitting: Urology

## 2024-02-01 DIAGNOSIS — R350 Frequency of micturition: Secondary | ICD-10-CM

## 2024-02-02 ENCOUNTER — Ambulatory Visit: Admitting: Internal Medicine

## 2024-02-02 ENCOUNTER — Encounter: Payer: Self-pay | Admitting: Internal Medicine

## 2024-02-02 VITALS — BP 130/80 | HR 88 | Temp 98.1°F | Wt 193.0 lb

## 2024-02-02 DIAGNOSIS — U071 COVID-19: Secondary | ICD-10-CM

## 2024-02-02 DIAGNOSIS — J4520 Mild intermittent asthma, uncomplicated: Secondary | ICD-10-CM

## 2024-02-02 MED ORDER — PREDNISONE 10 MG PO TABS: 10.0000 mg | ORAL_TABLET | Freq: Every day | ORAL | 1 refills | Status: DC

## 2024-02-02 NOTE — Patient Instructions (Addendum)
 Avoid Allergens and Irritants Avoid secondhand smoke Avoid SICK contacts Recommend  Masking  when appropriate Recommend Keep up-to-date with vaccinations  Start prednisone  10 mg daily for 5 days If symptoms do not improve recommend restarting Arnuity

## 2024-02-02 NOTE — Progress Notes (Signed)
 [facility] Pulmonary Medicine Consultation     MRN# [id number] Savannah Gomez 1956/02/12  Brief History: 68 yo with Hx of Asthma (positive MCH), stopped meds, and presented back to [facility] with chronic cough, now on Arnuity with significant improvement.  Dx with ASTHMA 15 years ago Pneumonia 2 years ago Triggers-pollen, cats,dogs    TEST/EVENTS :    Pulmonary function test 12/11/2015 FEV1 92% FEV1/FVC 80% RV 106% TLC 102% DLCO corrected and her 42% Impression: Nonobstructive process by spirometry, no significant response to bronchodilators. Mild scooping of end expiratory curve, consistent with a mild obstructive process. Overall fits the clinical diagnosis of asthma.   6 minute walk test total distance 1240 feet/378 m low saturation 99%, highest heart rate 78    CC  Follow-up assessment for asthma   HPI Persistent cough and shortness of breath status post COVID-19 infection, COVID-19 3 weeks ago, still has a dry cough some intermittent wheezing Patient received Paxlovid symptoms have resolved however has residual cough Plan to start low-dose prednisone  therapy for 5 days  Assessment of asthma No exacerbation at this time No evidence of heart failure at this time No evidence or signs of infection at this time No respiratory distress No fevers, chills, nausea, vomiting, diarrhea No evidence of lower extremity edema No evidence hemoptysis No maintenance therapy at this time   Triggers seasonal, smoke, exercise, cats/dogs Carpet in bedrooms   Chest x-ray January 20, 2024 Independently reviewed by me today Findings reviewed with the patient No significant findings no pneumonia no edema      Current Outpatient Medications:    acetaminophen  (TYLENOL ) 500 MG tablet, Take 1,000 mg by mouth daily as needed for mild pain or headache. , Disp: , Rfl:    albuterol  (VENTOLIN  HFA) 108 (90 Base) MCG/ACT inhaler, Inhale 2 puffs into the lungs every 6 (six) hours as  needed for wheezing or shortness of breath., Disp: 1 each, Rfl: 10   dexlansoprazole  (DEXILANT ) 60 MG capsule, Take 1 capsule (60 mg total) by mouth daily., Disp: 90 capsule, Rfl: 3   dicyclomine  (BENTYL ) 10 MG capsule, Take 1 capsule (10 mg total) by mouth 3 (three) times daily as needed for spasms (for abdominal pain and cramping)., Disp: 90 capsule, Rfl: 6   famotidine  (PEPCID ) 20 MG tablet, Take 20 mg by mouth 2 (two) times daily as needed for heartburn or indigestion., Disp: , Rfl:    Fluticasone  Furoate (ARNUITY ELLIPTA ) 100 MCG/ACT AEPB, Inhale 1 puff into the lungs daily., Disp: 30 each, Rfl: 5   HYDROcodone  bit-homatropine (HYCODAN) 5-1.5 MG/5ML syrup, Take 5 mLs by mouth at bedtime as needed for cough., Disp: 35 mL, Rfl: 0   imipramine  (TOFRANIL ) 25 MG tablet, Take 1 tablet (25 mg total) by mouth 2 (two) times daily., Disp: 180 tablet, Rfl: 3   nortriptyline  (PAMELOR ) 10 MG capsule, Take 1 capsule (10 mg total) by mouth at bedtime., Disp: 30 capsule, Rfl: 6   sucralfate  (CARAFATE ) 1 GM/10ML suspension, Take 10 mLs (1 g total) by mouth 4 (four) times daily -  with meals and at bedtime., Disp: 1200 mL, Rfl: 2   tolterodine  (DETROL  LA) 4 MG 24 hr capsule, TAKE 1 CAPSULE BY MOUTH DAILY, Disp: 90 capsule, Rfl: 3   topiramate  (TOPAMAX ) 25 MG tablet, Take 1 tablet (25 mg total) by mouth 2 (two) times daily., Disp: 180 tablet, Rfl: 1  BP 130/80   Pulse 88   Temp 98.1 F (36.7 C)   Wt 193 lb (87.5 kg)  SpO2 99%   BMI 36.47 kg/m   Physical Examination:  General Appearance: No distress  EYES EOM intact.   NECK Supple, No JVD Pulmonary: normal breath sounds, No wheezing.  CardiovascularNormal S1,S2.  No m/r/g.   Ext pulses intact, cap refill intact  ALL OTHER ROS ARE NEGATIVE      Assessment and Plan:  68 year old pleasant white female seen today for follow-up assessment for mild intermittent asthma mild intermittent asthma    Asthma mild intermittent Recent COVID-19 infection  with residual cough and wheezing Status post Paxlovid treatment Start prednisone  10 mg daily for 5 days Continue to use albuterol  as needed If symptoms persist recommend restarting Arnuity  Allergic rhinitis Continue Zyrtec and intranasal Flonase   Control GERD    CURRENT MEDICATIONS REVIEWED AT LENGTH WITH PATIENT TODAY   Patient  satisfied with Plan of action and management. All questions answered   Follow up 1 year   I spent a total of 42 minutes dedicated to the care of this patient on the date of this encounter to include pre-visit review of records, face-to-face time with the patient discussing conditions above, post visit ordering of testing, clinical documentation with the electronic health record, making appropriate referrals as documented, and communicating necessary information to the patient's healthcare team.    The Patient requires high complexity decision making for assessment and support, frequent evaluation and titration of therapies, application of advanced monitoring technologies and extensive interpretation of multiple databases.  Patient satisfied with Plan of action and management. All questions answered    Nickolas Alm Cellar, M.D.  [facility] Pulmonary & Critical Care Medicine  Medical Director [facility] [place]

## 2024-03-02 ENCOUNTER — Other Ambulatory Visit

## 2024-03-02 DIAGNOSIS — E7800 Pure hypercholesterolemia, unspecified: Secondary | ICD-10-CM

## 2024-03-02 LAB — BASIC METABOLIC PANEL WITH GFR
BUN: 13 mg/dL (ref 6–23)
CO2: 27 meq/L (ref 19–32)
Calcium: 9.1 mg/dL (ref 8.4–10.5)
Chloride: 105 meq/L (ref 96–112)
Creatinine, Ser: 0.87 mg/dL (ref 0.40–1.20)
GFR: 67.88 mL/min (ref >60.00)
Glucose, Bld: 100 mg/dL — ABNORMAL HIGH (ref 70–99)
Potassium: 3.9 meq/L (ref 3.5–5.1)
Sodium: 138 meq/L (ref 135–145)

## 2024-03-02 LAB — LIPID PANEL
Cholesterol: 205 mg/dL — ABNORMAL HIGH (ref 0–200)
HDL: 85.3 mg/dL (ref >39.00)
LDL Cholesterol: 106 mg/dL — ABNORMAL HIGH (ref 0–99)
NonHDL: 120.06
Total CHOL/HDL Ratio: 2
Triglycerides: 69 mg/dL (ref 0.0–149.0)
VLDL: 13.8 mg/dL (ref 0.0–40.0)

## 2024-03-02 LAB — HEPATIC FUNCTION PANEL
ALT: 20 U/L (ref 0–35)
AST: 19 U/L (ref 0–37)
Albumin: 4.3 g/dL (ref 3.5–5.2)
Alkaline Phosphatase: 157 U/L — ABNORMAL HIGH (ref 39–117)
Bilirubin, Direct: 0.1 mg/dL (ref 0.0–0.3)
Total Bilirubin: 0.7 mg/dL (ref 0.2–1.2)
Total Protein: 6.3 g/dL (ref 6.0–8.3)

## 2024-03-04 ENCOUNTER — Ambulatory Visit: Admitting: Internal Medicine

## 2024-03-04 VITALS — BP 136/86 | HR 78 | Temp 97.7°F | Ht 61.0 in | Wt 194.1 lb

## 2024-03-04 DIAGNOSIS — K219 Gastro-esophageal reflux disease without esophagitis: Secondary | ICD-10-CM | POA: Diagnosis not present

## 2024-03-04 DIAGNOSIS — Z23 Encounter for immunization: Secondary | ICD-10-CM

## 2024-03-04 DIAGNOSIS — N3010 Interstitial cystitis (chronic) without hematuria: Secondary | ICD-10-CM

## 2024-03-04 DIAGNOSIS — Z800 Family history of malignant neoplasm of digestive organs: Secondary | ICD-10-CM | POA: Diagnosis not present

## 2024-03-04 DIAGNOSIS — E611 Iron deficiency: Secondary | ICD-10-CM

## 2024-03-04 DIAGNOSIS — R748 Abnormal levels of other serum enzymes: Secondary | ICD-10-CM

## 2024-03-04 DIAGNOSIS — E7800 Pure hypercholesterolemia, unspecified: Secondary | ICD-10-CM

## 2024-03-04 DIAGNOSIS — J4530 Mild persistent asthma, uncomplicated: Secondary | ICD-10-CM

## 2024-03-04 DIAGNOSIS — Z8616 Personal history of COVID-19: Secondary | ICD-10-CM | POA: Diagnosis not present

## 2024-03-04 DIAGNOSIS — Z8349 Family history of other endocrine, nutritional and metabolic diseases: Secondary | ICD-10-CM

## 2024-03-04 MED ORDER — ROSUVASTATIN CALCIUM 5 MG PO TABS: 5.0000 mg | ORAL_TABLET | Freq: Every day | ORAL | 3 refills | Status: AC

## 2024-03-04 MED ORDER — TOPIRAMATE 25 MG PO TABS: 25.0000 mg | ORAL_TABLET | Freq: Every day | ORAL | 3 refills | Status: AC

## 2024-03-04 NOTE — Assessment & Plan Note (Signed)
 The 10-year ASCVD risk score (Arnett DK, et al., 2019) is: 8%   Values used to calculate the score:     Age: 68 years     Clinically relevant sex: Female     Is Non-Hispanic African American: No     Diabetic: No     Tobacco smoker: No     Systolic Blood Pressure: 130 mmHg     Is BP treated: No     HDL Cholesterol: 85.3 mg/dL     Total Cholesterol: 205 mg/dL  Low cholesterol diet and exercise. Discussed calculated cholesterol risk - 8%.  Have previously discussed calcium  score.  Continue diet and exercise. Agreeable to start crestor . Follow lipid panel.

## 2024-03-04 NOTE — Progress Notes (Signed)
 Subjective:    Patient ID: Savannah Gomez, female    DOB: 1955-11-15, 68 y.o.   MRN: [id number]  Patient here for  Chief Complaint  Patient presents with   Medical Management of Chronic Issues    HPI Here for a scheduled follow up - follow up regarding asthma, GERD and hypercholesterolemia. Has had extensive GI w/up as outlined in 10/02/22 note. Capsule endoscopy - negative 06/24/22. Colonoscopy 07/2022 - one 5 mm polyp in the transverse colon, friability with no bleeding in the proximal rectum and non bleeding external and internal hemorrhoids. EGD 12/10/22 - hiatal hernia and gastritis. MRCP 12/02/22 - no evidence of biliary dectal dilatation, choledocholithiasis or other acute finding. Had f/u with GI 03/02/23 - persistent nausea and regurgitation of food despite protonix , pepcid  and carafate . Also reported left side abdominal pain. GI recommended changing protonix  to dexilant  and referral to Dr Tanda for consideration of hernia repair.  Also recommended esophageal manometry Recently evaluated ([facility]) for evlauation hiatal hernia with reflux. Recommended to continue dexilant , pH probe test. Continue sucralfate  and pepcid . Monometry - ok. Had f/u with GI 12/09/23 - recommended to increased miralax and water intake. Trial of nortriptyline . Dicyclomine  prn. Samples of IB gard and FD gard. Continue dexilant . Monitor alk phos levels. On questioning, she did start the nortriptyline . Also taking IB gard. Feels may have helped some. Follow up urology- 11/25/23 - recommended to continue imipramine  and tolterodine . Was diagnosed with covid 12/30/23. Treated with paxlovid . Had f/u with pulmonary 02/02/24 - recommended prednisone  - albuterol  prn. Continue zyrtec and flonase . Breathing overall better. She is using her inaler more than prior to covid - using prn. .    Past Medical History:  Diagnosis Date   Allergy    Arthritis    Asthma    Cataract 06/20/21   Cataracts have removed and lense implants done    Chicken pox    Complication of anesthesia    GERD (gastroesophageal reflux disease)    History of kidney stones    Migraine headache    PONV (postoperative nausea and vomiting)    Urine incontinence    Past Surgical History:  Procedure Laterality Date   BREAST CYST ASPIRATION Bilateral    neg   carpal tunnel sugery     bilateral   CHOLECYSTECTOMY N/A 11/04/2019   Procedure: LAPAROSCOPIC CHOLECYSTECTOMY WITH INTRAOPERATIVE CHOLANGIOGRAM;  Surgeon: Dessa Reyes ORN, MD;  Location: [facility] ORS;  Service: General;  Laterality: N/A;   COLONOSCOPY WITH PROPOFOL  N/A 09/20/2018   Procedure: COLONOSCOPY WITH PROPOFOL ;  Surgeon: Viktoria Lamar DASEN, MD;  Location: [facility] ENDOSCOPY;  Service: Endoscopy;  Laterality: N/A;   ELBOW SURGERY  1999   ESOPHAGEAL MANOMETRY N/A 07/22/2023   Procedure: MANOMETRY, ESOPHAGUS;  Surgeon: Shila Gustav GAILS, MD;  Location: [facility] ENDOSCOPY;  Service: Gastroenterology;  Laterality: N/A;   ESOPHAGOGASTRODUODENOSCOPY (EGD) WITH PROPOFOL  N/A 09/20/2018   Procedure: ESOPHAGOGASTRODUODENOSCOPY (EGD) WITH PROPOFOL ;  Surgeon: Viktoria Lamar DASEN, MD;  Location: [facility] ENDOSCOPY;  Service: Endoscopy;  Laterality: N/A;   EYE SURGERY  3-23   Cateracts removed with lense implants   HAND SURGERY  1999   broken finger - pins   NASAL SINUS SURGERY  1992   ROTATOR CUFF REPAIR  1994   Trigger finger surgery  1999 and 2000   VAGINAL HYSTERECTOMY  1987   secondary to fibroids   Family History  Problem Relation Age of Onset   Colon cancer Mother        colon   Asthma  Mother    Cancer Mother    Heart disease Mother    Hypertension Mother    Heart disease Father    Hypertension Father    Asthma Father    Cancer Father    Colon cancer Sister    Asthma Sister    Cancer Sister    Heart disease Sister    Obesity Sister    Stroke Maternal Grandfather    Diabetes Maternal Grandfather    Lung cancer Maternal Grandmother    Breast cancer Maternal Grandmother    Asthma Maternal  Grandmother    Cancer Maternal Grandmother    Cancer Paternal Grandfather    Cancer Paternal Grandmother    Heart disease Maternal Uncle    Asthma Son    Heart disease Maternal Uncle    Obesity Sister    Miscarriages / Stillbirths Sister    Asthma Son    Esophageal cancer Neg Hx    Pancreatic cancer Neg Hx    Liver disease Neg Hx    Stomach cancer Neg Hx    Social History   Socioeconomic History   Marital status: Married    Spouse name: Not on file   Number of children: Not on file   Years of education: Not on file   Highest education level: 12th grade  Occupational History   Not on file  Tobacco Use   Smoking status: Never   Smokeless tobacco: Never  Vaping Use   Vaping status: Never Used  Substance and Sexual Activity   Alcohol use: No   Drug use: No   Sexual activity: Yes    Birth control/protection: None, Post-menopausal  Other Topics Concern   Not on file  Social History Narrative   Not on file   Social Drivers of Health   Tobacco Use: Low Risk (03/06/2024)   Patient History    Smoking Tobacco Use: Never    Smokeless Tobacco Use: Never    Passive Exposure: Not on file  Financial Resource Strain: Low Risk (01/19/2024)   Overall Financial Resource Strain (CARDIA)    Difficulty of Paying Living Expenses: Not hard at all  Food Insecurity: No Food Insecurity (01/19/2024)   Epic    Worried About Radiation Protection Practitioner of Food in the Last Year: Never true    Ran Out of Food in the Last Year: Never true  Transportation Needs: No Transportation Needs (01/19/2024)   Epic    Lack of Transportation (Medical): No    Lack of Transportation (Non-Medical): No  Physical Activity: Insufficiently Active (01/19/2024)   Exercise Vital Sign    Days of Exercise per Week: 3 days    Minutes of Exercise per Session: 20 min  Stress: No Stress Concern Present (01/19/2024)   [employer] of Occupational Health - Occupational Stress Questionnaire    Feeling of Stress: Only a little   Social Connections: Socially Integrated (01/19/2024)   Social Connection and Isolation Panel    Frequency of Communication with Friends and Family: Twice a week    Frequency of Social Gatherings with Friends and Family: Once a week    Attends Religious Services: More than 4 times per year    Active Member of Clubs or Organizations: Yes    Attends Banker Meetings: More than 4 times per year    Marital Status: Married  Depression (PHQ2-9): Low Risk (03/04/2024)   Depression (PHQ2-9)    PHQ-2 Score: 2  Alcohol Screen: Low Risk (08/24/2023)   Alcohol Screen    Last  Alcohol Screening Score (AUDIT): 0  Housing: Unknown (01/19/2024)   Epic    Unable to Pay for Housing in the Last Year: No    Number of Times Moved in the Last Year: Not on file    Homeless in the Last Year: No  Utilities: Not At Risk (08/24/2023)   AHC Utilities    Threatened with loss of utilities: No  Health Literacy: Adequate Health Literacy (08/24/2023)   B1300 Health Literacy    Frequency of need for help with medical instructions: Never     Review of Systems  Constitutional:  Negative for appetite change and unexpected weight change.  HENT:  Negative for congestion and sinus pressure.   Respiratory:  Negative for cough and chest tightness.        Recent covid infection. Breathing overall better. Requiring her rescue inhlaler more.   Cardiovascular:  Negative for palpitations and leg swelling.       No chest pain with increased activity or exertion.   Gastrointestinal:        Feels GI symptoms have improved some since starting IB gard and nortriptyline .   Genitourinary:  Negative for difficulty urinating and dysuria.  Musculoskeletal:  Negative for joint swelling and myalgias.  Skin:  Negative for color change and rash.  Neurological:  Negative for dizziness and headaches.  Psychiatric/Behavioral:  Negative for agitation and dysphoric mood.        Objective:     BP 136/86   Pulse 78   Temp 97.7  F (36.5 C) (Oral)   Ht 5' 1 (1.549 m)   Wt 194 lb 1.6 oz (88 kg)   SpO2 97%   BMI 36.67 kg/m  Wt Readings from Last 3 Encounters:  03/04/24 194 lb 1.6 oz (88 kg)  02/02/24 193 lb (87.5 kg)  01/20/24 190 lb 3.2 oz (86.3 kg)    Physical Exam Vitals reviewed.  Constitutional:      General: She is not in acute distress.    Appearance: Normal appearance.  HENT:     Head: Normocephalic and atraumatic.     Right Ear: External ear normal.     Left Ear: External ear normal.     Mouth/Throat:     Pharynx: No oropharyngeal exudate or posterior oropharyngeal erythema.  Eyes:     General: No scleral icterus.       Right eye: No discharge.        Left eye: No discharge.     Conjunctiva/sclera: Conjunctivae normal.  Neck:     Thyroid : No thyromegaly.  Cardiovascular:     Rate and Rhythm: Normal rate and regular rhythm.  Pulmonary:     Effort: No respiratory distress.     Breath sounds: Normal breath sounds. No wheezing.  Abdominal:     General: Bowel sounds are normal.     Palpations: Abdomen is soft.     Tenderness: There is no abdominal tenderness.  Musculoskeletal:        General: No swelling or tenderness.     Cervical back: Neck supple. No tenderness.  Lymphadenopathy:     Cervical: No cervical adenopathy.  Skin:    Findings: No erythema or rash.  Neurological:     Mental Status: She is alert.  Psychiatric:        Mood and Affect: Mood normal.        Behavior: Behavior normal.         Outpatient Encounter Medications as of 03/04/2024  Medication Sig   acetaminophen  (  TYLENOL ) 500 MG tablet Take 1,000 mg by mouth daily as needed for mild pain or headache.    albuterol  (VENTOLIN  HFA) 108 (90 Base) MCG/ACT inhaler Inhale 2 puffs into the lungs every 6 (six) hours as needed for wheezing or shortness of breath.   dexlansoprazole  (DEXILANT ) 60 MG capsule Take 1 capsule (60 mg total) by mouth daily.   dicyclomine  (BENTYL ) 10 MG capsule Take 1 capsule (10 mg total) by  mouth 3 (three) times daily as needed for spasms (for abdominal pain and cramping).   famotidine  (PEPCID ) 20 MG tablet Take 20 mg by mouth 2 (two) times daily as needed for heartburn or indigestion.   Fluticasone  Furoate (ARNUITY ELLIPTA ) 100 MCG/ACT AEPB Inhale 1 puff into the lungs daily.   imipramine  (TOFRANIL ) 25 MG tablet Take 1 tablet (25 mg total) by mouth 2 (two) times daily.   nortriptyline  (PAMELOR ) 10 MG capsule Take 1 capsule (10 mg total) by mouth at bedtime.   rosuvastatin  (CRESTOR ) 5 MG tablet Take 1 tablet (5 mg total) by mouth daily.   sucralfate  (CARAFATE ) 1 GM/10ML suspension Take 10 mLs (1 g total) by mouth 4 (four) times daily -  with meals and at bedtime.   tolterodine  (DETROL  LA) 4 MG 24 hr capsule TAKE 1 CAPSULE BY MOUTH DAILY   topiramate  (TOPAMAX ) 25 MG tablet Take 1 tablet (25 mg total) by mouth daily.   [DISCONTINUED] topiramate  (TOPAMAX ) 25 MG tablet Take 1 tablet (25 mg total) by mouth 2 (two) times daily.   [DISCONTINUED] HYDROcodone  bit-homatropine (HYCODAN) 5-1.5 MG/5ML syrup Take 5 mLs by mouth at bedtime as needed for cough.   [DISCONTINUED] predniSONE  (DELTASONE ) 10 MG tablet Take 1 tablet (10 mg total) by mouth daily with breakfast. 5 days   No facility-administered encounter medications on file as of 03/04/2024.     Lab Results  Component Value Date   WBC 4.3 01/30/2023   HGB 13.7 01/30/2023   HCT 40.7 01/30/2023   PLT 250.0 01/30/2023   GLUCOSE 100 (H) 03/02/2024   CHOL 205 (H) 03/02/2024   TRIG 69.0 03/02/2024   HDL 85.30 03/02/2024   LDLCALC 106 (H) 03/02/2024   ALT 20 03/02/2024   AST 19 03/02/2024   NA 138 03/02/2024   K 3.9 03/02/2024   CL 105 03/02/2024   CREATININE 0.87 03/02/2024   BUN 13 03/02/2024   CO2 27 03/02/2024   TSH 2.45 06/01/2023   INR 1.0 11/26/2022   HGBA1C 5.4 10/03/2019    DG Chest 2 View Result Date: 01/21/2024 EXAM: 2 VIEW(S) XRAY OF THE CHEST 01/20/2024 02:03:20 PM COMPARISON: None available. CLINICAL HISTORY:  Persistent cough and shortness of breath status post COVID-19 infection, COVID-19 3 weeks ago, still has a dry cough. History of asthma. Rule out secondary pneumonia. FINDINGS: LUNGS AND PLEURA: No focal pulmonary opacity. No pulmonary edema. No pleural effusion. No pneumothorax. HEART AND MEDIASTINUM: No acute abnormality of the cardiac and mediastinal silhouettes. BONES AND SOFT TISSUES: No acute osseous abnormality. IMPRESSION: 1. No acute cardiopulmonary disease. Electronically signed by: Luke Bun MD 01/21/2024 05:18 PM EDT RP Workstation: [id number]       Assessment & Plan:  Need for influenza vaccination -     Flu vaccine HIGH DOSE PF(Fluzone Trivalent)  Elevated alkaline phosphatase level Assessment & Plan: Seeing GI.   MRCP 12/02/22 - no evidence of biliary dectal dilatation, choledocholithiasis or other acute finding. Continue f/u with GI.  Stable.  Recommended to continue to monitor.    Hypercholesteremia Assessment &  Plan: The 10-year ASCVD risk score (Arnett DK, et al., 2019) is: 8%   Values used to calculate the score:     Age: 38 years     Clinically relevant sex: Female     Is Non-Hispanic African American: No     Diabetic: No     Tobacco smoker: No     Systolic Blood Pressure: 130 mmHg     Is BP treated: No     HDL Cholesterol: 85.3 mg/dL     Total Cholesterol: 205 mg/dL  Low cholesterol diet and exercise. Discussed calculated cholesterol risk - 8%.  Have previously discussed calcium  score.  Continue diet and exercise. Agreeable to start crestor . Follow lipid panel.   Orders: -     Lipid panel; Future -     Basic metabolic panel with GFR; Future -     Hepatic function panel; Future -     TSH; Future -     Hepatic function panel; Future  Iron deficiency -     CBC with Differential/Platelet; Future  Interstitial cystitis Assessment & Plan: Has been evaluated and followed by urology. Follow up urology- 11/25/23 - recommended to continue imipramine  and  tolterodine .    History of COVID-19 Assessment & Plan: Recent covid infection as outlined. Overall feeling better. Breathing better. Continues to need rescue inhaler more frequent. Follow.    Gastroesophageal reflux disease, unspecified whether esophagitis present Assessment & Plan: W/up as outlined. Continues on dexilant .    Family history of hemochromatosis Assessment & Plan: Mother had history of hemochromatosis.  Follow iron studies.    Family history of colon cancer Assessment & Plan: Reviewed.  Colonoscopy 07/2022 - as outlined.    Mild persistent asthma without complication Assessment & Plan: Recent covid infection. Doing better. Still needing rescue inhaler more than prior to covid. Overall improved. Follow.    Other orders -     Topiramate ; Take 1 tablet (25 mg total) by mouth daily.  Dispense: 90 tablet; Refill: 3 -     Rosuvastatin  Calcium ; Take 1 tablet (5 mg total) by mouth daily.  Dispense: 30 tablet; Refill: 3     Allena Hamilton, MD

## 2024-03-06 ENCOUNTER — Encounter: Payer: Self-pay | Admitting: Internal Medicine

## 2024-03-06 NOTE — Assessment & Plan Note (Signed)
 Recent covid infection. Doing better. Still needing rescue inhaler more than prior to covid. Overall improved. Follow.

## 2024-03-06 NOTE — Assessment & Plan Note (Signed)
 W/up as outlined. Continues on dexilant .

## 2024-03-06 NOTE — Assessment & Plan Note (Signed)
 Recent covid infection as outlined. Overall feeling better. Breathing better. Continues to need rescue inhaler more frequent. Follow.

## 2024-03-06 NOTE — Assessment & Plan Note (Addendum)
 Mother had history of hemochromatosis.  Follow iron studies.

## 2024-03-06 NOTE — Assessment & Plan Note (Signed)
 Has been evaluated and followed by urology. Follow up urology- 11/25/23 - recommended to continue imipramine  and tolterodine .

## 2024-03-06 NOTE — Assessment & Plan Note (Signed)
 Reviewed.  Colonoscopy 07/2022 - as outlined.

## 2024-03-06 NOTE — Assessment & Plan Note (Signed)
 Seeing GI.   MRCP 12/02/22 - no evidence of biliary dectal dilatation, choledocholithiasis or other acute finding. Continue f/u with GI.  Stable.  Recommended to continue to monitor.

## 2024-03-07 ENCOUNTER — Other Ambulatory Visit: Payer: Self-pay | Admitting: Gastroenterology

## 2024-04-15 ENCOUNTER — Other Ambulatory Visit

## 2024-04-15 DIAGNOSIS — E7800 Pure hypercholesterolemia, unspecified: Secondary | ICD-10-CM | POA: Diagnosis not present

## 2024-04-15 LAB — HEPATIC FUNCTION PANEL
ALT: 20 U/L (ref 3–35)
AST: 21 U/L (ref 5–37)
Albumin: 4.2 g/dL (ref 3.5–5.2)
Alkaline Phosphatase: 137 U/L — ABNORMAL HIGH (ref 39–117)
Bilirubin, Direct: 0.1 mg/dL (ref 0.1–0.3)
Total Bilirubin: 0.5 mg/dL (ref 0.2–1.2)
Total Protein: 6.4 g/dL (ref 6.0–8.3)

## 2024-04-16 ENCOUNTER — Ambulatory Visit: Payer: Self-pay | Admitting: Internal Medicine

## 2024-07-04 ENCOUNTER — Other Ambulatory Visit

## 2024-07-06 ENCOUNTER — Ambulatory Visit: Admitting: Internal Medicine

## 2024-08-24 ENCOUNTER — Ambulatory Visit

## 2024-11-25 ENCOUNTER — Ambulatory Visit: Admitting: Urology
